# Patient Record
Sex: Male | Born: 1940
Health system: Southern US, Community
[De-identification: ages and names within clinical notes are randomized; demographics above are authoritative.]

## PROBLEM LIST (undated history)

## (undated) DIAGNOSIS — IMO0001 Reserved for inherently not codable concepts without codable children: Secondary | ICD-10-CM

## (undated) DIAGNOSIS — K2100 Gastro-esophageal reflux disease with esophagitis, without bleeding: Secondary | ICD-10-CM

## (undated) DIAGNOSIS — E7800 Pure hypercholesterolemia, unspecified: Secondary | ICD-10-CM

## (undated) DIAGNOSIS — K30 Functional dyspepsia: Secondary | ICD-10-CM

## (undated) DIAGNOSIS — N529 Male erectile dysfunction, unspecified: Secondary | ICD-10-CM

## (undated) DIAGNOSIS — I428 Other cardiomyopathies: Secondary | ICD-10-CM

## (undated) DIAGNOSIS — I447 Left bundle-branch block, unspecified: Principal | ICD-10-CM

## (undated) DIAGNOSIS — M1990 Unspecified osteoarthritis, unspecified site: Secondary | ICD-10-CM

## (undated) DIAGNOSIS — F17200 Nicotine dependence, unspecified, uncomplicated: Secondary | ICD-10-CM

## (undated) DIAGNOSIS — H269 Unspecified cataract: Secondary | ICD-10-CM

## (undated) DIAGNOSIS — N200 Calculus of kidney: Secondary | ICD-10-CM

## (undated) DIAGNOSIS — I5022 Chronic systolic (congestive) heart failure: Secondary | ICD-10-CM

## (undated) DIAGNOSIS — G43909 Migraine, unspecified, not intractable, without status migrainosus: Secondary | ICD-10-CM

## (undated) DIAGNOSIS — C801 Malignant (primary) neoplasm, unspecified: Secondary | ICD-10-CM

## (undated) DIAGNOSIS — T63301A Toxic effect of unspecified spider venom, accidental (unintentional), initial encounter: Secondary | ICD-10-CM

## (undated) DIAGNOSIS — I10 Essential (primary) hypertension: Secondary | ICD-10-CM

## (undated) DIAGNOSIS — C73 Malignant neoplasm of thyroid gland: Secondary | ICD-10-CM

## (undated) DIAGNOSIS — J449 Chronic obstructive pulmonary disease, unspecified: Secondary | ICD-10-CM

## (undated) DIAGNOSIS — E039 Hypothyroidism, unspecified: Secondary | ICD-10-CM

## (undated) DIAGNOSIS — C61 Malignant neoplasm of prostate: Secondary | ICD-10-CM

## (undated) DIAGNOSIS — K219 Gastro-esophageal reflux disease without esophagitis: Secondary | ICD-10-CM

## (undated) DIAGNOSIS — Z5189 Encounter for other specified aftercare: Secondary | ICD-10-CM

## (undated) DIAGNOSIS — I452 Bifascicular block: Secondary | ICD-10-CM

## (undated) HISTORY — DX: Malignant neoplasm of prostate: C61

## (undated) HISTORY — PX: CATARACT EXTRACTION: SUR2

## (undated) HISTORY — DX: Chronic obstructive pulmonary disease, unspecified: J44.9

## (undated) HISTORY — PX: TOTAL KNEE ARTHROPLASTY: SHX125

## (undated) HISTORY — PX: COLONOSCOPY: SHX174

## (undated) HISTORY — DX: Hypothyroidism, unspecified: E03.9

## (undated) HISTORY — PX: ESOPHAGOGASTRODUODENOSCOPY: SHX1529

## (undated) HISTORY — DX: Unspecified osteoarthritis, unspecified site: M19.90

## (undated) HISTORY — PX: THYROIDECTOMY: SHX17

## (undated) HISTORY — PX: NECK SURGERY: SHX720

## (undated) HISTORY — DX: Gastro-esophageal reflux disease with esophagitis: K21.0

## (undated) HISTORY — DX: Male erectile dysfunction, unspecified: N52.9

## (undated) HISTORY — DX: Encounter for other specified aftercare: Z51.89

## (undated) HISTORY — DX: Gastro-esophageal reflux disease without esophagitis: K21.9

## (undated) HISTORY — DX: Unspecified cataract: H26.9

## (undated) HISTORY — PX: NASAL SINUS SURGERY: SHX719

## (undated) HISTORY — DX: Malignant neoplasm of thyroid gland: C73

## (undated) HISTORY — DX: Calculus of kidney: N20.0

## (undated) HISTORY — DX: Left bundle-branch block, unspecified: I44.7

## (undated) HISTORY — DX: Migraine, unspecified, not intractable, without status migrainosus: G43.909

## (undated) HISTORY — DX: Gastro-esophageal reflux disease with esophagitis, without bleeding: K21.00

## (undated) HISTORY — DX: Nicotine dependence, unspecified, uncomplicated: F17.200

## (undated) HISTORY — DX: Bifascicular block: I45.2

## (undated) HISTORY — DX: Essential (primary) hypertension: I10

## (undated) HISTORY — DX: Pure hypercholesterolemia, unspecified: E78.00

## (undated) HISTORY — DX: Chronic systolic (congestive) heart failure: I50.22

## (undated) HISTORY — PX: UPPER GASTROINTESTINAL ENDOSCOPY: SHX188

## (undated) HISTORY — DX: Other cardiomyopathies: I42.8

## (undated) HISTORY — PX: FOOT SURGERY: SHX648

## (undated) HISTORY — PX: PROSTATECTOMY: SHX69

## (undated) HISTORY — PX: CARDIAC CATHETERIZATION: SHX172

## (undated) HISTORY — DX: Malignant (primary) neoplasm, unspecified: C80.1

---

## 1974-10-03 HISTORY — PX: HERNIA REPAIR: SHX51

## 1999-01-13 ENCOUNTER — Other Ambulatory Visit: Admission: RE | Admit: 1999-01-13 | Discharge: 1999-01-13 | Payer: Self-pay | Admitting: *Deleted

## 1999-01-18 ENCOUNTER — Encounter: Admission: RE | Admit: 1999-01-18 | Discharge: 1999-02-22 | Payer: Self-pay | Admitting: Neurosurgery

## 1999-02-17 ENCOUNTER — Other Ambulatory Visit: Admission: RE | Admit: 1999-02-17 | Discharge: 1999-02-17 | Payer: Self-pay | Admitting: *Deleted

## 1999-04-23 ENCOUNTER — Encounter: Payer: Self-pay | Admitting: Surgery

## 1999-04-27 ENCOUNTER — Observation Stay (HOSPITAL_COMMUNITY): Admission: RE | Admit: 1999-04-27 | Discharge: 1999-04-28 | Payer: Self-pay | Admitting: Surgery

## 1999-04-27 ENCOUNTER — Encounter (INDEPENDENT_AMBULATORY_CARE_PROVIDER_SITE_OTHER): Payer: Self-pay

## 2000-02-24 ENCOUNTER — Encounter (INDEPENDENT_AMBULATORY_CARE_PROVIDER_SITE_OTHER): Payer: Self-pay | Admitting: Specialist

## 2000-02-24 ENCOUNTER — Ambulatory Visit (HOSPITAL_COMMUNITY): Admission: RE | Admit: 2000-02-24 | Discharge: 2000-02-24 | Payer: Self-pay | Admitting: *Deleted

## 2000-03-06 ENCOUNTER — Other Ambulatory Visit: Admission: RE | Admit: 2000-03-06 | Discharge: 2000-03-06 | Payer: Self-pay | Admitting: Urology

## 2000-04-17 ENCOUNTER — Encounter: Admission: RE | Admit: 2000-04-17 | Discharge: 2000-07-16 | Payer: Self-pay | Admitting: Radiation Oncology

## 2000-05-08 ENCOUNTER — Encounter: Payer: Self-pay | Admitting: Urology

## 2000-05-10 ENCOUNTER — Encounter (INDEPENDENT_AMBULATORY_CARE_PROVIDER_SITE_OTHER): Payer: Self-pay | Admitting: *Deleted

## 2000-05-10 ENCOUNTER — Inpatient Hospital Stay (HOSPITAL_COMMUNITY): Admission: RE | Admit: 2000-05-10 | Discharge: 2000-05-17 | Payer: Self-pay | Admitting: Urology

## 2000-12-12 ENCOUNTER — Ambulatory Visit (HOSPITAL_COMMUNITY): Admission: RE | Admit: 2000-12-12 | Discharge: 2000-12-12 | Payer: Self-pay | Admitting: *Deleted

## 2000-12-12 ENCOUNTER — Encounter (INDEPENDENT_AMBULATORY_CARE_PROVIDER_SITE_OTHER): Payer: Self-pay

## 2002-09-16 ENCOUNTER — Ambulatory Visit (HOSPITAL_COMMUNITY): Admission: RE | Admit: 2002-09-16 | Discharge: 2002-09-16 | Payer: Self-pay | Admitting: Neurosurgery

## 2003-05-19 ENCOUNTER — Encounter (INDEPENDENT_AMBULATORY_CARE_PROVIDER_SITE_OTHER): Payer: Self-pay | Admitting: Specialist

## 2003-05-19 ENCOUNTER — Ambulatory Visit (HOSPITAL_COMMUNITY): Admission: RE | Admit: 2003-05-19 | Discharge: 2003-05-19 | Payer: Self-pay | Admitting: *Deleted

## 2007-10-04 DIAGNOSIS — I447 Left bundle-branch block, unspecified: Secondary | ICD-10-CM

## 2007-10-04 HISTORY — DX: Left bundle-branch block, unspecified: I44.7

## 2007-11-13 ENCOUNTER — Ambulatory Visit: Payer: Self-pay | Admitting: Cardiovascular Disease

## 2007-11-26 ENCOUNTER — Ambulatory Visit: Payer: Self-pay

## 2007-11-26 ENCOUNTER — Encounter: Payer: Self-pay | Admitting: Cardiovascular Disease

## 2007-12-07 ENCOUNTER — Ambulatory Visit: Payer: Self-pay | Admitting: Cardiovascular Disease

## 2007-12-07 LAB — CONVERTED CEMR LAB
BUN: 15 mg/dL (ref 6–23)
Basophils Absolute: 0 10*3/uL (ref 0.0–0.1)
Basophils Relative: 0.4 % (ref 0.0–1.0)
CO2: 29 meq/L (ref 19–32)
Calcium: 9.5 mg/dL (ref 8.4–10.5)
Chloride: 106 meq/L (ref 96–112)
Creatinine, Ser: 0.8 mg/dL (ref 0.4–1.5)
Eosinophils Absolute: 0.4 10*3/uL (ref 0.0–0.6)
Eosinophils Relative: 4 % (ref 0.0–5.0)
Free T4: 1.4 ng/dL (ref 0.6–1.6)
GFR calc Af Amer: 125 mL/min
GFR calc non Af Amer: 103 mL/min
Glucose, Bld: 92 mg/dL (ref 70–99)
HCT: 43.1 % (ref 39.0–52.0)
Hemoglobin: 14.4 g/dL (ref 13.0–17.0)
INR: 0.9 (ref 0.8–1.0)
Lymphocytes Relative: 30.9 % (ref 12.0–46.0)
MCHC: 33.4 g/dL (ref 30.0–36.0)
MCV: 89.9 fL (ref 78.0–100.0)
Monocytes Absolute: 0.9 10*3/uL — ABNORMAL HIGH (ref 0.2–0.7)
Monocytes Relative: 8.5 % (ref 3.0–11.0)
Neutro Abs: 5.7 10*3/uL (ref 1.4–7.7)
Neutrophils Relative %: 56.2 % (ref 43.0–77.0)
PSA: 0.01 ng/mL — ABNORMAL LOW (ref 0.10–4.00)
Platelets: 236 10*3/uL (ref 150–400)
Potassium: 3.7 meq/L (ref 3.5–5.1)
Prothrombin Time: 11.5 s (ref 10.9–13.3)
RBC: 4.8 M/uL (ref 4.22–5.81)
RDW: 12.1 % (ref 11.5–14.6)
Sodium: 142 meq/L (ref 135–145)
TSH: 0.72 microintl units/mL (ref 0.35–5.50)
WBC: 10.2 10*3/uL (ref 4.5–10.5)
aPTT: 26.8 s (ref 21.7–29.8)

## 2007-12-13 ENCOUNTER — Ambulatory Visit: Payer: Self-pay | Admitting: Cardiovascular Disease

## 2007-12-13 ENCOUNTER — Inpatient Hospital Stay (HOSPITAL_BASED_OUTPATIENT_CLINIC_OR_DEPARTMENT_OTHER): Admission: RE | Admit: 2007-12-13 | Discharge: 2007-12-13 | Payer: Self-pay | Admitting: Cardiovascular Disease

## 2008-01-01 ENCOUNTER — Ambulatory Visit: Payer: Self-pay | Admitting: Cardiovascular Disease

## 2008-07-17 ENCOUNTER — Ambulatory Visit: Payer: Self-pay | Admitting: Cardiovascular Disease

## 2008-12-30 DIAGNOSIS — I10 Essential (primary) hypertension: Secondary | ICD-10-CM | POA: Insufficient documentation

## 2008-12-30 DIAGNOSIS — R0602 Shortness of breath: Secondary | ICD-10-CM

## 2008-12-30 DIAGNOSIS — R918 Other nonspecific abnormal finding of lung field: Secondary | ICD-10-CM

## 2008-12-30 DIAGNOSIS — M129 Arthropathy, unspecified: Secondary | ICD-10-CM | POA: Insufficient documentation

## 2008-12-30 DIAGNOSIS — R0600 Dyspnea, unspecified: Secondary | ICD-10-CM | POA: Insufficient documentation

## 2008-12-30 DIAGNOSIS — E7800 Pure hypercholesterolemia, unspecified: Secondary | ICD-10-CM | POA: Insufficient documentation

## 2008-12-30 DIAGNOSIS — I447 Left bundle-branch block, unspecified: Secondary | ICD-10-CM

## 2008-12-30 DIAGNOSIS — E039 Hypothyroidism, unspecified: Secondary | ICD-10-CM | POA: Insufficient documentation

## 2008-12-30 DIAGNOSIS — I428 Other cardiomyopathies: Secondary | ICD-10-CM

## 2009-01-07 ENCOUNTER — Encounter: Payer: Self-pay | Admitting: Cardiovascular Disease

## 2009-01-07 ENCOUNTER — Ambulatory Visit: Payer: Self-pay | Admitting: Cardiovascular Disease

## 2009-01-20 ENCOUNTER — Ambulatory Visit: Payer: Self-pay

## 2009-01-20 ENCOUNTER — Encounter: Payer: Self-pay | Admitting: Cardiovascular Disease

## 2009-01-22 ENCOUNTER — Ambulatory Visit: Payer: Self-pay | Admitting: Cardiovascular Disease

## 2009-01-22 ENCOUNTER — Observation Stay (HOSPITAL_COMMUNITY): Admission: EM | Admit: 2009-01-22 | Discharge: 2009-01-23 | Payer: Self-pay | Admitting: Emergency Medicine

## 2009-02-04 ENCOUNTER — Encounter (INDEPENDENT_AMBULATORY_CARE_PROVIDER_SITE_OTHER): Payer: Self-pay | Admitting: *Deleted

## 2009-07-08 ENCOUNTER — Ambulatory Visit (HOSPITAL_COMMUNITY): Admission: RE | Admit: 2009-07-08 | Discharge: 2009-07-08 | Payer: Self-pay | Admitting: Orthopedic Surgery

## 2009-11-26 ENCOUNTER — Encounter (INDEPENDENT_AMBULATORY_CARE_PROVIDER_SITE_OTHER): Payer: Self-pay | Admitting: *Deleted

## 2009-12-14 ENCOUNTER — Encounter (INDEPENDENT_AMBULATORY_CARE_PROVIDER_SITE_OTHER): Payer: Self-pay | Admitting: *Deleted

## 2009-12-15 ENCOUNTER — Ambulatory Visit: Payer: Self-pay | Admitting: Internal Medicine

## 2009-12-24 ENCOUNTER — Encounter: Payer: Self-pay | Admitting: Cardiovascular Disease

## 2009-12-29 ENCOUNTER — Encounter: Payer: Self-pay | Admitting: Cardiovascular Disease

## 2010-01-04 ENCOUNTER — Ambulatory Visit: Payer: Self-pay | Admitting: Internal Medicine

## 2010-01-06 ENCOUNTER — Inpatient Hospital Stay (HOSPITAL_COMMUNITY): Admission: RE | Admit: 2010-01-06 | Discharge: 2010-01-08 | Payer: Self-pay | Admitting: Orthopedic Surgery

## 2010-01-08 ENCOUNTER — Encounter: Payer: Self-pay | Admitting: Internal Medicine

## 2010-01-11 ENCOUNTER — Encounter (INDEPENDENT_AMBULATORY_CARE_PROVIDER_SITE_OTHER): Payer: Self-pay | Admitting: Orthopaedic Surgery

## 2010-01-11 ENCOUNTER — Ambulatory Visit: Payer: Self-pay | Admitting: Vascular Surgery

## 2010-01-11 ENCOUNTER — Ambulatory Visit
Admission: RE | Admit: 2010-01-11 | Discharge: 2010-01-11 | Payer: Self-pay | Source: Home / Self Care | Admitting: Orthopaedic Surgery

## 2010-11-04 NOTE — Miscellaneous (Signed)
Summary: [facility] Previsit/prep  Clinical Lists Changes  Medications: Added new medication of MOVIPREP 100 GM  SOLR (PEG-KCL-NACL-NASULF-NA ASC-C) As per prep instructions. - Signed Rx of MOVIPREP 100 GM  SOLR (PEG-KCL-NACL-NASULF-NA ASC-C) As per prep instructions.;  #1 x 0;  Signed;  Entered by: Wyona Almas RN;  Authorized by: Iva Boop MD, [facility];  Method used: Electronically to [facility]. #[id number]*, [address], [place], [place], [place]  [address], Ph: [phone], Fax: [phone] Observations: Added new observation of NKA: T (12/15/2009 8:47)    Prescriptions: MOVIPREP 100 GM  SOLR (PEG-KCL-NACL-NASULF-NA ASC-C) As per prep instructions.  #1 x 0   Entered by:   Wyona Almas RN   Authorized by:   Iva Boop MD, [facility]   Signed by:   Wyona Almas RN on 12/15/2009   Method used:   Electronically to        [facility]. [phone]* (retail)       [address]       [place], [place]  [address]       Ph: [phone]       Fax: [phone]   RxID:   [id number]

## 2010-11-04 NOTE — Letter (Signed)
Summary: [facility] Instructions  [facility]  [address] [place], [place] [address]   Phone: [phone]  Fax: [phone]       Arthur [name]    1941/09/13    MRN: [id number]        Procedure Day Dorna Bloom:  Duanne Limerick  01/04/10     Arrival Time:  7:30AM     Procedure Time:  8:30AM     Location of Procedure:                    Juliann Pares _  [facility] (4th Floor)                        PREPARATION FOR COLONOSCOPY WITH MOVIPREP   Starting 5 days prior to your procedure 12/30/09 do not eat nuts, seeds, popcorn, corn, beans, peas,  salads, or any raw vegetables.  Do not take any fiber supplements (e.g. Metamucil, Citrucel, and Benefiber).  THE DAY BEFORE YOUR PROCEDURE         DATE: 01/03/10  DAY: SUNDAY  1.  Drink clear liquids the entire day-NO SOLID FOOD  2.  Do not drink anything colored red or purple.  Avoid juices with pulp.  No orange juice.  3.  Drink at least 64 oz. (8 glasses) of fluid/clear liquids during the day to prevent dehydration and help the prep work efficiently.  CLEAR LIQUIDS INCLUDE: Water Jello Ice Popsicles Tea (sugar ok, no milk/cream) Powdered fruit flavored drinks Coffee (sugar ok, no milk/cream) Gatorade Juice: apple, white grape, white cranberry  Lemonade Clear bullion, consomm, broth Carbonated beverages (any kind) Strained chicken noodle soup Hard Candy                             4.  In the morning, mix first dose of MoviPrep solution:    Empty 1 Pouch A and 1 Pouch B into the disposable container    Add lukewarm drinking water to the top line of the container. Mix to dissolve    Refrigerate (mixed solution should be used within 24 hrs)  5.  Begin drinking the prep at 5:00 p.m. The MoviPrep container is divided by 4 marks.   Every 15 minutes drink the solution down to the next mark (approximately 8 oz) until the full liter is complete.   6.  Follow completed prep with 16 oz of clear liquid of your choice (Nothing red  or purple).  Continue to drink clear liquids until bedtime.  7.  Before going to bed, mix second dose of MoviPrep solution:    Empty 1 Pouch A and 1 Pouch B into the disposable container    Add lukewarm drinking water to the top line of the container. Mix to dissolve    Refrigerate  THE DAY OF YOUR PROCEDURE      DATE: 01/04/10  DAY: MONDAY  Beginning at 3:30AM (5 hours before procedure):         1. Every 15 minutes, drink the solution down to the next mark (approx 8 oz) until the full liter is complete.  2. Follow completed prep with 16 oz. of clear liquid of your choice.    3. You may drink clear liquids until 6:30AM (2 HOURS BEFORE PROCEDURE).   MEDICATION INSTRUCTIONS  Unless otherwise instructed, you should take regular prescription medications with a small sip of water   as early as possible the morning  of your procedure.         OTHER INSTRUCTIONS  You will need a responsible adult at least 69 years of age to accompany you and drive you home.   This person must remain in the waiting room during your procedure.  Wear loose fitting clothing that is easily removed.  Leave jewelry and other valuables at home.  However, you may wish to bring a book to read or  an iPod/MP3 player to listen to music as you wait for your procedure to start.  Remove all body piercing jewelry and leave at home.  Total time from sign-in until discharge is approximately 2-3 hours.  You should go home directly after your procedure and rest.  You can resume normal activities the  day after your procedure.  The day of your procedure you should not:   Drive   Make legal decisions   Operate machinery   Drink alcohol   Return to work  You will receive specific instructions about eating, activities and medications before you leave.    The above instructions have been reviewed and explained to me by   Wyona Almas RN  December 15, 2009 9:34 AM    I fully understand and can  verbalize these instructions _____________________________ Date _________

## 2010-11-04 NOTE — Letter (Signed)
Summary: Previsit letter  [facility]  [address] [place], [place] [address]   Phone: [phone]  Fax: [phone]       11/26/2009 MRN: [id number]  Arthur [name] [address] [place], [place]  [address]  Dear Arthur [name],  Welcome to the Gastroenterology Division at [facility].    You are scheduled to see a nurse for your pre-procedure visit on 12-15-09 at 9:00a.m. on the 3rd floor at [facility], [address]  We ask that you try to arrive at our office 15 minutes prior to your appointment time to allow for check-in.  Your nurse visit will consist of discussing your medical and surgical history, your immediate family medical history, and your medications.    Please bring a complete list of all your medications or, if you prefer, bring the medication bottles and we will list them.  We will need to be aware of both prescribed and over the counter drugs.  We will need to know exact dosage information as well.  If you are on blood thinners (Coumadin, Plavix, Aggrenox, Ticlid, etc.) please call our office today/prior to your appointment, as we need to consult with your physician about holding your medication.   Please be prepared to read and sign documents such as consent forms, a financial agreement, and acknowledgement forms.  If necessary, and with your consent, a friend or relative is welcome to sit-in on the nurse visit with you.  Please bring your insurance card so that we may make a copy of it.  If your insurance requires a referral to see a specialist, please bring your referral form from your primary care physician.  No co-pay is required for this nurse visit.     If you cannot keep your appointment, please call [phone] to cancel or reschedule prior to your appointment date.  This allows [place] the opportunity to schedule an appointment for another patient in need of care.    Thank you for choosing [facility] for your medical needs.   We appreciate the opportunity to care for you.  Please visit [place] at our website  to learn more about our practice.                     Sincerely.                                                                                                                   The Gastroenterology Division

## 2010-11-04 NOTE — Letter (Signed)
Summary: [facility] Surgical Clearance  [facility] Surgical Clearance   Imported By: Roderic Ovens 03/09/2010 12:19:43  _____________________________________________________________________  External Attachment:    Type:   Image     Comment:   External Document

## 2010-11-04 NOTE — Letter (Signed)
Summary: Patient Notice- Polyp Results  [facility]  [address] [place], [place] [address]   Phone: [phone]  Fax: [phone]        January 08, 2010 MRN: [id number]    THAINE GARRIGA [address] [place], [place]  [address]    Dear Mr. MUNYON,  The polyps removed from your colon were adenomatous. This means that they were pre-cancerous or that  they had the potential to change into cancer over time.  I recommend that you have a repeat colonoscopy in 5 years to determine if you have developed any new polyps over time. If you develop any new rectal bleeding, abdominal pain or significant bowel habit changes, please contact us before then.  In addition to repeating colonoscopy, changing health habits may reduce your risk of having more colon polyps and possibly, colon cancer. You may lower your risk of future polyps and colon cancer by adopting healthy habits such as not smoking or using tobacco (if you do), being physically active, losing weight (if overweight), and eating a diet which includes fruits and vegetables and limits red meat.  Please call us if you are having persistent problems or have questions about your condition that have not been fully answered at this time.  Sincerely,  Iva Boop MD, [facility]  This letter has been electronically signed by your physician.  Appended Document: Patient Notice- Polyp Results letter mailed 4.11.11

## 2010-11-04 NOTE — Letter (Signed)
Summary: [facility] Office Note  [facility]stron[facility] Office Note   Imported By: Roderic Ovens 01/11/2010 13:46:20  _____________________________________________________________________  External Attachment:    Type:   Image     Comment:   External Document

## 2010-11-04 NOTE — Procedures (Signed)
Summary: Colonoscopy  Patient: Arthur [name] Note: All result statuses are Final unless otherwise noted.  Tests: (1) Colonoscopy (COL)   COL Colonoscopy           DONE     [facility]     [address]     [place], [place]  [address]           COLONOSCOPY PROCEDURE REPORT           PATIENT:  [name], [name]  MR#:  [id number]     BIRTHDATE:  [birth year], 68 yrs. old  GENDER:  male     ENDOSCOPIST:  Iva Boop, MD, [facility]     REF. BY:  Robert Bellow, M.D.     PROCEDURE DATE:  01/04/2010     PROCEDURE:  Colonoscopy with biopsy and snare polypectomy     ASA CLASS:  Class II     INDICATIONS:  surveillance and screening, history of pre-cancerous     (adenomatous) colon polyps diminutive adenoma removed 2000     no adenomas 2004     MEDICATIONS:   Fentanyl 75 mcg IV, Versed 7 mg           DESCRIPTION OF PROCEDURE:   After the risks benefits and     alternatives of the procedure were thoroughly explained, informed     consent was obtained.  Digital rectal exam was performed and     revealed that the prostate was surgically absent. and no rectal     masses.   The LB CF-H180AL [id number] endoscope was introduced     through the anus and advanced to the cecum, which was identified     by both the appendix and ileocecal valve, without limitations.     The quality of the prep was excellent, using MoviPrep.  The     instrument was then slowly withdrawn as the colon was fully     examined. Insertion: 2:27 minutes Withdrawal: 15:23 minutes     <<PROCEDUREIMAGES>>           FINDINGS:  Three polyps were found. A 3 mm hepatic flexure polyp,     7-8 mm splenic flexyre polyp and a 3 mm rectal polyp. The 3 mm     polyps were removed using cold biopsy forceps. The 7-8 mm polyp     was snared, then cauterized with monopolar cautery. Retrieval was     successful.   Moderate diverticulosis was found in the sigmoid     colon.  This was otherwise a normal examination of the colon.     Retroflexed  views in the rectum revealed internal hemorrhoids.     The scope was then withdrawn from the patient and the procedure     completed.           COMPLICATIONS:  None     ENDOSCOPIC IMPRESSION:     1) Three polyps removed (minimum 3 mm maximum 7-8 mm)     2) Moderate diverticulosis in the sigmoid colon     3) Internal hemorrhoids     4) Otherwise normal examination, excellent prep           5) Prior adenoma removal 2000, brother had colon cancer in his     48's           RECOMMENDATIONS:     1) No aspirin or NSAID's for 1 week if possible. Coumadin,     Lovenox, heparin ok     2) Take  this report for Dr. Luiz Blare to review as part of your knee     surgery process.           REPEAT EXAM:  In for Colonoscopy, pending biopsy results.           Iva Boop, MD, [facility]           CC:  Robert Bellow, MD     Jodi Geralds, MD     The Patient           n.     Rosalie Doctor:   Iva Boop at 01/04/2010 09:02 AM           Storm Frisk, [id number]  Note: An exclamation mark (!) indicates a result that was not dispersed into the flowsheet. Document Creation Date: 01/04/2010 9:02 AM _______________________________________________________________________  (1) Order result status: Final Collection or observation date-time: 01/04/2010 08:51 Requested date-time:  Receipt date-time:  Reported date-time:  Referring Physician:   Ordering Physician: Stan Head [phone]) Specimen Source:  Source: Launa Grill Order Number: [id number] Lab site:   Appended Document: Colonoscopy     Procedures Next Due Date:    Colonoscopy: 01/2015

## 2010-12-22 LAB — CBC
HCT: 31.5 % — ABNORMAL LOW (ref 39.0–52.0)
MCV: 91.8 fL (ref 78.0–100.0)
Platelets: 221 10*3/uL (ref 150–400)
RBC: 3.09 MIL/uL — ABNORMAL LOW (ref 4.22–5.81)
RBC: 3.45 MIL/uL — ABNORMAL LOW (ref 4.22–5.81)
WBC: 13.6 10*3/uL — ABNORMAL HIGH (ref 4.0–10.5)
WBC: 15.4 10*3/uL — ABNORMAL HIGH (ref 4.0–10.5)

## 2010-12-22 LAB — BASIC METABOLIC PANEL
BUN: 17 mg/dL (ref 6–23)
GFR calc Af Amer: >60 mL/min (ref >60)
GFR calc non Af Amer: >60 mL/min (ref >60)
Potassium: 3.9 mEq/L (ref 3.5–5.1)

## 2010-12-22 LAB — PROTIME-INR
INR: 1.79 — ABNORMAL HIGH (ref 0.00–1.49)
Prothrombin Time: 14.2 seconds (ref 11.6–15.2)
Prothrombin Time: 20.6 seconds — ABNORMAL HIGH (ref 11.6–15.2)

## 2010-12-24 LAB — TYPE AND SCREEN
ABO/RH(D): A NEG
Antibody Screen: NEGATIVE

## 2010-12-24 LAB — COMPREHENSIVE METABOLIC PANEL
ALT: 17 U/L (ref 0–53)
AST: 17 U/L (ref 0–37)
Calcium: 9.7 mg/dL (ref 8.4–10.5)
GFR calc Af Amer: >60 mL/min (ref >60)
Glucose, Bld: 93 mg/dL (ref 70–99)
Sodium: 140 mEq/L (ref 135–145)
Total Protein: 7.1 g/dL (ref 6.0–8.3)

## 2010-12-24 LAB — DIFFERENTIAL
Eosinophils Absolute: 0.3 10*3/uL (ref 0.0–0.7)
Lymphs Abs: 2.9 10*3/uL (ref 0.7–4.0)
Monocytes Relative: 7 % (ref 3–12)
Neutrophils Relative %: 62 % (ref 43–77)

## 2010-12-24 LAB — CBC
MCHC: 33.7 g/dL (ref 30.0–36.0)
RDW: 12.8 % (ref 11.5–15.5)

## 2010-12-24 LAB — URINALYSIS, ROUTINE W REFLEX MICROSCOPIC
Bilirubin Urine: NEGATIVE
Ketones, ur: NEGATIVE mg/dL
Nitrite: NEGATIVE
Protein, ur: NEGATIVE mg/dL
Urobilinogen, UA: 1 mg/dL (ref 0.0–1.0)

## 2010-12-24 LAB — PROTIME-INR: INR: 0.99 (ref 0.00–1.49)

## 2011-01-07 LAB — CBC
Hemoglobin: 13.6 g/dL (ref 13.0–17.0)
Platelets: 238 10*3/uL (ref 150–400)
RDW: 13.1 % (ref 11.5–15.5)

## 2011-01-07 LAB — BASIC METABOLIC PANEL
BUN: 15 mg/dL (ref 6–23)
Calcium: 9.2 mg/dL (ref 8.4–10.5)
Creatinine, Ser: 0.82 mg/dL (ref 0.4–1.5)
GFR calc non Af Amer: >60 mL/min (ref >60)
Glucose, Bld: 101 mg/dL — ABNORMAL HIGH (ref 70–99)
Sodium: 142 mEq/L (ref 135–145)

## 2011-01-12 LAB — URINALYSIS, MICROSCOPIC ONLY
Bilirubin Urine: NEGATIVE
Hgb urine dipstick: NEGATIVE
Ketones, ur: NEGATIVE mg/dL
Nitrite: NEGATIVE
Protein, ur: NEGATIVE mg/dL
Specific Gravity, Urine: 1.008 (ref 1.005–1.030)
Urobilinogen, UA: 1 mg/dL (ref 0.0–1.0)

## 2011-01-12 LAB — COMPREHENSIVE METABOLIC PANEL
ALT: 14 U/L (ref 0–53)
AST: 14 U/L (ref 0–37)
Albumin: 3.3 g/dL — ABNORMAL LOW (ref 3.5–5.2)
Alkaline Phosphatase: 73 U/L (ref 39–117)
BUN: 14 mg/dL (ref 6–23)
Chloride: 106 mEq/L (ref 96–112)
Potassium: 3.8 mEq/L (ref 3.5–5.1)
Sodium: 140 mEq/L (ref 135–145)
Total Bilirubin: 0.7 mg/dL (ref 0.3–1.2)
Total Protein: 5.9 g/dL — ABNORMAL LOW (ref 6.0–8.3)

## 2011-01-12 LAB — CBC
HCT: 37.2 % — ABNORMAL LOW (ref 39.0–52.0)
Hemoglobin: 13.3 g/dL (ref 13.0–17.0)
Platelets: 193 10*3/uL (ref 150–400)
RBC: 4.3 MIL/uL (ref 4.22–5.81)
RDW: 13 % (ref 11.5–15.5)
WBC: 10.7 10*3/uL — ABNORMAL HIGH (ref 4.0–10.5)
WBC: 7.6 10*3/uL (ref 4.0–10.5)

## 2011-01-12 LAB — PROTIME-INR: INR: 1 (ref 0.00–1.49)

## 2011-01-12 LAB — DIFFERENTIAL
Basophils Absolute: 0 10*3/uL (ref 0.0–0.1)
Basophils Relative: 0 % (ref 0–1)
Eosinophils Absolute: 0.3 10*3/uL (ref 0.0–0.7)
Eosinophils Relative: 4 % (ref 0–5)
Lymphocytes Relative: 19 % (ref 12–46)
Monocytes Absolute: 0.5 10*3/uL (ref 0.1–1.0)
Monocytes Absolute: 0.6 10*3/uL (ref 0.1–1.0)
Monocytes Relative: 5 % (ref 3–12)
Monocytes Relative: 7 % (ref 3–12)
Neutro Abs: 4 10*3/uL (ref 1.7–7.7)
Neutro Abs: 7.9 10*3/uL — ABNORMAL HIGH (ref 1.7–7.7)

## 2011-01-12 LAB — POCT CARDIAC MARKERS
CKMB, poc: <1 ng/mL — ABNORMAL LOW (ref 1.0–8.0)
CKMB, poc: <1 ng/mL — ABNORMAL LOW (ref 1.0–8.0)
Myoglobin, poc: 71.2 ng/mL (ref 12–200)
Troponin i, poc: <0.05 ng/mL (ref 0.00–0.09)

## 2011-01-12 LAB — POCT I-STAT, CHEM 8
BUN: 16 mg/dL (ref 6–23)
Creatinine, Ser: 0.9 mg/dL (ref 0.4–1.5)
Hemoglobin: 13.6 g/dL (ref 13.0–17.0)
Potassium: 3.9 mEq/L (ref 3.5–5.1)
Sodium: 139 mEq/L (ref 135–145)

## 2011-01-12 LAB — CULTURE, BLOOD (SINGLE): Culture: NO GROWTH

## 2011-01-12 LAB — CK TOTAL AND CKMB (NOT AT ARMC)
CK, MB: 1.8 ng/mL (ref 0.3–4.0)
Total CK: 113 U/L (ref 7–232)
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
## 2011-02-15 NOTE — Assessment & Plan Note (Signed)
[facility]                            CARDIOLOGY OFFICE NOTE   NAME:[name], Arthur STGERMAIN                       MRN:          [id number]  DATE:01/01/2008                            DOB:          1941-01-29    Arthur [name] returns today for follow-up.  He had an abnormal Myoview with  evidence of left bundle branch block and cardiomyopathy.  His heart  catheterization showed it was nonischemic.  Filling pressures were fine.  Since I last saw him he unfortunately continues to smoke.  We discussed  this.  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  He did not want to try Chantix.   He needs to get some baseline PFT's at Urgent Care with Dr. Perrin Maltese.  From  a cardiac perspective we will maintain him on lisinopril only.  He does  not need a diuretic or beta blocker at this point particularly since he  does not have coronary disease.   REVIEW OF SYSTEMS:  Remarkable for arthritis particularly in his knees  and shoulder.  He was started on tramadol and has had some stomach upset  with it.  I told him to ask Dr. Perrin Maltese about possible Celebrex or Mobic.   REVIEW OF SYSTEMS:  Otherwise, negative.   MEDICATIONS:  1. Advair 250/50.  2. Levoxyl 200 mcg.  3. Simvastatin 40 a day.  4. Lisinopril 20 a day.  5. Tramadol 2-3 a day.   PHYSICAL EXAMINATION:  GENERAL:  Remarkable for elderly white male in no  distress.  VITAL SIGNS:  Weight 215, blood pressure 128/80, pulse 68, afebrile,  respiratory rate 14.  HEENT:  Unremarkable.  NECK:  Carotids normal without bruit, no lymphadenopathy, thyromegaly,  JVP elevation.  LUNGS:  With clear diaphragmatic motion.  No wheezing.  HEART:  S1, S2 with normal heart sounds.  PMI normal.  ABDOMEN:  Benign.  Bowel sounds positive.  No AAA, no tenderness, no  bruit, no hepatosplenomegaly, hepatic jugular reflux.  Distal pulses are  intact with no edema.  NEUROLOGICAL:  Nonfocal.  SKIN:  Warm and dry.  MUSCULOSKELETAL:  No muscular weakness.  Cath site well-healed without  residual bruit or hematoma.   IMPRESSION:  1. Left bundle branch block.  No evidence of high grade heart block,      no syncope.  2. Nonischemic cardiomyopathy.  Continue ACE inhibitor.  He will      follow up left ventricular function in six months to a year.  3. Hypercholesterolemia.  Continue simvastatin 40 a day.  4. Smoking.  Encourage efforts to stop smoking, possible use of      Nicorette gum as opposed to Chantix.  5. Arthritis.  Switch from tramadol to Mobic or Celebrex per Dr.      Perrin Maltese.  6. Chronic obstructive pulmonary disease.  Continue Advair 250/50.  7. Hypothyroidism.  Continue Synthroid 200 mcg, TSH, T4 in six months.     Arthur [name]. [name], MD, [facility]  Electronically Signed    PCN/MedQ  DD: 01/01/2008  DT: 01/01/2008  Job #:  [id number] 
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
## 2011-02-15 NOTE — Cardiovascular Report (Signed)
NAMECANTON, YEARBY                ACCOUNT NO.:  [id number]   MEDICAL RECORD NO.:  [id number]          PATIENT TYPE:  OIB   LOCATION:  1965                         FACILITY:  [facility]   PHYSICIAN:  Peter C. [name], MD, FACCDATE OF BIRTH:  Jul 05, 1941   DATE OF PROCEDURE:  DATE OF DISCHARGE:                            CARDIAC CATHETERIZATION   INDICATION:  Left bundle branch block, abnormal Myoview suggesting  cardiomyopathy, EF 40%.  The patient is having dyspnea.   Cine catheterization done with 5-French catheter from right femoral  artery and a 7-French catheter from right femoral vein.   Right heart pressures were normal.  Mean right atrial pressure was 12.  RV pressure was 35/14, PA pressure was 28/15, mean pulmonary capillary  wedge pressure was 18.   Aortic pressure was 127/70, LV pressure was 128/19.   RAO ventriculography showed mild global hypokinesis, somewhat worse in  the anterior apex.  EF was about 45%.   Coronary arteriography:  The patient had a large left dominant coronary  circulation.   Left main coronary artery was normal.   The left anterior descending artery was normal.   The first diagonal branch was large and normal, second diagonal branch  was somewhat diminutive and normal.   The patient had a large intermediate branch which was normal.  The  circ  was large and left dominant.  There were 2 low-lying obtuse marginal  branches, PDA and PLA, which were normal.   Right coronary artery was small and nondominant.  It was normal.   IMPRESSION:  The patient would appear to have a left bundle branch block  with nonischemic cardiomyopathy.  He will continue his current medical  therapy, including ACE inhibitor.  His filling pressures are fine.  I  will see him back in the office in 3 to 4 weeks to titrate his  medications.      Noralyn Pick. [name], MD, [facility]  Electronically Signed     PCN/MEDQ  D:  12/13/2007  T:  12/14/2007  Job:  [id number]

## 2011-02-15 NOTE — H&P (Signed)
NAMEBERN, FARE NO.:  [id number]   MEDICAL RECORD NO.:  [id number]          PATIENT TYPE:  OBV   LOCATION:  4736                         FACILITY:  [facility]   PHYSICIAN:  Veverly Fells. Excell Seltzer, MD  DATE OF BIRTH:  19-Apr-1941   DATE OF ADMISSION:  01/22/2009  DATE OF DISCHARGE:                              HISTORY & PHYSICAL   REASON FOR ADMISSION:  Chest pain.   HISTORY OF PRESENT ILLNESS:  [name] is a 70 year old gentleman with  a history of left bundle-branch block and nonischemic cardiomyopathy.  He underwent cardiac catheterization in March 2009 demonstrating mild LV  dysfunction with normal coronary arteries.  He presented to the  emergency room today because of chest pain.  He began feeling poorly  yesterday, as he describes dizziness, weakness, shortness of breath, and  chest discomfort.  He woke up this morning and experienced more  significant chest pain and now that he describes as a tightness.  The  pain is nonradiating.  He has not had similar pains in the past.  At  present, his pain is very mild.  He has been started on IV nitroglycerin  in the emergency room, but it has not affected his chest pain.  His  initial EKG shows his chronic left bundle-branch block.  His initial  cardiac enzymes are negative.   He also complains of nausea and vomiting and it has occurred within the  last 2 days.  He had a drenching sweat last night in the middle of the  night.  He denies chills or fevers.  He denies cough, orthopnea, PND, or  edema.  He notes that he had a spider bite on the back of the right leg  late last week and was started on doxycycline 2 days ago.   PAST MEDICAL HISTORY:  1. Nonischemic cardiomyopathy with LVEF 40-45%.  2. [name].  3. Hypothyroidism.  4. Hernia repair.  5. Previous neck surgery.  6. History of thyroid cancer.   SOCIAL HISTORY:  The patient has been previously married and is a  widower of 3 years.  He  has a grown son.  He is a long-time cigarette  smoker.  He does not drink alcohol.  He is to farm and operate heavy  equipment.   FAMILY HISTORY:  Father died at age 7 of a myocardial infarction.  Mother is still alive.   MEDICATIONS:  1. Simvastatin 40 mg at bedtime.  2. Doxycycline 20 mg b.i.d.  3. Tramadol 50 mg 1-2 every 8 hours as needed.  4. Levothyroxine 200 mcg daily.  5. Lisinopril 20 mg daily.  6. Xanax 0.25 mg q.4 h. p.r.n. anxiety.  7. Advair 250/50 mcg b.i.d.   ALLERGIES:  NKDA.   REVIEW OF SYSTEMS:  A complete 12-point review of systems was performed.  Review of systems was negative except as outlined in the HPI.   PHYSICAL EXAMINATION:  GENERAL:  The patient is alert and oriented.  He  is in no acute distress, but he does appear fatigued.  VITAL SIGNS:  Blood pressure is 120/70,  heart rate 60, respiratory rate  16, weight is 201 pounds.  HEENT:  Normal.  NECK:  Normal carotid upstrokes.  No bruits.  JVP normal.  No  thyromegaly or thyroid nodules.  LUNGS:  Scattered rhonchi with chest has some equal expansion.  CARDIOVASCULAR:  The heart is regular rate and rhythm.  Heart sounds are  distant.  There are no murmurs or gallops.  ABDOMEN:  Soft, nontender.  No organomegaly.  BACK:  No CVA tenderness.  EXTREMITIES:  No clubbing, cyanosis, or edema.  Peripheral pulses are 2+  and equal throughout.  SKIN:  Warm and dry without rash.  NEUROLOGIC:  Cranial nerves II through XII are intact.  Strength is  intact and equal bilaterally.   EKG shows normal sinus rhythm with left bundle-branch block.  Heart rate  is 53 beats per minute.   Chest x-ray shows changes consistent with COPD.  There are no acute  infiltrates.  Point-of-care labs show hemoglobin 13.6.  Sodium 139,  potassium 3.9, glucose 106, chloride 104, BUN 16, creatinine 0.9.  Initial cardiac markers are negative.  White blood cell count 10.7,  platelet count 197,000, 73% neutrophils, 19% lymphocytes, 5%  monocytes,  2% eosinophils.   ASSESSMENT:  A 70 year old gentleman with chest pain and multiple  associated symptoms.  His risk for myocardial infarction is very low in  the setting of normal coronary arteries 1 year ago.  We will rule out  for myocardial infarction with serial cardiac biomarkers.  We will  discontinue IV nitroglycerin.  In the setting of shortness of breath, we  will check a D-dimer to screen for pulmonary embolism.  I think the  chance of this is also low.  It is possible that his symptoms are  related to taking doxycycline, as they started the following morning  after he began taking this medication.  We will hold the medicine and  see how he responds clinically.  We will check a urinalysis and repeat a  CBC with differential in the morning to rule out infection.  It is  possible that he has a viral syndrome based on his myriad of symptoms.   Further plans pending results of further lab work and the patient's  clinical course.  If he improves overnight and further evaluation is  unrevealing, consider early discharge tomorrow.      Veverly Fells. Excell Seltzer, MD  Electronically Signed     MDC/MEDQ  D:  01/22/2009  T:  01/23/2009  Job:  [id number]   cc:   Noralyn Pick. [name], MD, [facility]

## 2011-02-15 NOTE — Discharge Summary (Signed)
Arthur [name], Arthur [name] NO.:  [id number]   MEDICAL RECORD NO.:  [id number]          PATIENT TYPE:  OBV   LOCATION:  4736                         FACILITY:  [facility]   PHYSICIAN:  Jonelle Sidle, MD DATE OF BIRTH:  07-20-1941   DATE OF ADMISSION:  01/22/2009  DATE OF DISCHARGE:  01/23/2009                               DISCHARGE SUMMARY   PRIMARY CARDIOLOGIST:  Theron Arista C. [name], MD, [facility]   DISCHARGE DIAGNOSIS:  Chest pain.   SECONDARY DIAGNOSES:  1. Probable viral gastroenteritis.  2. Nonischemic cardiomyopathy.  3. Hyperlipidemia.  4. Hypothyroidism.  5. History of recent spider bite, which was treated with doxycycline.  6. Status post hernia repair.  7. Status post neck surgery.  8. History of thyroid cancer.  9. Tobacco abuse.   ALLERGIES:  No known drug allergies.   PROCEDURES:  None.   HISTORY OF PRESENT ILLNESS:  A 70 year old male with prior history of  nonischemic cardiomyopathy and chronic left bundle-branch block who was  in his usual state of health until the day prior to admission when he  began to experience dizziness, weakness, dyspnea, and mild chest  discomfort.  He also experienced nausea and vomiting.  Approximately 1-  week prior to admission, he noted a spider bite on the back of his right  leg.  He started on doxycycline by his urgent care physician.  Symptoms  started after initiating doxycycline therapy.  On the morning of  admission, the patient experienced chest pain and tightness and  presented to the [facility] ED secondary to his constellation of  symptoms.  In the ED, his ECG showed chronic left-bundle branch block.  No acute changes.  Lab work was unrevealing.  He was admitted for  observation.   HOSPITAL COURSE:  The patient has had no recurrence of chest discomfort.  He has been hydrated overnight and has been feeling better.  His cardiac  markers are negative and he exhibits no leukocytosis.  At this time,  urinalysis  and blood cultures are pending.  The patient wishes to go  home and this appears to be reasonable.  We have discontinued  doxycycline therapy as it may be contributing to his symptoms.  We  recommend that he follow up with his urgent care physician in the next  week.   DISCHARGE LABORATORY DATA:  Hemoglobin 13.0, hematocrit 37.2, WBC is  7.6, and platelets 193.  Sodium 140, potassium 3.8, chloride 106, CO2  27, BUN 14, creatinine 0.78, glucose 86, total bilirubin 0.7, alkaline  phosphatase 73, AST 14, ALT 14, total protein 5.9, albumin 3.3, calcium  9.1, CK 113, MB 120, and troponin I is less than 0.01.   DISPOSITION:  The patient will be discharged home today in good  condition.   FOLLOWUP PLANS AND APPOINTMENTS:  We have asked him to follow up with  his primary care Tienna Bienkowski in the next week.  He will follow up with Dr.  [name] as previously scheduled.   DISCHARGE MEDICATIONS:  1. Synthroid 200 mcg daily.  2. Advair 250/50 one puff b.i.d.  3. Lisinopril 20  mg daily.  4. Tramadol 50 mg 1-2 tablets q.8 h. p.r.n.  5. Simvastatin 40 mg at bedtime.  6. Maxalt 10 mg p.r.n. migraine headaches.   OUTSTANDING LAB STUDIES:  UA and blood cultures are pending.   DURATION OF DISCHARGE/ENCOUNTER:  45 minutes including physician time.      Nicolasa Ducking, ANP      Jonelle Sidle, MD  Electronically Signed    CB/MEDQ  D:  01/23/2009  T:  01/24/2009  Job:  [id number]

## 2011-02-15 NOTE — Assessment & Plan Note (Signed)
[facility]                            CARDIOLOGY OFFICE NOTE   NAME:Arthur [name], Arthur [name]                       MRN:          [id number]  DATE:07/17/2008                            DOB:          [birth year]    [name] returns today for followup.  He was initially referred to me  for a left bundle-branch block and dyspnea.  His echocardiogram showed  an EF of 40%.  He subsequently had a heart catheterization which was  performed on December 13, 2007.  At that time, he had no significant  coronary artery disease.  He had a large left dominant coronary  circulation.  Right heart pressures showed a mean right atrial pressure  of 12, RV pressure 35/14, PA pressure 28/50, mean wedge pressure was 18  which appeared to have a nonischemic cardiomyopathy.  He has been doing  well.  He is functional class I.  His dyspnea is related to long-term  smoking.  I again addressed this with him at length.  He is not using  the Nicorette gum and is not motivated to quit.  I explained to him the  long-term deleterious effects in regards to his already significant  COPD, risk for influenza, pneumonia, and vascular disease.   His review of systems is otherwise negative and particularly he has not  had a cough.  His chest x-ray done last year showed no evidence of  cancer.   He is on Advair 250/50, Levoxyl 300 mcg a day, simvastatin 40 a day,  lisinopril 20 a day, tramadol p.r.n.   PHYSICAL EXAMINATION:  GENERAL:  Remarkable for an elderly white male in  no distress.  He does have a bit of a bronchitic voice.  VITAL SIGNS:  Weight is 201, blood pressure 130/60, pulse 60 and  regular, respiratory rate 14, afebrile.  HEENT:  Unremarkable.  NECK:  Carotids are normal without bruit.  No lymphadenopathy,  thyromegaly, JVP elevation.  LUNGS:  No active wheezing.  Good diaphragmatic motion with decreased  air sounds.  S1 and S2.  Normal heart sounds.  PMI normal.  ABDOMEN:  Benign.   Bowel sounds positive.  No AAA.  No bruit.  No  hepatosplenomegaly or hepatojugular reflux.  No tenderness.  EXTREMITIES:  Distal pulses are intact.  No edema.  NEUROLOGIC:  Nonfocal.  SKIN:  Warm and dry.  MUSCULOSKELETAL:  No muscular weakness.   IMPRESSION:  1. Nonischemic myopathy.  Continue current dose of lisinopril.  Does      not appear to be volume overloaded given chronic obstructive      pulmonary disease and baseline resting heart rate in the 60s.  We      will not add beta-blocker.  2. Chronic obstructive pulmonary disease.  Continue to encourage      smoking cessation.  Continue Advair.  3. Hypothyroidism.  Continue Levoxyl, TSH, and T4 in 6 months.  4. Hypertension.  Currently well controlled.  Continue lisinopril.     Noralyn Pick. [name], MD, [facility]  Electronically Signed    PCN/MedQ  DD: 07/17/2008  DT: 07/18/2008  Job #:  [id number] 

## 2011-02-18 NOTE — Procedures (Signed)
[facility]  Patient:    Arthur [name], Arthur [name]                       MRN: [id number] Proc. Date: 12/12/00 Adm. Date:  [id number] Attending:  Sabino Gasser CC:         Jerl Mina, M.D.   Procedure Report  PROCEDURE PERFORMED:  Upper endoscopy.  ENDOSCOPIST:  Sabino Gasser, M.D.  INDICATIONS:  Question of Barretts esophagus seen previously.  ANESTHESIA:  Demerol 70 mg and Versed 7 mg.  DESCRIPTION OF PROCEDURE:  With the patient mildly sedated in the left lateral decubitus position, the Olympus videoscopic endoscope was inserted and passed under direct vision through the esophagus.  The distal esophagus was approached and appeared to be somewhat inflamed, but no clear evidence of Barretts was seen on this exam, but biopsies were taken, nonetheless of areas that conceiveably could be Barretts.  We then entered into the stomach.  The fundus, body, antrum, duodenal bulb, and second portion of the duodenum all appeared normal.  From this point, the endoscope was slowly withdrawn taking circumferential views of the entire duodenal mucosa to the endoscope and pulled back and the stomach placed on retroflexion views from below.  A hiatal hernia was seen as evidenced by an incomplete wrap of the GE junction around the endoscope.  The endoscope was then straightened and withdrawn, taking circumferential views of the entire gastric and subsequently esophageal mucosa, which otherwise appeared normal.  The patients vital signs and pulse oximeter remained stable.  The patient tolerated the procedure well without apparent complications.  FINDINGS:  Changes of esophagitis to the distal esophagus.  Await biopsy report.  PLAN:  The patient will call me for results and follow up with me as an outpatient. DD:  12/12/00 TD:  12/13/00 Job: [id number]

## 2011-02-18 NOTE — Procedures (Signed)
[facility]  Patient:    Arthur [name], Arthur [name]                  MRN: [id number] Adm. Date:  [id number] Disc. Date: [id number] Attending:  Sabino Gasser CC:         Dr. Robert Bellow, [facility]                           Procedure Report  PROCEDURE:  Upper endoscopy.  INDICATIONS:  Reflux esophagitis, heme-positive stools, rule out Barretts esophagus.  ANESTHESIA:  Demerol 75 mg, Versed 8 mg was given intravenously in divided doses.  DESCRIPTION OF PROCEDURE:  With patient mildly sedated in the left lateral decubitus position, the Olympus videoscopic endoscope was inserted in the mouth, passed under direct vision through the esophagus easily.  The distal esophagus was approached, and there were areas of Barretts esophagus seen endoscopically.  These were photographed and biopsied.  We entered into the stomach.  The fundus, body, and antrum showed mild erythema.  This was photographed, especially in the antrum, and biopsied. We entered into the duodenal bulb, second portion of duodenum, both of which appeared normal and were photographed.  From this point, the endoscope was slowly withdrawn, taking circumferential views of the entire duodenal mucosa.  The endoscope then pulled back into the stomach, placed in retroflexion to view the stomach from below, and a hiatal hernia was seen and photographed.  The endoscope was then straightened and pulled back from distal to proximal stomach, taking circumferential views of the remaining gastric and subsequently esophageal mucosa, which otherwise appeared normal.  The patients vital signs and pulse oximeter remained stable.  The patient tolerated the procedure well without apparent complications.  FINDINGS:  Barretts esophagus above a hiatal hernia, biopsied, and erythema of antrum, biopsied.  Await biopsy report.  The patient will call me for results and follow up with me as an outpatient.  Will start  patient on a proton pump inhibitor at this point. DD:  02/24/00 TD:  02/28/00 Job: [id number]

## 2011-02-18 NOTE — Procedures (Signed)
[facility]  Patient:    Arthur [name], Arthur [name]                  MRN: [id number] Proc. Date: 02/24/00 Adm. Date:  [id number] Disc. Date: [id number] Attending:  Sabino Gasser CC:         Dr. Robert Bellow, [facility]                           Procedure Report  PROCEDURE:  Colonoscopy.  INDICATIONS:  Hemoccult positivity, family history of colon cancer in his brother.  ANESTHESIA:  Demerol 50 mg, Versed 5 mg was given intravenously in divided doses.  DESCRIPTION OF PROCEDURE:  With the patient mildly sedated in the lateral decubitus position, the Olympus videoscopic colonoscope was inserted in the rectum after rectal examination was performed, which was unremarkable, and passed under direct vision to the cecum.  Cecum identified by the ileocecal valve and appendiceal orifice, both of which were photographed.  We then entered into the terminal ileum via the ileocecal valve, and this too appeared normal and was photographed.  From this point, the colonoscope was slowly withdrawn, taking circumferential views of the entire colonic mucosa, stopping first in the proximal transverse colon, where a pedunculated polyp was seen, photographed, and using snare cautery technique setting of 20-20 blended current, it was removed and tissue was obtained.  The polyp was obtained in toto for histology.  The endoscope was then further withdrawn, again stopping only at the sigmoid colon area, at which point a second polyp was seen.  It had a wide base, approximately 5 mm in diameter, and it too was removed using hot biopsy forceps and snare cautery technique combined, both with a setting of 20-20 blended current.  The colonoscope was then withdrawn to the rectum, which appeared normal on direct view and showed hemorrhoids on retroflex view. The endoscope was straightened and withdrawn.  The patients vital signs and pulse oximetry remained stable.  The patient  tolerated the procedure well without apparent complications.  FINDINGS:  Polyps as described above, internal hemorrhoids, otherwise unremarkable colonoscope examination to the terminal ileum.  PLAN:  Will have patient call me for results of biopsy reports and follow up with me as an outpatient. DD:  02/24/00 TD:  02/28/00 Job: [id number]

## 2011-02-18 NOTE — Discharge Summary (Signed)
[place]  Patient:    Arthur [name], Arthur [name]                       MRN: [id number] Adm. Date:  [id number] Disc. Date: [id number] Attending:  Monica Becton                           Discharge Summary  HISTORY OF PRESENT ILLNESS:  This is a 70 year old man diagnosed with a Gleason grade 6 adenocarcinoma.  His PSA was 3.3.  Following consultation with me in the office and a radiology consultation, they decided to go ahead with surgery.  The patient otherwise is in good health.  He is a cigarette smoker. He quit about three months ago.  His preoperative chest x-ray suggested COPD. Otherwise basically in good health  ALLERGIES:  No drug allergies.  HOSPITAL COURSE:  He was given a bowel prep preoperatively and came in on May 10, 2000.  He underwent a radical retropubic prostatectomy and lymph node dissection.  Postoperatively, the patient did well for the first two days and then developed some ileus and mild distention.  Then his hemoglobin gradually drifted down.  It got in the 7 range.  At that point, Verl Dicker, M.D., who was covering for me over the weekend gave him three units of packed red blood cells.  His strength improved, although in the interim, he still had a persistent ileus which gradually resolved with time and Dulcolax suppositories.  His JP drain never did drain much the old course.  I removed it on the fifth postoperative day.  Also, he developed some rather impressive scrotal swelling, which I though was third space loss, which hopefully will resolve spontaneous.  He was not ready for discharge, however, until the seventh postoperative day when he felt he was ready to go.  The catheter was draining well.  The incision was healing nicely.  He was eating satisfactorily.  He still had some swelling, which we will treat with scrotal elevation.  I gave him Lasix 20 mg prior to discharge to see if that would bring about some  reduction in the edema, although I think it will take time.  LABORATORY DATA:  His EKG was unremarkable.  His pathology report showed negative lymph nodes.  He had a Gleason grade 6 carcinoma involving both lobes with a positive margin at the left apex.  Other margins were negative for tumor.  His postoperative hemoglobin stabilized at about 9.2, hematocrit 26.8, and white cell count normal at 8900 one day prior to discharge.  We checked his electrolytes postoperatively, which remained normal as did his BUN and creatinine.  FINAL DIAGNOSIS:  Gleason grade 6 adenocarcinoma of the prostate gland, pathologic stage PT IIB with positive ink margin at the left apex.  OPERATION:  Radical retropubic prostatectomy plus pelvic lymph node dissection.  COMPLICATIONS:  1. Postoperative anemia presumably secondary to a pelvic hematoma.  2. Pelvic hematoma.  3. Prolonged postoperative ileus probably secondary to pelvic hematoma.  CONDITION ON DISCHARGE:  Improving.  DISCHARGE MEDICATIONS: 1. Tylox p.r.n. pain. 2. Iron one t.i.d. 3. Levbid for spasm.  No antibiotics for now.  DIET:  Regular.  Force fluids.  ACTIVITY:  Limited.  Scrotal elevation.  Catheter to leg bag.  FOLLOW-UP:  To see me in the office in six days for follow-up and skin staple removal.  They will call me in the meantime for  any fever or increased scrotal swelling. DD:  05/17/00 TD:  05/17/00 Job: [id number]

## 2011-02-18 NOTE — Op Note (Signed)
   NAME:  Arthur [name], Arthur [name]                          ACCOUNT NO.:  [id number]   MEDICAL RECORD NO.:  [id number]                   PATIENT TYPE:  AMB   LOCATION:  ENDO                                 FACILITY:  [facility]   PHYSICIAN:  Georgiana Spinner, M.D.                 DATE OF BIRTH:  [birth year]   DATE OF PROCEDURE:  05/19/2003  DATE OF DISCHARGE:                                 OPERATIVE REPORT   PROCEDURE:  Colonoscopy with polypectomy and biopsy.   ENDOSCOPIST:  Georgiana Spinner, M.D.   INDICATIONS:  Colon polyps.   ANESTHESIA:  Demerol 70 mg, Versed 8 mg.   DESCRIPTION OF PROCEDURE:  With the patient mildly sedated in the left  lateral decubitus position, the Olympus videoscopic colonoscope was inserted  in the rectum and passed under direct vision to the cecum, identified by  ileocecal valve and appendiceal orifice, both of which were photographed.  From this point, the colonoscope was slowly withdrawn taking circumferential  views of the entire colonic mucosa stopping first in the sigmoid colon where  a polyp approximately 1 cm in size was photographed and removed using snare  cautery technique on a setting of 20/20 blended current.  The tissue was  retrieved through suctioning into the endoscope.  We stopped next at  approximately 20 cm from the anal verge at which point another polyp was  seen.  This one was flat, less than 1 cm in size, and it was removed using  hot biopsy forceps technique, again the same 20/20 blended current.  In the  rectum, the endoscope was placed in the retroflexed view to view the anal  canal from above.  Hemorrhoids were seen and photographed.  The endoscope  was straightened and withdrawn.  The patient's vital signs and pulse  oximeter remained stable.  The patient tolerated the procedure well without  apparent complications.   FINDINGS:  1. Internal hemorrhoids.  2. Polyps as described above.   PLAN:  I will have the patient call me for results of  biopsy and follow up  with me as an outpatient.                                               Georgiana Spinner, M.D.    GMO/MEDQ  D:  05/19/2003  T:  05/19/2003  Job:  [id number]   cc:   Jonita Albee, M.D.  [facility]  [address]  [place]  [place] [address]  Fax: [phone]

## 2011-02-18 NOTE — Op Note (Signed)
[place]. [facility]  Patient:    Arthur [name], Arthur [name]                       MRN: [id number] Proc. Date: 05/10/00 Adm. Date:  [id number] Attending:  Monica Becton                           Operative Report  PREOPERATIVE DIAGNOSIS:  Adenocarcinoma of the prostate gland.  POSTOPERATIVE DIAGNOSIS:  Adenocarcinoma of the prostate gland.  OPERATION:  Radical retropubic prostatectomy plus bilateral lymph node pelvic dissection, lymphadenectomy.  SURGEON:  Claudette Laws, M.D.  DESCRIPTION OF PROCEDURE:  The patient was prepped and drapedin a supine position with the kidney rest slightly elevated and the table flexed.  A #20 French 30 cc Foley was placed.  Lower midline incision was made and carried down through the linea alba.  Retroperitoneal space was entered, developed, both iliac veins were identified.  A Buchwalter retractor was brought into the operative field, and exposure was started onthe patients right side.  The lymph nodes along the iliac vein was takendown proximally, and then circumferentially around the vein down to the obturator nerve which was identified and preserved.  There was not much tissue present, we thought no obvious lymphadenopathy.  All bleeders were hemoclipped.  Our attention was then turned to the opposite side where a similar pelvic lymph node dissection was carried out without incident.  Again, the obturator nerve was identified and preserved.  Dissections weresent for permanent examination.  Our attention was then turned to theendopelvic fascia which was opened bilaterally with the cautery.  There was some trouble with some bleeding initially just lateral to the dorsal vein complex which we eventually controlled with suture ligatures of 2-0 Vicryl.  Puboprostatic ligaments were taken down under direct vision, andthen a plane was developed anterior to the urethra and below the dorsal vein complex.  Hohenfelner clamp was passed.   The dorsal vein complex wassecured with two Vicryl sutures, and then a #1 Vicryl on a CT needle.  He had a very acute angle making separation of the apex of the prostate somewhat difficult.  We cut the dorsal vein complex, the bleeding seemed tobe under control at this point.  We then identified a urethra and cut the anterior portion with the knife and then Metzenbaum scissors.  We brought out the Foley catheter with the right angle clamp.  It was clamped proximally.  The distal end was cut and brought out through the anterior urethra.  Right angle clamp was then passed below the posterior urethra which was then cut under direct vision with Metzenbaum scissors.  It was allowed to retract back up under the pubic bone.  We tried to free up the rectourethralis muscle, and we had a difficult time identifying the proper plane at this point.  So then we turned our attention to the bladder neck area where two Alice clamps were placed, and we slowly developed the plane between the bladder neck and the prostate.  We went down around the bladder neck area and then made an incision in the posterior bladder wall, again separating out the posterior bladder wall from the prostate.  At this point we took down the lateral prostatic pedicles with right angle clamp and0 silk tied sutures. Gradually, we were able to free up the prostate and then went back to the apex where we were able to cut through the  rectourethralis muscle, free up the apex, develop a plane posteriorly.  Then the final attachments, the seminal vesicles were identified anteriorly, cross clamped, cut, and secured with 0 silk ties.  Prostate was removed thenwith the seminal vesicles and the ampulla of the vas.  The wound was irrigated with copious amounts of saline. There were a few bleeders which we electrocoagulated.  We had to reconstruct a rather large bladder neck with interrupted and running 2-0 chromic suture. The ureteral orifices  were identified.  Endocarmine was injected, and we intubated the right ureteral orifice since it was very close to the line of incision.  We closed the bladder neck in a tennis racquet fashion to a point where it would admit the tip of our index finger.  We then passed a Grunwald retractor sound, and under direct vision using 2-0 Vicryl on a double-arm needle, put in five sutures, at approximately the 5 and 7 oclock position, 3 and 9 oclock position, and one at the 12 oclock position.  A #20 French 10 cc Foley catheter was placed, and then we finished the anastomosis by passing the other end of the Vicryl inside out through the bladder neck area.  The bladder was then brought down to the urethral stump and tied underdirect vision.  A #10 [place] JP drain was brought out through the patients right lower abdomen, secured to the skin with a 3-0 nylon. Foley catheter seemed to irrigate well.  There was slight leakage, but distraction would stop the leakage.  The arm of the catheter was secured with the silk suture, so the balloon would not inadvertently deflate.  The wound was irrigated with saline.  There appeared to be no active bleeders.  We closed the rectus fascia with a running #1 PDS suture, skin with staples, dry dressing was applied, and the patient was taken back to the recovery room in satisfactory condition.  He received 30 mg of Toradol about 30 minutes before closure.  Estimated blood loss was 1300 cc, and no blood was given in return. He tolerated the procedure well and was taken back to the recovery room in satisfactory condition. DD:  05/10/00 TD:  05/11/00 Job: [id number]

## 2011-02-18 NOTE — H&P (Signed)
[place]. [facility]  Patient:    Arthur [name], Arthur [name]                       MRN: [id number] Adm. Date:  [id number] Attending:  Monica Becton                         History and Physical  HISTORY OF PRESENT ILLNESS:  This is a 70 year old man who presented to our office several weeks ago with an abnormal feeling prostate gland.  His PSA was 3.3 and he was basically asymptomatic.  When I examined him, I thought he had an asymmetrical firm, fixed right lobe which was suspicious for carcinoma.  At that point I recommended an ultrasound and prostate biopsy.  The biopsy was done in the office here on March 06, 2000, which showed two small focci of adenocarcinoma, Vanessa Ralphs grade 6 and also the same finding on the opposite lobe. So clinically, he was a stage PT2A.  PAST MEDICAL HISTORY:  The patient otherwise is basically in good health.  He has a history of cigarette smoking.  He quit about three months ago. Otherwise denies any serious medical problems, possibly some COPD symptoms. No wheezing and no history of pneumonia.  PAST SURGICAL HISTORY:  He has had a thyroidectomy, sinus surgery, and hernia repair in 1976.  ALLERGIES:  No known drug allergies.  MEDICATIONS:  He takes some inhalers for his COPD.  His urine was clear in our office.  FAMILY HISTORY:  Noncontributory.  SOCIAL HISTORY:  He is divorced.  A truck driver.  PHYSICAL EXAMINATION:  GENERAL:  A healthy man in no acute distress.  VITAL SIGNS:  Blood pressure 129/78, pulse 72, weight 221.  HEENT:  Unremarkable.  CHEST:  Clear to auscultation and percussion.  No rales or rhonchi.  HEART:  Regular sinus rhythm with no murmurs.  ABDOMEN:  Benign, nontender, and no masses, and no hernias.  GENITOURINARY:  Normal uncircumcised male.  Normal testicles bilaterally, nontender.  RECTAL:  +2 prostate, asymmetrical, firm, fixed right lobe.  No other anal or rectal lesions.  IMPRESSION:   Clinical stage T2A, Gleeson grade 6, adenocarcinoma of the prostate gland.  DISCUSSION:  The patient was in our office on two different occasions for consultation regarding treatment options for his prostate cancer.  He did see Wynn Banker, M.D. as well on April 17, 2000, for another opinion and he thought that surgery might be the best option for him longterm.  He was in our office again on May 08, 2000, with his ex-wife discussing options.  I gave him a choice of either radiation therapy or surgery, but I thought longterm he might do better with surgery considering how the prostate feels.  We discussed the nature of the radical prostatectomy, how we do it, and complications such as postoperative incontinence, impotence, postoperative wound infection.  We also discussed the fact that he would have to wear a catheter for possibly three weeks postsurgery.  He understands and agrees to the proposed surgery. He was given a bowel prep the day before consisting of clear liquid diet, 8 ounces of magnesium citrate, along with erythromycin, neomycin base, and flagyl. DD:  05/11/00 TD:  05/11/00 Job: [id number]

## 2011-02-18 NOTE — Procedures (Signed)
[facility]  Patient:    BRIGIDO, MERA                       MRN: [id number] Proc. Date: 12/12/00 Adm. Date:  [id number] Attending:  Sabino Gasser CC:         Jerl Mina, M.D.   Procedure Report  PROCEDURE PERFORMED:  Flexible sigmoidoscopy.  ENDOSCOPIST:  Sabino Gasser, M.D.  ANESTHESIA:  Demerol additional 60 mg and Versed 6.5 mg.  DESCRIPTION OF PROCEDURE:  With the patient mildly sedated in the left lateral decubitus position, the Olympus videoscope pediatric colonoscope was inserted into the rectum and passed under direct vision as far as we could, which was just distal to the ileocecal valve, which was photographed.  From this point, the colonoscope was slowly withdrawn taking circumferential views of the entire colonic mucosa and stopping only in the rectum which appeared normal on direct view and showed large internal hemorrhoids, which presumably is the cause of this patients bright red hematochezia.  The endoscope was then straightened and withdrawn through the anal canal, which other than hemorrhoids appeared normal.  The patients vital signs and pulse oximeter remained stable.  The patient tolerated the procedure well without apparent complications.  FINDINGS:  Enlarged hemorrhoids internally.  PLAN:  Will have the patient follow up with me and discuss advice concerning the surgery. DD:  12/12/00 TD:  12/13/00 Job: [id number]

## 2011-06-27 LAB — POCT I-STAT 3, ART BLOOD GAS (G3+)
O2 Saturation: 97
Operator id: 141321
pCO2 arterial: 40.9
pH, Arterial: 7.44
pO2, Arterial: 90

## 2011-06-27 LAB — POCT I-STAT 3, VENOUS BLOOD GAS (G3P V)
Bicarbonate: 27.5 — ABNORMAL HIGH
Operator id: 141321
pCO2, Ven: 46.1
pH, Ven: 7.383 — ABNORMAL HIGH

## 2011-11-03 DIAGNOSIS — M1710 Unilateral primary osteoarthritis, unspecified knee: Secondary | ICD-10-CM | POA: Diagnosis not present

## 2011-11-10 DIAGNOSIS — M1710 Unilateral primary osteoarthritis, unspecified knee: Secondary | ICD-10-CM | POA: Diagnosis not present

## 2011-11-16 ENCOUNTER — Other Ambulatory Visit: Payer: Self-pay | Admitting: Physician Assistant

## 2011-11-16 ENCOUNTER — Other Ambulatory Visit: Payer: Self-pay | Admitting: Internal Medicine

## 2011-11-17 DIAGNOSIS — M1710 Unilateral primary osteoarthritis, unspecified knee: Secondary | ICD-10-CM | POA: Diagnosis not present

## 2011-11-21 ENCOUNTER — Telehealth: Payer: Self-pay

## 2011-11-21 DIAGNOSIS — N4340 Spermatocele of epididymis, unspecified: Secondary | ICD-10-CM | POA: Diagnosis not present

## 2011-11-21 DIAGNOSIS — N393 Stress incontinence (female) (male): Secondary | ICD-10-CM | POA: Diagnosis not present

## 2011-11-21 DIAGNOSIS — Z8546 Personal history of malignant neoplasm of prostate: Secondary | ICD-10-CM | POA: Diagnosis not present

## 2011-11-21 DIAGNOSIS — N529 Male erectile dysfunction, unspecified: Secondary | ICD-10-CM | POA: Diagnosis not present

## 2011-11-21 NOTE — Telephone Encounter (Signed)
.  [facility]   [facility] REQUESTING LAST PSA BE FAXED TO THEM ASAP(PT IN [facility])    BEST FAX # [phone]

## 2011-11-21 NOTE — Telephone Encounter (Signed)
Last PSA faxed to [facility] with confirmation.

## 2011-11-24 DIAGNOSIS — M1710 Unilateral primary osteoarthritis, unspecified knee: Secondary | ICD-10-CM | POA: Diagnosis not present

## 2011-11-25 ENCOUNTER — Encounter: Payer: Self-pay | Admitting: *Deleted

## 2011-11-25 DIAGNOSIS — C801 Malignant (primary) neoplasm, unspecified: Secondary | ICD-10-CM | POA: Insufficient documentation

## 2011-11-25 DIAGNOSIS — J449 Chronic obstructive pulmonary disease, unspecified: Secondary | ICD-10-CM

## 2011-11-25 DIAGNOSIS — K219 Gastro-esophageal reflux disease without esophagitis: Secondary | ICD-10-CM

## 2011-11-25 DIAGNOSIS — N529 Male erectile dysfunction, unspecified: Secondary | ICD-10-CM | POA: Insufficient documentation

## 2011-11-25 DIAGNOSIS — I447 Left bundle-branch block, unspecified: Secondary | ICD-10-CM

## 2011-11-25 DIAGNOSIS — G43909 Migraine, unspecified, not intractable, without status migrainosus: Secondary | ICD-10-CM

## 2011-11-25 DIAGNOSIS — I10 Essential (primary) hypertension: Secondary | ICD-10-CM

## 2011-11-28 ENCOUNTER — Encounter: Payer: Self-pay | Admitting: Internal Medicine

## 2011-11-28 ENCOUNTER — Other Ambulatory Visit: Payer: Self-pay | Admitting: Internal Medicine

## 2011-11-28 ENCOUNTER — Ambulatory Visit (INDEPENDENT_AMBULATORY_CARE_PROVIDER_SITE_OTHER): Payer: Medicare Other | Admitting: Internal Medicine

## 2011-11-28 DIAGNOSIS — N390 Urinary tract infection, site not specified: Secondary | ICD-10-CM

## 2011-11-28 DIAGNOSIS — R1110 Vomiting, unspecified: Secondary | ICD-10-CM | POA: Diagnosis not present

## 2011-11-28 DIAGNOSIS — C61 Malignant neoplasm of prostate: Secondary | ICD-10-CM | POA: Diagnosis not present

## 2011-11-28 DIAGNOSIS — G43909 Migraine, unspecified, not intractable, without status migrainosus: Secondary | ICD-10-CM | POA: Diagnosis not present

## 2011-11-28 DIAGNOSIS — K219 Gastro-esophageal reflux disease without esophagitis: Secondary | ICD-10-CM

## 2011-11-28 DIAGNOSIS — J449 Chronic obstructive pulmonary disease, unspecified: Secondary | ICD-10-CM

## 2011-11-28 DIAGNOSIS — R51 Headache: Secondary | ICD-10-CM | POA: Diagnosis not present

## 2011-11-28 DIAGNOSIS — Z7189 Other specified counseling: Secondary | ICD-10-CM

## 2011-11-28 DIAGNOSIS — E039 Hypothyroidism, unspecified: Secondary | ICD-10-CM

## 2011-11-28 DIAGNOSIS — Z125 Encounter for screening for malignant neoplasm of prostate: Secondary | ICD-10-CM | POA: Diagnosis not present

## 2011-11-28 LAB — POCT CBC
HCT, POC: 45 % (ref 43.5–53.7)
Hemoglobin: 14.4 g/dL (ref 14.1–18.1)
Lymph, poc: 2.5 (ref 0.6–3.4)
MCHC: 32 g/dL (ref 31.8–35.4)
MCV: 90.2 fL (ref 80–97)
POC Granulocyte: 7.5 — AB (ref 2–6.9)
POC LYMPH PERCENT: 23.3 %L (ref 10–50)
WBC: 10.7 10*3/uL — AB (ref 4.6–10.2)

## 2011-11-28 LAB — POCT URINALYSIS DIPSTICK
Bilirubin, UA: NEGATIVE
Ketones, UA: NEGATIVE
Leukocytes, UA: NEGATIVE
Spec Grav, UA: 1.025
pH, UA: 7

## 2011-11-28 LAB — POCT UA - MICROSCOPIC ONLY
Bacteria, U Microscopic: NEGATIVE
Casts, Ur, LPF, POC: NEGATIVE
Mucus, UA: NEGATIVE
WBC, Ur, HPF, POC: NEGATIVE

## 2011-11-28 MED ORDER — ONDANSETRON 4 MG PO TBDP
8.0000 mg | ORAL_TABLET | Freq: Once | ORAL | Status: AC
  Administered 2011-11-28: 8 mg via ORAL

## 2011-11-28 MED ORDER — RANITIDINE HCL 150 MG PO TABS: 150.0000 mg | ORAL_TABLET | Freq: Two times a day (BID) | ORAL | Status: DC

## 2011-11-28 MED ORDER — TRAMADOL HCL 50 MG PO TABS: 50.0000 mg | ORAL_TABLET | Freq: Four times a day (QID) | ORAL | Status: DC | PRN

## 2011-11-28 NOTE — Progress Notes (Signed)
  Subjective:    Patient ID: Arthur [name], male    DOB: 11/03/1940, 71 y.o.   MRN: [id number]  HPI C/o vomiting, heartburn, chest pain. Also c/o freq, dysuria. Ran out of tramadol and ranitidine needs refills   Review of Systems  Constitutional: Positive for activity change and fatigue.  Respiratory: Positive for shortness of breath.   Cardiovascular: Positive for chest pain.  Gastrointestinal: Positive for vomiting.  Genitourinary: Positive for dysuria, frequency and difficulty urinating.  Neurological: Positive for headaches.       Objective:   Physical Exam  Constitutional: He is oriented to person, place, and time. He appears distressed.  Neck: Neck supple.  Cardiovascular: Normal rate and regular rhythm.   Pulmonary/Chest: Effort normal.  Abdominal: He exhibits no mass. There is tenderness. There is no rebound and no guarding.  Neurological: He is alert and oriented to person, place, and time.  Skin: Skin is warm and dry.          Assessment & Plan:   EKG CBC, CCUA  PSA Dr. Aaron Edelman request  Vomiting, Fatigue, GERD will tx sx and f/up closely  Zofan 8mg  prn Hydrate Omeprazole 40mg  for a few days

## 2011-11-29 ENCOUNTER — Encounter: Payer: Self-pay | Admitting: Family Medicine

## 2011-12-01 DIAGNOSIS — M1710 Unilateral primary osteoarthritis, unspecified knee: Secondary | ICD-10-CM | POA: Diagnosis not present

## 2011-12-06 ENCOUNTER — Encounter: Payer: Self-pay | Admitting: *Deleted

## 2011-12-19 ENCOUNTER — Other Ambulatory Visit: Payer: Self-pay | Admitting: Internal Medicine

## 2011-12-28 ENCOUNTER — Ambulatory Visit (INDEPENDENT_AMBULATORY_CARE_PROVIDER_SITE_OTHER): Payer: Medicare Other | Admitting: Emergency Medicine

## 2011-12-28 DIAGNOSIS — R109 Unspecified abdominal pain: Secondary | ICD-10-CM

## 2011-12-28 LAB — COMPREHENSIVE METABOLIC PANEL
Albumin: 4.5 g/dL (ref 3.5–5.2)
BUN: 16 mg/dL (ref 6–23)
CO2: 27 mEq/L (ref 19–32)
Glucose, Bld: 81 mg/dL (ref 70–99)
Potassium: 4.3 mEq/L (ref 3.5–5.3)
Sodium: 140 mEq/L (ref 135–145)
Total Protein: 7.1 g/dL (ref 6.0–8.3)

## 2011-12-28 LAB — AMYLASE: Amylase: 58 U/L (ref 0–105)

## 2011-12-28 MED ORDER — SUCRALFATE 1 GM/10ML PO SUSP: ORAL | Status: DC

## 2011-12-28 MED ORDER — OMEPRAZOLE 20 MG PO CPDR: 20.0000 mg | DELAYED_RELEASE_CAPSULE | Freq: Every day | ORAL | Status: DC

## 2011-12-28 NOTE — Patient Instructions (Signed)

## 2011-12-28 NOTE — Progress Notes (Signed)
  Subjective:    Patient ID: Arthur [name], male    DOB: 1941-10-03, 71 y.o.   MRN: [id number]  HPI patient in with persistent heartburn sensation. He feels something sticking in his throat. From the note reviewed in 2002 there is an issue about possible Barrett's esophagus. He was started on Zantac but continues to have significant symptoms. Denies any chest pain. In 2009 stress test and cardiac catheterization and by report from him these were within normal limits. He does have a baseline left bundle branch block by EKG.    Review of Systems he denies any recent weight loss. Arthur [name] been normal for him. He denies any substernal chest pain.    Objective:   Physical Exam  Constitutional: He appears well-developed and well-nourished.  HENT:  Right Ear: External ear normal.  Left Ear: External ear normal.  Neck: No JVD present. No tracheal deviation present. No thyromegaly present.  Cardiovascular: Normal rate and regular rhythm.   Pulmonary/Chest: Breath sounds normal. No respiratory distress. He has no wheezes.  Abdominal:       The abdomen is flat. There is tenderness to palpation in the midepigastrium which extends slightly to the left towards the spleen. I do not feel mass on exam.  Lymphadenopathy:    He has no cervical adenopathy.          Assessment & Plan:  Patient has had a severe episode of esophagitis. He has a history of Barrett's esophagus in 2002 with no followup. Patient referred to GI for repeat endoscopy.

## 2011-12-29 ENCOUNTER — Encounter: Payer: Self-pay | Admitting: Internal Medicine

## 2011-12-29 ENCOUNTER — Encounter: Payer: Self-pay | Admitting: Gastroenterology

## 2012-01-06 ENCOUNTER — Ambulatory Visit: Payer: Self-pay | Admitting: Gastroenterology

## 2012-01-16 ENCOUNTER — Telehealth: Payer: Self-pay | Admitting: Physician Assistant

## 2012-01-16 MED ORDER — LEVOTHYROXINE SODIUM 200 MCG PO TABS: 200.0000 ug | ORAL_TABLET | Freq: Every day | ORAL | Status: DC

## 2012-01-16 NOTE — Telephone Encounter (Signed)
Fax from pharmacy that Levoxyl 200 mcg is on voluntary recall by the manufacturer, without release date.  They request a different brand or authorization to dispense the generic.  Authorized.

## 2012-01-25 ENCOUNTER — Ambulatory Visit: Payer: Self-pay | Admitting: Gastroenterology

## 2012-01-26 ENCOUNTER — Ambulatory Visit (INDEPENDENT_AMBULATORY_CARE_PROVIDER_SITE_OTHER): Payer: Medicare Other | Admitting: Internal Medicine

## 2012-01-26 ENCOUNTER — Encounter: Payer: Self-pay | Admitting: Internal Medicine

## 2012-01-26 VITALS — BP 120/74 | HR 74 | Ht 71.5 in | Wt 203.6 lb

## 2012-01-26 DIAGNOSIS — K219 Gastro-esophageal reflux disease without esophagitis: Secondary | ICD-10-CM

## 2012-01-26 DIAGNOSIS — R1314 Dysphagia, pharyngoesophageal phase: Secondary | ICD-10-CM

## 2012-01-26 DIAGNOSIS — Z860100 Personal history of colon polyps, unspecified: Secondary | ICD-10-CM

## 2012-01-26 DIAGNOSIS — R1310 Dysphagia, unspecified: Secondary | ICD-10-CM

## 2012-01-26 NOTE — Patient Instructions (Addendum)
You have been given a separate informational sheet regarding your tobacco use, the importance of quitting and local resources to help you quit.  You have been scheduled for an endoscopy. Please follow written instructions given to you at your visit today.  

## 2012-01-26 NOTE — Progress Notes (Signed)
Subjective:    Patient ID: Arthur [name], male    DOB: [birth year], 71 y.o.   MRN: [id number]  Referring M.D.: Lesle Chris, MD  HPI Patient is referred for evaluation of dysphagia. He reports a several month history of intermittent suprasternal intact dysphagia with a more constant sensation of tightness in that area. He had increasing heartburn and regurgitation symptoms. He was started on omeprazole 20 mg daily in March and has noted improvement in the heartburn but still has intermittent dysphagia. He think is probably gained a little bit of weight, there's been no bleeding. There is some epigastric pain off and on as well. He does not recall having problems like this before though he did have an endoscopy about 12 or 13 years ago by another gastroenterologist. He has an associated sore throat. He has some hoarseness which has been chronic since having a thyroidectomy but thinks that his voice does get a bit more raspy lately. The remainder of his GI review of systems is negative.  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 He had quit smoking but returned to it, he says he is trying to quit. 2 caffeinated beverages daily. He has previously been given GERD diet instructions at his primary care visit. Medications, allergies, past medical history, past surgical history, family history and social history are reviewed and updated in the EMR.  Review of Systems Positive for those things mentioned in the history of present illness. He also has intermittent headaches and arthritis pain.    Objective:   Physical Exam General:  Well-developed, well-nourished and in no acute distress Eyes:  anicteric. ENT:   Mouth and posterior pharynx free of lesions. Upper and lower dentures are present. Neck:   supple w/o thyromegaly or mass. Some patchy erythema in the skin of the neck Lungs: Clear to auscultation bilaterally with somewhat decreased  breath sounds and equal inspiratory and expiratory phase Heart:  S1S2, no rubs, murmurs, gallops. Sounds are very distant Abdomen:  soft, non-tender, no hepatosplenomegaly, hernia, or mass and BS+.  Lymph:  no cervical or supraclavicular adenopathy. Extremities:   no edema Skin   neck erythema, some patchy acne-like pustules in the neck and suprasternal area. Neuro:  A&O x 3.  Psych:  appropriate mood and affect.   Data Reviewed: Lab Results  Component Value Date   WBC 10.7* 11/28/2011   HGB 14.4 11/28/2011   HCT 45.0 11/28/2011   MCV 90.2 11/28/2011   PLT 183 01/08/2010    Previous primary care office notes. They indicate apparent history of Barrett's esophagus in 2002. Presumably that's her endoscopy reports. Pathology report review from 2001 2002 does not show any intestinal metaplasia of the esophagus and he may really not have Barrett's esophagus.      Assessment & Plan:   1. Esophageal dysphagia      2. GERD (gastroesophageal reflux disease)    Constellation of heartburn and symptoms compatible cancer such reflux disease, neck tightness with sore throat and globus sensation as well as solid food dysphagia. Sounds like a peptic stricture in the setting of GERD most likely. Esophageal cancer is possible. He is improving on PPI therapy so we'll continue that. He needs an upper endoscopy with probable esophageal dilation.The risks and benefits as well as alternatives of endoscopic procedure(s) have been discussed and reviewed. All questions answered. The patient agrees to proceed. He is trying to quit smoking.  I appreciate the opportunity  to care for this patient.  CC: Lesle Chris, MD

## 2012-02-02 ENCOUNTER — Ambulatory Visit (AMBULATORY_SURGERY_CENTER): Payer: Medicare Other | Admitting: Internal Medicine

## 2012-02-02 ENCOUNTER — Encounter: Payer: Self-pay | Admitting: Internal Medicine

## 2012-02-02 VITALS — BP 135/81 | HR 57 | Temp 97.8°F | Resp 17 | Ht 72.0 in | Wt 203.0 lb

## 2012-02-02 DIAGNOSIS — R1310 Dysphagia, unspecified: Secondary | ICD-10-CM

## 2012-02-02 DIAGNOSIS — K2100 Gastro-esophageal reflux disease with esophagitis, without bleeding: Secondary | ICD-10-CM

## 2012-02-02 DIAGNOSIS — R1319 Other dysphagia: Secondary | ICD-10-CM

## 2012-02-02 DIAGNOSIS — R1314 Dysphagia, pharyngoesophageal phase: Secondary | ICD-10-CM

## 2012-02-02 MED ORDER — SODIUM CHLORIDE 0.9 % IV SOLN: 500.0000 mL | INTRAVENOUS | Status: DC

## 2012-02-02 MED ORDER — PANTOPRAZOLE SODIUM 40 MG PO TBEC: 40.0000 mg | DELAYED_RELEASE_TABLET | Freq: Every day | ORAL | Status: DC

## 2012-02-02 NOTE — Op Note (Addendum)
[facility] [address] [place], [place]  [address]  ENDOSCOPY PROCEDURE REPORT  PATIENT:  Arthur, [name]  MR#:  [id number] BIRTHDATE:  06/28/1941, 70 yrs. old  GENDER:  male  ENDOSCOPIST:  Iva Boop, MD, [facility]  PROCEDURE DATE:  02/02/2012 PROCEDURE:  EGD, diagnostic, Elease Hashimoto Dilation of the Esophagus ASA CLASS:  Class II INDICATIONS:  1) dysphagia  2) heartburn  MEDICATIONS:   These medications were titrated to patient response per physician's verbal order, Fentanyl 50 mcg, Versed 5 mg IV TOPICAL ANESTHETIC:  Cetacaine Spray  DESCRIPTION OF PROCEDURE:   After the risks benefits and alternatives of the procedure were thoroughly explained, informed consent was obtained.  The [id number]-H180 [id number] endoscope was introduced through the mouth and advanced to the second portion of the duodenum, without limitations.  The instrument was slowly withdrawn as the mucosa was carefully examined. <<PROCEDUREIMAGES>>  Multiple erosions were found in the distal esophagus. 40-42 cm (z-line at 42). Several linear erosions up to 2 cm long. LA Grade C.  The examination was otherwise normal.    Dilation was then performed at the total esophagus  1) Dilator:  Elease Hashimoto  Size(s):  54 french Resistance:  minimal  Heme:  none  COMPLICATIONS:  None  ENDOSCOPIC IMPRESSION: 1) Erosions, multiple in the distal esophagus - consistent with reflux esophagitis 2) Otherwise normal examination. RECOMMENDATIONS: 1) post-dilation diet today 2) needs increase in PPI strength - take two 20 mg omeprazole every AM to make 40 mg total dose and I have also sent new prescription for 40 mg daily ADDENDUM: prescription is for pantoprazole 40 mg 3) Maximize lifestyle changes for GERD - see handout provided today 4) return to Dr. Leone Payor in 2 months (early to mid-July to reassess - will need repeat EGD to ensure healing later in 2013  REPEAT EXAM:  later in 2013 once symptoms controlled on  PPI  Iva Boop, MD, [facility]  CC:  Lesle Chris, MD and The Patient  n. REVISED:  02/02/2012 02:13 PM eSIGNED:   Iva Boop at 02/02/2012 02:13 PM  Storm Frisk, [phone]

## 2012-02-02 NOTE — Patient Instructions (Signed)
YOU HAD AN ENDOSCOPIC PROCEDURE TODAY AT THE [facility]: Refer to the procedure report that was given to you for any specific questions about what was found during the examination.  If the procedure report does not answer your questions, please call your gastroenterologist to clarify.  If you requested that your care partner not be given the details of your procedure findings, then the procedure report has been included in a sealed envelope for you to review at your convenience later.  YOU SHOULD EXPECT: Some feelings of bloating in the abdomen. Passage of more gas than usual.  Walking can help get rid of the air that was put into your GI tract during the procedure and reduce the bloating. If you had a lower endoscopy (such as a colonoscopy or flexible sigmoidoscopy) you may notice spotting of blood in your stool or on the toilet paper. If you underwent a bowel prep for your procedure, then you may not have a normal bowel movement for a few days.  DIET: Your first meal following the procedure should be a light meal and then it is ok to progress to your normal diet.  A half-sandwich or bowl of soup is an example of a good first meal.  Heavy or fried foods are harder to digest and may make you feel nauseous or bloated.  Likewise meals heavy in dairy and vegetables can cause extra gas to form and this can also increase the bloating.  Drink plenty of fluids but you should avoid alcoholic beverages for 24 hours.  ACTIVITY: Your care partner should take you home directly after the procedure.  You should plan to take it easy, moving slowly for the rest of the day.  You can resume normal activity the day after the procedure however you should NOT DRIVE or use heavy machinery for 24 hours (because of the sedation medicines used during the test).    SYMPTOMS TO REPORT IMMEDIATELY: A gastroenterologist can be reached at any hour.  During normal business hours, 8:30 AM to 5:00 PM Monday through Friday,  call [phone].  After hours and on weekends, please call the GI answering service at [phone] who will take a message and have the physician on call contact you.   Following upper endoscopy (EGD)  Vomiting of blood or coffee ground material  New chest pain or pain under the shoulder blades  Painful or persistently difficult swallowing  New shortness of breath  Fever of 100F or higher  Black, tarry-looking stools  FOLLOW UP: If any biopsies were taken you will be contacted by phone or by letter within the next 1-3 weeks.  Call your gastroenterologist if you have not heard about the biopsies in 3 weeks.  Our staff will call the home number listed on your records the next business day following your procedure to check on you and address any questions or concerns that you may have at that time regarding the information given to you following your procedure. This is a courtesy call and so if there is no answer at the home number and we have not heard from you through the emergency physician on call, we will assume that you have returned to your regular daily activities without incident.  SIGNATURES/CONFIDENTIALITY: You and/or your care partner have signed paperwork which will be entered into your electronic medical record.  These signatures attest to the fact that that the information above on your After Visit Summary has been reviewed and is understood.  Full responsibility   of the confidentiality of this discharge information lies with you and/or your care-partner.    DILATION DIET TODAY AS DIRECTED. RESUME YOUR REGULAR DIET TOMORROW PLEASE  DR Leone Payor INCREASED YOUR PPI MEDICINE STRENGTH. TAKE 2 20 MG OMEPRAZOLE EVERY MORNING TO MAKE 40 MG TOTAL DOSE. DR Leone Payor HAS SENT A NEW PRESCRIPTION FOR 40 MG OMEPRAZOLE DAILY. RETURN TO DR Leone Payor IN 2 MONTHS. PLEASE CALL [phone] TO SCHEDULE THIS APPOINTMENT IN THE NEXT 2-3 DAYS. YOU WILL NEED A REPEAT ENDOSCOPY LATER IN 2013 TO ENSURE HEALING  ONCE SYMPTOMS CONTROLLED ON PPI.

## 2012-02-02 NOTE — Progress Notes (Signed)
The pt tolerated the egd with dilatation very well. Maw   

## 2012-02-02 NOTE — Progress Notes (Signed)
Patient did not experience any of the following events: a burn prior to discharge; a fall within the facility; wrong site/side/patient/procedure/implant event; or a hospital transfer or hospital admission upon discharge from the facility. (G8907) Patient did not have preoperative order for IV antibiotic SSI prophylaxis. (G8918)  

## 2012-02-03 ENCOUNTER — Telehealth: Payer: Self-pay

## 2012-02-03 NOTE — Telephone Encounter (Signed)
No answer.  Did not give permission to leave message.

## 2012-02-20 ENCOUNTER — Ambulatory Visit (INDEPENDENT_AMBULATORY_CARE_PROVIDER_SITE_OTHER): Payer: PRIVATE HEALTH INSURANCE | Admitting: Internal Medicine

## 2012-02-20 ENCOUNTER — Encounter: Payer: Self-pay | Admitting: Internal Medicine

## 2012-02-20 VITALS — BP 123/71 | HR 69 | Temp 97.3°F | Resp 16 | Ht 71.0 in | Wt 203.0 lb

## 2012-02-20 DIAGNOSIS — C73 Malignant neoplasm of thyroid gland: Secondary | ICD-10-CM

## 2012-02-20 DIAGNOSIS — J029 Acute pharyngitis, unspecified: Secondary | ICD-10-CM | POA: Diagnosis not present

## 2012-02-20 DIAGNOSIS — Z7189 Other specified counseling: Secondary | ICD-10-CM | POA: Diagnosis not present

## 2012-02-20 DIAGNOSIS — G43709 Chronic migraine without aura, not intractable, without status migrainosus: Secondary | ICD-10-CM

## 2012-02-20 DIAGNOSIS — K219 Gastro-esophageal reflux disease without esophagitis: Secondary | ICD-10-CM

## 2012-02-20 DIAGNOSIS — E039 Hypothyroidism, unspecified: Secondary | ICD-10-CM | POA: Diagnosis not present

## 2012-02-20 DIAGNOSIS — C61 Malignant neoplasm of prostate: Secondary | ICD-10-CM

## 2012-02-20 MED ORDER — AMOXICILLIN 500 MG PO CAPS: 1000.0000 mg | ORAL_CAPSULE | Freq: Two times a day (BID) | ORAL | Status: AC

## 2012-02-20 NOTE — Progress Notes (Signed)
  Subjective:    Patient ID: Arthur [name], male    DOB: Dec 22, 1940, 71 y.o.   MRN: [id number]  HPI Has low thyroid, needs adjustment meds, TSH. Genella Rife is worse see Dr. Leone Payor workup. Chronic pain is better control now. Migrains about 1-2 month, no prevention med Has acute st and head cold, green disch.  Is not smoking!  Review of Systems    complex Objective:   Physical Exam Throat  Red, tender Lungs clear Heart nl Thyroid nl       Assessment & Plan:  Amoxil 1g RTC 6 mos DOT completed

## 2012-02-20 NOTE — Patient Instructions (Signed)

## 2012-03-13 DIAGNOSIS — M1710 Unilateral primary osteoarthritis, unspecified knee: Secondary | ICD-10-CM | POA: Diagnosis not present

## 2012-04-09 ENCOUNTER — Ambulatory Visit (INDEPENDENT_AMBULATORY_CARE_PROVIDER_SITE_OTHER): Payer: Medicare Other | Admitting: Internal Medicine

## 2012-04-09 ENCOUNTER — Encounter: Payer: Self-pay | Admitting: Internal Medicine

## 2012-04-09 VITALS — BP 140/68 | HR 68 | Ht 71.0 in | Wt 201.1 lb

## 2012-04-09 DIAGNOSIS — R1314 Dysphagia, pharyngoesophageal phase: Secondary | ICD-10-CM

## 2012-04-09 DIAGNOSIS — R1310 Dysphagia, unspecified: Secondary | ICD-10-CM

## 2012-04-09 NOTE — Patient Instructions (Addendum)
You have been scheduled for an endoscopy with propofol. Please follow written instructions given to you at your visit today.  

## 2012-04-09 NOTE — Progress Notes (Signed)
Patient ID: Arthur [name], male   DOB: 1941/08/30, 71 y.o.   MRN: [id number]  Chief complaint:  The patient returns for followup after he was seen for dysphagia. He had erosive esophagitis and had dilation of the esophagus performed. Omeprazole 20 mg daily was changed to pantoprazole 40 mg daily. Since that time he has fleeting moments of dysphagia the last for a second or 2 on an intermittent basis but overall does not have any significant impact dysphagia and feels much better. He does not have heartburn or chest pain complaints.  Medications, allergies, past medical history, past surgical history, family history and social history are reviewed and updated in the EMR.   Physical exam:   Vital signs are flowed, general appearance is that of a normal elderly white man in no acute distress.   1. Reflux esophagitis   2. Esophageal dysphagia    He is significantly improved but his dysphagia is not resolved. I think it is prudent to document healing of the esophagitis. This will allow me to inspect for potential precancerous lesion such as Barrett's esophagus, and also knows the current dose of medication is effective enough to promote complete healing. I've explained the rationale, the risks benefits and indications and he understands and agrees to proceed. He could possibly need a repeat dilation, we'll talk to him when he returns and decided that at the time of endoscopy. Note, he could have some residual motility disturbance based upon chronic reflux that would probably not respond to dilation. Overall he feels like he is doing well without significant impact dysphagia as before.  CC: GUEST, Loretha Stapler, MD

## 2012-04-13 ENCOUNTER — Encounter: Payer: Self-pay | Admitting: Internal Medicine

## 2012-04-13 ENCOUNTER — Ambulatory Visit (AMBULATORY_SURGERY_CENTER): Payer: Medicare Other | Admitting: Internal Medicine

## 2012-04-13 VITALS — BP 129/60 | HR 52 | Temp 99.2°F | Resp 16 | Ht 71.0 in | Wt 201.0 lb

## 2012-04-13 DIAGNOSIS — R1310 Dysphagia, unspecified: Secondary | ICD-10-CM | POA: Diagnosis not present

## 2012-04-13 DIAGNOSIS — R1314 Dysphagia, pharyngoesophageal phase: Secondary | ICD-10-CM | POA: Diagnosis not present

## 2012-04-13 DIAGNOSIS — K219 Gastro-esophageal reflux disease without esophagitis: Secondary | ICD-10-CM | POA: Diagnosis not present

## 2012-04-13 MED ORDER — SODIUM CHLORIDE 0.9 % IV SOLN: 500.0000 mL | INTRAVENOUS | Status: DC

## 2012-04-13 NOTE — Patient Instructions (Addendum)
The esophagus is healed. No strictures (narrow areas) seen.  Please stay on the pantoprazole (Protonix) to prevent further damage to the esophagus. Your primary care provider should be able to prescribe this in the future.  Chew food slowly and well, drink liquids after eating.  If you have continued and bothersome swallowing problems coe back to see me.\  Thank you for choosing me and [facility].  Iva Boop, MD, FACG YOU HAD AN ENDOSCOPIC PROCEDURE TODAY AT THE [facility]: Refer to the procedure report that was given to you for any specific questions about what was found during the examination.  If the procedure report does not answer your questions, please call your gastroenterologist to clarify.  If you requested that your care partner not be given the details of your procedure findings, then the procedure report has been included in a sealed envelope for you to review at your convenience later.  YOU SHOULD EXPECT: Some feelings of bloating in the abdomen. Passage of more gas than usual.  Walking can help get rid of the air that was put into your GI tract during the procedure and reduce the bloating. If you had a lower endoscopy (such as a colonoscopy or flexible sigmoidoscopy) you may notice spotting of blood in your stool or on the toilet paper. If you underwent a bowel prep for your procedure, then you may not have a normal bowel movement for a few days.  DIET: Your first meal following the procedure should be a light meal and then it is ok to progress to your normal diet.  A half-sandwich or bowl of soup is an example of a good first meal.  Heavy or fried foods are harder to digest and may make you feel nauseous or bloated.  Likewise meals heavy in dairy and vegetables can cause extra gas to form and this can also increase the bloating.  Drink plenty of fluids but you should avoid alcoholic beverages for 24 hours.  ACTIVITY: Your care partner should take you  home directly after the procedure.  You should plan to take it easy, moving slowly for the rest of the day.  You can resume normal activity the day after the procedure however you should NOT DRIVE or use heavy machinery for 24 hours (because of the sedation medicines used during the test).    SYMPTOMS TO REPORT IMMEDIATELY: A gastroenterologist can be reached at any hour.  During normal business hours, 8:30 AM to 5:00 PM Monday through Friday, call [phone].  After hours and on weekends, please call the GI answering service at [phone] who will take a message and have the physician on call contact you.   Following upper endoscopy (EGD)  Vomiting of blood or coffee ground material  New chest pain or pain under the shoulder blades  Painful or persistently difficult swallowing  New shortness of breath  Fever of 100F or higher  Black, tarry-looking stools  FOLLOW UP: If any biopsies were taken you will be contacted by phone or by letter within the next 1-3 weeks.  Call your gastroenterologist if you have not heard about the biopsies in 3 weeks.  Our staff will call the home number listed on your records the next business day following your procedure to check on you and address any questions or concerns that you may have at that time regarding the information given to you following your procedure. This is a courtesy call and so if there is no answer at  the home number and we have not heard from you through the emergency physician on call, we will assume that you have returned to your regular daily activities without incident.  SIGNATURES/CONFIDENTIALITY: You and/or your care partner have signed paperwork which will be entered into your electronic medical record.  These signatures attest to the fact that that the information above on your After Visit Summary has been reviewed and is understood.  Full responsibility of the confidentiality of this discharge information lies with you and/or  your care-partner.

## 2012-04-13 NOTE — Op Note (Signed)
[facility] [address] [place], [place]  [address]  ENDOSCOPY PROCEDURE REPORT  PATIENT:  Arthur, [name]  MR#:  [id number] BIRTHDATE:  1941-02-26, 70 yrs. old  GENDER:  male  ENDOSCOPIST:  Iva Boop, MD, [place] [facility]  PROCEDURE DATE:  04/13/2012 PROCEDURE:  Esophagoscopy ASA CLASS:  Class III INDICATIONS:  document healing of erosive esophagitis and exclude Barrett's  MEDICATIONS:   These medications were titrated to patient response per physician's verbal order, MAC sedation, administered by CRNA, propofol (Diprivan) 150 mg IV TOPICAL ANESTHETIC:  none  DESCRIPTION OF PROCEDURE:   After the risks benefits and alternatives of the procedure were thoroughly explained, informed consent was obtained.  The LB GIF-H180 [id number] endoscope was introduced through the mouth and advanced to the stomach body, without limitations.  The instrument was slowly withdrawn as the mucosa was fully examined. <<PROCEDUREIMAGES>>  The esophagus ad proximal stomach o body) were normal. Retroflexed views revealed no abnormalities.    The scope was then withdrawn from the patient and the procedure completed.  COMPLICATIONS:  None  ENDOSCOPIC IMPRESSION: 1) Normal exam of esophagus and proximal stomach- esophagitis is healed and no stricture RECOMMENDATIONS: 1) Stay on PPI chronically 2) Chew food well and carefully 3) Follow-up GI if worsening dysphagia  Iva Boop, MD, Clementeen Graham  CC:  The Patient  n. eSIGNED:   Iva Boop at 04/13/2012 03:35 PM  Storm Frisk, [id number]

## 2012-04-13 NOTE — Progress Notes (Signed)
Patient did not have preoperative order for IV antibiotic SSI prophylaxis. (G8918)  Patient did not experience any of the following events: a burn prior to discharge; a fall within the facility; wrong site/side/patient/procedure/implant event; or a hospital transfer or hospital admission upon discharge from the facility. (G8907)  

## 2012-04-16 ENCOUNTER — Telehealth: Payer: Self-pay | Admitting: *Deleted

## 2012-04-16 NOTE — Telephone Encounter (Signed)
  Follow up Call-  Call back number 04/13/2012 02/02/2012  Post procedure Call Back phone  # [phone]  [phone], cell number  Permission to leave phone message Yes No     Patient questions:  Do you have a fever, pain , or abdominal swelling? no Pain Score  0 *  Have you tolerated food without any problems? yes  Have you been able to return to your normal activities? yes  Do you have any questions about your discharge instructions: Diet   no Medications  no Follow up visit  no  Do you have questions or concerns about your Care? no  Actions: * If pain score is 4 or above: No action needed, pain <4.

## 2012-05-02 ENCOUNTER — Other Ambulatory Visit: Payer: Self-pay | Admitting: Internal Medicine

## 2012-05-02 ENCOUNTER — Other Ambulatory Visit: Payer: Self-pay | Admitting: Physician Assistant

## 2012-05-03 ENCOUNTER — Other Ambulatory Visit: Payer: Self-pay | Admitting: Physician Assistant

## 2012-05-03 MED ORDER — RIZATRIPTAN BENZOATE 10 MG PO TABS: 10.0000 mg | ORAL_TABLET | ORAL | Status: DC | PRN

## 2012-05-03 MED ORDER — LEVOTHYROXINE SODIUM 200 MCG PO TABS: 200.0000 ug | ORAL_TABLET | Freq: Every day | ORAL | Status: DC

## 2012-05-10 ENCOUNTER — Other Ambulatory Visit: Payer: Self-pay

## 2012-05-10 ENCOUNTER — Ambulatory Visit (INDEPENDENT_AMBULATORY_CARE_PROVIDER_SITE_OTHER): Payer: Medicare Other | Admitting: Internal Medicine

## 2012-05-10 ENCOUNTER — Ambulatory Visit: Payer: Medicare Other

## 2012-05-10 VITALS — BP 130/80 | HR 64 | Temp 98.4°F | Resp 17 | Ht 71.5 in | Wt 199.0 lb

## 2012-05-10 DIAGNOSIS — R079 Chest pain, unspecified: Secondary | ICD-10-CM

## 2012-05-10 DIAGNOSIS — R509 Fever, unspecified: Secondary | ICD-10-CM | POA: Diagnosis not present

## 2012-05-10 DIAGNOSIS — J029 Acute pharyngitis, unspecified: Secondary | ICD-10-CM | POA: Diagnosis not present

## 2012-05-10 DIAGNOSIS — K219 Gastro-esophageal reflux disease without esophagitis: Secondary | ICD-10-CM

## 2012-05-10 DIAGNOSIS — J40 Bronchitis, not specified as acute or chronic: Secondary | ICD-10-CM

## 2012-05-10 DIAGNOSIS — F17200 Nicotine dependence, unspecified, uncomplicated: Secondary | ICD-10-CM | POA: Insufficient documentation

## 2012-05-10 LAB — POCT CBC
Hemoglobin: 14.1 g/dL (ref 14.1–18.1)
Lymph, poc: 2 (ref 0.6–3.4)
MCH, POC: 28.4 pg (ref 27–31.2)
MCHC: 30.5 g/dL — AB (ref 31.8–35.4)
MID (cbc): 0.6 (ref 0–0.9)
MPV: 9.9 fL (ref 0–99.8)
POC LYMPH PERCENT: 23.1 %L (ref 10–50)
POC MID %: 7 %M (ref 0–12)
RDW, POC: 13.8 %
WBC: 8.5 10*3/uL (ref 4.6–10.2)

## 2012-05-10 MED ORDER — SUCRALFATE 1 G PO TABS: 1.0000 g | ORAL_TABLET | Freq: Four times a day (QID) | ORAL | Status: DC

## 2012-05-10 MED ORDER — AZITHROMYCIN 500 MG PO TABS: 500.0000 mg | ORAL_TABLET | Freq: Every day | ORAL | Status: AC

## 2012-05-10 MED ORDER — HYDROCODONE-ACETAMINOPHEN 7.5-500 MG/15ML PO SOLN: 5.0000 mL | Freq: Four times a day (QID) | ORAL | Status: AC | PRN

## 2012-05-10 MED ORDER — SUCRALFATE 1 GM/10ML PO SUSP: 1.0000 g | Freq: Four times a day (QID) | ORAL | Status: DC

## 2012-05-10 NOTE — Progress Notes (Signed)
  Subjective:    Patient ID: Arthur [name], male    DOB: September 03, 1941, 71 y.o.   MRN: [id number]  HPI Has a few days of fever, st, painful swallowing, and chest pain. No diaphoresis, dizzy, syncope, or angina. Stat EKG as usual reveals LBBB, normal heart rate   Review of Systems same    Objective:   Physical Exam  Constitutional: He is oriented to person, place, and time. He appears well-developed and well-nourished.  HENT:  Right Ear: External ear normal.  Left Ear: External ear normal.  Nose: Nose normal.  Mouth/Throat: Oropharyngeal exudate present.  Eyes: EOM are normal. Pupils are equal, round, and reactive to light.  Neck: Normal range of motion. Neck supple. No thyromegaly present.  Cardiovascular: Normal rate, regular rhythm and normal heart sounds.   Pulmonary/Chest: Effort normal and breath sounds normal. He exhibits tenderness.  Abdominal: Soft. Bowel sounds are normal. He exhibits no mass. There is no tenderness.  Musculoskeletal: Normal range of motion.  Lymphadenopathy:    He has no cervical adenopathy.  Neurological: He is alert and oriented to person, place, and time.  Skin: Skin is warm and dry. No rash noted.  Psychiatric: He has a normal mood and affect. His behavior is normal. Judgment and thought content normal.   [facility] reading (PRIMARY) by  Dr.Maleiyah Releford cxr nad  Results for orders placed in visit on 05/10/12  POCT CBC      Component Value Range   WBC 8.5  4.6 - 10.2 K/uL   Lymph, poc 2.0  0.6 - 3.4   POC LYMPH PERCENT 23.1  10 - 50 %L   MID (cbc) 0.6  0 - 0.9   POC MID % 7.0  0 - 12 %M   POC Granulocyte 5.9  2 - 6.9   Granulocyte percent 69.9  37 - 80 %G   RBC 4.93  4.69 - 6.13 M/uL   Hemoglobin 14.1  14.1 - 18.1 g/dL   HCT, POC 78.2  95.6 - 53.7 %   MCV 93.4  80 - 97 fL   MCH, POC 28.4  27 - 31.2 pg   MCHC 30.5 (*) 31.8 - 35.4 g/dL   RDW, POC 21.3     Platelet Count, POC 269  142 - 424 K/uL   MPV 9.9  0 - 99.8 fL  POCT RAPID STREP A (OFFICE)   Component Value Range   Rapid Strep A Screen Negative  Negative          Assessment & Plan:  Bronchitis/pharyngitis Severe GERD

## 2012-05-10 NOTE — Patient Instructions (Signed)
Laryngitis  At the top of your windpipe is your voice box. It is the source of your voice. Inside your voice box are 2 bands of muscles called vocal cords. When you breathe, your vocal cords are relaxed and open so that air can get into the lungs. When you decide to say something, these cords come together and vibrate. The sound from these vibrations goes into your throat and comes out through your mouth as sound.  Laryngitis is an inflammation of the vocal cords that causes hoarseness, cough, loss of voice, sore throat, and dry throat. Laryngitis can be temporary (acute) or long-term (chronic). Most cases of acute laryngitis improve with time.Chronic laryngitis lasts for more than 3 weeks.  CAUSES  Laryngitis can often be related to excessive smoking, talking, or yelling, as well as inhalation of toxic fumes and allergies. Acute laryngitis is usually caused by a viral infection, vocal strain, measles or mumps, or bacterial infections. Chronic laryngitis is usually caused by vocal cord strain, vocal cord injury, postnasal drip, growths on the vocal cords, or acid reflux.  SYMPTOMS    Cough.   Sore throat.   Dry throat.  RISK FACTORS   Respiratory infections.   Exposure to irritating substances, such as cigarette smoke, excessive amounts of alcohol, stomach acids, and workplace chemicals.   Voice trauma, such as vocal cord injury from shouting or speaking too loud.  DIAGNOSIS   Your cargiver will perform a physical exam. During the physical exam, your caregiver will examine your throat. The most common sign of laryngitis is hoarseness. Laryngoscopy may be necessary to confirm the diagnosis of this condition. This procedure allows your caregiver to look into the larynx.  HOME CARE INSTRUCTIONS   Drink enough fluids to keep your urine clear or pale yellow.   Rest until you no longer have symptoms or as directed by your caregiver.   Breathe in moist air.   Take all medicine as directed by your  caregiver.   Do not smoke.   Talk as little as possible (this includes whispering).   Write on paper instead of talking until your voice is back to normal.   Follow up with your caregiver if your condition has not improved after 10 days.  SEEK MEDICAL CARE IF:    You have trouble breathing.   You cough up blood.   You have persistent fever.   You have increasing pain.   You have difficulty swallowing.  MAKE SURE YOU:   Understand these instructions.   Will watch your condition.   Will get help right away if you are not doing well or get worse.  Document Released: 09/19/2005 Document Revised: 09/08/2011 Document Reviewed: 11/25/2010  ExitCare Patient Information 2012 [employer].

## 2012-05-24 DIAGNOSIS — M1710 Unilateral primary osteoarthritis, unspecified knee: Secondary | ICD-10-CM | POA: Diagnosis not present

## 2012-07-30 DIAGNOSIS — M1710 Unilateral primary osteoarthritis, unspecified knee: Secondary | ICD-10-CM | POA: Diagnosis not present

## 2012-07-31 ENCOUNTER — Other Ambulatory Visit: Payer: Self-pay | Admitting: Orthopedic Surgery

## 2012-08-01 NOTE — Pre-Procedure Instructions (Signed)
20 VOYD GROFT  08/01/2012   Your procedure is scheduled on:  Friday, November 1st  Report to [facility] at 0530 AM.  Call this number if you have problems the morning of surgery: [phone]   Remember:   Do not eat food or drink:After Midnight.   Take these medicines the morning of surgery with A SIP OF WATER: synthroid, percocet if needed, protonix, carafate, ultram if needed   Do not wear jewelry, make-up or nail polish.  Do not wear lotions, powders, or perfumes.   Do not shave 48 hours prior to surgery. Men may shave face and neck.  Do not bring valuables to the hospital.  Contacts, dentures or bridgework may not be worn into surgery.  Leave suitcase in the car. After surgery it may be brought to your room.  For patients admitted to the hospital, checkout time is 11:00 AM the day of discharge.   Patients discharged the day of surgery will not be allowed to drive home.   Special Instructions: Shower using CHG 2 nights before surgery and the night before surgery.  If you shower the day of surgery use CHG.  Use special wash - you have one bottle of CHG for all showers.  You should use approximately 1/3 of the bottle for each shower.   Please read over the following fact sheets that you were given: Pain Booklet, Coughing and Deep Breathing, Blood Transfusion Information, Total Joint Packet, MRSA Information and Surgical Site Infection Prevention

## 2012-08-02 ENCOUNTER — Encounter (HOSPITAL_COMMUNITY)
Admission: RE | Admit: 2012-08-02 | Discharge: 2012-08-02 | Disposition: A | Discharge status: Home / Self Care | Payer: Medicare Other | Source: Ambulatory Visit | Attending: Orthopedic Surgery | Admitting: Orthopedic Surgery

## 2012-08-02 ENCOUNTER — Encounter (HOSPITAL_COMMUNITY): Payer: Self-pay | Admitting: Pharmacy Technician

## 2012-08-02 ENCOUNTER — Encounter (HOSPITAL_COMMUNITY): Payer: Self-pay

## 2012-08-02 LAB — TYPE AND SCREEN: ABO/RH(D): A NEG

## 2012-08-02 LAB — COMPREHENSIVE METABOLIC PANEL
AST: 18 U/L (ref 0–37)
BUN: 20 mg/dL (ref 6–23)
CO2: 32 mEq/L (ref 19–32)
Calcium: 10 mg/dL (ref 8.4–10.5)
Creatinine, Ser: 0.88 mg/dL (ref 0.50–1.35)
GFR calc non Af Amer: 85 mL/min — ABNORMAL LOW (ref >90)

## 2012-08-02 LAB — PROTIME-INR
INR: 1.02 (ref 0.00–1.49)
Prothrombin Time: 13.3 seconds (ref 11.6–15.2)

## 2012-08-02 LAB — CBC WITH DIFFERENTIAL/PLATELET
Basophils Absolute: 0 10*3/uL (ref 0.0–0.1)
Basophils Relative: 0 % (ref 0–1)
Eosinophils Relative: 4 % (ref 0–5)
HCT: 43.2 % (ref 39.0–52.0)
Lymphocytes Relative: 23 % (ref 12–46)
MCHC: 33.3 g/dL (ref 30.0–36.0)
MCV: 90.2 fL (ref 78.0–100.0)
Monocytes Absolute: 0.9 10*3/uL (ref 0.1–1.0)
RDW: 12.7 % (ref 11.5–15.5)

## 2012-08-02 LAB — URINALYSIS, ROUTINE W REFLEX MICROSCOPIC
Bilirubin Urine: NEGATIVE
Glucose, UA: NEGATIVE mg/dL
Hgb urine dipstick: NEGATIVE
Ketones, ur: NEGATIVE mg/dL
Leukocytes, UA: NEGATIVE
Protein, ur: NEGATIVE mg/dL

## 2012-08-02 MED ORDER — DEXTROSE 5 % IV SOLN
3.0000 g | INTRAVENOUS | Status: AC
  Administered 2012-08-03: 3 g via INTRAVENOUS
  Filled 2012-08-02: qty 3000

## 2012-08-02 MED ORDER — POVIDONE-IODINE 7.5 % EX SOLN
Freq: Once | CUTANEOUS | Status: DC
  Filled 2012-08-02: qty 118

## 2012-08-02 NOTE — Consult Note (Signed)
Anesthesia chart review: Patient is a 71 year old male posted for a left total knee arthroplasty on 08/03/2012 by Dr. Luiz Blare. His PAT visit was not until 08/02/12 morning, prior to my arrival to the hospital.  History includes smoking, thyroid cancer status post thyroidectomy, secondary hypothyroidism, prostate cancer status post prostatectomy, hypertension, migraines, ED, hypercholesterolemia, reflux esophagitis, COPD, GERD, nonischemic cardiomyopathy, left bundle branch block, right TKA 01/2010.  PCP is Dr. Robert Bellow.  His Cardiologist is Dr. [name], but he has not been seen since April 2010.  (The last documentation that I see from his is a clearance note for patient's prior TKA in 2011.)  His last CXR and EKG were done in August of 2013 for evaluation for bronchitis/pharyngitis.  EKG on 05/10/12 showed SR, first degree AVB, left BBB. I think his EKG appears stable since at least 07/01/09.  Echo on 01/20/09 showed: 1. Left ventricle: The cavity size was normal. Wall thickness was normal. Systolic function was mildly reduced. The estimated ejection fraction was 45%. Mild diffuse hypokinesis. Doppler parameters are consistent with abnormal left ventricular relaxation (grade 1 diastolic dysfunction). 2. Ventricular septum: Septal motion showed mild dyssynergy. 3. Aortic valve: There is significant atheroma in the visualized descending aorta at the level of the diaphragm. 4. Left atrium: The atrium was mildly dilated. 5. Right atrium: The atrium was mildly dilated. 6. Pulmonary arteries: Estimated PA systolic pressure 29-33 mmHg. 7. Systemic veins: The IVC measures 2.0 cm with normal respirophasic variation. EstimatedRA pressure 6-10 mmHg. Impressions: - The LV is normal in size with mild global hypokinesis, EF 45%. There is mild septal dyssynergy suggestive of interventricular conduction delay. There are no significant valvular abnormalities. There is prominent atheroma in the descending  aorta.  Cardiac cath on 12/13/07 showed normal coronaries with RAO ventriculography showing mild global hypokinesis, somewhat worse in the anterior apex. EF approximately 45%. (Dr. [name])   Medications listed included levothyroxine, pantoprazole, Maxalt PRN, Cialis PRN, tramadol PRN.  CXR from 05/10/12 showed: Prior surgery lower cervical spine and neck. Peribronchial thickening stable suggesting chronic obstructive type  changes. No infiltrate, congestive heart failure or pneumothorax. Heart size within normal limits. Calcified slightly tortuous aorta.  Labs noted.  I reviewed above with Anesthesiologist Dr. Krista Blue.  Clinical correlation on the day of surgery.  If no acute CV/CHF symptoms or worrisome CV exam findings then anticipate he can proceed as planned--otherwise would need further evaluation by cardiology preoperatively.  Currently patient is listed as a first case, and it doesn't appear that Dr. Darene Lamer has a second case.  (I left an update via voicemail for Darl Pikes at Dr. Veda Canning office regarding my conversation with our anesthesiologist.)  Shonna Chock, PA-C

## 2012-08-03 ENCOUNTER — Inpatient Hospital Stay (HOSPITAL_COMMUNITY)
Admission: RE | Admit: 2012-08-03 | Discharge: 2012-08-06 | DRG: 470 | Disposition: A | Discharge status: Home Health | Payer: Medicare Other | Source: Ambulatory Visit | Attending: Orthopedic Surgery | Admitting: Orthopedic Surgery

## 2012-08-03 ENCOUNTER — Encounter (HOSPITAL_COMMUNITY): Payer: Self-pay | Admitting: Vascular Surgery

## 2012-08-03 ENCOUNTER — Inpatient Hospital Stay (HOSPITAL_COMMUNITY): Payer: Medicare Other | Admitting: Vascular Surgery

## 2012-08-03 ENCOUNTER — Encounter (HOSPITAL_COMMUNITY)
Admission: RE | Disposition: A | Discharge status: Home Health | Payer: Self-pay | Source: Ambulatory Visit | Attending: Orthopedic Surgery

## 2012-08-03 ENCOUNTER — Encounter (HOSPITAL_COMMUNITY): Payer: Self-pay | Admitting: *Deleted

## 2012-08-03 DIAGNOSIS — Z791 Long term (current) use of non-steroidal anti-inflammatories (NSAID): Secondary | ICD-10-CM | POA: Diagnosis not present

## 2012-08-03 DIAGNOSIS — G8918 Other acute postprocedural pain: Secondary | ICD-10-CM | POA: Diagnosis not present

## 2012-08-03 DIAGNOSIS — Z8546 Personal history of malignant neoplasm of prostate: Secondary | ICD-10-CM | POA: Diagnosis not present

## 2012-08-03 DIAGNOSIS — Z860100 Personal history of colon polyps, unspecified: Secondary | ICD-10-CM

## 2012-08-03 DIAGNOSIS — Z9079 Acquired absence of other genital organ(s): Secondary | ICD-10-CM

## 2012-08-03 DIAGNOSIS — K219 Gastro-esophageal reflux disease without esophagitis: Secondary | ICD-10-CM | POA: Diagnosis present

## 2012-08-03 DIAGNOSIS — I447 Left bundle-branch block, unspecified: Secondary | ICD-10-CM | POA: Diagnosis present

## 2012-08-03 DIAGNOSIS — Z9089 Acquired absence of other organs: Secondary | ICD-10-CM

## 2012-08-03 DIAGNOSIS — I10 Essential (primary) hypertension: Secondary | ICD-10-CM | POA: Diagnosis present

## 2012-08-03 DIAGNOSIS — Z881 Allergy status to other antibiotic agents status: Secondary | ICD-10-CM | POA: Diagnosis not present

## 2012-08-03 DIAGNOSIS — J449 Chronic obstructive pulmonary disease, unspecified: Secondary | ICD-10-CM | POA: Diagnosis present

## 2012-08-03 DIAGNOSIS — IMO0002 Reserved for concepts with insufficient information to code with codable children: Secondary | ICD-10-CM | POA: Diagnosis not present

## 2012-08-03 DIAGNOSIS — E039 Hypothyroidism, unspecified: Secondary | ICD-10-CM | POA: Diagnosis present

## 2012-08-03 DIAGNOSIS — M1710 Unilateral primary osteoarthritis, unspecified knee: Secondary | ICD-10-CM | POA: Diagnosis not present

## 2012-08-03 DIAGNOSIS — Z85850 Personal history of malignant neoplasm of thyroid: Secondary | ICD-10-CM

## 2012-08-03 DIAGNOSIS — Z79899 Other long term (current) drug therapy: Secondary | ICD-10-CM

## 2012-08-03 DIAGNOSIS — Z96659 Presence of unspecified artificial knee joint: Secondary | ICD-10-CM

## 2012-08-03 DIAGNOSIS — Z01812 Encounter for preprocedural laboratory examination: Secondary | ICD-10-CM

## 2012-08-03 DIAGNOSIS — F17200 Nicotine dependence, unspecified, uncomplicated: Secondary | ICD-10-CM | POA: Diagnosis present

## 2012-08-03 DIAGNOSIS — I428 Other cardiomyopathies: Secondary | ICD-10-CM | POA: Diagnosis present

## 2012-08-03 DIAGNOSIS — E7800 Pure hypercholesterolemia, unspecified: Secondary | ICD-10-CM | POA: Diagnosis present

## 2012-08-03 DIAGNOSIS — Z8249 Family history of ischemic heart disease and other diseases of the circulatory system: Secondary | ICD-10-CM

## 2012-08-03 DIAGNOSIS — M1712 Unilateral primary osteoarthritis, left knee: Secondary | ICD-10-CM | POA: Diagnosis present

## 2012-08-03 DIAGNOSIS — N529 Male erectile dysfunction, unspecified: Secondary | ICD-10-CM | POA: Diagnosis present

## 2012-08-03 DIAGNOSIS — J4489 Other specified chronic obstructive pulmonary disease: Secondary | ICD-10-CM | POA: Diagnosis present

## 2012-08-03 HISTORY — PX: TOTAL KNEE ARTHROPLASTY: SHX125

## 2012-08-03 SURGERY — ARTHROPLASTY, KNEE, TOTAL
Anesthesia: General | Site: Knee | Laterality: Left | Wound class: Clean

## 2012-08-03 MED ORDER — FERROUS SULFATE 325 (65 FE) MG PO TABS
325.0000 mg | ORAL_TABLET | Freq: Two times a day (BID) | ORAL | Status: DC
  Administered 2012-08-03 – 2012-08-06 (×6): 325 mg via ORAL
  Filled 2012-08-03 (×9): qty 1

## 2012-08-03 MED ORDER — ONDANSETRON HCL 4 MG/2ML IJ SOLN
INTRAMUSCULAR | Status: DC | PRN
  Administered 2012-08-03: 4 mg via INTRAVENOUS

## 2012-08-03 MED ORDER — WARFARIN SODIUM 7.5 MG PO TABS
7.5000 mg | ORAL_TABLET | Freq: Once | ORAL | Status: AC
  Administered 2012-08-03: 7.5 mg via ORAL
  Filled 2012-08-03: qty 1

## 2012-08-03 MED ORDER — COUMADIN BOOK
Freq: Once | Status: AC
  Administered 2012-08-03: 17:00:00
  Filled 2012-08-03: qty 1

## 2012-08-03 MED ORDER — BUPIVACAINE HCL (PF) 0.25 % IJ SOLN: INTRAMUSCULAR | Status: DC | PRN

## 2012-08-03 MED ORDER — CEFAZOLIN SODIUM-DEXTROSE 2-3 GM-% IV SOLR
2.0000 g | Freq: Four times a day (QID) | INTRAVENOUS | Status: AC
  Administered 2012-08-03 (×2): 2 g via INTRAVENOUS
  Filled 2012-08-03 (×2): qty 50

## 2012-08-03 MED ORDER — HYDROMORPHONE HCL PF 1 MG/ML IJ SOLN
1.0000 mg | INTRAMUSCULAR | Status: DC | PRN
  Administered 2012-08-03: 1 mg via INTRAVENOUS
  Filled 2012-08-03: qty 1

## 2012-08-03 MED ORDER — MIDAZOLAM HCL 5 MG/5ML IJ SOLN
INTRAMUSCULAR | Status: DC | PRN
  Administered 2012-08-03: 2 mg via INTRAVENOUS

## 2012-08-03 MED ORDER — ZOLPIDEM TARTRATE 5 MG PO TABS: 5.0000 mg | ORAL_TABLET | Freq: Every evening | ORAL | Status: DC | PRN

## 2012-08-03 MED ORDER — PROPOFOL 10 MG/ML IV BOLUS
INTRAVENOUS | Status: DC | PRN
  Administered 2012-08-03: 200 mg via INTRAVENOUS

## 2012-08-03 MED ORDER — HYDROMORPHONE HCL PF 1 MG/ML IJ SOLN
INTRAMUSCULAR | Status: AC
  Filled 2012-08-03: qty 1

## 2012-08-03 MED ORDER — HYDROMORPHONE HCL PF 1 MG/ML IJ SOLN
0.2500 mg | INTRAMUSCULAR | Status: DC | PRN
  Administered 2012-08-03 (×4): 0.5 mg via INTRAVENOUS

## 2012-08-03 MED ORDER — ONDANSETRON HCL 4 MG PO TABS: 4.0000 mg | ORAL_TABLET | Freq: Four times a day (QID) | ORAL | Status: DC | PRN

## 2012-08-03 MED ORDER — PROMETHAZINE HCL 25 MG/ML IJ SOLN: 6.2500 mg | INTRAMUSCULAR | Status: DC | PRN

## 2012-08-03 MED ORDER — OXYCODONE HCL 5 MG PO TABS
5.0000 mg | ORAL_TABLET | ORAL | Status: DC | PRN
  Administered 2012-08-03: 5 mg via ORAL
  Administered 2012-08-03 – 2012-08-06 (×16): 10 mg via ORAL
  Filled 2012-08-03 (×16): qty 2

## 2012-08-03 MED ORDER — OXYCODONE HCL 5 MG/5ML PO SOLN: 5.0000 mg | Freq: Once | ORAL | Status: AC | PRN

## 2012-08-03 MED ORDER — WARFARIN - PHARMACIST DOSING INPATIENT: Freq: Every day | Status: DC

## 2012-08-03 MED ORDER — METOCLOPRAMIDE HCL 10 MG PO TABS: 5.0000 mg | ORAL_TABLET | Freq: Three times a day (TID) | ORAL | Status: DC | PRN

## 2012-08-03 MED ORDER — METOCLOPRAMIDE HCL 5 MG/ML IJ SOLN: 5.0000 mg | Freq: Three times a day (TID) | INTRAMUSCULAR | Status: DC | PRN

## 2012-08-03 MED ORDER — DEXAMETHASONE SODIUM PHOSPHATE 4 MG/ML IJ SOLN
INTRAMUSCULAR | Status: DC | PRN
  Administered 2012-08-03: 10 mg

## 2012-08-03 MED ORDER — METHOCARBAMOL 500 MG PO TABS
500.0000 mg | ORAL_TABLET | Freq: Four times a day (QID) | ORAL | Status: DC | PRN
  Administered 2012-08-04 – 2012-08-06 (×8): 500 mg via ORAL
  Filled 2012-08-03 (×9): qty 1

## 2012-08-03 MED ORDER — BUPIVACAINE-EPINEPHRINE PF 0.5-1:200000 % IJ SOLN
INTRAMUSCULAR | Status: DC | PRN
  Administered 2012-08-03: 150 mg

## 2012-08-03 MED ORDER — FLEET ENEMA 7-19 GM/118ML RE ENEM: 1.0000 | ENEMA | Freq: Once | RECTAL | Status: AC | PRN

## 2012-08-03 MED ORDER — LACTATED RINGERS IV SOLN
INTRAVENOUS | Status: DC | PRN
  Administered 2012-08-03 (×2): via INTRAVENOUS

## 2012-08-03 MED ORDER — FENTANYL CITRATE 0.05 MG/ML IJ SOLN
INTRAMUSCULAR | Status: DC | PRN
  Administered 2012-08-03: 50 ug via INTRAVENOUS
  Administered 2012-08-03: 100 ug via INTRAVENOUS
  Administered 2012-08-03 (×2): 50 ug via INTRAVENOUS

## 2012-08-03 MED ORDER — ACETAMINOPHEN 10 MG/ML IV SOLN
1000.0000 mg | Freq: Four times a day (QID) | INTRAVENOUS | Status: AC
  Administered 2012-08-03 – 2012-08-04 (×3): 1000 mg via INTRAVENOUS
  Filled 2012-08-03 (×4): qty 100

## 2012-08-03 MED ORDER — WARFARIN VIDEO
Freq: Once | Status: AC
  Administered 2012-08-03: 17:00:00

## 2012-08-03 MED ORDER — CEFUROXIME SODIUM 1.5 G IJ SOLR
INTRAMUSCULAR | Status: DC | PRN
  Administered 2012-08-03: 1.5 g

## 2012-08-03 MED ORDER — ACETAMINOPHEN 10 MG/ML IV SOLN
INTRAVENOUS | Status: DC | PRN
  Administered 2012-08-03: 1000 mg via INTRAVENOUS

## 2012-08-03 MED ORDER — KETOROLAC TROMETHAMINE 15 MG/ML IJ SOLN
7.5000 mg | Freq: Four times a day (QID) | INTRAMUSCULAR | Status: AC
  Administered 2012-08-03 – 2012-08-04 (×3): 7.5 mg via INTRAVENOUS
  Filled 2012-08-03 (×4): qty 1

## 2012-08-03 MED ORDER — DEXTROSE-NACL 5-0.45 % IV SOLN
INTRAVENOUS | Status: DC
  Administered 2012-08-03: 10:00:00 via INTRAVENOUS

## 2012-08-03 MED ORDER — OXYCODONE HCL 5 MG PO TABS
ORAL_TABLET | ORAL | Status: AC
  Filled 2012-08-03: qty 1

## 2012-08-03 MED ORDER — GLYCOPYRROLATE 0.2 MG/ML IJ SOLN
INTRAMUSCULAR | Status: DC | PRN
  Administered 2012-08-03: 0.2 mg via INTRAVENOUS

## 2012-08-03 MED ORDER — OXYCODONE HCL 5 MG PO TABS
5.0000 mg | ORAL_TABLET | Freq: Once | ORAL | Status: AC | PRN
  Administered 2012-08-03: 5 mg via ORAL

## 2012-08-03 MED ORDER — DEXTROSE 5 % IV SOLN
500.0000 mg | Freq: Four times a day (QID) | INTRAVENOUS | Status: DC | PRN
  Administered 2012-08-03: 500 mg via INTRAVENOUS
  Filled 2012-08-03: qty 5

## 2012-08-03 MED ORDER — SUMATRIPTAN SUCCINATE 100 MG PO TABS
100.0000 mg | ORAL_TABLET | ORAL | Status: DC | PRN
  Filled 2012-08-03: qty 1

## 2012-08-03 MED ORDER — DOCUSATE SODIUM 100 MG PO CAPS
100.0000 mg | ORAL_CAPSULE | Freq: Two times a day (BID) | ORAL | Status: DC
  Administered 2012-08-03 – 2012-08-06 (×6): 100 mg via ORAL
  Filled 2012-08-03 (×8): qty 1

## 2012-08-03 MED ORDER — POLYETHYLENE GLYCOL 3350 17 G PO PACK: 17.0000 g | PACK | Freq: Every day | ORAL | Status: DC | PRN

## 2012-08-03 MED ORDER — ACETAMINOPHEN 10 MG/ML IV SOLN
INTRAVENOUS | Status: AC
  Filled 2012-08-03: qty 100

## 2012-08-03 MED ORDER — ONDANSETRON HCL 4 MG/2ML IJ SOLN
4.0000 mg | Freq: Four times a day (QID) | INTRAMUSCULAR | Status: DC | PRN
  Administered 2012-08-05: 4 mg via INTRAVENOUS
  Filled 2012-08-03: qty 2

## 2012-08-03 MED ORDER — EPHEDRINE SULFATE 50 MG/ML IJ SOLN
INTRAMUSCULAR | Status: DC | PRN
  Administered 2012-08-03 (×2): 5 mg via INTRAVENOUS
  Administered 2012-08-03: 10 mg via INTRAVENOUS

## 2012-08-03 MED ORDER — SODIUM CHLORIDE 0.9 % IR SOLN
Status: DC | PRN
  Administered 2012-08-03: 1000 mL

## 2012-08-03 MED ORDER — ALUM & MAG HYDROXIDE-SIMETH 200-200-20 MG/5ML PO SUSP
30.0000 mL | ORAL | Status: DC | PRN
  Administered 2012-08-04: 30 mL via ORAL
  Filled 2012-08-03: qty 30

## 2012-08-03 MED ORDER — PANTOPRAZOLE SODIUM 40 MG PO TBEC
40.0000 mg | DELAYED_RELEASE_TABLET | Freq: Every day | ORAL | Status: DC
  Administered 2012-08-03 – 2012-08-06 (×4): 40 mg via ORAL
  Filled 2012-08-03 (×4): qty 1

## 2012-08-03 MED ORDER — LEVOTHYROXINE SODIUM 200 MCG PO TABS
200.0000 ug | ORAL_TABLET | Freq: Every day | ORAL | Status: DC
  Administered 2012-08-04 – 2012-08-06 (×3): 200 ug via ORAL
  Filled 2012-08-03 (×5): qty 1

## 2012-08-03 MED ORDER — BUPIVACAINE HCL (PF) 0.25 % IJ SOLN
INTRAMUSCULAR | Status: AC
  Filled 2012-08-03: qty 30

## 2012-08-03 MED ORDER — CEFUROXIME SODIUM 1.5 G IJ SOLR
INTRAMUSCULAR | Status: AC
  Filled 2012-08-03: qty 1.5

## 2012-08-03 MED ORDER — PHENYLEPHRINE HCL 10 MG/ML IJ SOLN
INTRAMUSCULAR | Status: DC | PRN
  Administered 2012-08-03 (×3): 40 ug via INTRAVENOUS
  Administered 2012-08-03: 80 ug via INTRAVENOUS

## 2012-08-03 SURGICAL SUPPLY — 60 items
BANDAGE ESMARK 6X9 LF (GAUZE/BANDAGES/DRESSINGS) ×1 IMPLANT
BENZOIN TINCTURE PRP APPL 2/3 (GAUZE/BANDAGES/DRESSINGS) ×2 IMPLANT
BLADE SAGITTAL 25.0X1.19X90 (BLADE) ×2 IMPLANT
BLADE SAW SAG 90X13X1.27 (BLADE) ×2 IMPLANT
BNDG ESMARK 6X9 LF (GAUZE/BANDAGES/DRESSINGS) ×2
BOWL SMART MIX CTS (DISPOSABLE) ×2 IMPLANT
CEMENT HV SMART SET (Cement) ×2 IMPLANT
CLOTH BEACON ORANGE TIMEOUT ST (SAFETY) ×2 IMPLANT
CLSR STERI-STRIP ANTIMIC 1/2X4 (GAUZE/BANDAGES/DRESSINGS) ×2 IMPLANT
COVER BACK TABLE 24X17X13 BIG (DRAPES) ×2 IMPLANT
COVER SURGICAL LIGHT HANDLE (MISCELLANEOUS) ×2 IMPLANT
CUFF TOURNIQUET SINGLE 34IN LL (TOURNIQUET CUFF) ×2 IMPLANT
CUFF TOURNIQUET SINGLE 44IN (TOURNIQUET CUFF) IMPLANT
DRAPE EXTREMITY T 121X128X90 (DRAPE) ×2 IMPLANT
DRAPE U-SHAPE 47X51 STRL (DRAPES) ×2 IMPLANT
DRSG PAD ABDOMINAL 8X10 ST (GAUZE/BANDAGES/DRESSINGS) ×2 IMPLANT
DURAPREP 26ML APPLICATOR (WOUND CARE) ×2 IMPLANT
ELECT REM PT RETURN 9FT ADLT (ELECTROSURGICAL) ×2
ELECTRODE REM PT RTRN 9FT ADLT (ELECTROSURGICAL) ×1 IMPLANT
EVACUATOR 1/8 PVC DRAIN (DRAIN) ×2 IMPLANT
FACESHIELD LNG OPTICON STERILE (SAFETY) ×2 IMPLANT
GAUZE XEROFORM 5X9 LF (GAUZE/BANDAGES/DRESSINGS) ×2 IMPLANT
GLOVE BIOGEL PI IND STRL 8 (GLOVE) ×2 IMPLANT
GLOVE BIOGEL PI INDICATOR 8 (GLOVE) ×2
GLOVE ECLIPSE 7.5 STRL STRAW (GLOVE) ×4 IMPLANT
GOWN PREVENTION PLUS LG XLONG (DISPOSABLE) IMPLANT
GOWN STRL NON-REIN LRG LVL3 (GOWN DISPOSABLE) ×2 IMPLANT
GOWN STRL REIN XL XLG (GOWN DISPOSABLE) ×4 IMPLANT
HANDPIECE INTERPULSE COAX TIP (DISPOSABLE) ×1
HOOD PEEL AWAY FACE SHEILD DIS (HOOD) ×6 IMPLANT
IMMOBILIZER KNEE 20 (SOFTGOODS)
IMMOBILIZER KNEE 20 THIGH 36 (SOFTGOODS) IMPLANT
IMMOBILIZER KNEE 22 UNIV (SOFTGOODS) ×2 IMPLANT
IMMOBILIZER KNEE 24 THIGH 36 (MISCELLANEOUS) IMPLANT
IMMOBILIZER KNEE 24 UNIV (MISCELLANEOUS)
KIT BASIN OR (CUSTOM PROCEDURE TRAY) ×2 IMPLANT
KIT ROOM TURNOVER OR (KITS) ×2 IMPLANT
MANIFOLD NEPTUNE II (INSTRUMENTS) ×2 IMPLANT
NEEDLE HYPO 25GX1X1/2 BEV (NEEDLE) ×2 IMPLANT
NS IRRIG 1000ML POUR BTL (IV SOLUTION) ×2 IMPLANT
PACK TOTAL JOINT (CUSTOM PROCEDURE TRAY) ×2 IMPLANT
PAD ARMBOARD 7.5X6 YLW CONV (MISCELLANEOUS) ×4 IMPLANT
PAD CAST 4YDX4 CTTN HI CHSV (CAST SUPPLIES) ×1 IMPLANT
PADDING CAST COTTON 4X4 STRL (CAST SUPPLIES) ×1
SET HNDPC FAN SPRY TIP SCT (DISPOSABLE) ×1 IMPLANT
SPONGE GAUZE 4X4 12PLY (GAUZE/BANDAGES/DRESSINGS) ×2 IMPLANT
STAPLER VISISTAT 35W (STAPLE) ×2 IMPLANT
STRIP CLOSURE SKIN 1/2X4 (GAUZE/BANDAGES/DRESSINGS) ×2 IMPLANT
SUCTION FRAZIER TIP 10 FR DISP (SUCTIONS) ×2 IMPLANT
SUT MON AB 3-0 SH 27 (SUTURE)
SUT MON AB 3-0 SH27 (SUTURE) IMPLANT
SUT VIC AB 0 CTB1 27 (SUTURE) ×4 IMPLANT
SUT VIC AB 1 CT1 27 (SUTURE) ×2
SUT VIC AB 1 CT1 27XBRD ANBCTR (SUTURE) ×2 IMPLANT
SUT VIC AB 2-0 CTB1 (SUTURE) ×4 IMPLANT
SYR CONTROL 10ML LL (SYRINGE) IMPLANT
TOWEL OR 17X24 6PK STRL BLUE (TOWEL DISPOSABLE) ×2 IMPLANT
TOWEL OR 17X26 10 PK STRL BLUE (TOWEL DISPOSABLE) ×2 IMPLANT
TRAY FOLEY CATH 14FR (SET/KITS/TRAYS/PACK) ×2 IMPLANT
WATER STERILE IRR 1000ML POUR (IV SOLUTION) ×6 IMPLANT

## 2012-08-03 NOTE — Preoperative (Signed)
Beta Blockers   Reason not to administer Beta Blockers:Not Applicable 

## 2012-08-03 NOTE — Progress Notes (Signed)
Orthopedic Tech Progress Note Patient Details:  Arthur [name] 12/16/1940 [id number]  Ortho Devices Type of Ortho Device: Knee Immobilizer Ortho Device/Splint Location: left leg Ortho Device/Splint Interventions: Application   [name] 08/03/2012, 5:08 PM

## 2012-08-03 NOTE — Anesthesia Preprocedure Evaluation (Addendum)
Anesthesia Evaluation  Patient identified by MRN, date of birth, ID band Patient awake    Reviewed: Allergy & Precautions, H&P , NPO status , Patient's Chart, lab work & pertinent test results  Airway Mallampati: I TM Distance: >3 FB Neck ROM: full    Dental  (+) Teeth Intact and Dental Advidsory Given   Pulmonary Current Smoker,    Pulmonary exam normal       Cardiovascular hypertension, + dysrhythmias (Left BBB)     Neuro/Psych  Headaches,    GI/Hepatic GERD-  Medicated and Controlled,  Endo/Other  Hypothyroidism   Renal/GU      Musculoskeletal   Abdominal   Peds  Hematology   Anesthesia Other Findings   Reproductive/Obstetrics                       Anesthesia Physical Anesthesia Plan  ASA: III  Anesthesia Plan: General   Post-op Pain Management:    Induction: Intravenous  Airway Management Planned: LMA  Additional Equipment:   Intra-op Plan:   Post-operative Plan: Extubation in OR  Informed Consent: I have reviewed the patients History and Physical, chart, labs and discussed the procedure including the risks, benefits and alternatives for the proposed anesthesia with the patient or authorized representative who has indicated his/her understanding and acceptance.   Dental advisory given  Plan Discussed with: CRNA, Anesthesiologist and Surgeon  Anesthesia Plan Comments:         Anesthesia Quick Evaluation

## 2012-08-03 NOTE — Transfer of Care (Signed)
Immediate Anesthesia Transfer of Care Note  Patient: Arthur [name]  Procedure(s) Performed: Procedure(s) (LRB) with comments: TOTAL KNEE ARTHROPLASTY (Left)  Patient Location: PACU  Anesthesia Type:General  Level of Consciousness: awake, alert  and oriented  Airway & Oxygen Therapy: Patient Spontanous Breathing and Patient connected to nasal cannula oxygen  Post-op Assessment: Report given to PACU RN, Post -op Vital signs reviewed and stable and Patient moving all extremities X 4  Post vital signs: Reviewed and stable  Complications: No apparent anesthesia complications

## 2012-08-03 NOTE — Progress Notes (Addendum)
ANTICOAGULATION CONSULT NOTE - Initial Consult  Pharmacy Consult for Coumadin Indication: VTE prophylaxis  Allergies  Allergen Reactions  . Doxycycline     Esophagitis severe gi bleed    Patient Measurements:    Vital Signs: Temp: 97 F (36.1 C) (11/01 1115) Temp src: Oral (11/01 0601) BP: 135/72 mmHg (11/01 1130) Pulse Rate: 70  (11/01 1130)  Labs:  [facility] 08/02/12 0823  HGB 14.4  HCT 43.2  PLT 245  APTT 30  LABPROT 13.3  INR 1.02  HEPARINUNFRC --  CREATININE 0.88  CKTOTAL --  CKMB --  TROPONINI --    The CrCl is unknown because both a height and weight (above a minimum accepted value) are required for this calculation.   Medical History: Past Medical History  Diagnosis Date  . LBBB (left bundle branch block) 10/2007  . GERD (gastroesophageal reflux disease)   . Migraines   . COPD (chronic obstructive pulmonary disease)   . Thyroid cancer   . Tobacco use disorder   . ED (erectile dysfunction)   . Unspecified hypothyroidism   . Hypercholesterolemia   . Nonischemic cardiomyopathy   . Arthritis   . Prostate cancer   . Reflux esophagitis   . Essential hypertension, benign     chris guess  pcp    Medications:  Prescriptions prior to admission  Medication Sig Dispense Refill  . levothyroxine (SYNTHROID) 200 MCG tablet Take 1 tablet (200 mcg total) by mouth daily.  90 tablet  0  . pantoprazole (PROTONIX) 40 MG tablet Take 1 tablet (40 mg total) by mouth daily. 30 minutes before breakfast  30 tablet  11  . rizatriptan (MAXALT) 10 MG tablet Take 1 tablet (10 mg total) by mouth as needed. May repeat in 2 hours if needed  10 tablet  3  . tadalafil (CIALIS) 5 MG tablet Take 5 mg by mouth daily as needed. For erectile dysfunction      . traMADol (ULTRAM) 50 MG tablet Take 50 mg by mouth 2 (two) times daily.       Scheduled:    . acetaminophen  1,000 mg Intravenous Q6H  .  ceFAZolin (ANCEF) IV  3 g Intravenous 60 min Pre-Op  .  ceFAZolin (ANCEF) IV  2 g  Intravenous Q6H  . docusate sodium  100 mg Oral BID  . ferrous sulfate  325 mg Oral BID WC  . HYDROmorphone      . HYDROmorphone      . ketorolac  7.5 mg Intravenous Q6H  . levothyroxine  200 mcg Oral QAC breakfast  . oxyCODONE      . oxyCODONE      . pantoprazole  40 mg Oral Daily  . DISCONTD: povidone-iodine   Topical Once    Assessment: 71 yo caucasian male s/p left TKA to initiate Coumadin for VTE prophylaxis. Baseline INR 1.02 with H/H 14.4/43.2 and Plt 245 (08/02/12). No overt bleeding noted post-op per nurse.   Goal of Therapy:  INR 2-3 Monitor platelets by anticoagulation protocol: Yes   Plan:  Coumadin 7.5mg  PO X 1 INR, CBC QAM Monitor s/sx of bleeding Coumadin education book and video ordered.  Coumadin education and handout provided and patient verbalized understanding.   Tiney Rouge, PharmD Candidate 08/03/2012,1:46 PM

## 2012-08-03 NOTE — Progress Notes (Signed)
Orthopedic Tech Progress Note Patient Details:  Arthur [name] [birth year] [id number] CPM applied to Left LE with appropriate settings. OHF with trapeze bar applied to bed. CPM Left Knee CPM Left Knee: On Left Knee Flexion (Degrees): 60  Left Knee Extension (Degrees): 0    [name] 08/03/2012, 10:58 AM
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
## 2012-08-03 NOTE — Progress Notes (Signed)
UR COMPLETED  

## 2012-08-03 NOTE — Anesthesia Procedure Notes (Addendum)
Anesthesia Regional Block:  Femoral nerve block  Pre-Anesthetic Checklist: ,, timeout performed, Correct Patient, Correct Site, Correct Laterality, Correct Procedure,, site marked, risks and benefits discussed, Surgical consent,  Pre-op evaluation,  At surgeon's request and post-op pain management  Laterality: Left  Prep: chloraprep       Needles:  Injection technique: Single-shot  Needle Type: Echogenic Stimulator Needle     Needle Length: 5cm 5 cm Needle Gauge: 22 and 22 G    Additional Needles:  Procedures: ultrasound guided (picture in chart) and nerve stimulator Femoral nerve block  Nerve Stimulator or Paresthesia:  Response: quadraceps contraction, 0.45 mA,   Additional Responses:   Narrative:  Start time: 08/03/2012 6:56 AM End time: 08/03/2012 7:06 AM Injection made incrementally with aspirations every 5 mL.  Performed by: Personally  Anesthesiologist: Halford Decamp, MD  Additional Notes: Functioning IV was confirmed and monitors were applied.  A 50mm 22ga Arrow echogenic stimulator needle was used. Sterile prep and drape,hand hygiene and sterile gloves were used. Ultrasound guidance: relevant anatomy identified, needle position confirmed, local anesthetic spread visualized around nerve(s)., vascular puncture avoided.  Image printed for medical record. Negative aspiration and negative test dose prior to incremental administration of local anesthetic. The patient tolerated the procedure well.    Femoral nerve block Procedure Name: LMA Insertion Date/Time: 08/03/2012 7:47 AM Performed by: Rossie Muskrat L Pre-anesthesia Checklist: Patient identified, Timeout performed, Emergency Drugs available, Suction available and Patient being monitored Patient Re-evaluated:Patient Re-evaluated prior to inductionOxygen Delivery Method: Circle system utilized Preoxygenation: Pre-oxygenation with 100% oxygen Intubation Type: IV induction Ventilation: Mask ventilation without  difficulty LMA: LMA inserted LMA Size: 5.0 Number of attempts: 1 Placement Confirmation: breath sounds checked- equal and bilateral and positive ETCO2 Tube secured with: Tape Dental Injury: Teeth and Oropharynx as per pre-operative assessment

## 2012-08-03 NOTE — Progress Notes (Signed)
Agree with plan as written; discussed with Tiney Rouge, PharmD Candidate.   Nicolasa Ducking, PharmD Clinical Pharmacist Pgr [phone] 08/03/2012 2:50 PM

## 2012-08-03 NOTE — Progress Notes (Signed)
Orthopedic Tech Progress Note Patient Details:  Arthur [name] 12-06-1940 [id number]  Patient ID: Arthur [name], male   DOB: Mar 22, 1941, 71 y.o.   MRN: [id number] Viewed order from doctor's order list  Nikki Dom 08/03/2012, 5:09 PM

## 2012-08-03 NOTE — Anesthesia Postprocedure Evaluation (Signed)
Anesthesia Post Note  Patient: Arthur [name]  Procedure(s) Performed: Procedure(s) (LRB): TOTAL KNEE ARTHROPLASTY (Left)  Anesthesia type: general  Patient location: PACU  Post pain: Pain level controlled  Post assessment: Patient's Cardiovascular Status Stable  Last Vitals:  Filed Vitals:   08/03/12 1130  BP: 135/72  Pulse: 70  Temp:   Resp: 10    Post vital signs: Reviewed and stable  Level of consciousness: sedated  Complications: No apparent anesthesia complications

## 2012-08-03 NOTE — Brief Op Note (Signed)
08/03/2012  9:40 AM  PATIENT:  Arthur [name]  71 y.o. male  PRE-OPERATIVE DIAGNOSIS:  djd left knee   POST-OPERATIVE DIAGNOSIS:  djd left knee   PROCEDURE:  Procedure(s) (LRB) with comments: TOTAL KNEE ARTHROPLASTY (Left)  SURGEON:  Surgeon(s) and Role:    * Harvie Junior, MD - Primary  PHYSICIAN ASSISTANT:   ASSISTANTS: bethune   ANESTHESIA:   general  EBL:  Total I/O In: 1000 [I.V.:1000] Out: -   BLOOD ADMINISTERED:none  DRAINS: (1) Hemovact drain(s) in the l knee with  Suction Open   LOCAL MEDICATIONS USED:  NONE  SPECIMEN:  No Specimen  DISPOSITION OF SPECIMEN:  N/A  COUNTS:  YES  TOURNIQUET:   Total Tourniquet Time Documented: Thigh (Left) - 75 minutes  DICTATION: .Other Dictation: Dictation Number [id number]  PLAN OF CARE: Admit to inpatient   PATIENT DISPOSITION:  PACU - hemodynamically stable.   Delay start of Pharmacological VTE agent (>24hrs) due to surgical blood loss or risk of bleeding: no

## 2012-08-03 NOTE — Op Note (Signed)
NAMEAJITH, GABY NO.:  [id number]  MEDICAL RECORD NO.:  [id number]  LOCATION:  [id number]                        FACILITY:  [facility]  PHYSICIAN:  Harvie Junior, M.D.   DATE OF BIRTH:  Oct 24, 1940  DATE OF PROCEDURE:  08/03/2012 DATE OF DISCHARGE:                              OPERATIVE REPORT   PREOPERATIVE DIAGNOSIS:  End-stage degenerative joint disease, left knee.  POSTOPERATIVE DIAGNOSIS:  End-stage degenerative joint disease, left knee.  PRINCIPAL PROCEDURE:  Left total knee replacement with a Sigma system sign 5 femur, size 5 tibia, 12.5-mm bridging bearing, and a 38-mm all polyethylene patella.  SURGEON:  Harvie Junior, M.D.  ASSISTANT:  Marshia Ly, P.A.  ANESTHESIA:  General.  BRIEF HISTORY:  Mr. Looney is a 71 year old male with a long history of having significant bilateral degenerative joint disease of the knees. He has been treated conservatively for prolonged period of time because of failure of all conservative cares.  He has also been taken to the operating room for left total knee replacement.  He previously had a right total knee replacement and done well with that.  PROCEDURE:  The patient was taken to the operating room and after adequate level of anesthesia was obtained with general anesthetic, the patient was placed supine on the operating table.  The left leg was then prepped and draped in usual sterile fashion.  Following this, the leg was exsanguinated and blood pressure tourniquet was inflated to 350 mmHg.  Following this, a midline incision was made in the subcutaneous tissue to dissect down to the extensor mechanism and a medial parapatellar arthrotomy was undertaken.  Once this was done, the medial and lateral meniscus were removed, retropatellar fat pad, synovium in the anterior aspect of the femur, and once that was completed, the cuts were made, the tibia was cut perpendicular to its long axis of femur, cut perpendicular  with a 5-degree valgus cut and the spacer blocks were put in place, 12.5 spacer block gave easy full extension.  At this time, attention was turned to the femur, which was sized to a 5.  Anterior and posterior cuts were made, chamfers and box.  Attention was turned to the tibia where the tibia was sized to a 5 and rotated centrally.  He had a little bit of smaller lateral condyle and medial condyle, so we took care to try to rotate the component as much as we could without lateral or posterolateral overhang.  Once we got this into position, we then painted into place, put the keel into place, drilled it and then keeled it.  Once that was done, attention was turned towards tibial component. Trial femoral component was placed, 12.5 bridging bearing and a 38-mm all polyethylene patella.  The knee was then put through a range of motion and felt to be stable.  There was a little bit of click in the back and we went back and took a look at this.  There was a little bit of tissue around the popliteus, we resected that and clicking went away. At this point, the trial components were removed.  The final components were then cemented into place, size 5 femur,  size 5 tibia, 12.5-mm bridging bearing trial was placed, and 38-mm all polyethylene patella. Once this was completed, the cement was allowed to harden and once the cement was completely hardened, then the trial component was removed. Tourniquet was let down.  All bleeding was controlled with electrocautery.  Sterile compressive dressing was applied and the patient was then taken to recovery room where he was noted to be in satisfactory condition.  Final components were cemented into place.  All excess bone cement was removed.  Tourniquet was let down.  All bleedings were controlled with electrocautery, and then the medial parapatellar arthrotomy was closed with a #1 Vicryl running, skin with 0 and 2-0 Vicryl and then 3-0 Monocryl subcuticular.   Benzoin and Steri-Strips were applied.  Sterile compressive dressing was applied, and the patient was taken to the recovery room, he was noted to be in satisfactory condition.  Immediate Hemovac drain had been placed prior to closure. The patient was then taken to the recovery room, he was noted to be in satisfactory condition.  Estimated blood loss for this procedure was none.     Harvie Junior, M.D.     Ranae Plumber  D:  08/03/2012  T:  08/03/2012  Job:  [id number]

## 2012-08-03 NOTE — Evaluation (Signed)
Physical Therapy Evaluation Patient Details Name: Arthur [name] MRN: [id number] DOB: [birth year] Today's Date: 08/03/2012 Time: 4098-1191 PT Time Calculation (min): 29 min  PT Assessment / Plan / Recommendation Clinical Impression  Pt is a pleasent 71 y.o. male s/p L TKA. Pt tolerated treatment well. Pt has deficits in functional mobility secondary to weakness, decrease ROM, pain, and decreased activity tolerance.  Pt will benefit from continued skilled PT to address deficits in    PT Assessment  Patient needs continued PT services    Follow Up Recommendations  Home health PT          Equipment Recommendations  None recommended by PT    Recommendations for Other Services     Frequency 7X/week    Precautions / Restrictions     Pertinent Vitals/Pain 6/10      Mobility  Bed Mobility Bed Mobility: Rolling Left;Left Sidelying to Sit Rolling Left: 5: Supervision Left Sidelying to Sit: 5: Supervision Details for Bed Mobility Assistance: VC's for hand placement Transfers Transfers: Sit to Stand;Stand to Sit;Stand Pivot Transfers Sit to Stand: 4: Min guard;From bed;With upper extremity assist Stand to Sit: 4: Min guard;To chair/3-in-1 Stand Pivot Transfers: 4: Min guard Details for Transfer Assistance: Guard secondary to nerve block LLE unable to bear weight without buckling Ambulation/Gait Ambulation/Gait Assistance: Not tested (comment)       Exercises Total Joint Exercises Ankle Circles/Pumps: AROM;20 reps   PT Diagnosis: Difficulty walking;Abnormality of gait;Generalized weakness;Acute pain  PT Problem List: Decreased strength;Decreased range of motion;Decreased activity tolerance;Decreased balance;Decreased mobility;Pain PT Treatment Interventions: DME instruction;Gait training;Stair training;Functional mobility training;Therapeutic activities;Therapeutic exercise;Patient/family education   PT Goals Acute Rehab PT Goals PT Goal Formulation: With patient Time For  Goal Achievement: 08/08/12 Potential to Achieve Goals: Good Pt will Stand: with modified independence PT Goal: Stand - Progress: Goal set today Pt will Ambulate: >150 feet;with modified independence;with rolling walker PT Goal: Ambulate - Progress: Goal set today Pt will Go Up / Down Stairs: 3-5 stairs;with modified independence;with least restrictive assistive device PT Goal: Up/Down Stairs - Progress: Goal set today Pt will Perform Home Exercise Program: Independently PT Goal: Perform Home Exercise Program - Progress: Goal set today  Visit Information  Last PT Received On: 08/03/12 Assistance Needed: +1    Subjective Data  Subjective: Pain is not too bad   Prior Functioning  Home Living Lives With: Alone Available Help at Discharge: Family;Available PRN/intermittently Type of Home: House Home Access: Stairs to enter [employer] of Steps: 4 Entrance Stairs-Rails: Can reach both Home Layout: One level Bathroom Shower/Tub: Health visitor: Standard Bathroom Accessibility: No Home Adaptive Equipment: Bedside commode/3-in-1;Crutches;Walker - rolling;Shower chair with back;Straight cane Prior Function Level of Independence: Independent with assistive device(s) (cane) Able to Take Stairs?: Yes Driving: Yes 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 Communication Communication: HOH Dominant Hand: Right    Cognition  Overall Cognitive Status: Appears within functional limits for tasks assessed/performed Arousal/Alertness: Awake/alert Orientation Level: Appears intact for tasks assessed;Oriented X4 / Intact Behavior During Session: [facility] for tasks performed    Extremity/Trunk Assessment Right Upper Extremity Assessment RUE ROM/Strength/Tone: [facility] for tasks assessed Left Upper Extremity Assessment LUE ROM/Strength/Tone: WFL for tasks assessed Right Lower Extremity Assessment RLE ROM/Strength/Tone: [facility] for tasks assessed Left Lower  Extremity Assessment LLE ROM/Strength/Tone: Deficits;Unable to fully assess;Due to pain;Due to precautions   Balance    End of Session PT - End of Session Equipment Utilized During Treatment: Gait belt;Oxygen Activity Tolerance: Patient tolerated treatment well;Other (comment) (limited  due to no immobilizer at time of evaluation) Patient left: in chair;with call bell/phone within reach Nurse Communication: Mobility status  GP     Fabio Asa 08/03/2012, 4:08 PM  Charlotte Crumb, PT DPT  [phone]

## 2012-08-04 LAB — CBC
MCV: 88.6 fL (ref 78.0–100.0)
Platelets: 209 10*3/uL (ref 150–400)
RDW: 12.6 % (ref 11.5–15.5)
WBC: 14.3 10*3/uL — ABNORMAL HIGH (ref 4.0–10.5)

## 2012-08-04 LAB — BASIC METABOLIC PANEL
Calcium: 8.6 mg/dL (ref 8.4–10.5)
Chloride: 98 mEq/L (ref 96–112)
Creatinine, Ser: 0.91 mg/dL (ref 0.50–1.35)
GFR calc Af Amer: >90 mL/min (ref >90)

## 2012-08-04 LAB — PROTIME-INR: Prothrombin Time: 13.9 seconds (ref 11.6–15.2)

## 2012-08-04 MED ORDER — WARFARIN SODIUM 7.5 MG PO TABS
7.5000 mg | ORAL_TABLET | Freq: Once | ORAL | Status: DC
  Filled 2012-08-04: qty 1

## 2012-08-04 MED ORDER — WARFARIN SODIUM 7.5 MG PO TABS
7.5000 mg | ORAL_TABLET | Freq: Once | ORAL | Status: AC
  Administered 2012-08-04: 7.5 mg via ORAL
  Filled 2012-08-04: qty 1

## 2012-08-04 NOTE — Progress Notes (Signed)
[facility]  Patient Status: New  [facility] is providing the following services: RN and PT  Referral from MD office - thank you!  If patient discharges after hours, please call [phone].   Jodene Nam 08/04/2012, 9:16 AM

## 2012-08-04 NOTE — Progress Notes (Signed)
ANTICOAGULATION CONSULT NOTE - Follow Up Consult  Pharmacy Consult for warfarin Indication: VTE prophylaxis  Allergies  Allergen Reactions  . Doxycycline     Esophagitis severe gi bleed    Patient Measurements: Height: 6' (182.9 cm) Weight: 195 lb 5.2 oz (88.6 kg) IBW/kg (Calculated) : 77.6   Vital Signs: Temp: 97.3 F (36.3 C) (11/02 1100) Temp src: Oral (11/02 1100) BP: 113/58 mmHg (11/02 1100) Pulse Rate: 68  (11/02 1100)  Labs:  Basename 08/04/12 0510 08/02/12 0823  HGB 10.3* 14.4  HCT 32.0* 43.2  PLT 209 245  APTT -- 30  LABPROT 13.9 13.3  INR 1.08 1.02  HEPARINUNFRC -- --  CREATININE 0.91 0.88  CKTOTAL -- --  CKMB -- --  TROPONINI -- --    Estimated Creatinine Clearance: 82.9 ml/min (by C-G formula based on Cr of 0.91).   Assessment: 71 YOM s/p left TKA on warfarin for VTE prophylaxis. Baseline INR was 1.02 and received one dose of 7.5mg . Today's INR is 1.08. Hgb and platelets both dropped, but suspect this is mainly due to surgery. No bleeding noted. Planned discharge 11/3.  Goal of Therapy:  INR 2-3 Monitor platelets by anticoagulation protocol: Yes   Plan:  1. Repeat warfarin 7.5mg  x1 tonight 2. Daily CBC/INR 3. Will continue to follow Hgb and platelets to ensure there is no active bleeding  Haiven Nardone D. Keyatta Tolles, PharmD Clinical Pharmacist Pager: [phone] Phone: [phone] 08/04/2012 11:46 AM

## 2012-08-04 NOTE — Progress Notes (Signed)
Physical Therapy Treatment Patient Details Name: Arthur [name] MRN: [id number] DOB: 21-Apr-1941 Today's Date: 08/04/2012 Time: 7829-5621 PT Time Calculation (min): 24 min  PT Assessment / Plan / Recommendation Comments on Treatment Session  Excellent progress noted.  Pt very motivated to participate in PT.    Follow Up Recommendations  Home health PT     Does the patient have the potential to tolerate intense rehabilitation     Barriers to Discharge        Equipment Recommendations  None recommended by PT    Recommendations for Other Services    Frequency 7X/week   Plan Discharge plan remains appropriate;Frequency remains appropriate    Precautions / Restrictions Precautions Precautions: Knee Required Braces or Orthoses: Knee Immobilizer - Left Knee Immobilizer - Left: On when out of bed or walking Restrictions LLE Weight Bearing: Weight bearing as tolerated   Pertinent Vitals/Pain     Mobility  Bed Mobility Bed Mobility: Supine to Sit Supine to Sit: 5: Supervision Transfers Sit to Stand: 4: Min guard;With upper extremity assist Stand to Sit: 5: Supervision;To chair/3-in-1;With armrests Details for Transfer Assistance: verbal cues for hand placement Ambulation/Gait Ambulation/Gait Assistance: 4: Min guard Ambulation Distance (Feet): 120 Feet Assistive device: Rolling walker Ambulation/Gait Assistance Details: verbal cues for RW management Gait Pattern: Step-to pattern;Antalgic    Exercises Total Joint Exercises Ankle Circles/Pumps: AROM;Both;10 reps Quad Sets: AROM;Left;10 reps Heel Slides: AROM;Left;10 reps Hip ABduction/ADduction: AROM;Left;10 reps Goniometric ROM: 0-90 L knee AROM   PT Diagnosis:    PT Problem List:   PT Treatment Interventions:     PT Goals Acute Rehab PT Goals PT Goal: Stand - Progress: Progressing toward goal PT Goal: Ambulate - Progress: Progressing toward goal PT Goal: Up/Down Stairs - Progress: Progressing toward goal PT  Goal: Perform Home Exercise Program - Progress: Progressing toward goal  Visit Information  Last PT Received On: 08/04/12 Assistance Needed: +1    Subjective Data  Subjective: "I am good." Patient Stated Goal: home tomorrow   Cognition  Overall Cognitive Status: Appears within functional limits for tasks assessed/performed Arousal/Alertness: Awake/alert Orientation Level: Oriented X4 / Intact Behavior During Session: [facility] for tasks performed    Balance     End of Session PT - End of Session Equipment Utilized During Treatment: Gait belt;Left knee immobilizer Activity Tolerance: Patient tolerated treatment well Patient left: in chair;with call bell/phone within reach Nurse Communication: Mobility status   GP     Ilda Foil 08/04/2012, 1:06 PM  Aida Raider, PT  Office # [phone] Pager [phone]

## 2012-08-04 NOTE — Progress Notes (Signed)
Physical Therapy Treatment Patient Details Name: Arthur [name] MRN: [id number] DOB: Apr 20, 1941 Today's Date: 08/04/2012 Time: 4098-1191 PT Time Calculation (min): 24 min  PT Assessment / Plan / Recommendation Comments on Treatment Session  Pt tolerated Rx well despited increased pain this PM.  Stair training completed.  Plan is for d/c home tomorrow.    Follow Up Recommendations  Home health PT     Does the patient have the potential to tolerate intense rehabilitation     Barriers to Discharge        Equipment Recommendations  None recommended by PT    Recommendations for Other Services    Frequency 7X/week   Plan Discharge plan remains appropriate;Frequency remains appropriate    Precautions / Restrictions Precautions Precautions: Knee Required Braces or Orthoses: Knee Immobilizer - Left Knee Immobilizer - Left: On when out of bed or walking Restrictions LLE Weight Bearing: Weight bearing as tolerated   Pertinent Vitals/Pain     Mobility  Bed Mobility Bed Mobility: Sit to Supine Supine to Sit: 5: Supervision Sit to Supine: 4: Min assist Details for Bed Mobility Assistance: assist with LLE during sit to supine Transfers Sit to Stand: 5: Supervision;From bed;With upper extremity assist Stand to Sit: 5: Supervision;With upper extremity assist;To bed Details for Transfer Assistance: verbal cues for sequencing Ambulation/Gait Ambulation/Gait Assistance: 4: Min guard Ambulation Distance (Feet): 120 Feet Assistive device: Rolling walker Ambulation/Gait Assistance Details: verbal cues for RW management Gait Pattern: Step-through pattern;Antalgic Stairs: Yes Stairs Assistance: 4: Min guard Stair Management Technique: Two rails;Forwards Number of Stairs: 3     Exercises Total Joint Exercises Ankle Circles/Pumps: AROM;Both;10 reps;Supine Quad Sets: AROM;Left;10 reps;Supine Heel Slides: AROM;Left;10 reps;Supine Hip ABduction/ADduction: AROM;Left;10  reps;Supine Goniometric ROM: 0-90 L knee AROM   PT Diagnosis:    PT Problem List:   PT Treatment Interventions:     PT Goals Acute Rehab PT Goals PT Goal: Stand - Progress: Progressing toward goal PT Goal: Ambulate - Progress: Progressing toward goal PT Goal: Up/Down Stairs - Progress: Progressing toward goal PT Goal: Perform Home Exercise Program - Progress: Progressing toward goal  Visit Information  Last PT Received On: 08/04/12 Assistance Needed: +1    Subjective Data  Subjective: "I am hurting this afternoon.  I think I stayed in the chair too long." Patient Stated Goal: home tomorrow   Cognition  Overall Cognitive Status: Appears within functional limits for tasks assessed/performed Arousal/Alertness: Awake/alert Orientation Level: Appears intact for tasks assessed Behavior During Session: [facility] for tasks performed    Balance     End of Session PT - End of Session Equipment Utilized During Treatment: Gait belt;Left knee immobilizer Activity Tolerance: Patient tolerated treatment well Patient left: in bed;in CPM;with call bell/phone within reach;with family/visitor present Nurse Communication: Mobility status CPM Left Knee CPM Left Knee: On Left Knee Flexion (Degrees): 60  Left Knee Extension (Degrees): 0    GP     Ilda Foil 08/04/2012, 3:42 PM  Aida Raider, PT  Office # [phone] Pager [phone]

## 2012-08-04 NOTE — Progress Notes (Signed)
08/04/12 1200  Clinical Encounter Type  Visited With Patient  Visit Type Initial   I visited with patient for a while after his knee surgery.  Veryl Speak

## 2012-08-04 NOTE — Progress Notes (Signed)
Subjective: 1 Day Post-Op Procedure(s) (LRB): TOTAL KNEE ARTHROPLASTY (Left)  Activity level:  Has been out of bed weightbearing as tolerated right knee Diet tolerance:  Eating Voiding:  Catheter out Patient reports pain as 4 on 0-10 scale.    Objective: Vital signs in last 24 hours: Temp:  [97 F (36.1 C)-98.4 F (36.9 C)] 98.4 F (36.9 C) (11/02 0640) Pulse Rate:  [70-92] 72  (11/02 0640) Resp:  [10-26] 16  (11/02 0640) BP: (104-143)/(52-80) 122/57 mmHg (11/02 0640) SpO2:  [93 %-99 %] 99 % (11/02 0640)  Labs:  Basename 08/04/12 0510 08/02/12 0823  HGB 10.3* 14.4    Basename 08/04/12 0510 08/02/12 0823  WBC 14.3* 9.9  RBC 3.61* 4.79  HCT 32.0* 43.2  PLT 209 245    Basename 08/04/12 0510 08/02/12 0823  NA 134* 140  K 3.9 4.3  CL 98 101  CO2 26 32  BUN 20 20  CREATININE 0.91 0.88  GLUCOSE 140* 99  CALCIUM 8.6 10.0    Basename 08/04/12 0510 08/02/12 0823  LABPT -- --  INR 1.08 1.02    Physical Exam:  Neurologically intact ABD soft Neurovascular intact Sensation intact distally Intact pulses distally Dorsiflexion/Plantar flexion intact Incision: dressing C/D/I No cellulitis present Compartment soft Dressing dry but drain is pulled today.  Assessment/Plan:  1 Day Post-Op Procedure(s) (LRB): TOTAL KNEE ARTHROPLASTY (Left) Advance diet Up with therapy D/C IV fluids Plan for discharge tomorrow Saline lock his IV today    Arthur [name] R 08/04/2012, 8:15 AM

## 2012-08-05 LAB — CBC
HCT: 31.5 % — ABNORMAL LOW (ref 39.0–52.0)
MCV: 90 fL (ref 78.0–100.0)
RBC: 3.5 MIL/uL — ABNORMAL LOW (ref 4.22–5.81)
RDW: 12.7 % (ref 11.5–15.5)

## 2012-08-05 LAB — PROTIME-INR: Prothrombin Time: 25.4 seconds — ABNORMAL HIGH (ref 11.6–15.2)

## 2012-08-05 MED ORDER — INFLUENZA VIRUS VACC SPLIT PF IM SUSP
0.5000 mL | INTRAMUSCULAR | Status: AC
  Administered 2012-08-06: 0.5 mL via INTRAMUSCULAR
  Filled 2012-08-05: qty 0.5

## 2012-08-05 MED ORDER — METHOCARBAMOL 500 MG PO TABS: 500.0000 mg | ORAL_TABLET | Freq: Four times a day (QID) | ORAL | Status: DC | PRN

## 2012-08-05 MED ORDER — OXYCODONE HCL 5 MG PO TABS: 5.0000 mg | ORAL_TABLET | ORAL | Status: DC | PRN

## 2012-08-05 NOTE — Progress Notes (Signed)
Subjective: 2 Days Post-Op Procedure(s) (LRB): TOTAL KNEE ARTHROPLASTY (Left) C/o of increased knee pain now that the block has worn off. Would like to go home Monday  Activity level:  oob with pt Diet tolerance:  ok Voiding:  ok Patient reports pain as 8 on 0-10 scale.    Objective: Vital signs in last 24 hours: Temp:  [98.5 F (36.9 C)-100.7 F (38.2 C)] 99.8 F (37.7 C) (11/03 0655) Pulse Rate:  [66-86] 86  (11/03 0655) Resp:  [18-20] 18  (11/03 0655) BP: (115-157)/(61-67) 119/61 mmHg (11/03 0655) SpO2:  [98 %] 98 % (11/03 0655)  Labs:  Basename 08/05/12 0614 08/04/12 0510  HGB 10.1* 10.3*    Basename 08/05/12 0614 08/04/12 0510  WBC 12.0* 14.3*  RBC 3.50* 3.61*  HCT 31.5* 32.0*  PLT 205 209    Basename 08/04/12 0510  NA 134*  K 3.9  CL 98  CO2 26  BUN 20  CREATININE 0.91  GLUCOSE 140*  CALCIUM 8.6    Basename 08/05/12 0614 08/04/12 0510  LABPT -- --  INR 2.44* 1.08    Physical Exam:  Neurologically intact ABD soft Neurovascular intact Sensation intact distally Intact pulses distally Dorsiflexion/Plantar flexion intact Incision: dressing C/D/I No cellulitis present Compartment soft Dressing changed  Assessment/Plan:  2 Days Post-Op Procedure(s) (LRB): TOTAL KNEE ARTHROPLASTY (Left) Advance diet Up with therapy D/C IV fluids Plan for discharge tomorrow due to increased pain today Will cont with pt  cont coumadin x 4 weeks    Arthur [name] R 08/05/2012, 11:02 AM

## 2012-08-05 NOTE — Progress Notes (Signed)
ANTICOAGULATION CONSULT NOTE - Follow Up Consult  Pharmacy Consult for warfarin Indication: VTE prophylaxis  Allergies  Allergen Reactions  . Doxycycline     Esophagitis severe gi bleed    Patient Measurements: Height: 6' (182.9 cm) Weight: 195 lb 5.2 oz (88.6 kg) IBW/kg (Calculated) : 77.6   Vital Signs: Temp: 99.8 F (37.7 C) (11/03 0655) Temp src: Oral (11/03 0655) BP: 119/61 mmHg (11/03 0655) Pulse Rate: 86  (11/03 0655)  Labs:  Basename 08/05/12 0614 08/04/12 0510  HGB 10.1* 10.3*  HCT 31.5* 32.0*  PLT 205 209  APTT -- --  LABPROT 25.4* 13.9  INR 2.44* 1.08  HEPARINUNFRC -- --  CREATININE -- 0.91  CKTOTAL -- --  CKMB -- --  TROPONINI -- --    Estimated Creatinine Clearance: 82.9 ml/min (by C-G formula based on Cr of 0.91).   Medications:  Scheduled:    . docusate sodium  100 mg Oral BID  . ferrous sulfate  325 mg Oral BID WC  . influenza  inactive virus vaccine  0.5 mL Intramuscular Tomorrow-1000  . levothyroxine  200 mcg Oral QAC breakfast  . pantoprazole  40 mg Oral Daily  . [COMPLETED] warfarin  7.5 mg Oral ONCE-1800  . Warfarin - Pharmacist Dosing Inpatient   Does not apply q1800  . [COMPLETED] warfarin  7.5 mg Oral ONCE-1800    Assessment: 89 YOM admitted 11/1 for a total knee replacement. Warfarin being given for VTE prophylaxis. Baseline INR was 1.02, yesterday it was 1.08. He received 7.5mg  on 11/1 and 11/2. This morning's INR was 2.44, which is therapeutic and likely from first dose of 7.5mg  due to onset of action of ~2 days for warfarin. CBC is starting to increase. No bleeding noted.   Goal of Therapy:  INR 2-3 Monitor platelets by anticoagulation protocol: Yes   Plan:  1. Hold warfarin dose tonight as INR is likely to increase again overnight 2. Daily CBC/INR 3. Follow CBC and platelets for continued increases  Raeden Schippers D. Karthik Whittinghill, PharmD Clinical Pharmacist Pager: [phone] Phone: [phone] 08/05/2012 1:43 PM

## 2012-08-05 NOTE — Progress Notes (Signed)
Physical Therapy Treatment Patient Details Name: HOLLAND MIHALIK MRN: [id number] DOB: [birth year] Today's Date: 08/05/2012 Time: 4098-1191 PT Time Calculation (min): 23 min  PT Assessment / Plan / Recommendation Comments on Treatment Session  Pt not feeling well this PM.  Reports he spiked a high temp last night and started vomitting this PM.    Follow Up Recommendations  Home health PT     Does the patient have the potential to tolerate intense rehabilitation     Barriers to Discharge        Equipment Recommendations  None recommended by PT    Recommendations for Other Services    Frequency 7X/week   Plan Discharge plan remains appropriate;Frequency remains appropriate    Precautions / Restrictions Precautions Precautions: Knee Required Braces or Orthoses: Knee Immobilizer - Left Knee Immobilizer - Left: On when out of bed or walking Restrictions LLE Weight Bearing: Weight bearing as tolerated   Pertinent Vitals/Pain     Mobility  Bed Mobility Supine to Sit: 6: Modified independent (Device/Increase time);HOB flat Sit to Supine: 4: Min guard Transfers Sit to Stand: 5: Supervision Stand to Sit: 5: Supervision Details for Transfer Assistance: verbal cues for sequencing Ambulation/Gait Ambulation/Gait Assistance: 5: Supervision Ambulation Distance (Feet): 250 Feet Assistive device: Rolling walker Ambulation/Gait Assistance Details: ambulated without knee immobilizer.  No buckling noted.  Pt having difficultly with heel strike/toe off on L. Gait Pattern: Step-to pattern;Antalgic Gait velocity: decreased    Exercises Total Joint Exercises Ankle Circles/Pumps: AROM;Both;10 reps;Supine Quad Sets: AROM;Left;10 reps;Supine Heel Slides: AAROM;Left;10 reps;Supine Hip ABduction/ADduction: AROM;Left;10 reps;Supine Goniometric ROM: 0-75 AAROM L knee   PT Diagnosis:    PT Problem List:   PT Treatment Interventions:     PT Goals Acute Rehab PT Goals PT Goal: Stand -  Progress: Progressing toward goal PT Goal: Ambulate - Progress: Progressing toward goal PT Goal: Up/Down Stairs - Progress: Progressing toward goal PT Goal: Perform Home Exercise Program - Progress: Progressing toward goal  Visit Information  Last PT Received On: 08/05/12 Assistance Needed: +1    Subjective Data  Subjective: "I'm not feeling well. I just vomitted." Patient Stated Goal: feel better   Cognition  Overall Cognitive Status: Appears within functional limits for tasks assessed/performed Arousal/Alertness: Awake/alert Orientation Level: Appears intact for tasks assessed Behavior During Session: [facility] for tasks performed    Balance     End of Session PT - End of Session Equipment Utilized During Treatment: Gait belt Activity Tolerance: Treatment limited secondary to medical complications (Comment) (N and V) Patient left: in chair;with call bell/phone within reach Nurse Communication: Mobility status   GP     Ilda Foil 08/05/2012, 1:36 PM  Aida Raider, PT  Office # [phone] Pager [phone]

## 2012-08-05 NOTE — Progress Notes (Signed)
Physical Therapy Treatment Patient Details Name: Arthur [name] MRN: [id number] DOB: 1941/02/21 Today's Date: 08/05/2012 Time: 1324-4010 PT Time Calculation (min): 33 min  PT Assessment / Plan / Recommendation Comments on Treatment Session  Probable d/c home today.  Pt mobility level appropriate for d/c.    Follow Up Recommendations  Home health PT     Does the patient have the potential to tolerate intense rehabilitation     Barriers to Discharge        Equipment Recommendations  None recommended by PT    Recommendations for Other Services    Frequency 7X/week   Plan Discharge plan remains appropriate    Precautions / Restrictions Precautions Precautions: Knee Required Braces or Orthoses: Knee Immobilizer - Left Knee Immobilizer - Left: On when out of bed or walking Restrictions LLE Weight Bearing: Weight bearing as tolerated   Pertinent Vitals/Pain 6/10    Mobility  Bed Mobility Supine to Sit: 6: Modified independent (Device/Increase time);HOB flat Transfers Sit to Stand: 5: Supervision;With upper extremity assist;From bed Stand to Sit: 6: Modified independent (Device/Increase time);With armrests;To chair/3-in-1 Details for Transfer Assistance: verbal cues for sequencing Ambulation/Gait Ambulation/Gait Assistance: 5: Supervision Ambulation Distance (Feet): 350 Feet Assistive device: Rolling walker Ambulation/Gait Assistance Details: verbal cues for RW management Gait Pattern: Step-to pattern;Antalgic Gait velocity: decreased    Exercises Total Joint Exercises Ankle Circles/Pumps: AROM;Both;10 reps;Supine Quad Sets: AROM;Left;10 reps;Supine Heel Slides: AAROM;Left;10 reps;Supine Hip ABduction/ADduction: AROM;Left;10 reps;Supine Goniometric ROM: 0-75 AAROM L knee   PT Diagnosis:    PT Problem List:   PT Treatment Interventions:     PT Goals Acute Rehab PT Goals PT Goal: Stand - Progress: Progressing toward goal PT Goal: Ambulate - Progress: Progressing  toward goal PT Goal: Up/Down Stairs - Progress: Progressing toward goal PT Goal: Perform Home Exercise Program - Progress: Progressing toward goal  Visit Information  Last PT Received On: 08/05/12 Assistance Needed: +1    Subjective Data  Subjective: "No one warned me about the nerve block wearing off." Patient Stated Goal: home   Cognition  Overall Cognitive Status: Appears within functional limits for tasks assessed/performed Arousal/Alertness: Awake/alert Orientation Level: Oriented X4 / Intact Behavior During Session: [facility] for tasks performed    Balance     End of Session PT - End of Session Equipment Utilized During Treatment: Gait belt;Left knee immobilizer Activity Tolerance: Patient tolerated treatment well Patient left: in chair;with call bell/phone within reach Nurse Communication: Mobility status   GP     Ilda Foil 08/05/2012, 10:25 AM  Aida Raider, PT  Office # [phone] Pager [phone]

## 2012-08-06 ENCOUNTER — Encounter (HOSPITAL_COMMUNITY): Payer: Self-pay | Admitting: Orthopedic Surgery

## 2012-08-06 LAB — CBC
Hemoglobin: 10.9 g/dL — ABNORMAL LOW (ref 13.0–17.0)
MCH: 29 pg (ref 26.0–34.0)
Platelets: 244 10*3/uL (ref 150–400)
RBC: 3.76 MIL/uL — ABNORMAL LOW (ref 4.22–5.81)
WBC: 10.2 10*3/uL (ref 4.0–10.5)

## 2012-08-06 LAB — PROTIME-INR: INR: 2.03 — ABNORMAL HIGH (ref 0.00–1.49)

## 2012-08-06 MED ORDER — WARFARIN SODIUM 5 MG PO TABS: 5.0000 mg | ORAL_TABLET | Freq: Every day | ORAL | Status: DC

## 2012-08-06 MED ORDER — OXYCODONE-ACETAMINOPHEN 5-325 MG PO TABS
1.0000 | ORAL_TABLET | ORAL | Status: DC | PRN
Start: 2012-08-06 — End: 2014-09-23

## 2012-08-06 MED ORDER — METHOCARBAMOL 500 MG PO TABS: 500.0000 mg | ORAL_TABLET | Freq: Four times a day (QID) | ORAL | Status: DC

## 2012-08-06 NOTE — Progress Notes (Signed)
Physical Therapy Treatment Patient Details Name: Arthur [name] MRN: [id number] DOB: 06/07/1941 Today's Date: 08/06/2012 Time: 0900-0930 PT Time Calculation (min): 30 min  PT Assessment / Plan / Recommendation Comments on Treatment Session  Pt admitted s/p left TKA and continues to progress with PT. Pt very motivated and able to tolerate increased ambulation distance as well as stair negotiation this am.    Follow Up Recommendations  Home health PT     Does the patient have the potential to tolerate intense rehabilitation     Barriers to Discharge        Equipment Recommendations  None recommended by PT    Recommendations for Other Services    Frequency 7X/week   Plan Discharge plan remains appropriate;Frequency remains appropriate    Precautions / Restrictions Precautions Precautions: Knee Precaution Booklet Issued: No Required Braces or Orthoses: Knee Immobilizer - Left Knee Immobilizer - Left: On when out of bed or walking Restrictions Weight Bearing Restrictions: Yes LLE Weight Bearing: Weight bearing as tolerated   Pertinent Vitals/Pain 5/10 in left knee. Pt repositioned and RN aware.    Mobility  Bed Mobility Bed Mobility: Supine to Sit Supine to Sit: 4: Min assist;HOB flat Details for Bed Mobility Assistance: Assist for left LE initially due to pain. Cues for sequence. Transfers Transfers: Sit to Stand;Stand to Sit Sit to Stand: 5: Supervision;With upper extremity assist;From bed Stand to Sit: 5: Supervision;With upper extremity assist;To chair/3-in-1 Details for Transfer Assistance: Verbal cues for safest hand/left LE placement. Ambulation/Gait Ambulation/Gait Assistance: 5: Supervision Ambulation Distance (Feet): 300 Feet Assistive device: Rolling walker Ambulation/Gait Assistance Details: Verbal cues for tall posture and safety inside RW. Cues for attempted step-through sequence. Gait Pattern: Step-to pattern;Step-through pattern;Antalgic Stairs:  Yes Stairs Assistance: 4: Min assist Stairs Assistance Details (indicate cue type and reason): Up to min assist for backwards with RW in order to steady RW. Pt able to recall "up with good, down with bad." Practiced backward with RW step-to first and then practiced backward with bilateral rails step-to. Stair Management Technique: Two rails;Step to pattern;Backwards;With walker (1. With RW backward. 2. 2 rails backward.) Number of Stairs: 4  (2 steps x 2 trials.) Wheelchair Mobility Wheelchair Mobility: No    Exercises Total Joint Exercises Ankle Circles/Pumps: AROM;Left;10 reps;Supine Quad Sets: AROM;Left;10 reps;Supine Heel Slides: AAROM;Left;10 reps;Supine Hip ABduction/ADduction: AAROM;Left;10 reps;Supine Straight Leg Raises: AAROM;Left;10 reps;Supine Goniometric ROM: AA/ROM left knee 0-80 degrees.   PT Diagnosis:    PT Problem List:   PT Treatment Interventions:     PT Goals Acute Rehab PT Goals PT Goal Formulation: With patient Time For Goal Achievement: 08/08/12 Potential to Achieve Goals: Good PT Goal: Stand - Progress: Progressing toward goal PT Goal: Ambulate - Progress: Progressing toward goal PT Goal: Up/Down Stairs - Progress: Progressing toward goal PT Goal: Perform Home Exercise Program - Progress: Progressing toward goal  Visit Information  Last PT Received On: 08/06/12 Assistance Needed: +1    Subjective Data  Subjective: "I get to go home today I hope." Patient Stated Goal: feel better   Cognition  Overall Cognitive Status: Appears within functional limits for tasks assessed/performed Arousal/Alertness: Awake/alert Orientation Level: Appears intact for tasks assessed Behavior During Session: [facility] for tasks performed    Balance  Balance Balance Assessed: No  End of Session PT - End of Session Equipment Utilized During Treatment: Gait belt;Left knee immobilizer Activity Tolerance: Patient tolerated treatment well Patient left: in chair;with call  bell/phone within reach Nurse Communication: Mobility status   GP  Arthur [name] 08/06/2012, 9:54 AM  08/06/2012 Arthur [name], PT, DPT [phone]

## 2012-08-06 NOTE — Progress Notes (Signed)
Subjective: 3 Days Post-Op Procedure(s) (LRB): TOTAL KNEE ARTHROPLASTY (Left) Patient reports pain as 5 on 0-10 scale.    Objective: Vital signs in last 24 hours: Temp:  [98.3 F (36.8 C)-99.3 F (37.4 C)] 98.3 F (36.8 C) (11/04 0500) Pulse Rate:  [67-86] 67  (11/04 0500) Resp:  [18] 18  (11/04 0500) BP: (126-138)/(56-60) 127/58 mmHg (11/04 0500) SpO2:  [94 %-98 %] 98 % (11/04 0500)  Intake/Output from previous day: 11/03 0701 - 11/04 0700 In: 1120 [P.O.:1120] Out: 1550 [Urine:1550] Intake/Output this shift:     Basename 08/05/12 0614 08/04/12 0510  HGB 10.1* 10.3*    Basename 08/05/12 0614 08/04/12 0510  WBC 12.0* 14.3*  RBC 3.50* 3.61*  HCT 31.5* 32.0*  PLT 205 209    Basename 08/04/12 0510  NA 134*  K 3.9  CL 98  CO2 26  BUN 20  CREATININE 0.91  GLUCOSE 140*  CALCIUM 8.6    Basename 08/05/12 0614 08/04/12 0510  LABPT -- --  INR 2.44* 1.08    Neurologically intact ABD soft Neurovascular intact Sensation intact distally Intact pulses distally No cellulitis present Compartment soft  Assessment/Plan: 3 Days Post-Op Procedure(s) (LRB): TOTAL KNEE ARTHROPLASTY (Left) Advance diet Up with therapy Discharge home with home health  Teresita Fanton L 08/06/2012, 7:53 AM

## 2012-08-06 NOTE — Discharge Summary (Signed)
Patient ID: Arthur [name] MRN: [id number] DOB/AGE: 06/05/1941 71 y.o.  Admit date: 08/03/2012 Discharge date: 08/06/2012  Admission Diagnoses:  Principal Problem:  *Osteoarthritis of left knee   Discharge Diagnoses:  Same  Past Medical History  Diagnosis Date  . LBBB (left bundle branch block) 10/2007  . GERD (gastroesophageal reflux disease)   . Migraines   . COPD (chronic obstructive pulmonary disease)   . Thyroid cancer   . Tobacco use disorder   . ED (erectile dysfunction)   . Unspecified hypothyroidism   . Hypercholesterolemia   . Nonischemic cardiomyopathy   . Arthritis   . Prostate cancer   . Reflux esophagitis   . Essential hypertension, benign     chris guess  pcp    Surgeries: Procedure(s):Left TOTAL KNEE ARTHROPLASTY on 08/03/2012  Discharged Condition: Improved  Hospital Course: DEONTRAY HUNNICUTT is an 71 y.o. male who was admitted 08/03/2012 for operative treatment ofOsteoarthritis of left knee. Patient has severe unremitting pain that affects sleep, daily activities, and work/hobbies. After pre-op clearance the patient was taken to the operating room on 08/03/2012 and underwent  Procedure(s):Left TOTAL KNEE ARTHROPLASTY.    Patient was given perioperative antibiotics: Anti-infectives     Start     Dose/Rate Route Frequency Ordered Stop   08/03/12 1350   ceFAZolin (ANCEF) IVPB 2 g/50 mL premix        2 g 100 mL/hr over 30 Minutes Intravenous Every 6 hours 08/03/12 1247 08/03/12 2029   08/03/12 0739   cefUROXime (ZINACEF) injection  Status:  Discontinued          As needed 08/03/12 0740 08/03/12 1008   08/02/12 1423   ceFAZolin (ANCEF) 3 g in dextrose 5 % 50 mL IVPB        3 g 160 mL/hr over 30 Minutes Intravenous 60 min pre-op 08/02/12 1423 08/03/12 0750           Patient was given sequential compression devices, early ambulation, and chemoprophylaxis to prevent DVT.  Patient benefited maximally from hospital stay and there were no complications.      Recent vital signs: Patient Vitals for the past 24 hrs:  BP Temp Temp src Pulse Resp SpO2  08/06/12 0500 127/58 mmHg 98.3 F (36.8 C) Oral 67  18  98 %  August 12, 2012 2134 138/60 mmHg 99.3 F (37.4 C) Oral 86  18  94 %     Recent laboratory studies:  Basename 08/06/12 0752 2012/08/12 0614 08/04/12 0510  WBC 10.2 12.0* --  HGB 10.9* 10.1* --  HCT 33.7* 31.5* --  PLT 244 205 --  NA -- -- 134*  K -- -- 3.9  CL -- -- 98  CO2 -- -- 26  BUN -- -- 20  CREATININE -- -- 0.91  GLUCOSE -- -- 140*  INR 2.03* 2.44* --  CALCIUM -- -- 8.6     Discharge Medications:     Medication List     As of 08/06/2012  3:37 PM    STOP taking these medications         traMADol 50 MG tablet   Commonly known as: ULTRAM      TAKE these medications         levothyroxine 200 MCG tablet   Commonly known as: SYNTHROID, LEVOTHROID   Take 1 tablet (200 mcg total) by mouth daily.      methocarbamol 500 MG tablet   Commonly known as: ROBAXIN   Take 1 tablet (500 mg total) by mouth  every 6 (six) hours as needed.      methocarbamol 500 MG tablet   Commonly known as: ROBAXIN   Take 1 tablet (500 mg total) by mouth 4 (four) times daily.      oxyCODONE 5 MG immediate release tablet   Commonly known as: Oxy IR/ROXICODONE   Take 1-2 tablets (5-10 mg total) by mouth every 3 (three) hours as needed.      oxyCODONE-acetaminophen 5-325 MG per tablet   Commonly known as: PERCOCET/ROXICET   Take 1-2 tablets by mouth every 4 (four) hours as needed for pain.      pantoprazole 40 MG tablet   Commonly known as: PROTONIX   Take 1 tablet (40 mg total) by mouth daily. 30 minutes before breakfast      rizatriptan 10 MG tablet   Commonly known as: MAXALT   Take 1 tablet (10 mg total) by mouth as needed. May repeat in 2 hours if needed      tadalafil 5 MG tablet   Commonly known as: CIALIS   Take 5 mg by mouth daily as needed. For erectile dysfunction      warfarin 5 MG tablet   Commonly known as: COUMADIN    Take 1 tablet (5 mg total) by mouth daily. Adjust dose per home health pharmacy        Disposition: 01-Home or Self Care      Discharge Orders    Future Appointments: Provider: Department: Dept Phone: Center:   08/20/2012 8:30 AM Jonita Albee, MD [facility] [phone] [facility]     Future Orders Please Complete By Expires   Diet - low sodium heart healthy      Diet general      Call MD / Call 911      Comments:   If you experience chest pain or shortness of breath, CALL 911 and be transported to the hospital emergency room.  If you develope a fever above 101 F, pus (white drainage) or increased drainage or redness at the wound, or calf pain, call your surgeon's office.   Constipation Prevention      Comments:   Drink plenty of fluids.  Prune juice may be helpful.  You may use a stool softener, such as Colace (over the counter) 100 mg twice a day.  Use MiraLax (over the counter) for constipation as needed.   Increase activity slowly as tolerated      Call MD / Call 911      Comments:   If you experience chest pain or shortness of breath, CALL 911 and be transported to the hospital emergency room.  If you develope a fever above 101 F, pus (white drainage) or increased drainage or redness at the wound, or calf pain, call your surgeon's office.   Constipation Prevention      Comments:   Drink plenty of fluids.  Prune juice may be helpful.  You may use a stool softener, such as Colace (over the counter) 100 mg twice a day.  Use MiraLax (over the counter) for constipation as needed.   Increase activity slowly as tolerated      Weight bearing as tolerated      CPM      Comments:   Continuous passive motion machine (CPM):      Use the CPM from 0 degrees to 70 degrees for 8 hours per day.      You may increase by 5 degrees per day.  You may break  it up into 2 or 3 sessions per day.      Use CPM for 1-2 weeks or until you are told to stop.   Do not put a pillow under the  knee. Place it under the heel.         Follow-up Information    Follow up with GRAVES,JOHN L, MD. Schedule an appointment as soon as possible for a visit in 2 weeks.   Contact information:   [address] [place] [address] [phone]           Signed: GRYFFIN, ALTICE 08/06/2012, 3:37 PM

## 2012-08-06 NOTE — Progress Notes (Signed)
CARE MANAGEMENT NOTE 08/06/2012  Patient:  Arthur [name], Arthur [name]   Account Number:  [id number]  Date Initiated:  08/06/2012  Documentation initiated by:  Vance Peper  Subjective/Objective Assessment:   71 yr old male s/p left total knee arthroplasty.     Action/Plan:   CM spoke with patient concerning home health and DME needs.Choice offered. Preoperatively setup with [facility], no changes. Has rolling walker and 3in1, CPM to be delivered.   Anticipated DC Date:  08/06/2012   Anticipated DC Plan:  HOME W HOME HEALTH SERVICES      DC Planning Services  CM consult      [facility] Choice  HOME HEALTH   Choice offered to / List presented to:  C-1 Patient        HH arranged  HH-1 RN  HH-2 PT      [facility] agency  [facility]   Status of service:  Completed, signed off Medicare Important Message given?   (If response is "NO", the following Medicare IM given date fields will be blank) Date Medicare IM given:   Date Additional Medicare IM given:    Discharge Disposition:  HOME W HOME HEALTH SERVICES  Per UR Regulation:    If discussed at Long Length of Stay Meetings, dates discussed:    Comments:

## 2012-08-06 NOTE — Progress Notes (Signed)
PT Treatment Note:   08/06/12 1148  PT Visit Information  Last PT Received On 08/06/12  Assistance Needed +1  PT Time Calculation  PT Start Time 1105  PT Stop Time 1132  PT Time Calculation (min) 27 min  Subjective Data  Subjective "Just waiting on everything to get finished."  Patient Stated Goal feel better  Precautions  Precautions Knee  Precaution Booklet Issued No  Required Braces or Orthoses Knee Immobilizer - Left  Knee Immobilizer - Left On when out of bed or walking  Restrictions  Weight Bearing Restrictions Yes  LLE Weight Bearing WBAT  Cognition  Overall Cognitive Status Appears within functional limits for tasks assessed/performed  Arousal/Alertness Awake/alert  Orientation Level Appears intact for tasks assessed  Behavior During Session [facility] for tasks performed  Bed Mobility  Bed Mobility Not assessed  Transfers  Transfers Sit to Stand;Stand to Sit  Sit to Stand 6: Modified independent (Device/Increase time);With upper extremity assist;From chair/3-in-1  Stand to Sit 6: Modified independent (Device/Increase time);With upper extremity assist;To chair/3-in-1  Details for Transfer Assistance Increased time.  Ambulation/Gait  Ambulation/Gait Assistance 5: Supervision  Ambulation Distance (Feet) 350 Feet  Assistive device Rolling walker  Ambulation/Gait Assistance Details Cues for safe sequence and continued attempts at step-through sequence.  Gait Pattern Step-to pattern;Step-through pattern;Decreased step length - left;Decreased stance time - left  Stairs No  Wheelchair Mobility  Wheelchair Mobility No  Balance  Balance Assessed No  Exercises  Exercises Total Joint  Total Joint Exercises  Ankle Circles/Pumps AROM;Left;10 reps;Supine  Quad Sets AROM;Left;10 reps;Supine  Heel Slides AROM;Left;5 reps;Supine  Hip ABduction/ADduction AROM;Left;10 reps;Supine  Straight Leg Raises AAROM;Left;10 reps;Supine  PT - End of Session  Equipment Utilized During  Treatment Gait belt;Left knee immobilizer  Activity Tolerance Patient tolerated treatment well  Patient left in chair;with call bell/phone within reach  Nurse Communication Mobility status  PT - Assessment/Plan  Comments on Treatment Session Pt admitted s/p left TKA and is very motivated to progress. Able to tolerate BID ambulation with increase in distance during second session. Pt is ready for safe d/c home once medically cleared by MD.  PT Plan Discharge plan remains appropriate;Frequency remains appropriate  PT Frequency 7X/week  Follow Up Recommendations Home health PT  Equipment Recommended None recommended by PT  Acute Rehab PT Goals  PT Goal Formulation With patient  Time For Goal Achievement 08/08/12  Potential to Achieve Goals Good  PT Goal: Stand - Progress Met  PT Goal: Ambulate - Progress Progressing toward goal  PT Goal: Perform Home Exercise Program - Progress Progressing toward goal  PT General Charges  $$ ACUTE PT VISIT 1 Procedure  PT Treatments  $Gait Training 8-22 mins  $Therapeutic Exercise 8-22 mins    08/06/2012 Cephus Shelling, PT, DPT [phone]

## 2012-08-07 DIAGNOSIS — M1710 Unilateral primary osteoarthritis, unspecified knee: Secondary | ICD-10-CM | POA: Diagnosis not present

## 2012-08-07 DIAGNOSIS — G8918 Other acute postprocedural pain: Secondary | ICD-10-CM | POA: Diagnosis not present

## 2012-08-07 DIAGNOSIS — J449 Chronic obstructive pulmonary disease, unspecified: Secondary | ICD-10-CM | POA: Diagnosis not present

## 2012-08-07 DIAGNOSIS — Z96659 Presence of unspecified artificial knee joint: Secondary | ICD-10-CM | POA: Diagnosis not present

## 2012-08-07 DIAGNOSIS — I10 Essential (primary) hypertension: Secondary | ICD-10-CM | POA: Diagnosis not present

## 2012-08-07 DIAGNOSIS — R269 Unspecified abnormalities of gait and mobility: Secondary | ICD-10-CM | POA: Diagnosis not present

## 2012-08-07 DIAGNOSIS — Z471 Aftercare following joint replacement surgery: Secondary | ICD-10-CM | POA: Diagnosis not present

## 2012-08-07 DIAGNOSIS — M6281 Muscle weakness (generalized): Secondary | ICD-10-CM | POA: Diagnosis not present

## 2012-08-08 DIAGNOSIS — Z471 Aftercare following joint replacement surgery: Secondary | ICD-10-CM | POA: Diagnosis not present

## 2012-08-08 DIAGNOSIS — M6281 Muscle weakness (generalized): Secondary | ICD-10-CM | POA: Diagnosis not present

## 2012-08-08 DIAGNOSIS — R269 Unspecified abnormalities of gait and mobility: Secondary | ICD-10-CM | POA: Diagnosis not present

## 2012-08-08 DIAGNOSIS — G8918 Other acute postprocedural pain: Secondary | ICD-10-CM | POA: Diagnosis not present

## 2012-08-08 DIAGNOSIS — J449 Chronic obstructive pulmonary disease, unspecified: Secondary | ICD-10-CM | POA: Diagnosis not present

## 2012-08-08 DIAGNOSIS — I10 Essential (primary) hypertension: Secondary | ICD-10-CM | POA: Diagnosis not present

## 2012-08-09 DIAGNOSIS — M6281 Muscle weakness (generalized): Secondary | ICD-10-CM | POA: Diagnosis not present

## 2012-08-09 DIAGNOSIS — R269 Unspecified abnormalities of gait and mobility: Secondary | ICD-10-CM | POA: Diagnosis not present

## 2012-08-09 DIAGNOSIS — Z471 Aftercare following joint replacement surgery: Secondary | ICD-10-CM | POA: Diagnosis not present

## 2012-08-09 DIAGNOSIS — G8918 Other acute postprocedural pain: Secondary | ICD-10-CM | POA: Diagnosis not present

## 2012-08-09 DIAGNOSIS — J449 Chronic obstructive pulmonary disease, unspecified: Secondary | ICD-10-CM | POA: Diagnosis not present

## 2012-08-09 DIAGNOSIS — I10 Essential (primary) hypertension: Secondary | ICD-10-CM | POA: Diagnosis not present

## 2012-08-10 DIAGNOSIS — I10 Essential (primary) hypertension: Secondary | ICD-10-CM | POA: Diagnosis not present

## 2012-08-10 DIAGNOSIS — M6281 Muscle weakness (generalized): Secondary | ICD-10-CM | POA: Diagnosis not present

## 2012-08-10 DIAGNOSIS — R269 Unspecified abnormalities of gait and mobility: Secondary | ICD-10-CM | POA: Diagnosis not present

## 2012-08-10 DIAGNOSIS — J449 Chronic obstructive pulmonary disease, unspecified: Secondary | ICD-10-CM | POA: Diagnosis not present

## 2012-08-10 DIAGNOSIS — G8918 Other acute postprocedural pain: Secondary | ICD-10-CM | POA: Diagnosis not present

## 2012-08-10 DIAGNOSIS — Z471 Aftercare following joint replacement surgery: Secondary | ICD-10-CM | POA: Diagnosis not present

## 2012-08-13 DIAGNOSIS — I10 Essential (primary) hypertension: Secondary | ICD-10-CM | POA: Diagnosis not present

## 2012-08-13 DIAGNOSIS — Z471 Aftercare following joint replacement surgery: Secondary | ICD-10-CM | POA: Diagnosis not present

## 2012-08-13 DIAGNOSIS — R269 Unspecified abnormalities of gait and mobility: Secondary | ICD-10-CM | POA: Diagnosis not present

## 2012-08-13 DIAGNOSIS — J449 Chronic obstructive pulmonary disease, unspecified: Secondary | ICD-10-CM | POA: Diagnosis not present

## 2012-08-13 DIAGNOSIS — G8918 Other acute postprocedural pain: Secondary | ICD-10-CM | POA: Diagnosis not present

## 2012-08-13 DIAGNOSIS — M6281 Muscle weakness (generalized): Secondary | ICD-10-CM | POA: Diagnosis not present

## 2012-08-14 ENCOUNTER — Telehealth: Payer: Self-pay

## 2012-08-14 DIAGNOSIS — G8918 Other acute postprocedural pain: Secondary | ICD-10-CM | POA: Diagnosis not present

## 2012-08-14 DIAGNOSIS — M6281 Muscle weakness (generalized): Secondary | ICD-10-CM | POA: Diagnosis not present

## 2012-08-14 DIAGNOSIS — Z471 Aftercare following joint replacement surgery: Secondary | ICD-10-CM | POA: Diagnosis not present

## 2012-08-14 DIAGNOSIS — R269 Unspecified abnormalities of gait and mobility: Secondary | ICD-10-CM | POA: Diagnosis not present

## 2012-08-14 DIAGNOSIS — I10 Essential (primary) hypertension: Secondary | ICD-10-CM | POA: Diagnosis not present

## 2012-08-14 DIAGNOSIS — J449 Chronic obstructive pulmonary disease, unspecified: Secondary | ICD-10-CM | POA: Diagnosis not present

## 2012-08-14 MED ORDER — LEVOTHYROXINE SODIUM 200 MCG PO TABS: 200.0000 ug | ORAL_TABLET | Freq: Every day | ORAL | Status: DC

## 2012-08-14 NOTE — Telephone Encounter (Signed)
Advise on Rx renewal for the Robaxin Rx pended.

## 2012-08-14 NOTE — Telephone Encounter (Signed)
Patient advised.

## 2012-08-14 NOTE — Telephone Encounter (Signed)
Rx for Synthroid and Robaxin already done

## 2012-08-14 NOTE — Telephone Encounter (Signed)
Pt called, he had to cancel appt due to having knee surgery recently. Pt will reschedule after first of the year when he is more mobile. But wants to know if thyroid medication and muscle relaxer for throat, can be called in to [facility] [address] at [address] Please call pt at [phone]

## 2012-08-15 ENCOUNTER — Other Ambulatory Visit: Payer: Self-pay | Admitting: Physician Assistant

## 2012-08-15 DIAGNOSIS — Z471 Aftercare following joint replacement surgery: Secondary | ICD-10-CM | POA: Diagnosis not present

## 2012-08-15 DIAGNOSIS — R269 Unspecified abnormalities of gait and mobility: Secondary | ICD-10-CM | POA: Diagnosis not present

## 2012-08-15 DIAGNOSIS — M6281 Muscle weakness (generalized): Secondary | ICD-10-CM | POA: Diagnosis not present

## 2012-08-15 DIAGNOSIS — J449 Chronic obstructive pulmonary disease, unspecified: Secondary | ICD-10-CM | POA: Diagnosis not present

## 2012-08-15 DIAGNOSIS — G8918 Other acute postprocedural pain: Secondary | ICD-10-CM | POA: Diagnosis not present

## 2012-08-15 DIAGNOSIS — I10 Essential (primary) hypertension: Secondary | ICD-10-CM | POA: Diagnosis not present

## 2012-08-16 DIAGNOSIS — J449 Chronic obstructive pulmonary disease, unspecified: Secondary | ICD-10-CM | POA: Diagnosis not present

## 2012-08-16 DIAGNOSIS — Z471 Aftercare following joint replacement surgery: Secondary | ICD-10-CM | POA: Diagnosis not present

## 2012-08-16 DIAGNOSIS — M6281 Muscle weakness (generalized): Secondary | ICD-10-CM | POA: Diagnosis not present

## 2012-08-16 DIAGNOSIS — I10 Essential (primary) hypertension: Secondary | ICD-10-CM | POA: Diagnosis not present

## 2012-08-16 DIAGNOSIS — R269 Unspecified abnormalities of gait and mobility: Secondary | ICD-10-CM | POA: Diagnosis not present

## 2012-08-16 DIAGNOSIS — G8918 Other acute postprocedural pain: Secondary | ICD-10-CM | POA: Diagnosis not present

## 2012-08-17 DIAGNOSIS — R269 Unspecified abnormalities of gait and mobility: Secondary | ICD-10-CM | POA: Diagnosis not present

## 2012-08-17 DIAGNOSIS — G8918 Other acute postprocedural pain: Secondary | ICD-10-CM | POA: Diagnosis not present

## 2012-08-17 DIAGNOSIS — I10 Essential (primary) hypertension: Secondary | ICD-10-CM | POA: Diagnosis not present

## 2012-08-17 DIAGNOSIS — M6281 Muscle weakness (generalized): Secondary | ICD-10-CM | POA: Diagnosis not present

## 2012-08-17 DIAGNOSIS — Z471 Aftercare following joint replacement surgery: Secondary | ICD-10-CM | POA: Diagnosis not present

## 2012-08-17 DIAGNOSIS — J449 Chronic obstructive pulmonary disease, unspecified: Secondary | ICD-10-CM | POA: Diagnosis not present

## 2012-08-20 ENCOUNTER — Ambulatory Visit: Payer: Medicare Other | Admitting: Internal Medicine

## 2012-08-20 DIAGNOSIS — G8918 Other acute postprocedural pain: Secondary | ICD-10-CM | POA: Diagnosis not present

## 2012-08-20 DIAGNOSIS — M6281 Muscle weakness (generalized): Secondary | ICD-10-CM | POA: Diagnosis not present

## 2012-08-20 DIAGNOSIS — J449 Chronic obstructive pulmonary disease, unspecified: Secondary | ICD-10-CM | POA: Diagnosis not present

## 2012-08-20 DIAGNOSIS — R269 Unspecified abnormalities of gait and mobility: Secondary | ICD-10-CM | POA: Diagnosis not present

## 2012-08-20 DIAGNOSIS — I10 Essential (primary) hypertension: Secondary | ICD-10-CM | POA: Diagnosis not present

## 2012-08-20 DIAGNOSIS — Z471 Aftercare following joint replacement surgery: Secondary | ICD-10-CM | POA: Diagnosis not present

## 2012-08-21 DIAGNOSIS — M1710 Unilateral primary osteoarthritis, unspecified knee: Secondary | ICD-10-CM | POA: Diagnosis not present

## 2012-08-23 DIAGNOSIS — G8918 Other acute postprocedural pain: Secondary | ICD-10-CM | POA: Diagnosis not present

## 2012-08-23 DIAGNOSIS — Z471 Aftercare following joint replacement surgery: Secondary | ICD-10-CM | POA: Diagnosis not present

## 2012-08-23 DIAGNOSIS — R269 Unspecified abnormalities of gait and mobility: Secondary | ICD-10-CM | POA: Diagnosis not present

## 2012-08-23 DIAGNOSIS — J449 Chronic obstructive pulmonary disease, unspecified: Secondary | ICD-10-CM | POA: Diagnosis not present

## 2012-08-23 DIAGNOSIS — I10 Essential (primary) hypertension: Secondary | ICD-10-CM | POA: Diagnosis not present

## 2012-08-23 DIAGNOSIS — M6281 Muscle weakness (generalized): Secondary | ICD-10-CM | POA: Diagnosis not present

## 2012-08-27 DIAGNOSIS — Z471 Aftercare following joint replacement surgery: Secondary | ICD-10-CM | POA: Diagnosis not present

## 2012-08-27 DIAGNOSIS — J449 Chronic obstructive pulmonary disease, unspecified: Secondary | ICD-10-CM | POA: Diagnosis not present

## 2012-08-27 DIAGNOSIS — I10 Essential (primary) hypertension: Secondary | ICD-10-CM | POA: Diagnosis not present

## 2012-08-27 DIAGNOSIS — M6281 Muscle weakness (generalized): Secondary | ICD-10-CM | POA: Diagnosis not present

## 2012-08-27 DIAGNOSIS — G8918 Other acute postprocedural pain: Secondary | ICD-10-CM | POA: Diagnosis not present

## 2012-08-27 DIAGNOSIS — R269 Unspecified abnormalities of gait and mobility: Secondary | ICD-10-CM | POA: Diagnosis not present

## 2012-09-04 DIAGNOSIS — M25569 Pain in unspecified knee: Secondary | ICD-10-CM | POA: Diagnosis not present

## 2012-09-04 DIAGNOSIS — Z96659 Presence of unspecified artificial knee joint: Secondary | ICD-10-CM | POA: Diagnosis not present

## 2012-09-06 DIAGNOSIS — Z96659 Presence of unspecified artificial knee joint: Secondary | ICD-10-CM | POA: Diagnosis not present

## 2012-09-06 DIAGNOSIS — M25569 Pain in unspecified knee: Secondary | ICD-10-CM | POA: Diagnosis not present

## 2012-09-10 DIAGNOSIS — M25569 Pain in unspecified knee: Secondary | ICD-10-CM | POA: Diagnosis not present

## 2012-09-10 DIAGNOSIS — Z96659 Presence of unspecified artificial knee joint: Secondary | ICD-10-CM | POA: Diagnosis not present

## 2012-09-11 DIAGNOSIS — M25569 Pain in unspecified knee: Secondary | ICD-10-CM | POA: Diagnosis not present

## 2012-09-12 DIAGNOSIS — M25569 Pain in unspecified knee: Secondary | ICD-10-CM | POA: Diagnosis not present

## 2012-09-12 DIAGNOSIS — Z96659 Presence of unspecified artificial knee joint: Secondary | ICD-10-CM | POA: Diagnosis not present

## 2012-09-18 DIAGNOSIS — M25569 Pain in unspecified knee: Secondary | ICD-10-CM | POA: Diagnosis not present

## 2012-09-18 DIAGNOSIS — Z96659 Presence of unspecified artificial knee joint: Secondary | ICD-10-CM | POA: Diagnosis not present

## 2012-09-20 DIAGNOSIS — Z96659 Presence of unspecified artificial knee joint: Secondary | ICD-10-CM | POA: Diagnosis not present

## 2012-09-20 DIAGNOSIS — M25569 Pain in unspecified knee: Secondary | ICD-10-CM | POA: Diagnosis not present

## 2012-09-25 DIAGNOSIS — Z96659 Presence of unspecified artificial knee joint: Secondary | ICD-10-CM | POA: Diagnosis not present

## 2012-09-25 DIAGNOSIS — M25569 Pain in unspecified knee: Secondary | ICD-10-CM | POA: Diagnosis not present

## 2012-09-27 DIAGNOSIS — M25569 Pain in unspecified knee: Secondary | ICD-10-CM | POA: Diagnosis not present

## 2012-09-27 DIAGNOSIS — Z96659 Presence of unspecified artificial knee joint: Secondary | ICD-10-CM | POA: Diagnosis not present

## 2012-10-05 DIAGNOSIS — Z96659 Presence of unspecified artificial knee joint: Secondary | ICD-10-CM | POA: Diagnosis not present

## 2012-10-05 DIAGNOSIS — M25569 Pain in unspecified knee: Secondary | ICD-10-CM | POA: Diagnosis not present

## 2012-10-09 DIAGNOSIS — Z96659 Presence of unspecified artificial knee joint: Secondary | ICD-10-CM | POA: Diagnosis not present

## 2012-10-09 DIAGNOSIS — M25569 Pain in unspecified knee: Secondary | ICD-10-CM | POA: Diagnosis not present

## 2012-10-11 DIAGNOSIS — M25569 Pain in unspecified knee: Secondary | ICD-10-CM | POA: Diagnosis not present

## 2012-10-11 DIAGNOSIS — Z96659 Presence of unspecified artificial knee joint: Secondary | ICD-10-CM | POA: Diagnosis not present

## 2012-10-16 DIAGNOSIS — M25569 Pain in unspecified knee: Secondary | ICD-10-CM | POA: Diagnosis not present

## 2012-10-16 DIAGNOSIS — Z96659 Presence of unspecified artificial knee joint: Secondary | ICD-10-CM | POA: Diagnosis not present

## 2012-10-23 DIAGNOSIS — M25569 Pain in unspecified knee: Secondary | ICD-10-CM | POA: Diagnosis not present

## 2012-10-23 DIAGNOSIS — Z96659 Presence of unspecified artificial knee joint: Secondary | ICD-10-CM | POA: Diagnosis not present

## 2012-10-25 DIAGNOSIS — Z96659 Presence of unspecified artificial knee joint: Secondary | ICD-10-CM | POA: Diagnosis not present

## 2012-10-25 DIAGNOSIS — M25569 Pain in unspecified knee: Secondary | ICD-10-CM | POA: Diagnosis not present

## 2012-10-30 DIAGNOSIS — M25569 Pain in unspecified knee: Secondary | ICD-10-CM | POA: Diagnosis not present

## 2012-10-30 DIAGNOSIS — Z96659 Presence of unspecified artificial knee joint: Secondary | ICD-10-CM | POA: Diagnosis not present

## 2012-11-05 ENCOUNTER — Encounter: Payer: Self-pay | Admitting: Internal Medicine

## 2012-11-05 ENCOUNTER — Ambulatory Visit (INDEPENDENT_AMBULATORY_CARE_PROVIDER_SITE_OTHER): Payer: Medicare Other | Admitting: Internal Medicine

## 2012-11-05 VITALS — BP 110/78 | HR 96 | Temp 98.5°F | Resp 16 | Ht 71.0 in | Wt 205.4 lb

## 2012-11-05 DIAGNOSIS — C73 Malignant neoplasm of thyroid gland: Secondary | ICD-10-CM

## 2012-11-05 DIAGNOSIS — J449 Chronic obstructive pulmonary disease, unspecified: Secondary | ICD-10-CM

## 2012-11-05 DIAGNOSIS — I10 Essential (primary) hypertension: Secondary | ICD-10-CM | POA: Diagnosis not present

## 2012-11-05 DIAGNOSIS — I447 Left bundle-branch block, unspecified: Secondary | ICD-10-CM | POA: Diagnosis not present

## 2012-11-05 DIAGNOSIS — F17200 Nicotine dependence, unspecified, uncomplicated: Secondary | ICD-10-CM

## 2012-11-05 DIAGNOSIS — Z0000 Encounter for general adult medical examination without abnormal findings: Secondary | ICD-10-CM

## 2012-11-05 DIAGNOSIS — M25569 Pain in unspecified knee: Secondary | ICD-10-CM | POA: Diagnosis not present

## 2012-11-05 DIAGNOSIS — Z13220 Encounter for screening for lipoid disorders: Secondary | ICD-10-CM

## 2012-11-05 DIAGNOSIS — K219 Gastro-esophageal reflux disease without esophagitis: Secondary | ICD-10-CM

## 2012-11-05 DIAGNOSIS — Z125 Encounter for screening for malignant neoplasm of prostate: Secondary | ICD-10-CM

## 2012-11-05 DIAGNOSIS — C61 Malignant neoplasm of prostate: Secondary | ICD-10-CM | POA: Diagnosis not present

## 2012-11-05 DIAGNOSIS — E039 Hypothyroidism, unspecified: Secondary | ICD-10-CM | POA: Diagnosis not present

## 2012-11-05 DIAGNOSIS — K2100 Gastro-esophageal reflux disease with esophagitis, without bleeding: Secondary | ICD-10-CM

## 2012-11-05 DIAGNOSIS — Z139 Encounter for screening, unspecified: Secondary | ICD-10-CM

## 2012-11-05 LAB — CBC WITH DIFFERENTIAL/PLATELET
Basophils Relative: 1 % (ref 0–1)
HCT: 42.2 % (ref 39.0–52.0)
Hemoglobin: 14.9 g/dL (ref 13.0–17.0)
Lymphocytes Relative: 24 % (ref 12–46)
MCHC: 35.3 g/dL (ref 30.0–36.0)
Monocytes Absolute: 1 10*3/uL (ref 0.1–1.0)
Monocytes Relative: 14 % — ABNORMAL HIGH (ref 3–12)
Neutro Abs: 4.2 10*3/uL (ref 1.7–7.7)
Neutrophils Relative %: 57 % (ref 43–77)
RBC: 5.17 MIL/uL (ref 4.22–5.81)
WBC: 7.3 10*3/uL (ref 4.0–10.5)

## 2012-11-05 LAB — COMPREHENSIVE METABOLIC PANEL
AST: 16 U/L (ref 0–37)
Albumin: 4.5 g/dL (ref 3.5–5.2)
Alkaline Phosphatase: 112 U/L (ref 39–117)
Calcium: 9.7 mg/dL (ref 8.4–10.5)
Chloride: 103 mEq/L (ref 96–112)
Glucose, Bld: 98 mg/dL (ref 70–99)
Potassium: 4.4 mEq/L (ref 3.5–5.3)
Sodium: 141 mEq/L (ref 135–145)
Total Protein: 7.1 g/dL (ref 6.0–8.3)

## 2012-11-05 LAB — POCT URINALYSIS DIPSTICK
Glucose, UA: NEGATIVE
Ketones, UA: NEGATIVE
Spec Grav, UA: 1.025

## 2012-11-05 LAB — LIPID PANEL
Cholesterol: 163 mg/dL (ref 0–200)
LDL Cholesterol: 92 mg/dL (ref 0–99)
VLDL: 33 mg/dL (ref 0–40)

## 2012-11-05 LAB — POCT UA - MICROSCOPIC ONLY: Bacteria, U Microscopic: NEGATIVE

## 2012-11-05 LAB — PSA, MEDICARE: PSA: 0.01 ng/mL (ref <=4.00)

## 2012-11-05 MED ORDER — PANTOPRAZOLE SODIUM 40 MG PO TBEC: 40.0000 mg | DELAYED_RELEASE_TABLET | Freq: Every day | ORAL | Status: DC

## 2012-11-05 MED ORDER — LEVOTHYROXINE SODIUM 200 MCG PO TABS: 200.0000 ug | ORAL_TABLET | Freq: Every day | ORAL | Status: DC

## 2012-11-05 NOTE — Patient Instructions (Signed)
Chronic Pain  Chronic pain can be defined as pain that is lasting, off and on, and lasts for 3 to 6 months or longer. Many things cause chronic pain, which can make it difficult to make a discrete diagnosis. There are many treatment options available for chronic pain. However, finding a treatment that works well for you may require trying various approaches until a suitable one is found.  CAUSES   In some types of chronic medical conditions, the pain is caused by a normal pain response within the body. A normal pain response helps the body identify illness or injury and prevent further damage from being done. In these cases, the cause of the pain may be identified and treated, even if it may not be cured completely. Examples of chronic conditions which can cause chronic pain include:   Inflammation of the joints (arthritis).   Back pain or neck pain (including bulging or herniated disks).   Migraine headaches.   Cancer.  In some other types of chronic pain syndromes, the pain is caused by an abnormal pain response within the body. An abnormal pain response is present when there is no ongoing cause (or stimulus) for the pain, or when the cause of the pain is arising from the nerves or nervous system itself. Examples of conditions which can cause chronic pain due to an abnormal pain response include:   Fibromyalgia.   Reflex sympathetic dystrophy (RSD).   Neuropathy (when the nerves themselves are damaged, and may cause pain).  DIAGNOSIS   Your caregiver will help diagnose your condition over time. In many cases, the initial focus will be on excluding conditions that could be causing the pain. Depending on your symptoms, your caregiver may order some tests to diagnose your condition. Some of these tests include:   Blood tests.   Computerized X-ray scans (CT scan).   Computerized magnetic scans (MRI).   X-rays.   Ultrasounds.   Nerve conduction studies.    Consultation with other physicians or specialists.  TREATMENT   There are many treatment options for people suffering from chronic pain. Finding a treatment that works well may take time.    You may be referred to a pain management specialist.   You may be put on medication to help with the pain. Unfortunately, some medications (such as opiate medications) may not be very effective in cases where chronic pain is due to abnormal pain responses. Finding the right medications can take some time.   Adjunctive therapies may be used to provide additional relief and improve a patient's quality of life. These therapies include:   Mindfulness meditation.   Acupuncture.   Biofeedback.   Cognitive-behavioral therapy.   In certain cases, surgical interventions may be attempted.  HOME CARE INSTRUCTIONS    Make sure you understand these instructions prior to discharge.   Ask any questions and share any further concerns you have with your caregiver prior to discharge.   Take all medications as directed by your caregiver.   Keep all follow-up appointments.  SEEK MEDICAL CARE IF:    Your pain gets worse.   You develop a new pain that was not present before.   You cannot tolerate any medications prescribed by your caregiver.   You develop new symptoms since your last visit with your caregiver.  SEEK IMMEDIATE MEDICAL CARE IF:    You develop muscular weakness.   You have decreased sensation or numbness.   You lose control of bowel or bladder function.     Your pain suddenly gets much worse.   You have an oral temperature above 102 F (38.9 C), not controlled by medication.   You develop shaking chills, confusion, chest pain, or shortness of breath.  Document Released: 06/11/2002 Document Revised: 12/12/2011 Document Reviewed: 09/17/2008  Exit[employer] Patient Information 2013 [employer]  Smoking Cessation   Quitting smoking is important to your health and has many advantages. However, it is not always easy to quit since nicotine is a very addictive drug. Often times, people try 3 times or more before being able to quit. This document explains the best ways for you to prepare to quit smoking. Quitting takes hard work and a lot of effort, but you can do it.  ADVANTAGES OF QUITTING SMOKING   You will live longer, feel better, and live better.   Your body will feel the impact of quitting smoking almost immediately.   Within 20 minutes, blood pressure decreases. Your pulse returns to its normal level.   After 8 hours, carbon monoxide levels in the blood return to normal. Your oxygen level increases.   After 24 hours, the chance of having a heart attack starts to decrease. Your breath, hair, and body stop smelling like smoke.   After 48 hours, damaged nerve endings begin to recover. Your sense of taste and smell improve.   After 72 hours, the body is virtually free of nicotine. Your bronchial tubes relax and breathing becomes easier.   After 2 to 12 weeks, lungs can hold more air. Exercise becomes easier and circulation improves.   The risk of having a heart attack, stroke, cancer, or lung disease is greatly reduced.   After 1 year, the risk of coronary heart disease is cut in half.   After 5 years, the risk of stroke falls to the same as a nonsmoker.   After 10 years, the risk of lung cancer is cut in half and the risk of other cancers decreases significantly.   After 15 years, the risk of coronary heart disease drops, usually to the level of a nonsmoker.   If you are pregnant, quitting smoking will improve your chances of having a healthy baby.   The people you live with, especially any children, will be healthier.   You will have extra money to spend on things other than cigarettes.  QUESTIONS TO THINK ABOUT BEFORE ATTEMPTING TO QUIT  You may want to talk about your answers with your caregiver.    Why do you want to quit?   If you tried to quit in the past, what helped and what did not?   What will be the most difficult situations for you after you quit? How will you plan to handle them?   Who can help you through the tough times? Your family? Friends? A caregiver?   What pleasures do you get from smoking? What ways can you still get pleasure if you quit?  Here are some questions to ask your caregiver:   How can you help me to be successful at quitting?   What medicine do you think would be best for me and how should I take it?   What should I do if I need more help?   What is smoking withdrawal like? How can I get information on withdrawal?  GET READY   Set a quit date.   Change your environment by getting rid of all cigarettes, ashtrays, matches, and lighters in your home, car, or work. Do not let people smoke in your   home.   Review your past attempts to quit. Think about what worked and what did not.  GET SUPPORT AND ENCOURAGEMENT  You have a better chance of being successful if you have help. You can get support in many ways.   Tell your family, friends, and co-workers that you are going to quit and need their support. Ask them not to smoke around you.   Get individual, group, or telephone counseling and support. Programs are available at local hospitals and health centers. Call your local health department for information about programs in your area.   Spiritual beliefs and practices may help some smokers quit.   Download a "quit meter" on your computer to keep track of quit statistics, such as how long you have gone without smoking, cigarettes not smoked, and money saved.   Get a self-help book about quitting smoking and staying off of tobacco.  LEARN NEW SKILLS AND BEHAVIORS   Distract yourself from urges to smoke. Talk to someone, go for a walk, or occupy your time with a task.    Change your normal routine. Take a different route to work. Drink tea instead of coffee. Eat breakfast in a different place.   Reduce your stress. Take a hot bath, exercise, or read a book.   Plan something enjoyable to do every day. Reward yourself for not smoking.   Explore interactive web-based programs that specialize in helping you quit.  GET MEDICINE AND USE IT CORRECTLY  Medicines can help you stop smoking and decrease the urge to smoke. Combining medicine with the above behavioral methods and support can greatly increase your chances of successfully quitting smoking.   Nicotine replacement therapy helps deliver nicotine to your body without the negative effects and risks of smoking. Nicotine replacement therapy includes nicotine gum, lozenges, inhalers, nasal sprays, and skin patches. Some may be available over-the-counter and others require a prescription.   Antidepressant medicine helps people abstain from smoking, but how this works is unknown. This medicine is available by prescription.   Nicotinic receptor partial agonist medicine simulates the effect of nicotine in your brain. This medicine is available by prescription.  Ask your caregiver for advice about which medicines to use and how to use them based on your health history. Your caregiver will tell you what side effects to look out for if you choose to be on a medicine or therapy. Carefully read the information on the package. Do not use any other product containing nicotine while using a nicotine replacement product.   RELAPSE OR DIFFICULT SITUATIONS   Most relapses occur within the first 3 months after quitting. Do not be discouraged if you start smoking again. Remember, most people try several times before finally quitting. You may have symptoms of withdrawal because your body is used to nicotine. You may crave cigarettes, be irritable, feel very hungry, cough often, get headaches, or have difficulty concentrating. The withdrawal symptoms are only temporary. They are strongest when you first quit, but they will go away within 10 14 days.  To reduce the chances of relapse, try to:   Avoid drinking alcohol. Drinking lowers your chances of successfully quitting.   Reduce the amount of caffeine you consume. Once you quit smoking, the amount of caffeine in your body increases and can give you symptoms, such as a rapid heartbeat, sweating, and anxiety.   Avoid smokers because they can make you want to smoke.   Do not let weight gain distract you. Many smokers will gain weight

## 2012-11-05 NOTE — Progress Notes (Signed)
Subjective:    Patient ID: Arthur [name], male    DOB: Nov 20, 1940, 72 y.o.   MRN: [id number]  HPI  Here for CPE. See problem list carefully reviewed and med list. Prostate cnacer is stable, has ed and some incontinence. Continues working on a farm. Had Total left knee repacement left few weeks ago and has pain still, Dr. Luiz Blare. Review of Systems  Constitutional: Negative.   HENT: Negative.   Eyes: Negative.   Respiratory: Positive for cough.   Cardiovascular: Negative.   Gastrointestinal: Negative.   Genitourinary: Negative.   Musculoskeletal: Positive for back pain and arthralgias.  Neurological: Negative.   Hematological: Negative.   Psychiatric/Behavioral: Negative.        Objective:   Physical Exam  Vitals reviewed. Constitutional: He is oriented to person, place, and time. He appears well-developed and well-nourished.  HENT:  Right Ear: External ear normal.  Left Ear: External ear normal.  Nose: Nose normal.  Mouth/Throat: Oropharynx is clear and moist.  Eyes: Conjunctivae normal and EOM are normal. Pupils are equal, round, and reactive to light. No scleral icterus.  Neck: Normal range of motion. No thyromegaly present.  Cardiovascular: Normal rate, regular rhythm and normal heart sounds.   Pulmonary/Chest: Breath sounds normal.  Abdominal: Soft. Bowel sounds are normal. There is no tenderness.  Genitourinary: Penis normal.  Musculoskeletal: He exhibits tenderness.  Lymphadenopathy:    He has no cervical adenopathy.  Neurological: He is oriented to person, place, and time. He has normal strength. No cranial nerve deficit. Gait abnormal. Coordination normal.  Skin: Skin is warm and dry.  Psychiatric: He has a normal mood and affect. His behavior is normal. Judgment and thought content normal.   Results for orders placed during the hospital encounter of 08/03/12  PROTIME-INR      Component Value Range   Prothrombin Time 13.9  11.6 - 15.2 seconds   INR 1.08   0.00 - 1.49  CBC      Component Value Range   WBC 14.3 (*) 4.0 - 10.5 K/uL   RBC 3.61 (*) 4.22 - 5.81 MIL/uL   Hemoglobin 10.3 (*) 13.0 - 17.0 g/dL   HCT 98.1 (*) 19.1 - 47.8 %   MCV 88.6  78.0 - 100.0 fL   MCH 28.5  26.0 - 34.0 pg   MCHC 32.2  30.0 - 36.0 g/dL   RDW 29.5  62.1 - 30.8 %   Platelets 209  150 - 400 K/uL  BASIC METABOLIC PANEL      Component Value Range   Sodium 134 (*) 135 - 145 mEq/L   Potassium 3.9  3.5 - 5.1 mEq/L   Chloride 98  96 - 112 mEq/L   CO2 26  19 - 32 mEq/L   Glucose, Bld 140 (*) 70 - 99 mg/dL   BUN 20  6 - 23 mg/dL   Creatinine, Ser 6.57  0.50 - 1.35 mg/dL   Calcium 8.6  8.4 - 84.6 mg/dL   GFR calc non Af Amer 84 (*) >90 mL/min   GFR calc Af Amer >90  >90 mL/min  PROTIME-INR      Component Value Range   Prothrombin Time 25.4 (*) 11.6 - 15.2 seconds   INR 2.44 (*) 0.00 - 1.49  CBC      Component Value Range   WBC 12.0 (*) 4.0 - 10.5 K/uL   RBC 3.50 (*) 4.22 - 5.81 MIL/uL   Hemoglobin 10.1 (*) 13.0 - 17.0 g/dL   HCT 96.2 (*)  39.0 - 52.0 %   MCV 90.0  78.0 - 100.0 fL   MCH 28.9  26.0 - 34.0 pg   MCHC 32.1  30.0 - 36.0 g/dL   RDW 09.8  11.9 - 14.7 %   Platelets 205  150 - 400 K/uL  PROTIME-INR      Component Value Range   Prothrombin Time 22.1 (*) 11.6 - 15.2 seconds   INR 2.03 (*) 0.00 - 1.49  CBC      Component Value Range   WBC 10.2  4.0 - 10.5 K/uL   RBC 3.76 (*) 4.22 - 5.81 MIL/uL   Hemoglobin 10.9 (*) 13.0 - 17.0 g/dL   HCT 82.9 (*) 56.2 - 13.0 %   MCV 89.6  78.0 - 100.0 fL   MCH 29.0  26.0 - 34.0 pg   MCHC 32.3  30.0 - 36.0 g/dL   RDW 86.5  78.4 - 69.6 %   Platelets 244  150 - 400 K/uL   Post surgical labs/Anemia should be resolved        Assessment & Plan:  Agrees to finish quit smoking. RF meds 1 year

## 2012-11-06 ENCOUNTER — Encounter: Payer: Self-pay | Admitting: *Deleted

## 2012-11-06 DIAGNOSIS — Z96659 Presence of unspecified artificial knee joint: Secondary | ICD-10-CM | POA: Diagnosis not present

## 2012-11-06 DIAGNOSIS — M25569 Pain in unspecified knee: Secondary | ICD-10-CM | POA: Diagnosis not present

## 2012-11-10 ENCOUNTER — Ambulatory Visit (INDEPENDENT_AMBULATORY_CARE_PROVIDER_SITE_OTHER): Payer: Medicare Other | Admitting: Family Medicine

## 2012-11-10 VITALS — BP 92/56 | HR 71 | Temp 99.9°F | Resp 20 | Ht 71.0 in | Wt 204.8 lb

## 2012-11-10 DIAGNOSIS — R05 Cough: Secondary | ICD-10-CM

## 2012-11-10 DIAGNOSIS — R059 Cough, unspecified: Secondary | ICD-10-CM

## 2012-11-10 DIAGNOSIS — B9789 Other viral agents as the cause of diseases classified elsewhere: Secondary | ICD-10-CM | POA: Diagnosis not present

## 2012-11-10 DIAGNOSIS — R509 Fever, unspecified: Secondary | ICD-10-CM | POA: Diagnosis not present

## 2012-11-10 DIAGNOSIS — B349 Viral infection, unspecified: Secondary | ICD-10-CM

## 2012-11-10 LAB — POCT INFLUENZA A/B
Influenza A, POC: NEGATIVE
Influenza B, POC: NEGATIVE

## 2012-11-10 MED ORDER — OSELTAMIVIR PHOSPHATE 75 MG PO CAPS: 75.0000 mg | ORAL_CAPSULE | Freq: Two times a day (BID) | ORAL | Status: DC

## 2012-11-10 MED ORDER — HYDROCODONE-HOMATROPINE 5-1.5 MG/5ML PO SYRP: 5.0000 mL | ORAL_SOLUTION | ORAL | Status: DC | PRN

## 2012-11-10 NOTE — Patient Instructions (Addendum)

## 2012-11-10 NOTE — Progress Notes (Signed)
Subjective: Awakened with fever in the night. It was up to over 101 this morning. He chilling badly. Headache, myalgias, runny nose, cough. He did get a flu shot.  Objective: Looks like someone with the flu. TMs normal. Nose congested. Face flushed. Throat clear. Neck supple without nodes. Chest clear. Heart regular without murmurs. Flu test is negative  Assessment: Viral syndrome, probably influenza even though test was negative  Plan: Offer him treatment options.

## 2012-11-21 DIAGNOSIS — N393 Stress incontinence (female) (male): Secondary | ICD-10-CM | POA: Diagnosis not present

## 2012-11-21 DIAGNOSIS — Z8546 Personal history of malignant neoplasm of prostate: Secondary | ICD-10-CM | POA: Diagnosis not present

## 2012-11-21 DIAGNOSIS — N529 Male erectile dysfunction, unspecified: Secondary | ICD-10-CM | POA: Diagnosis not present

## 2013-01-24 DIAGNOSIS — T1500XA Foreign body in cornea, unspecified eye, initial encounter: Secondary | ICD-10-CM | POA: Diagnosis not present

## 2013-01-28 DIAGNOSIS — H2510 Age-related nuclear cataract, unspecified eye: Secondary | ICD-10-CM | POA: Diagnosis not present

## 2013-01-28 DIAGNOSIS — H1045 Other chronic allergic conjunctivitis: Secondary | ICD-10-CM | POA: Diagnosis not present

## 2013-01-28 DIAGNOSIS — H01009 Unspecified blepharitis unspecified eye, unspecified eyelid: Secondary | ICD-10-CM | POA: Diagnosis not present

## 2013-06-27 ENCOUNTER — Ambulatory Visit (INDEPENDENT_AMBULATORY_CARE_PROVIDER_SITE_OTHER): Payer: Medicare Other | Admitting: Family Medicine

## 2013-06-27 ENCOUNTER — Ambulatory Visit: Payer: Medicare Other

## 2013-06-27 VITALS — BP 120/76 | HR 67 | Temp 97.8°F | Resp 18 | Ht 71.0 in | Wt 202.0 lb

## 2013-06-27 DIAGNOSIS — G43909 Migraine, unspecified, not intractable, without status migrainosus: Secondary | ICD-10-CM | POA: Diagnosis not present

## 2013-06-27 DIAGNOSIS — M545 Low back pain: Secondary | ICD-10-CM

## 2013-06-27 DIAGNOSIS — Z23 Encounter for immunization: Secondary | ICD-10-CM | POA: Diagnosis not present

## 2013-06-27 LAB — POCT URINALYSIS DIPSTICK
Bilirubin, UA: NEGATIVE
Glucose, UA: NEGATIVE
Ketones, UA: NEGATIVE
Spec Grav, UA: 1.02
Urobilinogen, UA: 0.2

## 2013-06-27 LAB — POCT UA - MICROSCOPIC ONLY
Casts, Ur, LPF, POC: NEGATIVE
RBC, urine, microscopic: NEGATIVE

## 2013-06-27 MED ORDER — RIZATRIPTAN BENZOATE 10 MG PO TABS: 10.0000 mg | ORAL_TABLET | ORAL | Status: DC | PRN

## 2013-06-27 MED ORDER — METHOCARBAMOL 500 MG PO TABS: 500.0000 mg | ORAL_TABLET | Freq: Four times a day (QID) | ORAL | Status: DC | PRN
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
## 2013-06-27 NOTE — Patient Instructions (Addendum)
Use the robaxin as needed for back pain. Let me know if your back does not feel better in the next few days- Sooner if worse.    flu shot done today

## 2013-11-11 ENCOUNTER — Other Ambulatory Visit: Payer: Self-pay | Admitting: Internal Medicine

## 2013-11-21 ENCOUNTER — Other Ambulatory Visit: Payer: Self-pay | Admitting: Internal Medicine

## 2014-01-21 ENCOUNTER — Ambulatory Visit (INDEPENDENT_AMBULATORY_CARE_PROVIDER_SITE_OTHER): Payer: Medicare Other | Admitting: Internal Medicine

## 2014-01-21 VITALS — BP 130/82 | HR 63 | Temp 97.3°F | Resp 16 | Ht 72.0 in | Wt 200.6 lb

## 2014-01-21 DIAGNOSIS — M545 Low back pain, unspecified: Secondary | ICD-10-CM

## 2014-01-21 DIAGNOSIS — I447 Left bundle-branch block, unspecified: Secondary | ICD-10-CM

## 2014-01-21 DIAGNOSIS — Z85850 Personal history of malignant neoplasm of thyroid: Secondary | ICD-10-CM

## 2014-01-21 DIAGNOSIS — G43909 Migraine, unspecified, not intractable, without status migrainosus: Secondary | ICD-10-CM

## 2014-01-21 DIAGNOSIS — Z139 Encounter for screening, unspecified: Secondary | ICD-10-CM | POA: Diagnosis not present

## 2014-01-21 DIAGNOSIS — E039 Hypothyroidism, unspecified: Secondary | ICD-10-CM

## 2014-01-21 DIAGNOSIS — Z8546 Personal history of malignant neoplasm of prostate: Secondary | ICD-10-CM | POA: Diagnosis not present

## 2014-01-21 DIAGNOSIS — Z0000 Encounter for general adult medical examination without abnormal findings: Secondary | ICD-10-CM | POA: Diagnosis not present

## 2014-01-21 DIAGNOSIS — F17200 Nicotine dependence, unspecified, uncomplicated: Secondary | ICD-10-CM | POA: Diagnosis not present

## 2014-01-21 LAB — POCT URINALYSIS DIPSTICK
Blood, UA: NEGATIVE
GLUCOSE UA: NEGATIVE
Ketones, UA: NEGATIVE
LEUKOCYTES UA: NEGATIVE
Nitrite, UA: NEGATIVE
Protein, UA: NEGATIVE
Spec Grav, UA: 1.02
UROBILINOGEN UA: 0.2
pH, UA: 7

## 2014-01-21 LAB — TSH: TSH: 87.74 u[IU]/mL — ABNORMAL HIGH (ref 0.350–4.500)

## 2014-01-21 LAB — POCT CBC
GRANULOCYTE PERCENT: 64.6 % (ref 37–80)
HEMATOCRIT: 48.6 % (ref 43.5–53.7)
Hemoglobin: 15.6 g/dL (ref 14.1–18.1)
Lymph, poc: 2.4 (ref 0.6–3.4)
MCH, POC: 30.1 pg (ref 27–31.2)
MCHC: 32.1 g/dL (ref 31.8–35.4)
MCV: 93.8 fL (ref 80–97)
MID (CBC): 0.6 (ref 0–0.9)
MPV: 10 fL (ref 0–99.8)
PLATELET COUNT, POC: 258 10*3/uL (ref 142–424)
POC GRANULOCYTE: 5.6 (ref 2–6.9)
POC LYMPH PERCENT: 28.1 %L (ref 10–50)
POC MID %: 7.3 %M (ref 0–12)
RBC: 5.18 M/uL (ref 4.69–6.13)
RDW, POC: 13.1 %
WBC: 8.6 10*3/uL (ref 4.6–10.2)

## 2014-01-21 LAB — COMPREHENSIVE METABOLIC PANEL
ALBUMIN: 4.7 g/dL (ref 3.5–5.2)
ALK PHOS: 89 U/L (ref 39–117)
ALT: 17 U/L (ref 0–53)
AST: 19 U/L (ref 0–37)
BUN: 17 mg/dL (ref 6–23)
CALCIUM: 9.6 mg/dL (ref 8.4–10.5)
CHLORIDE: 100 meq/L (ref 96–112)
CO2: 29 mEq/L (ref 19–32)
Creat: 0.92 mg/dL (ref 0.50–1.35)
Glucose, Bld: 88 mg/dL (ref 70–99)
POTASSIUM: 4.3 meq/L (ref 3.5–5.3)
SODIUM: 140 meq/L (ref 135–145)
TOTAL PROTEIN: 7.3 g/dL (ref 6.0–8.3)
Total Bilirubin: 0.5 mg/dL (ref 0.2–1.2)

## 2014-01-21 LAB — POCT UA - MICROSCOPIC ONLY
Bacteria, U Microscopic: NEGATIVE
CASTS, UR, LPF, POC: NEGATIVE
Crystals, Ur, HPF, POC: NEGATIVE
Epithelial cells, urine per micros: NEGATIVE
Mucus, UA: NEGATIVE
Yeast, UA: NEGATIVE

## 2014-01-21 LAB — LIPID PANEL
Cholesterol: 207 mg/dL — ABNORMAL HIGH (ref 0–200)
HDL: 42 mg/dL (ref >39)
LDL Cholesterol: 124 mg/dL — ABNORMAL HIGH (ref 0–99)
Total CHOL/HDL Ratio: 4.9 Ratio
Triglycerides: 206 mg/dL — ABNORMAL HIGH (ref <150)
VLDL: 41 mg/dL — ABNORMAL HIGH (ref 0–40)

## 2014-01-21 LAB — IFOBT (OCCULT BLOOD): IFOBT: NEGATIVE

## 2014-01-21 MED ORDER — TADALAFIL 5 MG PO TABS: 5.0000 mg | ORAL_TABLET | Freq: Every day | ORAL | Status: DC | PRN

## 2014-01-21 MED ORDER — LEVOTHYROXINE SODIUM 200 MCG PO TABS: 200.0000 ug | ORAL_TABLET | Freq: Every day | ORAL | Status: DC

## 2014-01-21 MED ORDER — METHOCARBAMOL 500 MG PO TABS: 500.0000 mg | ORAL_TABLET | Freq: Four times a day (QID) | ORAL | Status: DC | PRN

## 2014-01-21 MED ORDER — RIZATRIPTAN BENZOATE 10 MG PO TABS: 10.0000 mg | ORAL_TABLET | ORAL | Status: DC | PRN

## 2014-01-21 NOTE — Progress Notes (Signed)
   Subjective:    Patient ID: Arthur [name]., male    DOB: 1940-10-28, 73 y.o.   MRN: [id number]  HPI    Review of Systems     Objective:   Physical Exam        Assessment & Plan:

## 2014-01-21 NOTE — Patient Instructions (Signed)
Arthritis, Nonspecific Arthritis is inflammation of a joint. This usually means pain, redness, warmth or swelling are present. One or more joints may be involved. There are a number of types of arthritis. Your caregiver may not be able to tell what type of arthritis you have right away. CAUSES  The most common cause of arthritis is the wear and tear on the joint (osteoarthritis). This causes damage to the cartilage, which can break down over time. The knees, hips, back and neck are most often affected by this type of arthritis. Other types of arthritis and common causes of joint pain include:  Sprains and other injuries near the joint. Sometimes minor sprains and injuries cause pain and swelling that develop hours later.  Rheumatoid arthritis. This affects hands, feet and knees. It usually affects both sides of your body at the same time. It is often associated with chronic ailments, fever, weight loss and general weakness.  Crystal arthritis. Gout and pseudo gout can cause occasional acute severe pain, redness and swelling in the foot, ankle, or knee.  Infectious arthritis. Bacteria can get into a joint through a break in overlying skin. This can cause infection of the joint. Bacteria and viruses can also spread through the blood and affect your joints.  Drug, infectious and allergy reactions. Sometimes joints can become mildly painful and slightly swollen with these types of illnesses. SYMPTOMS   Pain is the main symptom.  Your joint or joints can also be red, swollen and warm or hot to the touch.  You may have a fever with certain types of arthritis, or even feel overall ill.  The joint with arthritis will hurt with movement. Stiffness is present with some types of arthritis. DIAGNOSIS  Your caregiver will suspect arthritis based on your description of your symptoms and on your exam. Testing may be needed to find the type of arthritis:  Blood and sometimes urine tests.  X-ray tests  and sometimes CT or MRI scans.  Removal of fluid from the joint (arthrocentesis) is done to check for bacteria, crystals or other causes. Your caregiver (or a specialist) will numb the area over the joint with a local anesthetic, and use a needle to remove joint fluid for examination. This procedure is only minimally uncomfortable.  Even with these tests, your caregiver may not be able to tell what kind of arthritis you have. Consultation with a specialist (rheumatologist) may be helpful. TREATMENT  Your caregiver will discuss with you treatment specific to your type of arthritis. If the specific type cannot be determined, then the following general recommendations may apply. Treatment of severe joint pain includes:  Rest.  Elevation.  Anti-inflammatory medication (for example, ibuprofen) may be prescribed. Avoiding activities that cause increased pain.  Only take over-the-counter or prescription medicines for pain and discomfort as recommended by your caregiver.  Cold packs over an inflamed joint may be used for 10 to 15 minutes every hour. Hot packs sometimes feel better, but do not use overnight. Do not use hot packs if you are diabetic without your caregiver's permission.  A cortisone shot into arthritic joints may help reduce pain and swelling.  Any acute arthritis that gets worse over the next 1 to 2 days needs to be looked at to be sure there is no joint infection. Long-term arthritis treatment involves modifying activities and lifestyle to reduce joint stress jarring. This can include weight loss. Also, exercise is needed to nourish the joint cartilage and remove waste. This helps keep the muscles   around the joint strong. HOME CARE INSTRUCTIONS   Do not take aspirin to relieve pain if gout is suspected. This elevates uric acid levels.  Only take over-the-counter or prescription medicines for pain, discomfort or fever as directed by your caregiver.  Rest the joint as much as  possible.  If your joint is swollen, keep it elevated.  Use crutches if the painful joint is in your leg.  Drinking plenty of fluids may help for certain types of arthritis.  Follow your caregiver's dietary instructions.  Try low-impact exercise such as:  Swimming.  Water aerobics.  Biking.  Walking.  Morning stiffness is often relieved by a warm shower.  Put your joints through regular range-of-motion. SEEK MEDICAL CARE IF:   You do not feel better in 24 hours or are getting worse.  You have side effects to medications, or are not getting better with treatment. SEEK IMMEDIATE MEDICAL CARE IF:   You have a fever.  You develop severe joint pain, swelling or redness.  Many joints are involved and become painful and swollen.  There is severe back pain and/or leg weakness.  You have loss of bowel or bladder control. Document Released: 10/27/2004 Document Revised: 12/12/2011 Document Reviewed: 11/12/2008 [facility] Patient Information 2014 [place]. Smoking Cessation Quitting smoking is important to your health and has many advantages. However, it is not always easy to quit since nicotine is a very addictive drug. Often times, people try 3 times or more before being able to quit. This document explains the best ways for you to prepare to quit smoking. Quitting takes hard work and a lot of effort, but you can do it. ADVANTAGES OF QUITTING SMOKING  You will live longer, feel better, and live better.  Your body will feel the impact of quitting smoking almost immediately.  Within 20 minutes, blood pressure decreases. Your pulse returns to its normal level.  After 8 hours, carbon monoxide levels in the blood return to normal. Your oxygen level increases.  After 24 hours, the chance of having a heart attack starts to decrease. Your breath, hair, and body stop smelling like smoke.  After 48 hours, damaged nerve endings begin to recover. Your sense of taste and smell  improve.  After 72 hours, the body is virtually free of nicotine. Your bronchial tubes relax and breathing becomes easier.  After 2 to 12 weeks, lungs can hold more air. Exercise becomes easier and circulation improves.  The risk of having a heart attack, stroke, cancer, or lung disease is greatly reduced.  After 1 year, the risk of coronary heart disease is cut in half.  After 5 years, the risk of stroke falls to the same as a nonsmoker.  After 10 years, the risk of lung cancer is cut in half and the risk of other cancers decreases significantly.  After 15 years, the risk of coronary heart disease drops, usually to the level of a nonsmoker.  If you are pregnant, quitting smoking will improve your chances of having a healthy baby.  The people you live with, especially any children, will be healthier.  You will have extra money to spend on things other than cigarettes. QUESTIONS TO THINK ABOUT BEFORE ATTEMPTING TO QUIT You may want to talk about your answers with your caregiver.  Why do you want to quit?  If you tried to quit in the past, what helped and what did not?  What will be the most difficult situations for you after you quit? How will you  plan to handle them?  Who can help you through the tough times? Your family? Friends? A caregiver?  What pleasures do you get from smoking? What ways can you still get pleasure if you quit? Here are some questions to ask your caregiver:  How can you help me to be successful at quitting?  What medicine do you think would be best for me and how should I take it?  What should I do if I need more help?  What is smoking withdrawal like? How can I get information on withdrawal? GET READY  Set a quit date.  Change your environment by getting rid of all cigarettes, ashtrays, matches, and lighters in your home, car, or work. Do not let people smoke in your home.  Review your past attempts to quit. Think about what worked and what did  not. GET SUPPORT AND ENCOURAGEMENT You have a better chance of being successful if you have help. You can get support in many ways.  Tell your family, friends, and co-workers that you are going to quit and need their support. Ask them not to smoke around you.  Get individual, group, or telephone counseling and support. Programs are available at [employer] and health centers. Call your local health department for information about programs in your area.  Spiritual beliefs and practices may help some smokers quit.  Download a "quit meter" on your computer to keep track of quit statistics, such as how long you have gone without smoking, cigarettes not smoked, and money saved.  Get a self-help book about quitting smoking and staying off of tobacco. [place] yourself from urges to smoke. Talk to someone, go for a walk, or occupy your time with a task.  Change your normal routine. Take a different route to work. Drink tea instead of coffee. Eat breakfast in a different place.  Reduce your stress. Take a hot bath, exercise, or read a book.  Plan something enjoyable to do every day. Reward yourself for not smoking.  Explore interactive web-based programs that specialize in helping you quit. GET MEDICINE AND USE IT CORRECTLY Medicines can help you stop smoking and decrease the urge to smoke. Combining medicine with the above behavioral methods and support can greatly increase your chances of successfully quitting smoking.  Nicotine replacement therapy helps deliver nicotine to your body without the negative effects and risks of smoking. Nicotine replacement therapy includes nicotine gum, lozenges, inhalers, nasal sprays, and skin patches. Some may be available over-the-counter and others require a prescription.  Antidepressant medicine helps people abstain from smoking, but how this works is unknown. This medicine is available by prescription.  Nicotinic  receptor partial agonist medicine simulates the effect of nicotine in your brain. This medicine is available by prescription. Ask your caregiver for advice about which medicines to use and how to use them based on your health history. Your caregiver will tell you what side effects to look out for if you choose to be on a medicine or therapy. Carefully read the information on the package. Do not use any other product containing nicotine while using a nicotine replacement product.  RELAPSE OR DIFFICULT SITUATIONS Most relapses occur within the first 3 months after quitting. Do not be discouraged if you start smoking again. Remember, most people try several times before finally quitting. You may have symptoms of withdrawal because your body is used to nicotine. You may crave cigarettes, be irritable, feel very hungry, cough often, get headaches,  or have difficulty concentrating. The withdrawal symptoms are only temporary. They are strongest when you first quit, but they will go away within 10 14 days. To reduce the chances of relapse, try to:  Avoid drinking alcohol. Drinking lowers your chances of successfully quitting.  Reduce the amount of caffeine you consume. Once you quit smoking, the amount of caffeine in your body increases and can give you symptoms, such as a rapid heartbeat, sweating, and anxiety.  Avoid smokers because they can make you want to smoke.  Do not let weight gain distract you. Many smokers will gain weight when they quit, usually less than 10 pounds. Eat a healthy diet and stay active. You can always lose the weight gained after you quit.  Find ways to improve your mood other than smoking. FOR MORE INFORMATION  www.smokefree.gov  Document Released: 09/13/2001 Document Revised: 03/20/2012 Document Reviewed: 12/29/2011 [facility] Patient Information 2014 [place], [place].

## 2014-01-21 NOTE — Progress Notes (Signed)
Subjective:    Patient ID: Arthur Quince., male    DOB: [birth year], 73 y.o.   MRN: [id number]  HPI 73 yr old male is here today for a annual physical. He states his hernia has gotten worse, pain in both wrist - Left is worse than right and that is all for the day. He states he does need a refill on his Cialis and Levothyroxine . Prostate and thyroid cancers cured with surgery. Hypothyroid , ED, chronic joint pains.  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   HENT: Positive for hearing loss.   Eyes: Negative.   Respiratory: Negative.   Cardiovascular: Negative.   Gastrointestinal: Negative.   Endocrine: Negative.   Genitourinary: Positive for enuresis.  Musculoskeletal: Positive for arthralgias, back pain and myalgias.  Allergic/Immunologic: Negative.   Neurological: Positive for headaches.  Hematological: Negative.   Psychiatric/Behavioral: Negative.        Objective:   Physical Exam  Vitals reviewed. Constitutional: He is oriented to person, place, and time. He appears well-developed and well-nourished. No distress.  HENT:  Head: Normocephalic.  Right Ear: External ear normal.  Left Ear: External ear normal.  Nose: Nose normal.  Mouth/Throat: Oropharynx is clear and moist.  Neck: No tracheal deviation present. No thyromegaly present.  Cardiovascular: Normal rate, normal heart sounds and intact distal pulses.   Pulmonary/Chest: Effort normal and breath sounds normal.  Abdominal: Soft. Bowel sounds are normal. He exhibits no mass. There is no tenderness.  Genitourinary: Rectum normal and prostate normal. Penile tenderness present.  Musculoskeletal: He exhibits tenderness.  Lymphadenopathy:    He has no cervical adenopathy.  Neurological: He is alert and oriented to person, place, and time. No cranial nerve deficit. He exhibits normal muscle tone. Coordination normal.  Psychiatric: He has a normal mood and affect. His  behavior is normal. Judgment and thought content normal.     Results for orders placed in visit on 01/21/14  POCT CBC      Result Value Ref Range   WBC 8.6  4.6 - 10.2 K/uL   Lymph, poc 2.4  0.6 - 3.4   POC LYMPH PERCENT 28.1  10 - 50 %L   MID (cbc) 0.6  0 - 0.9   POC MID % 7.3  0 - 12 %M   POC Granulocyte 5.6  2 - 6.9   Granulocyte percent 64.6  37 - 80 %G   RBC 5.18  4.69 - 6.13 M/uL   Hemoglobin 15.6  14.1 - 18.1 g/dL   HCT, POC 48.6  43.5 - 53.7 %   MCV 93.8  80 - 97 fL   MCH, POC 30.1  27 - 31.2 pg   MCHC 32.1  31.8 - 35.4 g/dL   RDW, POC 13.1     Platelet Count, POC 258  142 - 424 K/uL   MPV 10.0  0 - 99.8 fL  POCT UA - MICROSCOPIC ONLY      Result Value Ref Range   WBC, Ur, HPF, POC yellow     RBC, urine, microscopic clear     Bacteria, U Microscopic neg     Mucus, UA neg     Epithelial cells, urine per micros neg     Crystals, Ur, HPF, POC neg     Casts, Ur, LPF, POC neg     Yeast, UA neg    POCT URINALYSIS DIPSTICK      Result Value Ref Range  Color, UA yellow     Clarity, UA clear     Glucose, UA neg     Bilirubin, UA       Ketones, UA neg     Spec Grav, UA 1.020     Blood, UA neg     pH, UA 7.0     Protein, UA neg     Urobilinogen, UA 0.2     Nitrite, UA neg     Leukocytes, UA Negative     EKG LBBB, 1st degree av block      Assessment & Plan:  RF all meds Schedule new primary 104 6 months See cardiology for review soon.

## 2014-01-22 LAB — PSA: PSA: 0.03 ng/mL (ref <=4.00)

## 2014-02-07 ENCOUNTER — Ambulatory Visit (INDEPENDENT_AMBULATORY_CARE_PROVIDER_SITE_OTHER): Payer: Medicare Other | Admitting: Internal Medicine

## 2014-02-07 VITALS — BP 136/72 | HR 67 | Temp 98.1°F | Resp 18 | Ht 72.0 in | Wt 196.0 lb

## 2014-02-07 DIAGNOSIS — E039 Hypothyroidism, unspecified: Secondary | ICD-10-CM | POA: Diagnosis not present

## 2014-02-07 DIAGNOSIS — Z5181 Encounter for therapeutic drug level monitoring: Secondary | ICD-10-CM | POA: Diagnosis not present

## 2014-02-07 LAB — TSH: TSH: 11.459 u[IU]/mL — AB (ref 0.350–4.500)

## 2014-02-07 NOTE — Patient Instructions (Signed)

## 2014-02-07 NOTE — Progress Notes (Signed)
   Subjective:    Patient ID: Arthur Quince., male    DOB: 02/09/1941, 73 y.o.   MRN: [id number]  HPI  73 y.o. Male presents to clinic today for a follow up concerning his thyroid. Had been out of thyroid med for about 10 days when last seen. TSH was 87.740 when last seen in April. Is taking levothyroxine 269mcg currently. Begging to feel better. Will check TSH for ome improvement. No syncope, chest pain. Agrees to see Dr. Angus Seller cardiology for LBBB.  Review of Systems     Objective:   Physical Exam  Constitutional: He is oriented to person, place, and time. He appears well-developed and well-nourished.  HENT:  Head: Normocephalic.  Eyes: EOM are normal.  Pulmonary/Chest: Effort normal and breath sounds normal.  Musculoskeletal: Normal range of motion. He exhibits tenderness.  Neurological: He is alert and oriented to person, place, and time. He exhibits normal muscle tone. Coordination normal.  Psychiatric: He has a normal mood and affect. His behavior is normal. Judgment and thought content normal.     TSH     Assessment & Plan:  Hypothyroid RTC 3 months repeat TSH

## 2014-02-10 ENCOUNTER — Telehealth: Payer: Self-pay | Admitting: Cardiovascular Disease

## 2014-02-10 ENCOUNTER — Encounter: Payer: Self-pay | Admitting: *Deleted

## 2014-02-10 NOTE — Telephone Encounter (Signed)
New message          Pt PCP is requesting that the pt see dr Johnsie Cancel as soon as possible. Dr Johnsie Cancel next available is 6/4. I did give pt an appt date but can you work him in sooner?

## 2014-02-11 NOTE — Telephone Encounter (Signed)
LMTCB ./CY 

## 2014-02-17 NOTE — Telephone Encounter (Signed)
Pt last seen by Dr Johnsie Cancel 01/07/2009.  The pt is now considered a new patient. The pt saw Dr Elder Cyphers in April and he wanted the pt seen by Cardiology again due to his abnormal EKG. Appointment scheduled on 02/19/14.

## 2014-02-17 NOTE — Telephone Encounter (Signed)
Follow Up:   Pt is requesting a call back from the nurse

## 2014-02-19 ENCOUNTER — Ambulatory Visit (INDEPENDENT_AMBULATORY_CARE_PROVIDER_SITE_OTHER)
Admission: RE | Admit: 2014-02-19 | Discharge: 2014-02-19 | Disposition: A | Discharge status: Home / Self Care | Payer: Medicare Other | Source: Ambulatory Visit | Attending: Cardiovascular Disease | Admitting: Cardiovascular Disease

## 2014-02-19 ENCOUNTER — Ambulatory Visit (INDEPENDENT_AMBULATORY_CARE_PROVIDER_SITE_OTHER): Payer: Medicare Other | Admitting: Cardiovascular Disease

## 2014-02-19 ENCOUNTER — Encounter: Payer: Self-pay | Admitting: Cardiovascular Disease

## 2014-02-19 VITALS — BP 156/80 | HR 63 | Wt 196.8 lb

## 2014-02-19 DIAGNOSIS — I428 Other cardiomyopathies: Secondary | ICD-10-CM

## 2014-02-19 DIAGNOSIS — J438 Other emphysema: Secondary | ICD-10-CM | POA: Diagnosis not present

## 2014-02-19 DIAGNOSIS — J449 Chronic obstructive pulmonary disease, unspecified: Secondary | ICD-10-CM

## 2014-02-19 DIAGNOSIS — I422 Other hypertrophic cardiomyopathy: Secondary | ICD-10-CM

## 2014-02-19 DIAGNOSIS — F17200 Nicotine dependence, unspecified, uncomplicated: Secondary | ICD-10-CM | POA: Diagnosis not present

## 2014-02-19 DIAGNOSIS — I10 Essential (primary) hypertension: Secondary | ICD-10-CM | POA: Diagnosis not present

## 2014-02-19 DIAGNOSIS — I447 Left bundle-branch block, unspecified: Secondary | ICD-10-CM

## 2014-02-19 NOTE — Assessment & Plan Note (Signed)
Will await results of echo but patient no longer compliant with meds " I felt ok so stopped them"  Likely add ACE/ARB back

## 2014-02-19 NOTE — Patient Instructions (Signed)
Your physician recommends that you schedule a follow-up appointment in: [place]  Your physician recommends that you continue on your current medications as directed. Please refer to the Current Medication list given to you today.  A chest x-ray takes a picture of the organs and structures inside the chest, including the heart, lungs, and blood vessels. This test can show several things, including, whether the heart is enlarges; whether fluid is building up in the lungs; and whether pacemaker / defibrillator leads are still in place. Your physician has requested that you have an echocardiogram. Echocardiography is a painless test that uses sound waves to create images of your heart. It provides your doctor with information about the size and shape of your heart and how well your heart's chambers and valves are working. This procedure takes approximately one hour. There are no restrictions for this procedure.

## 2014-02-19 NOTE — Assessment & Plan Note (Signed)
Stable no high grade AV block  F/U yearly echo Likely reflective of DCM

## 2014-02-19 NOTE — Assessment & Plan Note (Signed)
Clinically stable but needs echo to reassess EF  If low will resume beta blocker and ACE  Check BNP and diastolic parameters on echo to see if EDP high or not
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
## 2014-03-06 ENCOUNTER — Other Ambulatory Visit: Payer: Self-pay

## 2014-03-06 ENCOUNTER — Ambulatory Visit (HOSPITAL_COMMUNITY): Payer: Medicare Other | Attending: Cardiovascular Disease | Admitting: Radiology

## 2014-03-06 ENCOUNTER — Encounter: Payer: Medicare Other | Admitting: Cardiovascular Disease

## 2014-03-06 DIAGNOSIS — I422 Other hypertrophic cardiomyopathy: Secondary | ICD-10-CM | POA: Diagnosis not present

## 2014-03-06 DIAGNOSIS — J4489 Other specified chronic obstructive pulmonary disease: Secondary | ICD-10-CM | POA: Insufficient documentation

## 2014-03-06 DIAGNOSIS — F17200 Nicotine dependence, unspecified, uncomplicated: Secondary | ICD-10-CM | POA: Diagnosis not present

## 2014-03-06 DIAGNOSIS — R079 Chest pain, unspecified: Secondary | ICD-10-CM | POA: Insufficient documentation

## 2014-03-06 DIAGNOSIS — J449 Chronic obstructive pulmonary disease, unspecified: Secondary | ICD-10-CM | POA: Insufficient documentation

## 2014-03-06 DIAGNOSIS — I428 Other cardiomyopathies: Secondary | ICD-10-CM | POA: Diagnosis not present

## 2014-03-06 DIAGNOSIS — I10 Essential (primary) hypertension: Secondary | ICD-10-CM | POA: Diagnosis not present

## 2014-03-06 NOTE — Progress Notes (Signed)
Echocardiogram performed.  

## 2014-03-07 ENCOUNTER — Telehealth: Payer: Self-pay | Admitting: *Deleted

## 2014-03-07 MED ORDER — LOSARTAN POTASSIUM 50 MG PO TABS: 50.0000 mg | ORAL_TABLET | Freq: Every day | ORAL | Status: DC

## 2014-03-07 MED ORDER — CARVEDILOL 6.25 MG PO TABS: 6.2500 mg | ORAL_TABLET | Freq: Two times a day (BID) | ORAL | Status: DC

## 2014-03-07 NOTE — Telephone Encounter (Signed)
PT  NOTIFIED ./CY 

## 2014-03-07 NOTE — Telephone Encounter (Signed)
Message copied by Richmond Campbell on Fri Mar 07, 2014  5:08 PM ------      Message from: [name]      Created: Thu Mar 06, 2014 10:32 PM       EF still bad need to restart meds he stopped on his own  Start with cozaar 50 mg and coreg 6.25 bid  F/u with me next available ------

## 2014-04-24 DIAGNOSIS — K2100 Gastro-esophageal reflux disease with esophagitis, without bleeding: Secondary | ICD-10-CM | POA: Insufficient documentation

## 2014-04-25 ENCOUNTER — Ambulatory Visit (INDEPENDENT_AMBULATORY_CARE_PROVIDER_SITE_OTHER): Payer: Medicare Other | Admitting: Cardiovascular Disease

## 2014-04-25 ENCOUNTER — Encounter: Payer: Self-pay | Admitting: Cardiovascular Disease

## 2014-04-25 VITALS — BP 106/64 | HR 54 | Ht 72.0 in | Wt 197.1 lb

## 2014-04-25 DIAGNOSIS — G43909 Migraine, unspecified, not intractable, without status migrainosus: Secondary | ICD-10-CM | POA: Diagnosis not present

## 2014-04-25 DIAGNOSIS — I447 Left bundle-branch block, unspecified: Secondary | ICD-10-CM

## 2014-04-25 DIAGNOSIS — I10 Essential (primary) hypertension: Secondary | ICD-10-CM

## 2014-04-25 DIAGNOSIS — E039 Hypothyroidism, unspecified: Secondary | ICD-10-CM

## 2014-04-25 DIAGNOSIS — I428 Other cardiomyopathies: Secondary | ICD-10-CM

## 2014-04-25 NOTE — Patient Instructions (Signed)
Your physician wants you to follow-up in:  6 MONTHS WITH DR NISHAN  You will receive a reminder letter in the mail two months in advance. If you don't receive a letter, please call our office to schedule the follow-up appointment. Your physician recommends that you continue on your current medications as directed. Please refer to the Current Medication list given to you today. 

## 2014-04-25 NOTE — Assessment & Plan Note (Signed)
Well controlled.  Continue current medications and low sodium Dash type diet.    

## 2014-04-25 NOTE — Assessment & Plan Note (Signed)
Stable no high grade heart block  Yearly ECG
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
## 2014-04-25 NOTE — Assessment & Plan Note (Signed)
TSH improved to 11  On 200ug of synthroid  Consider increasing to 225 in a month

## 2014-04-25 NOTE — Assessment & Plan Note (Signed)
Stable Compliant with meds now No active CHF  Continue current regimen

## 2014-07-09 ENCOUNTER — Encounter: Payer: Self-pay | Admitting: Internal Medicine

## 2014-07-30 ENCOUNTER — Ambulatory Visit (INDEPENDENT_AMBULATORY_CARE_PROVIDER_SITE_OTHER): Payer: Medicare Other | Admitting: Emergency Medicine

## 2014-07-30 ENCOUNTER — Emergency Department (HOSPITAL_COMMUNITY)
Admission: EM | Admit: 2014-07-30 | Discharge: 2014-07-31 | Disposition: A | Discharge status: Home / Self Care | Payer: Medicare Other | Attending: Emergency Medicine | Admitting: Emergency Medicine

## 2014-07-30 ENCOUNTER — Emergency Department (HOSPITAL_COMMUNITY): Payer: Medicare Other

## 2014-07-30 ENCOUNTER — Ambulatory Visit (INDEPENDENT_AMBULATORY_CARE_PROVIDER_SITE_OTHER): Payer: Medicare Other

## 2014-07-30 ENCOUNTER — Encounter (HOSPITAL_COMMUNITY): Payer: Self-pay | Admitting: Emergency Medicine

## 2014-07-30 VITALS — BP 132/80 | HR 60 | Temp 98.4°F | Resp 17 | Ht 72.5 in | Wt 196.0 lb

## 2014-07-30 DIAGNOSIS — R1032 Left lower quadrant pain: Secondary | ICD-10-CM | POA: Insufficient documentation

## 2014-07-30 DIAGNOSIS — Z8719 Personal history of other diseases of the digestive system: Secondary | ICD-10-CM | POA: Diagnosis not present

## 2014-07-30 DIAGNOSIS — K5732 Diverticulitis of large intestine without perforation or abscess without bleeding: Secondary | ICD-10-CM | POA: Diagnosis not present

## 2014-07-30 DIAGNOSIS — Z8546 Personal history of malignant neoplasm of prostate: Secondary | ICD-10-CM | POA: Insufficient documentation

## 2014-07-30 DIAGNOSIS — I10 Essential (primary) hypertension: Secondary | ICD-10-CM | POA: Insufficient documentation

## 2014-07-30 DIAGNOSIS — Z720 Tobacco use: Secondary | ICD-10-CM | POA: Insufficient documentation

## 2014-07-30 DIAGNOSIS — M1990 Unspecified osteoarthritis, unspecified site: Secondary | ICD-10-CM | POA: Diagnosis not present

## 2014-07-30 DIAGNOSIS — N201 Calculus of ureter: Secondary | ICD-10-CM | POA: Diagnosis not present

## 2014-07-30 DIAGNOSIS — Z87448 Personal history of other diseases of urinary system: Secondary | ICD-10-CM | POA: Diagnosis not present

## 2014-07-30 DIAGNOSIS — Z9889 Other specified postprocedural states: Secondary | ICD-10-CM | POA: Insufficient documentation

## 2014-07-30 DIAGNOSIS — E039 Hypothyroidism, unspecified: Secondary | ICD-10-CM | POA: Insufficient documentation

## 2014-07-30 DIAGNOSIS — N200 Calculus of kidney: Secondary | ICD-10-CM | POA: Diagnosis not present

## 2014-07-30 DIAGNOSIS — R109 Unspecified abdominal pain: Secondary | ICD-10-CM

## 2014-07-30 DIAGNOSIS — J449 Chronic obstructive pulmonary disease, unspecified: Secondary | ICD-10-CM | POA: Insufficient documentation

## 2014-07-30 DIAGNOSIS — N132 Hydronephrosis with renal and ureteral calculous obstruction: Secondary | ICD-10-CM | POA: Diagnosis not present

## 2014-07-30 DIAGNOSIS — R1030 Lower abdominal pain, unspecified: Secondary | ICD-10-CM | POA: Diagnosis present

## 2014-07-30 LAB — CBC WITH DIFFERENTIAL/PLATELET
BASOS ABS: 0 10*3/uL (ref 0.0–0.1)
Basophils Relative: 0 % (ref 0–1)
Eosinophils Absolute: 0.1 10*3/uL (ref 0.0–0.7)
Eosinophils Relative: 1 % (ref 0–5)
HEMATOCRIT: 39.6 % (ref 39.0–52.0)
Hemoglobin: 13.4 g/dL (ref 13.0–17.0)
LYMPHS PCT: 15 % (ref 12–46)
Lymphs Abs: 1.9 10*3/uL (ref 0.7–4.0)
MCH: 30 pg (ref 26.0–34.0)
MCHC: 33.8 g/dL (ref 30.0–36.0)
MCV: 88.6 fL (ref 78.0–100.0)
MONO ABS: 1.3 10*3/uL — AB (ref 0.1–1.0)
Monocytes Relative: 10 % (ref 3–12)
NEUTROS ABS: 9.2 10*3/uL — AB (ref 1.7–7.7)
Neutrophils Relative %: 74 % (ref 43–77)
PLATELETS: 213 10*3/uL (ref 150–400)
RBC: 4.47 MIL/uL (ref 4.22–5.81)
RDW: 12.8 % (ref 11.5–15.5)
WBC: 12.5 10*3/uL — AB (ref 4.0–10.5)

## 2014-07-30 LAB — POCT URINALYSIS DIPSTICK
GLUCOSE UA: NEGATIVE
Ketones, UA: NEGATIVE
Leukocytes, UA: NEGATIVE
NITRITE UA: NEGATIVE
Protein, UA: NEGATIVE
Spec Grav, UA: >=1.03
UROBILINOGEN UA: 0.2
pH, UA: 5

## 2014-07-30 LAB — COMPREHENSIVE METABOLIC PANEL
ALT: 18 U/L (ref 0–53)
AST: 17 U/L (ref 0–37)
Albumin: 3.9 g/dL (ref 3.5–5.2)
Alkaline Phosphatase: 104 U/L (ref 39–117)
Anion gap: 11 (ref 5–15)
BUN: 22 mg/dL (ref 6–23)
CALCIUM: 10 mg/dL (ref 8.4–10.5)
CO2: 29 mEq/L (ref 19–32)
Chloride: 102 mEq/L (ref 96–112)
Creatinine, Ser: 1.24 mg/dL (ref 0.50–1.35)
GFR calc Af Amer: 65 mL/min — ABNORMAL LOW (ref >90)
GFR calc non Af Amer: 56 mL/min — ABNORMAL LOW (ref >90)
Glucose, Bld: 104 mg/dL — ABNORMAL HIGH (ref 70–99)
Potassium: 3.9 mEq/L (ref 3.7–5.3)
SODIUM: 142 meq/L (ref 137–147)
TOTAL PROTEIN: 7.4 g/dL (ref 6.0–8.3)
Total Bilirubin: 0.7 mg/dL (ref 0.3–1.2)

## 2014-07-30 LAB — POCT CBC
Granulocyte percent: 77.2 %G (ref 37–80)
HCT, POC: 43.9 % (ref 43.5–53.7)
HEMOGLOBIN: 14.1 g/dL (ref 14.1–18.1)
Lymph, poc: 2.2 (ref 0.6–3.4)
MCH, POC: 29.1 pg (ref 27–31.2)
MCHC: 32.1 g/dL (ref 31.8–35.4)
MCV: 90.5 fL (ref 80–97)
MID (cbc): 0.6 (ref 0–0.9)
MPV: 8.6 fL (ref 0–99.8)
PLATELET COUNT, POC: 216 10*3/uL (ref 142–424)
POC Granulocyte: 9.5 — AB (ref 2–6.9)
POC LYMPH PERCENT: 17.7 %L (ref 10–50)
POC MID %: 5.1 %M (ref 0–12)
RBC: 4.85 M/uL (ref 4.69–6.13)
RDW, POC: 13.3 %
WBC: 12.3 10*3/uL — AB (ref 4.6–10.2)

## 2014-07-30 LAB — LIPASE, BLOOD: Lipase: 29 U/L (ref 11–59)

## 2014-07-30 MED ORDER — ONDANSETRON HCL 4 MG/2ML IJ SOLN
4.0000 mg | Freq: Once | INTRAMUSCULAR | Status: AC
  Administered 2014-07-30: 4 mg via INTRAVENOUS
  Filled 2014-07-30: qty 2

## 2014-07-30 MED ORDER — METRONIDAZOLE 500 MG PO TABS: 500.0000 mg | ORAL_TABLET | Freq: Four times a day (QID) | ORAL | Status: DC

## 2014-07-30 MED ORDER — IOHEXOL 300 MG/ML  SOLN
25.0000 mL | INTRAMUSCULAR | Status: AC
  Administered 2014-07-30: 25 mL via ORAL

## 2014-07-30 MED ORDER — IOHEXOL 300 MG/ML  SOLN
100.0000 mL | Freq: Once | INTRAMUSCULAR | Status: AC | PRN
  Administered 2014-07-30: 100 mL via INTRAVENOUS

## 2014-07-30 MED ORDER — CIPROFLOXACIN HCL 500 MG PO TABS: 500.0000 mg | ORAL_TABLET | Freq: Two times a day (BID) | ORAL | Status: DC

## 2014-07-30 MED ORDER — MORPHINE SULFATE 4 MG/ML IJ SOLN
4.0000 mg | Freq: Once | INTRAMUSCULAR | Status: AC
  Administered 2014-07-30: 4 mg via INTRAVENOUS
  Filled 2014-07-30: qty 1

## 2014-07-30 MED ORDER — HYDROMORPHONE HCL 1 MG/ML IJ SOLN
1.0000 mg | Freq: Once | INTRAMUSCULAR | Status: AC
  Administered 2014-07-30: 1 mg via INTRAVENOUS
  Filled 2014-07-30: qty 1

## 2014-07-30 NOTE — ED Notes (Signed)
Spoke with lab.  Stated they never received lavendar tube.  Tube redrawn and sent down to lab.  Spoke with Gwen to notify her of new blood tube being drawn and sent.
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
## 2014-07-30 NOTE — Patient Instructions (Signed)

## 2014-07-30 NOTE — ED Notes (Signed)
Pt transported to CT ?

## 2014-07-30 NOTE — ED Notes (Signed)
Pt having abdominal pain, chills, nausea. Denies diarrhea. sts he cant void or have a BM. sts sent here by doctor for diverticulitis.
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
## 2014-07-31 DIAGNOSIS — R1032 Left lower quadrant pain: Secondary | ICD-10-CM | POA: Diagnosis not present

## 2014-07-31 LAB — URINALYSIS, ROUTINE W REFLEX MICROSCOPIC
BILIRUBIN URINE: NEGATIVE
Glucose, UA: NEGATIVE mg/dL
KETONES UR: NEGATIVE mg/dL
Leukocytes, UA: NEGATIVE
NITRITE: NEGATIVE
PH: 6 (ref 5.0–8.0)
Protein, ur: NEGATIVE mg/dL
Specific Gravity, Urine: 1.01 (ref 1.005–1.030)
Urobilinogen, UA: 1 mg/dL (ref 0.0–1.0)

## 2014-07-31 LAB — URINE MICROSCOPIC-ADD ON

## 2014-07-31 MED ORDER — ONDANSETRON 4 MG PO TBDP
4.0000 mg | ORAL_TABLET | Freq: Once | ORAL | Status: AC
  Administered 2014-07-31: 4 mg via ORAL
  Filled 2014-07-31: qty 1

## 2014-07-31 MED ORDER — OXYCODONE-ACETAMINOPHEN 5-325 MG PO TABS: 2.0000 | ORAL_TABLET | Freq: Four times a day (QID) | ORAL | Status: DC | PRN

## 2014-07-31 MED ORDER — HYDROMORPHONE HCL 1 MG/ML IJ SOLN
1.0000 mg | Freq: Once | INTRAMUSCULAR | Status: AC
  Administered 2014-07-31: 1 mg via INTRAVENOUS
  Filled 2014-07-31: qty 1

## 2014-07-31 NOTE — ED Provider Notes (Signed)
Medical screening examination/treatment/procedure(s) were conducted as a shared visit with non-physician practitioner(s) and myself.  I personally evaluated the patient during the encounter.   EKG Interpretation None      Pt with lower abd pain and concerns for diverticulitis vs appy.  guarding on exam.  CT showed kidney stone.  UA pending.  Pt non-toxic  Blanchie Dessert, MD 07/31/14 [phone]

## 2014-07-31 NOTE — Discharge Instructions (Signed)
You have been found to have a kidney stone.  Please follow-up with your urologist.  You also have an incidental chest nodule.  This needs to be evaluated with a CT scan of your chest on a non-emergent basis (in the next month).  Please return for worsening symptoms.  Kidney Stones Kidney stones (urolithiasis) are deposits that form inside your kidneys. The intense pain is caused by the stone moving through the urinary tract. When the stone moves, the ureter goes into spasm around the stone. The stone is usually passed in the urine.  CAUSES   A disorder that makes certain neck glands produce too much parathyroid hormone (primary hyperparathyroidism).  A buildup of uric acid crystals, similar to gout in your joints.  Narrowing (stricture) of the ureter.  A kidney obstruction present at birth (congenital obstruction).  Previous surgery on the kidney or ureters.  Numerous kidney infections. SYMPTOMS   Feeling sick to your stomach (nauseous).  Throwing up (vomiting).  Blood in the urine (hematuria).  Pain that usually spreads (radiates) to the groin.  Frequency or urgency of urination. DIAGNOSIS   Taking a history and physical exam.  Blood or urine tests.  CT scan.  Occasionally, an examination of the inside of the urinary bladder (cystoscopy) is performed. TREATMENT   Observation.  Increasing your fluid intake.  Extracorporeal shock wave lithotripsy--This is a noninvasive procedure that uses shock waves to break up kidney stones.  Surgery may be needed if you have severe pain or persistent obstruction. There are various surgical procedures. Most of the procedures are performed with the use of small instruments. Only small incisions are needed to accommodate these instruments, so recovery time is minimized. The size, location, and chemical composition are all important variables that will determine the proper choice of action for you. Talk to your health care provider to  better understand your situation so that you will minimize the risk of injury to yourself and your kidney.  HOME CARE INSTRUCTIONS   Drink enough water and fluids to keep your urine clear or pale yellow. This will help you to pass the stone or stone fragments.  Strain all urine through the provided strainer. Keep all particulate matter and stones for your health care provider to see. The stone causing the pain may be as small as a grain of salt. It is very important to use the strainer each and every time you pass your urine. The collection of your stone will allow your health care provider to analyze it and verify that a stone has actually passed. The stone analysis will often identify what you can do to reduce the incidence of recurrences.  Only take over-the-counter or prescription medicines for pain, discomfort, or fever as directed by your health care provider.  Make a follow-up appointment with your health care provider as directed.  Get follow-up X-rays if required. The absence of pain does not always mean that the stone has passed. It may have only stopped moving. If the urine remains completely obstructed, it can cause loss of kidney function or even complete destruction of the kidney. It is your responsibility to make sure X-rays and follow-ups are completed. Ultrasounds of the kidney can show blockages and the status of the kidney. Ultrasounds are not associated with any radiation and can be performed easily in a matter of minutes. SEEK MEDICAL CARE IF:  You experience pain that is progressive and unresponsive to any pain medicine you have been prescribed. SEEK IMMEDIATE MEDICAL CARE IF:  Pain cannot be controlled with the prescribed medicine.  You have a fever or shaking chills.  The severity or intensity of pain increases over 18 hours and is not relieved by pain medicine.  You develop a new onset of abdominal pain.  You feel faint or pass out.  You are unable to  urinate. MAKE SURE YOU:   Understand these instructions.  Will watch your condition.  Will get help right away if you are not doing well or get worse. Document Released: 09/19/2005 Document Revised: 05/22/2013 Document Reviewed: 02/20/2013 [facility] Patient Information 2015 [place], [place]. This information is not intended to replace advice given to you by your health care provider. Make sure you discuss any questions you have with your health care provider.

## 2014-07-31 NOTE — ED Notes (Signed)
Pt states that he is feeling better and that he is ready to leave. This RN escorted the pt to the front door.   Pt reports coming to the ER with a "brown leather jacket" that also contained his glasses. This RN did not see any jacket with the pt. Pt states that he left the jacket in triage. This RN searched the triage and waiting rooms and did not find a jacket. This RN also spoke with security, the triage team and radiology, and they also have not seen a jacket matching the description that the pt gives.

## 2014-07-31 NOTE — ED Notes (Signed)
On discharge, pt became nauseous when he was about to walk out of door.  Clear  Yellow bile noted to green emesis bag.  Spoke with Cydney Ok,  NP.  4 mg of Zofran ODT verbally ordered.  Will monitor pt to ensure nausea and vomiting has resolved.

## 2014-08-05 ENCOUNTER — Ambulatory Visit (INDEPENDENT_AMBULATORY_CARE_PROVIDER_SITE_OTHER): Payer: Medicare Other | Admitting: Internal Medicine

## 2014-08-05 ENCOUNTER — Encounter: Payer: Self-pay | Admitting: Internal Medicine

## 2014-08-05 VITALS — BP 154/72 | HR 60 | Temp 97.7°F | Resp 16 | Wt 194.8 lb

## 2014-08-05 DIAGNOSIS — E039 Hypothyroidism, unspecified: Secondary | ICD-10-CM

## 2014-08-05 DIAGNOSIS — N200 Calculus of kidney: Secondary | ICD-10-CM

## 2014-08-05 DIAGNOSIS — R911 Solitary pulmonary nodule: Secondary | ICD-10-CM

## 2014-08-05 DIAGNOSIS — R5383 Other fatigue: Secondary | ICD-10-CM | POA: Diagnosis not present

## 2014-08-05 DIAGNOSIS — R0781 Pleurodynia: Secondary | ICD-10-CM | POA: Diagnosis not present

## 2014-08-05 LAB — POCT URINALYSIS DIPSTICK
BILIRUBIN UA: NEGATIVE
Glucose, UA: NEGATIVE
KETONES UA: NEGATIVE
Leukocytes, UA: NEGATIVE
Nitrite, UA: NEGATIVE
Protein, UA: NEGATIVE
Spec Grav, UA: 1.02
UROBILINOGEN UA: 0.2
pH, UA: 6

## 2014-08-05 LAB — POCT UA - MICROSCOPIC ONLY
Bacteria, U Microscopic: NEGATIVE
CASTS, UR, LPF, POC: NEGATIVE
Crystals, Ur, HPF, POC: NEGATIVE
MUCUS UA: NEGATIVE
Yeast, UA: NEGATIVE

## 2014-08-05 LAB — TSH: TSH: 1.282 u[IU]/mL (ref 0.350–4.500)

## 2014-08-05 NOTE — Patient Instructions (Signed)
Hypothyroidism The thyroid is a large gland located in the lower front of your neck. The thyroid gland helps control metabolism. Metabolism is how your body handles food. It controls metabolism with the hormone thyroxine. When this gland is underactive (hypothyroid), it produces too little hormone.  CAUSES These include:   Absence or destruction of thyroid tissue.  Goiter due to iodine deficiency.  Goiter due to medications.  Congenital defects (since birth).  Problems with the pituitary. This causes a lack of TSH (thyroid stimulating hormone). This hormone tells the thyroid to turn out more hormone. SYMPTOMS  Lethargy (feeling as though you have no energy)  Cold intolerance  Weight gain (in spite of normal food intake)  Dry skin  Coarse hair  Menstrual irregularity (if severe, may lead to infertility)  Slowing of thought processes Cardiac problems are also caused by insufficient amounts of thyroid hormone. Hypothyroidism in the newborn is cretinism, and is an extreme form. It is important that this form be treated adequately and immediately or it will lead rapidly to retarded physical and mental development. DIAGNOSIS  To prove hypothyroidism, your caregiver may do blood tests and ultrasound tests. Sometimes the signs are hidden. It may be necessary for your caregiver to watch this illness with blood tests either before or after diagnosis and treatment. TREATMENT  Low levels of thyroid hormone are increased by using synthetic thyroid hormone. This is a safe, effective treatment. It usually takes about four weeks to gain the full effects of the medication. After you have the full effect of the medication, it will generally take another four weeks for problems to leave. Your caregiver may start you on low doses. If you have had heart problems the dose may be gradually increased. It is generally not an emergency to get rapidly to normal. HOME CARE INSTRUCTIONS   Take your  medications as your caregiver suggests. Let your caregiver know of any medications you are taking or start taking. Your caregiver will help you with dosage schedules.  As your condition improves, your dosage needs may increase. It will be necessary to have continuing blood tests as suggested by your caregiver.  Report all suspected medication side effects to your caregiver. SEEK MEDICAL CARE IF: Seek medical care if you develop:  Sweating.  Tremulousness (tremors).  Anxiety.  Rapid weight loss.  Heat intolerance.  Emotional swings.  Diarrhea.  Weakness. SEEK IMMEDIATE MEDICAL CARE IF:  You develop chest pain, an irregular heart beat (palpitations), or a rapid heart beat. MAKE SURE YOU:   Understand these instructions.  Will watch your condition.  Will get help right away if you are not doing well or get worse. Document Released: 09/19/2005 Document Revised: 12/12/2011 Document Reviewed: 05/09/2008 [facility] Patient Information 2015 [place], [place]. This information is not intended to replace advice given to you by your health care provider. Make sure you discuss any questions you have with your health care provider. Pulmonary Nodule A pulmonary nodule is a small, round growth of tissue in the lung. Pulmonary nodules can range in size from less than 1/5 inch (4 mm) to a little bigger than an inch (25 mm). Most pulmonary nodules are detected when imaging tests of the lung are being performed for a different problem. Pulmonary nodules are usually not cancerous (benign). However, some pulmonary nodules are cancerous (malignant). Follow-up treatment or testing is based on the size of the pulmonary nodule and your risk of getting lung cancer.  CAUSES Benign pulmonary nodules can be caused by various things.  Some of the causes include:   Bacterial, fungal, or viral infections. This is usually an old infection that is no longer active, but it can sometimes be a current, active  infection.  A benign mass of tissue.  Inflammation from conditions such as rheumatoid arthritis.   Abnormal blood vessels in the lungs. Malignant pulmonary nodules can result from lung cancer or from cancers that spread to the lung from other places in the body. SIGNS AND SYMPTOMS Pulmonary nodules usually do not cause symptoms. DIAGNOSIS Most often, pulmonary nodules are found incidentally when an X-ray or CT scan is performed to look for some other problem in the lung area. To help determine whether a pulmonary nodule is benign or malignant, your health care provider will take a medical history and order a variety of tests. Tests done may include:   Blood tests.  A skin test called a tuberculin test. This test is used to determine if you have been exposed to the germ that causes tuberculosis.   Chest X-rays. If possible, a new X-ray may be compared with X-rays you have had in the past.   CT scan. This test shows smaller pulmonary nodules more clearly than an X-ray.   Positron emission tomography (PET) scan. In this test, a safe amount of a radioactive substance is injected into the bloodstream. Then, the scan takes a picture of the pulmonary nodule. The radioactive substance is eliminated from your body in your urine.   Biopsy. A tiny piece of the pulmonary nodule is removed so it can be checked under a microscope. TREATMENT  Pulmonary nodules that are benign normally do not require any treatment because they usually do not cause symptoms or breathing problems. Your health care provider may want to monitor the pulmonary nodule through follow-up CT scans. The frequency of these CT scans will vary based on the size of the nodule and the risk factors for lung cancer. For example, CT scans will need to be done more frequently if the pulmonary nodule is larger and if you have a history of smoking and a family history of cancer. Further testing or biopsies may be done if any follow-up CT  scan shows that the size of the pulmonary nodule has increased. HOME CARE INSTRUCTIONS  Only take over-the-counter or prescription medicines as directed by your health care provider.  Keep all follow-up appointments with your health care provider. SEEK MEDICAL CARE IF:  You have trouble breathing when you are active.   You feel sick or unusually tired.   You do not feel like eating.   You lose weight without trying to.   You develop chills or night sweats.  SEEK IMMEDIATE MEDICAL CARE IF:  You cannot catch your breath, or you begin wheezing.   You cannot stop coughing.   You cough up blood.   You become dizzy or feel like you are going to pass out.   You have sudden chest pain.   You have a fever or persistent symptoms for more than 2-3 days.   You have a fever and your symptoms suddenly get worse. MAKE SURE YOU:  Understand these instructions.  Will watch your condition.  Will get help right away if you are not doing well or get worse. Document Released: 07/17/2009 Document Revised: 05/22/2013 Document Reviewed: 03/11/2013 [facility] Patient Information 2015 [place], [place]. This information is not intended to replace advice given to you by your health care provider. Make sure you discuss any questions you have with your health  care provider.  

## 2014-08-05 NOTE — Progress Notes (Deleted)
   Subjective:    Patient ID: Arthur M Biever Jr., male    DOB: 05/26/1941, 73 y.o.   MRN: [id number]  HPI    Review of Systems     Objective:   Physical Exam        Assessment & Plan:   

## 2014-08-05 NOTE — Progress Notes (Signed)
   Subjective:    Patient ID: Arthur Quince., male    DOB: [birth year], 73 y.o.   MRN: [id number]  HPI Patient is here for follow-up regarding hospital visit. He was diagnosed with kidney stones. He does not think he has passed any stones. He states he has had some dysuria, but denies hematuria.   While he was having imaging done they noticed a pulmonary nodule and her was told to follow-up with his PCP.    Patient is also complaining of left sided flank pain. He states last week when he was laying down and turned and felt something "snap". He has had increased pain since then.   He is also complaining of pleuritic chest pain, that started this week. He denies any new SOB, or associated symptoms.  It is painful to twist, move left lateral ribs. He is long time smoker, ct abdomen showed right lower pulmonary nodule. No cough or sob but has fatigue for few months.  His last TSH 11.5, 6 months before that it was 87.5  Review of Systems     Objective:   Physical Exam  Constitutional: He is oriented to person, place, and time. He appears well-developed and well-nourished. No distress.  HENT:  Head: Normocephalic.  Eyes: EOM are normal. Pupils are equal, round, and reactive to light. No scleral icterus.  Neck: Normal range of motion.  Cardiovascular: Normal rate, regular rhythm and normal heart sounds.   Pulmonary/Chest: Effort normal. No tachypnea. He has decreased breath sounds. He has no wheezes. He has no rhonchi. He has no rales.    Abdominal: Soft. Bowel sounds are normal. He exhibits no mass. There is no tenderness. There is no guarding.  Musculoskeletal: He exhibits tenderness.  Lymphadenopathy:    He has no cervical adenopathy.  Neurological: He is alert and oriented to person, place, and time. He exhibits normal muscle tone. Coordination normal.  Skin: No rash noted.  Psychiatric: He has a normal mood and affect. His behavior is normal. Judgment and thought content  normal.   Results for orders placed or performed in visit on 08/05/14  POCT UA - Microscopic Only  Result Value Ref Range   WBC, Ur, HPF, POC 1-3    RBC, urine, microscopic 1-2    Bacteria, U Microscopic neg    Mucus, UA neg    Epithelial cells, urine per micros 0-1    Crystals, Ur, HPF, POC neg    Casts, Ur, LPF, POC neg    Yeast, UA neg   POCT urinalysis dipstick  Result Value Ref Range   Color, UA yellow    Clarity, UA clear    Glucose, UA neg    Bilirubin, UA neg    Ketones, UA neg    Spec Grav, UA 1.020    Blood, UA trace-intact    pH, UA 6.0    Protein, UA neg    Urobilinogen, UA 0.2    Nitrite, UA neg    Leukocytes, UA Negative           Assessment & Plan:  Fatigue New pulmonary nodule and rib pain/Smoker will do CT chest Recent kidney stone appears resolved Hypothyroid/Hx of thyroidectomy and cancer

## 2014-08-06 ENCOUNTER — Encounter: Payer: Self-pay | Admitting: *Deleted

## 2014-08-12 ENCOUNTER — Ambulatory Visit
Admission: RE | Admit: 2014-08-12 | Discharge: 2014-08-12 | Disposition: A | Discharge status: Home / Self Care | Payer: Medicare Other | Source: Ambulatory Visit | Attending: Internal Medicine | Admitting: Internal Medicine

## 2014-08-12 DIAGNOSIS — R0781 Pleurodynia: Secondary | ICD-10-CM

## 2014-08-12 DIAGNOSIS — R918 Other nonspecific abnormal finding of lung field: Secondary | ICD-10-CM | POA: Diagnosis not present

## 2014-08-12 DIAGNOSIS — R911 Solitary pulmonary nodule: Secondary | ICD-10-CM

## 2014-08-12 DIAGNOSIS — J432 Centrilobular emphysema: Secondary | ICD-10-CM | POA: Diagnosis not present

## 2014-08-22 ENCOUNTER — Encounter: Payer: Self-pay | Admitting: Radiology

## 2014-09-18 ENCOUNTER — Ambulatory Visit (INDEPENDENT_AMBULATORY_CARE_PROVIDER_SITE_OTHER): Payer: Medicare Other | Admitting: *Deleted

## 2014-09-18 ENCOUNTER — Ambulatory Visit: Payer: Medicare Other | Admitting: *Deleted

## 2014-09-18 ENCOUNTER — Telehealth: Payer: Self-pay

## 2014-09-18 DIAGNOSIS — Z23 Encounter for immunization: Secondary | ICD-10-CM

## 2014-09-18 NOTE — Telephone Encounter (Signed)
Spoke to patient.  He will come in later today to have his shot.

## 2014-09-23 ENCOUNTER — Emergency Department (HOSPITAL_COMMUNITY)
Admission: RE | Admit: 2014-09-23 | Discharge: 2014-09-23 | Disposition: A | Discharge status: Home / Self Care | Payer: Medicare Other | Source: Ambulatory Visit | Attending: Emergency Medicine | Admitting: Emergency Medicine

## 2014-09-23 ENCOUNTER — Encounter (HOSPITAL_COMMUNITY): Payer: Self-pay | Admitting: Cardiology

## 2014-09-23 DIAGNOSIS — J449 Chronic obstructive pulmonary disease, unspecified: Secondary | ICD-10-CM | POA: Diagnosis not present

## 2014-09-23 DIAGNOSIS — I10 Essential (primary) hypertension: Secondary | ICD-10-CM | POA: Diagnosis not present

## 2014-09-23 DIAGNOSIS — N23 Unspecified renal colic: Secondary | ICD-10-CM | POA: Diagnosis not present

## 2014-09-23 DIAGNOSIS — N529 Male erectile dysfunction, unspecified: Secondary | ICD-10-CM | POA: Diagnosis not present

## 2014-09-23 DIAGNOSIS — Z8546 Personal history of malignant neoplasm of prostate: Secondary | ICD-10-CM | POA: Diagnosis not present

## 2014-09-23 DIAGNOSIS — Z79899 Other long term (current) drug therapy: Secondary | ICD-10-CM | POA: Diagnosis not present

## 2014-09-23 DIAGNOSIS — Z85850 Personal history of malignant neoplasm of thyroid: Secondary | ICD-10-CM | POA: Diagnosis not present

## 2014-09-23 DIAGNOSIS — Z720 Tobacco use: Secondary | ICD-10-CM | POA: Diagnosis not present

## 2014-09-23 DIAGNOSIS — G43909 Migraine, unspecified, not intractable, without status migrainosus: Secondary | ICD-10-CM | POA: Diagnosis not present

## 2014-09-23 DIAGNOSIS — E039 Hypothyroidism, unspecified: Secondary | ICD-10-CM | POA: Diagnosis not present

## 2014-09-23 DIAGNOSIS — R109 Unspecified abdominal pain: Secondary | ICD-10-CM | POA: Diagnosis present

## 2014-09-23 DIAGNOSIS — Z8719 Personal history of other diseases of the digestive system: Secondary | ICD-10-CM | POA: Diagnosis not present

## 2014-09-23 DIAGNOSIS — M1990 Unspecified osteoarthritis, unspecified site: Secondary | ICD-10-CM | POA: Diagnosis not present

## 2014-09-23 DIAGNOSIS — Z9889 Other specified postprocedural states: Secondary | ICD-10-CM | POA: Diagnosis not present

## 2014-09-23 LAB — CBC WITH DIFFERENTIAL/PLATELET
Basophils Absolute: 0 10*3/uL (ref 0.0–0.1)
Basophils Relative: 0 % (ref 0–1)
EOS PCT: 1 % (ref 0–5)
Eosinophils Absolute: 0.2 10*3/uL (ref 0.0–0.7)
HEMATOCRIT: 40.8 % (ref 39.0–52.0)
HEMOGLOBIN: 13.7 g/dL (ref 13.0–17.0)
LYMPHS ABS: 1.7 10*3/uL (ref 0.7–4.0)
LYMPHS PCT: 15 % (ref 12–46)
MCH: 29.6 pg (ref 26.0–34.0)
MCHC: 33.6 g/dL (ref 30.0–36.0)
MCV: 88.1 fL (ref 78.0–100.0)
MONO ABS: 0.7 10*3/uL (ref 0.1–1.0)
Monocytes Relative: 6 % (ref 3–12)
NEUTROS ABS: 8.7 10*3/uL — AB (ref 1.7–7.7)
Neutrophils Relative %: 78 % — ABNORMAL HIGH (ref 43–77)
Platelets: 227 10*3/uL (ref 150–400)
RBC: 4.63 MIL/uL (ref 4.22–5.81)
RDW: 12.8 % (ref 11.5–15.5)
WBC: 11.2 10*3/uL — AB (ref 4.0–10.5)

## 2014-09-23 LAB — URINALYSIS, ROUTINE W REFLEX MICROSCOPIC
Bilirubin Urine: NEGATIVE
Glucose, UA: NEGATIVE mg/dL
KETONES UR: NEGATIVE mg/dL
Leukocytes, UA: NEGATIVE
Nitrite: NEGATIVE
PROTEIN: NEGATIVE mg/dL
Specific Gravity, Urine: 1.017 (ref 1.005–1.030)
UROBILINOGEN UA: 0.2 mg/dL (ref 0.0–1.0)
pH: 7.5 (ref 5.0–8.0)

## 2014-09-23 LAB — COMPREHENSIVE METABOLIC PANEL
ALT: 24 U/L (ref 0–53)
AST: 25 U/L (ref 0–37)
Albumin: 4 g/dL (ref 3.5–5.2)
Alkaline Phosphatase: 89 U/L (ref 39–117)
Anion gap: 9 (ref 5–15)
BILIRUBIN TOTAL: 0.6 mg/dL (ref 0.3–1.2)
BUN: 16 mg/dL (ref 6–23)
CALCIUM: 10 mg/dL (ref 8.4–10.5)
CHLORIDE: 102 meq/L (ref 96–112)
CO2: 28 mmol/L (ref 19–32)
Creatinine, Ser: 0.89 mg/dL (ref 0.50–1.35)
GFR, EST NON AFRICAN AMERICAN: 83 mL/min — AB (ref >90)
GLUCOSE: 114 mg/dL — AB (ref 70–99)
Potassium: 3.7 mmol/L (ref 3.5–5.1)
Sodium: 139 mmol/L (ref 135–145)
Total Protein: 7 g/dL (ref 6.0–8.3)

## 2014-09-23 LAB — URINE MICROSCOPIC-ADD ON

## 2014-09-23 MED ORDER — ONDANSETRON 4 MG PO TBDP
8.0000 mg | ORAL_TABLET | Freq: Once | ORAL | Status: AC
  Administered 2014-09-23: 8 mg via ORAL
  Filled 2014-09-23: qty 2

## 2014-09-23 MED ORDER — OXYCODONE-ACETAMINOPHEN 5-325 MG PO TABS
1.0000 | ORAL_TABLET | Freq: Once | ORAL | Status: AC
Start: 2014-09-23 — End: 2014-09-23
  Administered 2014-09-23: 1 via ORAL
  Filled 2014-09-23: qty 1

## 2014-09-23 MED ORDER — OXYCODONE-ACETAMINOPHEN 5-325 MG PO TABS: 1.0000 | ORAL_TABLET | ORAL | Status: DC | PRN

## 2014-09-23 MED ORDER — SODIUM CHLORIDE 0.9 % IV SOLN
Freq: Once | INTRAVENOUS | Status: AC
  Administered 2014-09-23: 19:00:00 via INTRAVENOUS

## 2014-09-23 MED ORDER — KETOROLAC TROMETHAMINE 15 MG/ML IJ SOLN
15.0000 mg | Freq: Once | INTRAMUSCULAR | Status: AC
Start: 2014-09-23 — End: 2014-09-23
  Administered 2014-09-23: 15 mg via INTRAVENOUS
  Filled 2014-09-23: qty 1

## 2014-09-23 MED ORDER — ONDANSETRON HCL 4 MG/2ML IJ SOLN
4.0000 mg | Freq: Once | INTRAMUSCULAR | Status: AC
  Administered 2014-09-23: 4 mg via INTRAVENOUS
  Filled 2014-09-23: qty 2

## 2014-09-23 MED ORDER — TAMSULOSIN HCL 0.4 MG PO CAPS: 0.4000 mg | ORAL_CAPSULE | Freq: Every day | ORAL | Status: DC

## 2014-09-23 MED ORDER — HYDROMORPHONE HCL 1 MG/ML IJ SOLN
1.0000 mg | Freq: Once | INTRAMUSCULAR | Status: AC
  Administered 2014-09-23: 1 mg via INTRAVENOUS
  Filled 2014-09-23: qty 1

## 2014-09-23 MED ORDER — MORPHINE SULFATE 4 MG/ML IJ SOLN
4.0000 mg | Freq: Once | INTRAMUSCULAR | Status: AC
  Administered 2014-09-23: 4 mg via INTRAVENOUS
  Filled 2014-09-23: qty 1

## 2014-09-23 NOTE — ED Notes (Signed)
Pt reports that he was seen about a month ago for the left flank pain and told that he had a kidney stone. Reports that this afternoon the pain came back and having nausea

## 2014-09-23 NOTE — ED Notes (Signed)
Pt asked how he was getting home prior to medication administration and pt stated he was driving, pt advised that he could not drive after having had morphine and that we could call a cab for him, pt agreed, but stated that last time he was here he left about 2 minutes after getting "a morphine shot" and drove home and was fine. Pt advised that he could not do that and would not be allowed to drive after medication administration as it was a DUI. Pt verbalized he understood.

## 2014-09-23 NOTE — Discharge Instructions (Signed)
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
## 2014-09-23 NOTE — ED Notes (Signed)
Meds given at triage. Unable to chart

## 2014-09-24 ENCOUNTER — Inpatient Hospital Stay (HOSPITAL_COMMUNITY): Payer: Medicare Other | Admitting: Registered Nurse

## 2014-09-24 ENCOUNTER — Other Ambulatory Visit: Payer: Self-pay | Admitting: Urology

## 2014-09-24 ENCOUNTER — Encounter (HOSPITAL_COMMUNITY): Payer: Self-pay | Admitting: *Deleted

## 2014-09-24 ENCOUNTER — Ambulatory Visit (HOSPITAL_COMMUNITY)
Admission: RE | Admit: 2014-09-24 | Discharge: 2014-09-24 | Disposition: A | Discharge status: Home / Self Care | Payer: Medicare Other | Source: Ambulatory Visit | Attending: Urology | Admitting: Urology

## 2014-09-24 ENCOUNTER — Encounter (HOSPITAL_COMMUNITY)
Admission: RE | Disposition: A | Discharge status: Home / Self Care | Payer: Self-pay | Source: Ambulatory Visit | Attending: Urology

## 2014-09-24 DIAGNOSIS — Z79899 Other long term (current) drug therapy: Secondary | ICD-10-CM | POA: Insufficient documentation

## 2014-09-24 DIAGNOSIS — I10 Essential (primary) hypertension: Secondary | ICD-10-CM | POA: Diagnosis not present

## 2014-09-24 DIAGNOSIS — N529 Male erectile dysfunction, unspecified: Secondary | ICD-10-CM | POA: Insufficient documentation

## 2014-09-24 DIAGNOSIS — N135 Crossing vessel and stricture of ureter without hydronephrosis: Secondary | ICD-10-CM

## 2014-09-24 DIAGNOSIS — J449 Chronic obstructive pulmonary disease, unspecified: Secondary | ICD-10-CM | POA: Insufficient documentation

## 2014-09-24 DIAGNOSIS — N201 Calculus of ureter: Secondary | ICD-10-CM | POA: Diagnosis not present

## 2014-09-24 DIAGNOSIS — Z8719 Personal history of other diseases of the digestive system: Secondary | ICD-10-CM | POA: Insufficient documentation

## 2014-09-24 DIAGNOSIS — G43909 Migraine, unspecified, not intractable, without status migrainosus: Secondary | ICD-10-CM | POA: Insufficient documentation

## 2014-09-24 DIAGNOSIS — N200 Calculus of kidney: Secondary | ICD-10-CM

## 2014-09-24 DIAGNOSIS — M1990 Unspecified osteoarthritis, unspecified site: Secondary | ICD-10-CM | POA: Insufficient documentation

## 2014-09-24 DIAGNOSIS — N23 Unspecified renal colic: Secondary | ICD-10-CM | POA: Insufficient documentation

## 2014-09-24 DIAGNOSIS — E039 Hypothyroidism, unspecified: Secondary | ICD-10-CM | POA: Diagnosis not present

## 2014-09-24 DIAGNOSIS — Z8546 Personal history of malignant neoplasm of prostate: Secondary | ICD-10-CM | POA: Insufficient documentation

## 2014-09-24 DIAGNOSIS — Z9889 Other specified postprocedural states: Secondary | ICD-10-CM | POA: Insufficient documentation

## 2014-09-24 DIAGNOSIS — Z720 Tobacco use: Secondary | ICD-10-CM | POA: Insufficient documentation

## 2014-09-24 DIAGNOSIS — K219 Gastro-esophageal reflux disease without esophagitis: Secondary | ICD-10-CM | POA: Diagnosis not present

## 2014-09-24 DIAGNOSIS — Z85850 Personal history of malignant neoplasm of thyroid: Secondary | ICD-10-CM | POA: Insufficient documentation

## 2014-09-24 HISTORY — PX: HOLMIUM LASER APPLICATION: SHX5852

## 2014-09-24 HISTORY — PX: CYSTOSCOPY/RETROGRADE/URETEROSCOPY/STONE EXTRACTION WITH BASKET: SHX5317

## 2014-09-24 SURGERY — CYSTOSCOPY, WITH CALCULUS REMOVAL USING BASKET
Anesthesia: General | Site: Urethra | Laterality: Left

## 2014-09-24 MED ORDER — FENTANYL CITRATE 0.05 MG/ML IJ SOLN: 25.0000 ug | INTRAMUSCULAR | Status: DC | PRN

## 2014-09-24 MED ORDER — METOCLOPRAMIDE HCL 5 MG/ML IJ SOLN
INTRAMUSCULAR | Status: AC
  Filled 2014-09-24: qty 2

## 2014-09-24 MED ORDER — FENTANYL CITRATE 0.05 MG/ML IJ SOLN
INTRAMUSCULAR | Status: AC
  Filled 2014-09-24: qty 2

## 2014-09-24 MED ORDER — SODIUM CHLORIDE 0.9 % IR SOLN
Status: DC | PRN
  Administered 2014-09-24 (×3): 1000 mL
  Administered 2014-09-24: 3000 mL

## 2014-09-24 MED ORDER — ACETAMINOPHEN 650 MG RE SUPP
650.0000 mg | RECTAL | Status: DC | PRN
  Filled 2014-09-24: qty 1

## 2014-09-24 MED ORDER — DEXAMETHASONE SODIUM PHOSPHATE 10 MG/ML IJ SOLN
INTRAMUSCULAR | Status: AC
  Filled 2014-09-24: qty 1

## 2014-09-24 MED ORDER — ACETAMINOPHEN 325 MG PO TABS: 650.0000 mg | ORAL_TABLET | ORAL | Status: DC | PRN

## 2014-09-24 MED ORDER — ONDANSETRON HCL 4 MG/2ML IJ SOLN
INTRAMUSCULAR | Status: DC | PRN
  Administered 2014-09-24: 4 mg via INTRAVENOUS

## 2014-09-24 MED ORDER — METOCLOPRAMIDE HCL 5 MG/ML IJ SOLN
INTRAMUSCULAR | Status: DC | PRN
  Administered 2014-09-24: 10 mg via INTRAVENOUS

## 2014-09-24 MED ORDER — LACTATED RINGERS IV SOLN
INTRAVENOUS | Status: DC
  Administered 2014-09-24: 1000 mL via INTRAVENOUS

## 2014-09-24 MED ORDER — OXYCODONE HCL 5 MG PO TABS: 5.0000 mg | ORAL_TABLET | ORAL | Status: DC | PRN

## 2014-09-24 MED ORDER — LIDOCAINE HCL (CARDIAC) 20 MG/ML IV SOLN
INTRAVENOUS | Status: AC
  Filled 2014-09-24: qty 5

## 2014-09-24 MED ORDER — SODIUM CHLORIDE 0.9 % IV SOLN: 250.0000 mL | INTRAVENOUS | Status: DC | PRN

## 2014-09-24 MED ORDER — SODIUM CHLORIDE 0.9 % IJ SOLN: 3.0000 mL | INTRAMUSCULAR | Status: DC | PRN

## 2014-09-24 MED ORDER — PHENAZOPYRIDINE HCL 200 MG PO TABS: 200.0000 mg | ORAL_TABLET | Freq: Three times a day (TID) | ORAL | Status: DC | PRN

## 2014-09-24 MED ORDER — CIPROFLOXACIN IN D5W 400 MG/200ML IV SOLN
400.0000 mg | INTRAVENOUS | Status: AC
  Administered 2014-09-24: 400 mg via INTRAVENOUS

## 2014-09-24 MED ORDER — PROPOFOL 10 MG/ML IV BOLUS
INTRAVENOUS | Status: DC | PRN
  Administered 2014-09-24: 180 mg via INTRAVENOUS

## 2014-09-24 MED ORDER — CARVEDILOL 6.25 MG PO TABS
6.2500 mg | ORAL_TABLET | ORAL | Status: AC
  Administered 2014-09-24: 6.25 mg via ORAL
  Filled 2014-09-24: qty 1

## 2014-09-24 MED ORDER — ONDANSETRON HCL 4 MG/2ML IJ SOLN
INTRAMUSCULAR | Status: AC
  Filled 2014-09-24: qty 2

## 2014-09-24 MED ORDER — FENTANYL CITRATE 0.05 MG/ML IJ SOLN
25.0000 ug | INTRAMUSCULAR | Status: DC | PRN
Start: 2014-09-24 — End: 2014-09-24

## 2014-09-24 MED ORDER — DEXAMETHASONE SODIUM PHOSPHATE 10 MG/ML IJ SOLN
INTRAMUSCULAR | Status: DC | PRN
  Administered 2014-09-24: 10 mg via INTRAVENOUS

## 2014-09-24 MED ORDER — LACTATED RINGERS IV SOLN: INTRAVENOUS | Status: DC

## 2014-09-24 MED ORDER — FENTANYL CITRATE 0.05 MG/ML IJ SOLN
INTRAMUSCULAR | Status: DC | PRN
  Administered 2014-09-24: 50 ug via INTRAVENOUS
  Administered 2014-09-24: 25 ug via INTRAVENOUS
  Administered 2014-09-24: 50 ug via INTRAVENOUS
  Administered 2014-09-24: 25 ug via INTRAVENOUS
  Administered 2014-09-24: 50 ug via INTRAVENOUS

## 2014-09-24 MED ORDER — CIPROFLOXACIN IN D5W 400 MG/200ML IV SOLN
INTRAVENOUS | Status: AC
  Filled 2014-09-24: qty 200

## 2014-09-24 MED ORDER — SODIUM CHLORIDE 0.9 % IJ SOLN: 3.0000 mL | Freq: Two times a day (BID) | INTRAMUSCULAR | Status: DC

## 2014-09-24 MED ORDER — LIDOCAINE HCL (CARDIAC) 20 MG/ML IV SOLN
INTRAVENOUS | Status: DC | PRN
  Administered 2014-09-24: 100 mg via INTRAVENOUS

## 2014-09-24 MED ORDER — PROPOFOL 10 MG/ML IV BOLUS
INTRAVENOUS | Status: AC
  Filled 2014-09-24: qty 20

## 2014-09-24 MED ORDER — DIATRIZOATE MEGLUMINE 30 % UR SOLN
URETHRAL | Status: DC | PRN
  Administered 2014-09-24: 6 mL via URETHRAL

## 2014-09-24 MED ORDER — EPHEDRINE SULFATE 50 MG/ML IJ SOLN
INTRAMUSCULAR | Status: DC | PRN
  Administered 2014-09-24 (×2): 10 mg via INTRAVENOUS
  Administered 2014-09-24: 5 mg via INTRAVENOUS
  Administered 2014-09-24: 10 mg via INTRAVENOUS

## 2014-09-24 SURGICAL SUPPLY — 22 items
BAG URO CATCHER STRL LF (DRAPE) ×3 IMPLANT
BASKET ZERO TIP NITINOL 2.4FR (BASKET) ×3 IMPLANT
CATH URET 5FR 28IN OPEN ENDED (CATHETERS) ×3 IMPLANT
CLOTH BEACON ORANGE TIMEOUT ST (SAFETY) ×3 IMPLANT
DRAPE CAMERA CLOSED 9X96 (DRAPES) ×3 IMPLANT
EXTRACTOR STONE NITINOL NGAGE (UROLOGICAL SUPPLIES) ×3 IMPLANT
FIBER LASER FLEXIVA 1000 (UROLOGICAL SUPPLIES) IMPLANT
FIBER LASER FLEXIVA 200 (UROLOGICAL SUPPLIES) ×3 IMPLANT
FIBER LASER FLEXIVA 365 (UROLOGICAL SUPPLIES) IMPLANT
FIBER LASER FLEXIVA 550 (UROLOGICAL SUPPLIES) IMPLANT
FIBER LASER TRAC TIP (UROLOGICAL SUPPLIES) ×3 IMPLANT
GLOVE SURG SS PI 8.0 STRL IVOR (GLOVE) ×3 IMPLANT
GOWN STRL REUS W/TWL XL LVL3 (GOWN DISPOSABLE) ×3 IMPLANT
GUIDEWIRE STR DUAL SENSOR (WIRE) ×3 IMPLANT
KIT BALLIN UROMAX 15FX10 (LABEL) ×1 IMPLANT
MANIFOLD NEPTUNE II (INSTRUMENTS) ×3 IMPLANT
PACK CYSTO (CUSTOM PROCEDURE TRAY) ×3 IMPLANT
SET HIGH PRES BAL DIL (LABEL) ×2
SHEATH ACCESS URETERAL 38CM (SHEATH) ×3 IMPLANT
STENT CONTOUR 6FRX26X.038 (STENTS) ×3 IMPLANT
TUBING CONNECTING 10 (TUBING) ×2 IMPLANT
TUBING CONNECTING 10' (TUBING) ×1

## 2014-09-24 NOTE — Anesthesia Preprocedure Evaluation (Addendum)
Anesthesia Evaluation  Patient identified by MRN, date of birth, ID band Patient awake    Reviewed: Allergy & Precautions, H&P , NPO status , Patient's Chart, lab work & pertinent test results, reviewed documented beta blocker date and time   Airway Mallampati: II  TM Distance: >3 FB Neck ROM: full    Dental  (+) Dental Advidsory Given, Edentulous Upper, Edentulous Lower   Pulmonary COPDCurrent Smoker,  breath sounds clear to auscultation  Pulmonary exam normal       Cardiovascular hypertension, Pt. on home beta blockers and Pt. on medications + dysrhythmias (Left BBB) Rhythm:regular Rate:Normal  LBBB. Non-ischemic cardiomyopathy   Neuro/Psych  Headaches, negative neurological ROS  negative psych ROS   GI/Hepatic negative GI ROS, Neg liver ROS, GERD-  Controlled,  Endo/Other  negative endocrine ROSHypothyroidism   Renal/GU negative Renal ROS  negative genitourinary   Musculoskeletal   Abdominal   Peds  Hematology negative hematology ROS (+)   Anesthesia Other Findings   Reproductive/Obstetrics negative OB ROS                            Anesthesia Physical Anesthesia Plan  ASA: III  Anesthesia Plan: General   Post-op Pain Management:    Induction: Intravenous  Airway Management Planned: LMA  Additional Equipment:   Intra-op Plan:   Post-operative Plan:   Informed Consent: I have reviewed the patients History and Physical, chart, labs and discussed the procedure including the risks, benefits and alternatives for the proposed anesthesia with the patient or authorized representative who has indicated his/her understanding and acceptance.   Dental Advisory Given  Plan Discussed with: CRNA and Surgeon  Anesthesia Plan Comments:         Anesthesia Quick Evaluation

## 2014-09-24 NOTE — Discharge Instructions (Addendum)
Ureteral Stent Implantation Ureteral stent implantation is the implantation of a soft plastic tube with multiple holes into the tube that drains urine from your kidney to your bladder (ureter). The stent helps drain your kidney when there is a blockage of the flow of urine in your ureter. The stent has a coil on each end to keep it from falling out. One end stays in the kidney. The other end stays in the bladder. It is most often taken out after any blockage has been removed or your ureter has healed. Short-term stents have a string attached to make removal quite easy. Removal of a short-term stent can be done in your health care provider's office or by you at home. Long-term stents need to be changed every few months. LET [name] CARE PROVIDER KNOW ABOUT:  Any allergies you have.  All medicines you are taking, including vitamins, herbs, eye drops, creams, and over-the-counter medicines.  Previous problems you or members of your family have had with the use of anesthetics.  Any blood disorders you have.  Previous surgeries you have had.  Medical conditions you have. RISKS AND COMPLICATIONS Generally, ureteral stent implantation is a safe procedure. However, as with any procedure, complications can occur. Possible complications include:  Movement of the stent away from where it was originally placed (migration). This may affect the ability of the stent to properly drain your kidney. If migration of the stent occurs, the stent may need to be replaced or repositioned.  Perforation of the ureter.  Infection. BEFORE THE PROCEDURE  You may be asked to wash your genital area with sterile soap the morning of your procedure.  You may be given an oral antibiotic which you should take with a sip of water as prescribed by your health care provider.  You may be asked to not eat or drink for 8 hours before the surgery. PROCEDURE  First you will be given an anesthetic so you do not feel pain  during the procedure.  Your health care provider will insert a special lighted instrument called a cystoscope into your bladder. This allows your health care provider to see the opening to your ureter.  A thin wire is carefully threaded into your bladder and up the ureter. The stent is inserted over the wire and the wire is then removed.  Your bladder will be emptied of urine. AFTER THE PROCEDURE You will be taken to a recovery room until it is okay for you to go home. Document Released: 09/16/2000 Document Revised: 09/24/2013 Document Reviewed: 02/26/2013 [facility] Patient Information 2015 [place], [place]. This information is not intended to replace advice given to you by your health care provider. Make sure you discuss any questions you have with your health care provider.  You had a stricture (scarring with narrowing) below the stone that I had to dilate.  Because of that I felt it was best to treat the kidney stones too because they would never pass.   I have placed a stent as noted above.  That will need to stay in place for about 2 weeks and then we will need to get you to the office to have an x ray and remove the stent.

## 2014-09-24 NOTE — H&P (Signed)
Active Problems Problems  1. Calculus of left ureter (N20.1) 2. Erectile dysfunction due to arterial insufficiency (N52.01) 3. Male stress incontinence (N39.3) 4. Personal history of prostate cancer (Z85.46) 5. Renal calculus, left (N20.0) 6. Spermatocele (N43.40)  History of Present Illness [name] returns today with a new complaint of left flank pain that was severe. The current episode began yesterday and was associated with nausea and vomiting. He was sent from the ER for a possible stone. He had a CT in October for left flank pain and was found to have a 46mm left mid ureteral stone then and a larger left renal stone.  He may have passed a stone remotely.  He has a history of prostate cancer with a prostatectomy in 2001 and undetectible PSA's. He has stable SUI.   Past Medical History Problems  1. History of cardiac disorder (Z86.79) 2. History of hypercholesterolemia (Z86.39) 3. History of hypertension (Z86.79) 4. History of migraine headaches (Z86.69) 5. Personal history of prostate cancer (Z85.46) 6. History of Thyroid Cancer  Surgical History Problems  1. History of Arthroscopy Knee 2. History of Foot Surgery 3. History of Inguinal Hernia Repair 4. History of Knee Replacement 5. History of Knee Surgery 6. History of Prostatect Retropubic Radical W/ Nerve Sparing Laparoscopic 7. History of Sinus Surgery 8. History of Thyroid Surgery Total Thyroidectomy  Current Meds 1. Anaprox TABS;  Therapy: (Recorded:23Dec2015) to Recorded 2. Cialis 5 MG Oral Tablet; take 1 tablet by mouth once daily;  Therapy: [id number] to (Evaluate:16Mar2015)  Requested for: 51VOH6073; Last  Rx:19Feb2014 Ordered 3. Cozaar 50 MG Oral Tablet;  Therapy: (Recorded:23Dec2015) to Recorded 4. Levothyroxine Sodium TABS;  Therapy: (Recorded:19Feb2014) to Recorded 5. Maxalt-MLT 10 MG Oral Tablet Dispersible;  Therapy: (Recorded:23Dec2015) to Recorded 6. Pantoprazole Sodium 40 MG Oral Tablet Delayed  Release;  Therapy: [id number] to Recorded 7. Ranitidine HCl - 150 MG Oral Tablet;  Therapy: (Recorded:18Feb2013) to Recorded 8. Rizatriptan Benzoate 10 MG Oral Tablet;  Therapy: 01Aug2013 to Recorded  Allergies Medication  1. Doxycycline Hyclate CAPS  Family History Problems  1. Family history of Colon Cancer : Brother 2. Family history of Death In The Family Father   age 17 of heart attack 3. Family history of Death In The Family Mother   age 44 4. Family history of Diabetes Mellitus : Brother 5. Family history of Family Health Status Number Of Children   1 son  Social History Problems  1. Alcohol Use   occasional beer 2. Caffeine Use   3 a day 3. Former smoker [phone]) 4. Marital History - Widowed 5. Occupation:   retired Development worker, community 6. Tobacco Use   1/2 -1 ppd x 40 years but he quit in 11/13  Review of Systems Genitourinary, constitutional, skin, eye, otolaryngeal, hematologic/lymphatic, cardiovascular, pulmonary, endocrine, musculoskeletal, gastrointestinal, neurological and psychiatric system(s) were reviewed and pertinent findings if present are noted and are otherwise negative.  Genitourinary: no hematuria.  Gastrointestinal: nausea, vomiting and flank pain.  Constitutional: fever (99.9 yesterday).  Cardiovascular: no chest pain.  Respiratory: shortness of breath.    Vitals Vital Signs [Data Includes: Last 1 Day]  Recorded: 23Dec2015 11:05AM  Height: 6 ft 4 in Weight: 200 lb  BMI Calculated: 24.34 BSA Calculated: 2.22 Blood Pressure: 161 / 83 Temperature: 98 F Heart Rate: 66  Physical Exam Constitutional: Well nourished and well developed . No acute distress.  Pulmonary: No respiratory distress and normal respiratory rhythm and effort.  Cardiovascular: Heart rate and rhythm are normal . No peripheral edema.  Results/Data Urine [Data Includes: Last 1 Day]   [id number]  COLOR YELLOW   APPEARANCE CLEAR   SPECIFIC GRAVITY 1.030    pH 5.5   GLUCOSE NEG mg/dL  BILIRUBIN NEG   KETONE NEG mg/dL  BLOOD LARGE   PROTEIN NEG mg/dL  UROBILINOGEN 0.2 mg/dL  NITRITE NEG   LEUKOCYTE ESTERASE NEG   SQUAMOUS EPITHELIAL/HPF RARE   WBC NONE SEEN WBC/hpf  RBC 11-20 RBC/hpf  BACTERIA RARE   CRYSTALS NONE SEEN   CASTS NONE SEEN    ER notes reviewed.  The following images/tracing/specimen were independently visualized:  KUB today shows 2 8-39m LLP stones and a 3 x 5 mm left distal stone. He has multiple left pelvic phleboliths and clips in the pelvis but no other abnormalities are noted with normal gas and soft tissue shadows. CT films and report reviewed.  The following clinical lab reports were reviewed:  UA reviewed.    Assessment Assessed  1. Calculus of left ureter (N20.1) 2. Renal calculus, left (N20.0)  He has a left distal stone with persistent symptoms.   Plan Calculus of left ureter  1. KUB; Status:Resulted - Requires Verification;   Done:: [id number]:00AM Health Maintenance  2. UA With REFLEX; [Do Not Release]; Status:Resulted - Requires Verification;   Done::  [id number]:53AM  I discussed MET, ureteroscopy and ESWL and we are going to proceed with ureteroscopy today. I reivewed the risks of bleeding, infection, ureteral injury, need for a stent or secondary procedures, thrombotic events or anesthetic complications.   Discussion/Summary CC: Dr. CLou Miner   Signatures

## 2014-09-24 NOTE — Anesthesia Postprocedure Evaluation (Signed)
  Anesthesia Post-op Note  Patient: Arthur [name].  Procedure(s) Performed: Procedure(s) (LRB): CYSTOSCOPY/RETROGRADE/URETEROSCOPY/STONE EXTRACTION WITH BASKET FROM URETER AND KIDNEY/STENT PLACEMENT (Left) HOLMIUM LASER APPLICATION (Left)  Patient Location: PACU  Anesthesia Type: General  Level of Consciousness: awake and alert   Airway and Oxygen Therapy: Patient Spontanous Breathing  Post-op Pain: mild  Post-op Assessment: Post-op Vital signs reviewed, Patient's Cardiovascular Status Stable, Respiratory Function Stable, Patent Airway and No signs of Nausea or vomiting  Last Vitals:  Filed Vitals:   09/24/14 1900  BP: 165/89  Pulse: 65  Temp:   Resp: 18    Post-op Vital Signs: stable   Complications: No apparent anesthesia complications

## 2014-09-24 NOTE — Brief Op Note (Signed)
09/24/2014  6:12 PM  PATIENT:  Arthur [name].  73 y.o. male  PRE-OPERATIVE DIAGNOSIS:  Left distal ureteral stone and Left lower pole renal stones.  POST-OPERATIVE DIAGNOSIS:  Same with left ureteral stricture.  PROCEDURE:  Procedure(s): CYSTOSCOPY/RETROGRADE/URETEROSCOPY/STONE EXTRACTION WITH BASKET FROM URETER AND KIDNEY/STENT PLACEMENT (Left) HOLMIUM LASER APPLICATION (Left)  SURGEON:  Surgeon(s) and Role:    * [name], MD - Primary  PHYSICIAN ASSISTANT:   ASSISTANTS: none   ANESTHESIA:   general  EBL:     BLOOD ADMINISTERED:none  DRAINS: 6 x 26cm left JJ stent   LOCAL MEDICATIONS USED:  NONE  SPECIMEN:  Source of Specimen:  stone fragments  DISPOSITION OF SPECIMEN:  to patient to bring to office  COUNTS:  YES  TOURNIQUET:  * No tourniquets in log *  DICTATION: .Other Dictation: Dictation Number [id number]  PLAN OF CARE: Discharge to home after PACU  PATIENT DISPOSITION:  PACU - hemodynamically stable.   Delay start of Pharmacological VTE agent (>24hrs) due to surgical blood loss or risk of bleeding: not applicable

## 2014-09-24 NOTE — Transfer of Care (Signed)
Immediate Anesthesia Transfer of Care Note  Patient: Brant Peets.  Procedure(s) Performed: Procedure(s) (LRB): CYSTOSCOPY/RETROGRADE/URETEROSCOPY/STONE EXTRACTION WITH BASKET FROM URETER AND KIDNEY/STENT PLACEMENT (Left) HOLMIUM LASER APPLICATION (Left)  Patient Location: PACU  Anesthesia Type: General  Level of Consciousness: sedated, patient cooperative and responds to stimulation  Airway & Oxygen Therapy: Patient Spontanous Breathing and Patient connected to face mask oxgen  Post-op Assessment: Report given to PACU RN and Post -op Vital signs reviewed and stable  Post vital signs: Reviewed and stable  Complications: No apparent anesthesia complications

## 2014-09-25 ENCOUNTER — Encounter (HOSPITAL_COMMUNITY): Payer: Self-pay | Admitting: Urology

## 2014-09-25 NOTE — Op Note (Signed)
NAMEGARIK, Arthur [name]  MEDICAL RECORD NO.:  [id number]  LOCATION:  [facility]                         FACILITY:  [facility]  PHYSICIAN:  Marshall Cork. Jeffie Pollock, M.D.    DATE OF BIRTH:  Arthur [name], Arthur [name]  DATE OF PROCEDURE:  09/24/2014 DATE OF DISCHARGE:  09/24/2014                              OPERATIVE REPORT   PROCEDURE: 1. Cystoscopy with left retrograde pyelogram. 2. Dilation of left ureteral stricture. 3. Left ureteroscopic stone extraction. 4. Left ureterorenoscopy with holmium laser lithotripsy and stone     extraction. 5. Placement of left double-J stent.  PREOPERATIVE DIAGNOSIS:  Symptomatic left distal ureteral stone.  POSTOPERATIVE DIAGNOSIS:  Symptomatic left distal ureteral stone with left distal ureteral stricture and left lower pole renal calculi.  SURGEON:  Marshall Cork. Jeffie Pollock, M.D.  ANESTHESIA:  General.  SPECIMEN:  Stone fragments.  DRAINS:  A 6-French, 26-cm double-J stent.  BLOOD LOSS:  None.  COMPLICATIONS:  None.  INDICATIONS:  Arthur [name] is a 73 year old white male with a history of prostate cancer with a prior prostatectomy in 2001, who presented a couple of months ago to the emergency room with left flank pain and he was found to have a 4 mm left mid ureteral stone.  He also had 8-9 mm left renal stones.  He returned to the office today with recurrent left flank pain and a KUB revealed the ureteral stone was now in the distal ureter.  The renal stones remained in the lower pole.  It was felt that ureteroscopic extraction of the left distal stones were indicated because of the patient's symptoms.  FINDINGS OF PROCEDURE:  He was taken to the operating room, where he was given Cipro.  A general anesthetic was induced.  He was placed in lithotomy position and fitted with PAS hose.  His perineum and genitalia were prepped with Betadine solution.  He was draped in usual sterile fashion.  Cystoscopy was performed using a 22-French scope and  12-degree lens. Examination revealed a normal urethra.  The external sphincter was intact.  The prostate was absent and there was no evidence of bladder neck contraction.  The bladder wall was smooth and pale without tumor, stones, or inflammation.  The ureteral orifices were in the normal anatomic position, but both were quite delicate.  A 5-French open-end catheter was initially attempted to be passed in the left ureteral orifice.  However, it was too tight, so a Sensor wire was advanced through the open-end catheter and the 5-French catheter was then advanced over the wire into the distal ureter.  A left retrograde pyelogram was performed with Omnipaque.  This revealed narrowing of the left distal ureter to the level of the stone approximately 5 cm proximal to the meatus.  There was dilation proximal to that.  At this point, a guidewire was passed by the stone into the kidney and the cystoscope was removed.  An attempt was made to pass the 12-French introducer sheath inner core over the wire to dilate the distal ureter. However, it would not progress to the level of the stone.  At this point, I replaced the cystoscope over the wire and inserted a 10 cm  15-French balloon dilation catheter.  The catheter was then dilated with dilute contrast to 16 atmospheres with no evidence of residual waist.  At this point, an attempt was made to pass the 6.4-French semi-rigid ureteroscope.  However, there was fixation of the distal ureter, likely related to his prior prostatectomy and I could not get it around the bend of the stone.  At this point, the cystoscope was removed and a 38 cm 12 x 14 digital access sheath was reasonable and inserted over the wire to the level of the stone.  The inner core and wire were removed and the digital flexible ureteroscope was then passed through the sheath.  The stone was then visualized in the distal ureter and removed with a Nitinol basket without  difficulty.  At this point, I felt I should take care of the stones in the left lower pole because of the presence of the stricture should he ever have those stones going to the ureter, it would be very problematic getting them out.  So, the wire was replaced along with the access sheath inner core and the access sheath was advanced to the proximal ureter.  The digital flexible scope was then inserted and the 2 stones in the kidney were identified, 1 in the lower pole and 1 slightly more mid lateral.  The initial lower pole stone was engaged with a 200 micron laser fiber.  Attractive fiber was used at 1 watt and 10 hertz.  The stone was then broken into manageable fragments which were then removed using an NGage basket.  I then turned my attention to the stone in the mid portion of the kidney.  This was also broken into manageable fragments and removed with the NGage basket.  A large residual fragment was actually moved into the proximal ureter and fragmented in this area and successfully removed. Also during the procedure, a residual piece of Nitinol was identified at least that is what it appeared to be and this was removed using the basket.  Final inspection revealed no residual stones.  On fluoroscopy, there appeared to be possibly some dust, but on my visual inspection I was not able to identify any significant residual fragments.  At this point, the ureteroscope was removed.  Then, the guidewire was reinserted through the sheath.  The sheath was removed.  The cystoscope was inserted over the wire and a 6-French 26-cm double-J stent was passed to the kidney under fluoroscopic guidance.  The wire was removed, leaving a good coil in the kidney and a good coil in the bladder.  The bladder was drained.  The cystoscope was removed.  The patient was taken down from lithotomy position.  His anesthetic was reversed.  He was moved to the recovery in stable condition.  He will need to  return to our office in 2 weeks for stent removal and a KUB to ensure that no significant residual fragments remained.  The stone fragments were sent with the patient to bring to our office for analysis.     Marshall Cork. Jeffie Pollock, M.D.     JJW/MEDQ  D:  09/24/2014  T:  09/25/2014  Job:  [id number]

## 2014-09-29 ENCOUNTER — Encounter (HOSPITAL_COMMUNITY): Payer: Self-pay | Admitting: Urology

## 2014-10-07 DIAGNOSIS — N201 Calculus of ureter: Secondary | ICD-10-CM | POA: Diagnosis not present

## 2014-10-07 DIAGNOSIS — N200 Calculus of kidney: Secondary | ICD-10-CM | POA: Diagnosis not present
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
## 2014-10-09 ENCOUNTER — Encounter: Payer: Self-pay | Admitting: Cardiovascular Disease

## 2014-10-09 ENCOUNTER — Ambulatory Visit (INDEPENDENT_AMBULATORY_CARE_PROVIDER_SITE_OTHER): Payer: Medicare Other | Admitting: Cardiovascular Disease

## 2014-10-09 VITALS — BP 140/80 | HR 77 | Ht 72.0 in | Wt 202.4 lb

## 2014-10-09 DIAGNOSIS — R918 Other nonspecific abnormal finding of lung field: Secondary | ICD-10-CM | POA: Diagnosis not present

## 2014-10-09 DIAGNOSIS — R911 Solitary pulmonary nodule: Secondary | ICD-10-CM | POA: Diagnosis not present

## 2014-10-09 DIAGNOSIS — I10 Essential (primary) hypertension: Secondary | ICD-10-CM

## 2014-10-09 DIAGNOSIS — I429 Cardiomyopathy, unspecified: Secondary | ICD-10-CM | POA: Diagnosis not present

## 2014-10-09 DIAGNOSIS — I447 Left bundle-branch block, unspecified: Secondary | ICD-10-CM

## 2014-10-09 DIAGNOSIS — I428 Other cardiomyopathies: Secondary | ICD-10-CM

## 2014-10-09 NOTE — Assessment & Plan Note (Signed)
Continue beta blocker and ARB no volume excess  Limited by dyspnea from lung disease more  MRI in 6 months

## 2014-10-09 NOTE — Patient Instructions (Addendum)
Your physician wants you to follow-up in:   6 months with DR Blima Singer will receive a reminder letter in the mail two months in advance. If you don't receive a letter, please call our office to schedule the follow-up appointment. Your physician recommends that you continue on your current medications as directed. Please refer to the Current Medication list given to you today. You have been referred to PULMONARY   LUNG  NODULES

## 2014-10-09 NOTE — Assessment & Plan Note (Signed)
Stable chronic no high grade heart block reflective of DCM

## 2014-10-09 NOTE — Assessment & Plan Note (Signed)
Well controlled.  Continue current medications and low sodium Dash type diet.    

## 2014-10-09 NOTE — Assessment & Plan Note (Signed)
Reviewed CT scan multiple nodules some as large as 5 mm Heavy smoker Needs pulmonary referral and PET scan.  Called office manager and he will see Dr Elsworth Soho Monday.  No unusual cough weight loss hemoptysis  Has stopped smoking finally

## 2014-10-13 ENCOUNTER — Ambulatory Visit (INDEPENDENT_AMBULATORY_CARE_PROVIDER_SITE_OTHER): Payer: Medicare Other | Admitting: Pulmonary Disease

## 2014-10-13 ENCOUNTER — Encounter: Payer: Self-pay | Admitting: Pulmonary Disease

## 2014-10-13 VITALS — BP 158/98 | HR 57 | Temp 98.1°F | Ht 72.0 in | Wt 204.8 lb

## 2014-10-13 DIAGNOSIS — R918 Other nonspecific abnormal finding of lung field: Secondary | ICD-10-CM | POA: Diagnosis not present

## 2014-10-13 DIAGNOSIS — N200 Calculus of kidney: Secondary | ICD-10-CM | POA: Diagnosis not present

## 2014-10-13 DIAGNOSIS — N201 Calculus of ureter: Secondary | ICD-10-CM | POA: Diagnosis not present

## 2014-10-13 DIAGNOSIS — J432 Centrilobular emphysema: Secondary | ICD-10-CM | POA: Insufficient documentation

## 2014-10-13 NOTE — Assessment & Plan Note (Signed)
The largest is in the right lower lobe about 5 mm. But this is below resolution of PET scan. Given history of smoking, we will follow-up with a six-month CT scan for stability. If stable, nodules will be followed for at least a period of 2-5 years.

## 2014-10-13 NOTE — Assessment & Plan Note (Signed)
We discussed the role of medications and COPD I offered him the option of pulmonary rehabilitation-he deferred for this time.

## 2014-10-13 NOTE — Patient Instructions (Addendum)
Breathing test shows lung capacity at 66% - moderate COPD Repeat scan in may to check on stability of nodules
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
## 2014-10-15 ENCOUNTER — Inpatient Hospital Stay: Admission: RE | Admit: 2014-10-15 | Payer: Medicare Other | Source: Ambulatory Visit

## 2014-12-26 ENCOUNTER — Encounter (HOSPITAL_COMMUNITY): Payer: Self-pay

## 2014-12-26 ENCOUNTER — Emergency Department (HOSPITAL_COMMUNITY)
Admission: EM | Admit: 2014-12-26 | Discharge: 2014-12-26 | Disposition: A | Discharge status: Home / Self Care | Payer: Medicare Other | Attending: Emergency Medicine | Admitting: Emergency Medicine

## 2014-12-26 ENCOUNTER — Emergency Department (HOSPITAL_COMMUNITY): Payer: Medicare Other

## 2014-12-26 ENCOUNTER — Ambulatory Visit (INDEPENDENT_AMBULATORY_CARE_PROVIDER_SITE_OTHER): Payer: Medicare Other | Admitting: Family Medicine

## 2014-12-26 VITALS — BP 140/80 | HR 62 | Temp 97.6°F | Resp 20 | Ht 72.5 in | Wt 198.4 lb

## 2014-12-26 DIAGNOSIS — Z8546 Personal history of malignant neoplasm of prostate: Secondary | ICD-10-CM | POA: Insufficient documentation

## 2014-12-26 DIAGNOSIS — G43909 Migraine, unspecified, not intractable, without status migrainosus: Secondary | ICD-10-CM | POA: Diagnosis not present

## 2014-12-26 DIAGNOSIS — N529 Male erectile dysfunction, unspecified: Secondary | ICD-10-CM | POA: Diagnosis not present

## 2014-12-26 DIAGNOSIS — E039 Hypothyroidism, unspecified: Secondary | ICD-10-CM | POA: Diagnosis not present

## 2014-12-26 DIAGNOSIS — Z85850 Personal history of malignant neoplasm of thyroid: Secondary | ICD-10-CM | POA: Diagnosis not present

## 2014-12-26 DIAGNOSIS — R05 Cough: Secondary | ICD-10-CM | POA: Diagnosis not present

## 2014-12-26 DIAGNOSIS — J209 Acute bronchitis, unspecified: Secondary | ICD-10-CM | POA: Diagnosis not present

## 2014-12-26 DIAGNOSIS — Z9889 Other specified postprocedural states: Secondary | ICD-10-CM | POA: Diagnosis not present

## 2014-12-26 DIAGNOSIS — Z8719 Personal history of other diseases of the digestive system: Secondary | ICD-10-CM | POA: Insufficient documentation

## 2014-12-26 DIAGNOSIS — J441 Chronic obstructive pulmonary disease with (acute) exacerbation: Secondary | ICD-10-CM | POA: Insufficient documentation

## 2014-12-26 DIAGNOSIS — N189 Chronic kidney disease, unspecified: Secondary | ICD-10-CM | POA: Insufficient documentation

## 2014-12-26 DIAGNOSIS — R112 Nausea with vomiting, unspecified: Secondary | ICD-10-CM | POA: Diagnosis not present

## 2014-12-26 DIAGNOSIS — I129 Hypertensive chronic kidney disease with stage 1 through stage 4 chronic kidney disease, or unspecified chronic kidney disease: Secondary | ICD-10-CM | POA: Diagnosis not present

## 2014-12-26 DIAGNOSIS — R079 Chest pain, unspecified: Secondary | ICD-10-CM

## 2014-12-26 DIAGNOSIS — R0789 Other chest pain: Secondary | ICD-10-CM

## 2014-12-26 DIAGNOSIS — Z87442 Personal history of urinary calculi: Secondary | ICD-10-CM | POA: Diagnosis not present

## 2014-12-26 DIAGNOSIS — Z79899 Other long term (current) drug therapy: Secondary | ICD-10-CM | POA: Insufficient documentation

## 2014-12-26 DIAGNOSIS — Z720 Tobacco use: Secondary | ICD-10-CM | POA: Diagnosis not present

## 2014-12-26 DIAGNOSIS — M1990 Unspecified osteoarthritis, unspecified site: Secondary | ICD-10-CM | POA: Insufficient documentation

## 2014-12-26 DIAGNOSIS — J40 Bronchitis, not specified as acute or chronic: Secondary | ICD-10-CM

## 2014-12-26 DIAGNOSIS — R1110 Vomiting, unspecified: Secondary | ICD-10-CM | POA: Insufficient documentation

## 2014-12-26 DIAGNOSIS — R300 Dysuria: Secondary | ICD-10-CM | POA: Diagnosis not present

## 2014-12-26 LAB — CBC WITH DIFFERENTIAL/PLATELET
Basophils Absolute: 0 10*3/uL (ref 0.0–0.1)
Basophils Relative: 0 % (ref 0–1)
EOS ABS: 0.4 10*3/uL (ref 0.0–0.7)
EOS PCT: 4 % (ref 0–5)
HEMATOCRIT: 40.5 % (ref 39.0–52.0)
HEMOGLOBIN: 13.1 g/dL (ref 13.0–17.0)
Lymphocytes Relative: 21 % (ref 12–46)
Lymphs Abs: 2.3 10*3/uL (ref 0.7–4.0)
MCH: 29.4 pg (ref 26.0–34.0)
MCHC: 32.3 g/dL (ref 30.0–36.0)
MCV: 90.8 fL (ref 78.0–100.0)
MONO ABS: 1 10*3/uL (ref 0.1–1.0)
MONOS PCT: 10 % (ref 3–12)
Neutro Abs: 7 10*3/uL (ref 1.7–7.7)
Neutrophils Relative %: 65 % (ref 43–77)
Platelets: 233 10*3/uL (ref 150–400)
RBC: 4.46 MIL/uL (ref 4.22–5.81)
RDW: 13.2 % (ref 11.5–15.5)
WBC: 10.8 10*3/uL — AB (ref 4.0–10.5)

## 2014-12-26 LAB — COMPREHENSIVE METABOLIC PANEL
ALT: 15 U/L (ref 0–53)
ANION GAP: 8 (ref 5–15)
AST: 15 U/L (ref 0–37)
Albumin: 3.5 g/dL (ref 3.5–5.2)
Alkaline Phosphatase: 94 U/L (ref 39–117)
BILIRUBIN TOTAL: 0.7 mg/dL (ref 0.3–1.2)
BUN: 19 mg/dL (ref 6–23)
CALCIUM: 9.4 mg/dL (ref 8.4–10.5)
CHLORIDE: 102 mmol/L (ref 96–112)
CO2: 29 mmol/L (ref 19–32)
CREATININE: 0.82 mg/dL (ref 0.50–1.35)
GFR, EST NON AFRICAN AMERICAN: 86 mL/min — AB (ref >90)
GLUCOSE: 104 mg/dL — AB (ref 70–99)
Potassium: 4.1 mmol/L (ref 3.5–5.1)
Sodium: 139 mmol/L (ref 135–145)
Total Protein: 6.8 g/dL (ref 6.0–8.3)

## 2014-12-26 LAB — URINALYSIS, ROUTINE W REFLEX MICROSCOPIC
BILIRUBIN URINE: NEGATIVE
GLUCOSE, UA: NEGATIVE mg/dL
Hgb urine dipstick: NEGATIVE
KETONES UR: NEGATIVE mg/dL
LEUKOCYTES UA: NEGATIVE
Nitrite: NEGATIVE
PH: 7 (ref 5.0–8.0)
Protein, ur: NEGATIVE mg/dL
Specific Gravity, Urine: 1.023 (ref 1.005–1.030)
Urobilinogen, UA: 0.2 mg/dL (ref 0.0–1.0)

## 2014-12-26 LAB — LIPASE, BLOOD: LIPASE: 28 U/L (ref 11–59)

## 2014-12-26 LAB — I-STAT TROPONIN, ED
TROPONIN I, POC: 0 ng/mL (ref 0.00–0.08)
Troponin i, poc: 0 ng/mL (ref 0.00–0.08)

## 2014-12-26 LAB — I-STAT CG4 LACTIC ACID, ED: Lactic Acid, Venous: 0.52 mmol/L (ref 0.5–2.0)

## 2014-12-26 MED ORDER — ACETAMINOPHEN 325 MG PO TABS
650.0000 mg | ORAL_TABLET | Freq: Once | ORAL | Status: AC
  Administered 2014-12-26: 650 mg via ORAL
  Filled 2014-12-26: qty 2

## 2014-12-26 MED ORDER — AZITHROMYCIN 250 MG PO TABS: 250.0000 mg | ORAL_TABLET | Freq: Every day | ORAL | Status: DC

## 2014-12-26 MED ORDER — GUAIFENESIN 100 MG/5ML PO LIQD: 100.0000 mg | ORAL | Status: DC | PRN

## 2014-12-26 MED ORDER — ASPIRIN 81 MG PO CHEW
324.0000 mg | CHEWABLE_TABLET | Freq: Once | ORAL | Status: AC
  Administered 2014-12-26: 324 mg via ORAL

## 2014-12-26 MED ORDER — ONDANSETRON 4 MG PO TBDP
4.0000 mg | ORAL_TABLET | Freq: Once | ORAL | Status: AC
  Administered 2014-12-26: 4 mg via ORAL
  Filled 2014-12-26: qty 1

## 2014-12-26 NOTE — Progress Notes (Signed)
° °  Subjective:    Patient ID: Arthur [name]., male    DOB: 1941-09-29, 74 y.o.   MRN: [id number] This chart was scribed for Arthur Haber, MD by Arthur [name], Medical Scribe. This patient was seen in Room 6 and the patient's care was started a 9:24 AM.  Chief Complaint  Patient presents with   Chest Pain    vomiting since last friday--off and on.  feels like it is hard to swallow    HPI HPI Comments: [name] is a 74 y.o. male with a hx of HTN, non-ischemic cardiomyopathy, LBBB, HLD, cancer and multiple lung nodules on CT who presents to [facility] complaining of continuous chest pain for the last 24 hours. Pt states he has been vomiting for the last two days. Pt states he feels like someone is standing on his chest. Pt endorses associated SOB. Pt denies LOC over the last week.  Pt states that when he vomits the vomit is full of white phlegm.    Review of Systems  Constitutional: Negative for fever and chills.  Respiratory: Positive for shortness of breath.   Cardiovascular: Positive for chest pain.  Gastrointestinal: Positive for vomiting. Negative for abdominal pain.       Objective:   Physical Exam  Constitutional: He is oriented to person, place, and time. He appears well-developed and well-nourished. No distress.  HENT:  Head: Normocephalic and atraumatic.  Eyes: Pupils are equal, round, and reactive to light.  Neck: Neck supple.  No JVD.  Cardiovascular: Normal rate.   Heart is slow at 50 beats per minute. Heart sounds distant. Good peripheral pulses.  Pulmonary/Chest: Effort normal. No respiratory distress.  Musculoskeletal: Normal range of motion.  No edema.  Neurological: He is alert and oriented to person, place, and time. Coordination normal.  Skin: Skin is warm and dry. He is not diaphoretic.  Psychiatric: He has a normal mood and affect. His behavior is normal.  Nursing note and vitals reviewed.  EKG:  LBBB    Assessment & Plan:   Patient has  significant symptoms for heart disease. The fact that he feels like someone is standing on his chest and that he's been having vomiting for week necessitate transfer to the emergency room 2 rule out MI.  Signed, Arthur [name].D.

## 2014-12-26 NOTE — Discharge Instructions (Signed)
Chest Pain (Nonspecific) °It is often hard to give a specific diagnosis for the cause of chest pain. There is always a chance that your pain could be related to something serious, such as a heart attack or a blood clot in the lungs. You need to follow up with your health care provider for further evaluation. °CAUSES  °· Heartburn. °· Pneumonia or bronchitis. °· Anxiety or stress. °· Inflammation around your heart (pericarditis) or lung (pleuritis or pleurisy). °· A blood clot in the lung. °· A collapsed lung (pneumothorax). It can develop suddenly on its own (spontaneous pneumothorax) or from trauma to the chest. °· Shingles infection (herpes zoster virus). °The chest wall is composed of bones, muscles, and cartilage. Any of these can be the source of the pain. °· The bones can be bruised by injury. °· The muscles or cartilage can be strained by coughing or overwork. °· The cartilage can be affected by inflammation and become sore (costochondritis). °DIAGNOSIS  °Lab tests or other studies may be needed to find the cause of your pain. Your health care provider may have you take a test called an ambulatory electrocardiogram (ECG). An ECG records your heartbeat patterns over a 24-hour period. You may also have other tests, such as: °· Transthoracic echocardiogram (TTE). During echocardiography, sound waves are used to evaluate how blood flows through your heart. °· Transesophageal echocardiogram (TEE). °· Cardiac monitoring. This allows your health care provider to monitor your heart rate and rhythm in real time. °· Holter monitor. This is a portable device that records your heartbeat and can help diagnose heart arrhythmias. It allows your health care provider to track your heart activity for several days, if needed. °· Stress tests by exercise or by giving medicine that makes the heart beat faster. °TREATMENT  °· Treatment depends on what may be causing your chest pain. Treatment may include: °· Acid blockers for  heartburn. °· Anti-inflammatory medicine. °· Pain medicine for inflammatory conditions. °· Antibiotics if an infection is present. °· You may be advised to change lifestyle habits. This includes stopping smoking and avoiding alcohol, caffeine, and chocolate. °· You may be advised to keep your head raised (elevated) when sleeping. This reduces the chance of acid going backward from your stomach into your esophagus. °Most of the time, nonspecific chest pain will improve within 2-3 days with rest and mild pain medicine.  °HOME CARE INSTRUCTIONS  °· If antibiotics were prescribed, take them as directed. Finish them even if you start to feel better. °· For the next few days, avoid physical activities that bring on chest pain. Continue physical activities as directed. °· Do not use any tobacco products, including cigarettes, chewing tobacco, or electronic cigarettes. °· Avoid drinking alcohol. °· Only take medicine as directed by your health care provider. °· Follow your health care provider's suggestions for further testing if your chest pain does not go away. °· Keep any follow-up appointments you made. If you do not go to an appointment, you could develop lasting (chronic) problems with pain. If there is any problem keeping an appointment, call to reschedule. °SEEK MEDICAL CARE IF:  °· Your chest pain does not go away, even after treatment. °· You have a rash with blisters on your chest. °· You have a fever. °SEEK IMMEDIATE MEDICAL CARE IF:  °· You have increased chest pain or pain that spreads to your arm, neck, jaw, back, or abdomen. °· You have shortness of breath. °· You have an increasing cough, or you cough   up blood.  You have severe back or abdominal pain.  You feel nauseous or vomit.  You have severe weakness.  You faint.  You have chills. This is an emergency. Do not wait to see if the pain will go away. Get medical help at once. Call your local emergency services (911 in [place] Do not drive  yourself to the hospital. MAKE SURE YOU:   Understand these instructions.  Will watch your condition.  Will get help right away if you are not doing well or get worse. Document Released: 06/29/2005 Document Revised: 09/24/2013 Document Reviewed: 04/24/2008 [facility] Patient Information 2015 [place], [place]. This information is not intended to replace advice given to you by your health care provider. Make sure you discuss any questions you have with your health care provider. Acute Bronchitis Bronchitis is inflammation of the airways that extend from the windpipe into the lungs (bronchi). The inflammation often causes mucus to develop. This leads to a cough, which is the most common symptom of bronchitis.  In acute bronchitis, the condition usually develops suddenly and goes away over time, usually in a couple weeks. Smoking, allergies, and asthma can make bronchitis worse. Repeated episodes of bronchitis may cause further lung problems.  CAUSES Acute bronchitis is most often caused by the same virus that causes a cold. The virus can spread from person to person (contagious) through coughing, sneezing, and touching contaminated objects. SIGNS AND SYMPTOMS   Cough.   Fever.   Coughing up mucus.   Body aches.   Chest congestion.   Chills.   Shortness of breath.   Sore throat.  DIAGNOSIS  Acute bronchitis is usually diagnosed through a physical exam. Your health care provider will also ask you questions about your medical history. Tests, such as chest X-rays, are sometimes done to rule out other conditions.  TREATMENT  Acute bronchitis usually goes away in a couple weeks. Oftentimes, no medical treatment is necessary. Medicines are sometimes given for relief of fever or cough. Antibiotic medicines are usually not needed but may be prescribed in certain situations. In some cases, an inhaler may be recommended to help reduce shortness of breath and control the cough. A cool mist  vaporizer may also be used to help thin bronchial secretions and make it easier to clear the chest.  HOME CARE INSTRUCTIONS  Get plenty of rest.   Drink enough fluids to keep your urine clear or pale yellow (unless you have a medical condition that requires fluid restriction). Increasing fluids may help thin your respiratory secretions (sputum) and reduce chest congestion, and it will prevent dehydration.   Take medicines only as directed by your health care provider.  If you were prescribed an antibiotic medicine, finish it all even if you start to feel better.  Avoid smoking and secondhand smoke. Exposure to cigarette smoke or irritating chemicals will make bronchitis worse. If you are a smoker, consider using nicotine gum or skin patches to help control withdrawal symptoms. Quitting smoking will help your lungs heal faster.   Reduce the chances of another bout of acute bronchitis by washing your hands frequently, avoiding people with cold symptoms, and trying not to touch your hands to your mouth, nose, or eyes.   Keep all follow-up visits as directed by your health care provider.  SEEK MEDICAL CARE IF: Your symptoms do not improve after 1 week of treatment.  SEEK IMMEDIATE MEDICAL CARE IF:  You develop an increased fever or chills.   You have chest pain.  You have severe shortness of breath.  You have bloody sputum.   You develop dehydration.  You faint or repeatedly feel like you are going to pass out.  You develop repeated vomiting.  You develop a severe headache. MAKE SURE YOU:   Understand these instructions.  Will watch your condition.  Will get help right away if you are not doing well or get worse. Document Released: 10/27/2004 Document Revised: 02/03/2014 Document Reviewed: 03/12/2013 [facility] Patient Information 2015 [place], [place]. This information is not intended to replace advice given to you by your health care provider. Make sure you discuss any  questions you have with your health care provider.
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
## 2014-12-26 NOTE — ED Notes (Signed)
Pt brought in EMS from [facility] for chest pain.  Pt reports central upper chest pain that feels like a pressure.  Pt reports he has been vomiting since last Friday and started having chest pressure yesterday.

## 2014-12-29 ENCOUNTER — Ambulatory Visit (INDEPENDENT_AMBULATORY_CARE_PROVIDER_SITE_OTHER): Payer: Medicare Other | Admitting: Internal Medicine

## 2014-12-29 VITALS — BP 130/82 | HR 62 | Temp 97.7°F | Resp 18 | Ht 72.0 in | Wt 199.6 lb

## 2014-12-29 DIAGNOSIS — K209 Esophagitis, unspecified without bleeding: Secondary | ICD-10-CM

## 2014-12-29 DIAGNOSIS — R079 Chest pain, unspecified: Secondary | ICD-10-CM

## 2014-12-29 DIAGNOSIS — K219 Gastro-esophageal reflux disease without esophagitis: Secondary | ICD-10-CM

## 2014-12-29 MED ORDER — OMEPRAZOLE 40 MG PO CPDR: 40.0000 mg | DELAYED_RELEASE_CAPSULE | Freq: Every day | ORAL | Status: DC

## 2014-12-29 MED ORDER — GI COCKTAIL ~~LOC~~
30.0000 mL | Freq: Once | ORAL | Status: AC
  Administered 2014-12-29: 30 mL via ORAL

## 2014-12-29 NOTE — Progress Notes (Signed)
   Subjective:    Patient ID: Arthur Quince., male    DOB: [birth year], 74 y.o.   MRN: [id number]  HPI ER visit for chest pain, not cardiac.Also has had laryngitis is on zithromax. Pain anterior , upper associated with eating. Has GI doc and will f/up. Has hx of GERD. 30cc GI coctail trial.got relief.  Review of Systems     Objective:   Physical Exam  Constitutional: He is oriented to person, place, and time. He appears well-developed and well-nourished. No distress.  HENT:  Head: Normocephalic.  Mouth/Throat: Oropharynx is clear and moist.  Eyes: EOM are normal. Pupils are equal, round, and reactive to light.  Neck: Neck supple. No tracheal deviation present. No thyromegaly present.  Cardiovascular: Normal rate, regular rhythm and normal heart sounds.   Pulmonary/Chest: Effort normal. No accessory muscle usage. No tachypnea. No respiratory distress. He has decreased breath sounds. He has no wheezes. He has no rhonchi. He has no rales. He exhibits tenderness.  Abdominal: Soft. Bowel sounds are normal. He exhibits no distension and no mass. There is no tenderness. There is no rebound and no guarding.  Neurological: He is alert and oriented to person, place, and time. He exhibits normal muscle tone. Coordination normal.  Psychiatric: He has a normal mood and affect. His behavior is normal. Judgment and thought content normal.          Assessment & Plan:  Chest pain likely GERD/Esophagitis Omeprazole 40mg qd 65month only/see Dr. Carlean Purl

## 2014-12-30 ENCOUNTER — Ambulatory Visit (INDEPENDENT_AMBULATORY_CARE_PROVIDER_SITE_OTHER): Payer: Self-pay | Admitting: Internal Medicine

## 2014-12-30 DIAGNOSIS — Z021 Encounter for pre-employment examination: Secondary | ICD-10-CM

## 2014-12-30 NOTE — Progress Notes (Signed)
   Subjective:    Patient ID: Arthur [name]., male    DOB: 1941-03-05, 74 y.o.   MRN: [id number]  HPI Here for DOT. Is stable all areas. BP is controlled. Hypothyroid txed. Cardiomyopathy controlled and followed by cardiology. Prostate cancer cured. Tolerates all meds. Drives race cars for hobby.   Review of Systems  Constitutional: Negative.   HENT: Negative.   Eyes: Negative.   Respiratory: Negative.   Cardiovascular: Negative.   Gastrointestinal: Negative.   Endocrine: Negative.   Genitourinary: Negative.   Musculoskeletal: Negative.   Skin: Negative.   Allergic/Immunologic: Negative.   Neurological: Negative.   Hematological: Negative.   Psychiatric/Behavioral: Negative.        Objective:   Physical Exam  Constitutional: He is oriented to person, place, and time. He appears well-developed and well-nourished. No distress.  HENT:  Head: Normocephalic.  Right Ear: External ear normal.  Left Ear: External ear normal.  Mouth/Throat: Oropharynx is clear and moist.  Eyes: Conjunctivae and EOM are normal. Pupils are equal, round, and reactive to light.  Neck: Normal range of motion. Neck supple. No thyromegaly present.  Cardiovascular: Normal rate, regular rhythm and normal heart sounds.   No murmur heard. Pulmonary/Chest: Effort normal and breath sounds normal.  Abdominal: Soft. Bowel sounds are normal. He exhibits no mass.  Genitourinary: Penis normal.  Musculoskeletal: Normal range of motion. He exhibits tenderness.  Lymphadenopathy:    He has no cervical adenopathy.  Neurological: He is alert and oriented to person, place, and time. He has normal reflexes. No cranial nerve deficit. He exhibits normal muscle tone. Coordination normal.  Psychiatric: He has a normal mood and affect. His behavior is normal. Judgment and thought content normal.          Assessment & Plan:  DOT

## 2014-12-30 NOTE — Patient Instructions (Signed)
Hearing Loss A hearing loss is sometimes called deafness. Hearing loss may be partial or total. CAUSES Hearing loss may be caused by:  Wax in the ear canal.  Infection of the ear canal.  Infection of the middle ear.  Trauma to the ear or surrounding area.  Fluid in the middle ear.  A hole in the eardrum (perforated eardrum).  Exposure to loud sounds or music.  Problems with the hearing nerve.  Certain medications. Hearing loss without wax, infection, or a history of injury may mean that the nerve is involved. Hearing loss with severe dizziness, nausea and vomiting or ringing in the ear may suggest a hearing nerve irritation or problems in the middle or inner ear. If hearing loss is untreated, there is a greater likelihood for residual or permanent hearing loss. DIAGNOSIS A hearing test (audiometry) assesses hearing loss. The audiometry test needs to be performed by a hearing specialist (audiologist). TREATMENT Treatment for recent onset of hearing loss may include:  Ear wax removal.  Medications that kill germs (antibiotics).  Cortisone medications.  Prompt follow up with the appropriate specialist. Return of hearing depends on the cause of your hearing loss, so proper medical follow-up is important. Some hearing loss may not be reversible, and a caregiver should discuss care and treatment options with you. SEEK MEDICAL CARE IF:   You have a severe headache, dizziness, or changes in vision.  You have new or increased weakness.  You develop repeated vomiting or other serious medical problems.  You have a fever. Document Released: 09/19/2005 Document Revised: 12/12/2011 Document Reviewed: 01/14/2010 Exit[employer] Patient Information 2015 [employer]. This information is not intended to replace advice given to you by your health care provider. Make sure you discuss any questions you have with your health care provider.  

## 2015-01-09 DIAGNOSIS — N200 Calculus of kidney: Secondary | ICD-10-CM | POA: Diagnosis not present

## 2015-01-09 DIAGNOSIS — N201 Calculus of ureter: Secondary | ICD-10-CM | POA: Diagnosis not present

## 2015-01-23 ENCOUNTER — Other Ambulatory Visit: Payer: Self-pay | Admitting: Internal Medicine

## 2015-02-10 ENCOUNTER — Ambulatory Visit (INDEPENDENT_AMBULATORY_CARE_PROVIDER_SITE_OTHER)
Admission: RE | Admit: 2015-02-10 | Discharge: 2015-02-10 | Disposition: A | Discharge status: Home / Self Care | Payer: Medicare Other | Source: Ambulatory Visit | Attending: Pulmonary Disease | Admitting: Pulmonary Disease

## 2015-02-10 DIAGNOSIS — I2510 Atherosclerotic heart disease of native coronary artery without angina pectoris: Secondary | ICD-10-CM | POA: Diagnosis not present

## 2015-02-10 DIAGNOSIS — R918 Other nonspecific abnormal finding of lung field: Secondary | ICD-10-CM | POA: Diagnosis not present

## 2015-02-17 ENCOUNTER — Telehealth: Payer: Self-pay | Admitting: Pulmonary Disease

## 2015-02-17 NOTE — Telephone Encounter (Signed)
Notes Recorded by Glean Hess, CMA on 02/16/2015 at 4:19 PM lmtcb. ------  Notes Recorded by Glean Hess, CMA on 02/11/2015 at 9:02 AM Lmtcb. ------  Notes Recorded by [name], MD on 02/11/2015 at 7:55 AM Stable nodules, needs follow-up office visit with me or TP to discuss ------- Pt is aware of results. Has ROV with RA on 04/03/15.

## 2015-02-18 ENCOUNTER — Encounter: Payer: Self-pay | Admitting: *Deleted

## 2015-03-11 DIAGNOSIS — H903 Sensorineural hearing loss, bilateral: Secondary | ICD-10-CM | POA: Diagnosis not present

## 2015-03-12 ENCOUNTER — Other Ambulatory Visit: Payer: Self-pay | Admitting: Otolaryngology

## 2015-03-12 DIAGNOSIS — H903 Sensorineural hearing loss, bilateral: Secondary | ICD-10-CM

## 2015-03-20 ENCOUNTER — Ambulatory Visit
Admission: RE | Admit: 2015-03-20 | Discharge: 2015-03-20 | Disposition: A | Discharge status: Home / Self Care | Payer: Medicare Other | Source: Ambulatory Visit | Attending: Otolaryngology | Admitting: Otolaryngology

## 2015-03-20 ENCOUNTER — Other Ambulatory Visit: Payer: Self-pay | Admitting: Otolaryngology

## 2015-03-20 DIAGNOSIS — Z135 Encounter for screening for eye and ear disorders: Secondary | ICD-10-CM | POA: Diagnosis not present

## 2015-03-20 DIAGNOSIS — H903 Sensorineural hearing loss, bilateral: Secondary | ICD-10-CM

## 2015-03-20 DIAGNOSIS — T1590XA Foreign body on external eye, part unspecified, unspecified eye, initial encounter: Secondary | ICD-10-CM

## 2015-03-20 MED ORDER — GADOBENATE DIMEGLUMINE 529 MG/ML IV SOLN
19.0000 mL | Freq: Once | INTRAVENOUS | Status: AC | PRN
  Administered 2015-03-20: 19 mL via INTRAVENOUS

## 2015-03-26 ENCOUNTER — Ambulatory Visit (INDEPENDENT_AMBULATORY_CARE_PROVIDER_SITE_OTHER): Payer: Medicare Other | Admitting: Family Medicine

## 2015-03-26 VITALS — BP 124/80 | HR 67 | Temp 98.1°F | Resp 18 | Ht 72.0 in | Wt 201.0 lb

## 2015-03-26 DIAGNOSIS — J0100 Acute maxillary sinusitis, unspecified: Secondary | ICD-10-CM

## 2015-03-26 DIAGNOSIS — R05 Cough: Secondary | ICD-10-CM | POA: Diagnosis not present

## 2015-03-26 DIAGNOSIS — J029 Acute pharyngitis, unspecified: Secondary | ICD-10-CM | POA: Diagnosis not present

## 2015-03-26 DIAGNOSIS — R059 Cough, unspecified: Secondary | ICD-10-CM

## 2015-03-26 MED ORDER — BENZONATATE 100 MG PO CAPS: 100.0000 mg | ORAL_CAPSULE | Freq: Three times a day (TID) | ORAL | Status: DC | PRN

## 2015-03-26 MED ORDER — HYDROCODONE-HOMATROPINE 5-1.5 MG/5ML PO SYRP: 5.0000 mL | ORAL_SOLUTION | ORAL | Status: DC | PRN

## 2015-03-26 MED ORDER — AMOXICILLIN 875 MG PO TABS: 875.0000 mg | ORAL_TABLET | Freq: Two times a day (BID) | ORAL | Status: DC

## 2015-03-26 NOTE — Progress Notes (Deleted)
   Subjective:    Patient ID: Arthur Quince., male    DOB: [birth year], 74 y.o.   MRN: [id number]  HPI    Review of Systems     Objective:   Physical Exam        Assessment & Plan:

## 2015-03-26 NOTE — Progress Notes (Signed)
  Subjective:  Patient ID: Arthur Quince., male    DOB: 1940-12-13  Age: 74 y.o. MRN: [id number]    74 year old man who has been having a sore throat for the last several days. It feels like a chainsaw cutting him in there. He has been seeing a Counsellor and just had a MRI scan done regarding his unilateral hearing loss and ear pain. MRI did not reveal a problem with his ears, but did reveal he had a maxillary sinusitis and some pansinusitis. No medication was given him yet for that. He does smoke still, about 2 packs per week. He used to smoke about a pack and half a day. He drinks one beer a week. He is otherwise okay. Objective:   No acute distress. His TMs are normal. Right ear is tender to touch. His nose is clear. Throat has erythematous streaking down both lateral aspects. His chest is clear. Heart regular without murmurs. Neck supple without nodes. Some.  Assessment & Plan:   Assessment: Acute pharyngitis Sinusitis, primarily maxillary Cough  Plan: Patient Instructions  Take amoxicillin 875 mg one twice daily. This should treat the sinuses and throat. However sometimes bad sinusitis takes a longer course of antibodies, up to 3 weeks, and if after the 10 days you feel like you are improved but not well let me know and I can give her one refill on the amoxicillin  Drink plenty of water  Take the benzonatate cough pills one pill 3 times daily as needed for cough  Take the cough syrup at bedtime if needed for worse coughing  Get rid of the cigarettes  Keep your follow-up appointments with the ear nose and throat doctors. Return here as needed.      Darlean Warmoth, MD 03/26/2015

## 2015-03-26 NOTE — Patient Instructions (Addendum)
Take amoxicillin 875 mg one twice daily. This should treat the sinuses and throat. However sometimes bad sinusitis takes a longer course of antibodies, up to 3 weeks, and if after the 10 days you feel like you are improved but not well let me know and I can give her one refill on the amoxicillin  Drink plenty of water  Take the benzonatate cough pills one pill 3 times daily as needed for cough  Take the cough syrup at bedtime if needed for worse coughing  Get rid of the cigarettes  Keep your follow-up appointments with the ear nose and throat doctors. Return here as needed.

## 2015-04-01 DIAGNOSIS — J324 Chronic pansinusitis: Secondary | ICD-10-CM | POA: Diagnosis not present

## 2015-04-01 DIAGNOSIS — R05 Cough: Secondary | ICD-10-CM | POA: Diagnosis not present

## 2015-04-01 DIAGNOSIS — J339 Nasal polyp, unspecified: Secondary | ICD-10-CM | POA: Diagnosis not present

## 2015-04-03 ENCOUNTER — Ambulatory Visit (INDEPENDENT_AMBULATORY_CARE_PROVIDER_SITE_OTHER): Payer: Medicare Other | Admitting: Pulmonary Disease

## 2015-04-03 ENCOUNTER — Encounter: Payer: Self-pay | Admitting: Pulmonary Disease

## 2015-04-03 VITALS — BP 140/80 | HR 65 | Ht 72.0 in | Wt 197.8 lb

## 2015-04-03 DIAGNOSIS — J432 Centrilobular emphysema: Secondary | ICD-10-CM

## 2015-04-03 DIAGNOSIS — R918 Other nonspecific abnormal finding of lung field: Secondary | ICD-10-CM

## 2015-04-03 NOTE — Patient Instructions (Signed)
Trial of albuterol 2 puffs every 6h as needed Call if breathing worse CT scan in 6 months Congratulations on quitting  - Try nicotine patches

## 2015-04-03 NOTE — Assessment & Plan Note (Signed)
No change in nodules or subcarinal LN on 92m FU, but high risk CT scan in 6 months
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
## 2015-04-03 NOTE — Assessment & Plan Note (Signed)
Trial of albuterol 2 puffs every 6h as needed Pulm rehab- he would like to defer Call if breathing worse Congratulations on quitting  - Try nicotine patches

## 2015-04-03 NOTE — Addendum Note (Signed)
Addended by: Kara Mead V on: 04/03/2015 10:50 AM   Modules accepted: Orders

## 2015-04-08 NOTE — Progress Notes (Signed)
Patient ID: Arthur [name]., male   DOB: March 31, 1941, 74 y.o.   MRN: [id number] 73 y.o.  initially seen  2009 for nonischemic cardiomyopathy. He has a chronic left bundle branch block. Has been no lower extremity edema. His catheter ago showed no coronary disease. He is no longer taking his lisinopril. Diet is poor with high salt content He has a chronic left bundle-branch block but no evidence of presyncope syncope or high-grade heart block. LV function. was 40% by echo in February of 2009.   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 Wall thickness was normal. Systolic function was moderately reduced. The estimated ejection fraction was in the range of 35% to 40%. Severe hypokinesis of the mid-apicalanteroseptal myocardium. Doppler parameters are consistent with abnormal left ventricular relaxation (grade 1 diastolic dysfunction). - Aortic valve: Valve area (VTI): 3.6 cm^2. Valve area (Vmax): 3.92 cm^2. - Right atrium: The atrium was mildly dilated.   Rough month with kidney stones Also CT chest shows multiple lung nodules and he may have cancer Stopped smoking after chest CT in October when nodules discovered Seeing [facility].  Last CT 02/2015 reviewed stable nodules f/u planned 09/2015  Lots of sinus infections   ROS: Denies fever, malais, weight loss, blurry vision, decreased visual acuity, cough, sputum, SOB, hemoptysis, pleuritic pain, palpitaitons, heartburn, abdominal pain, melena, lower extremity edema, claudication, or rash.  All other systems reviewed and negative  General: Affect appropriate Healthy:  appears stated age 27: normal Neck supple with no adenopathy JVP normal no bruits no thyromegaly Lungs clear with no wheezing and good diaphragmatic motion Heart:  S1/S2 no murmur, no rub, gallop or click PMI normal Abdomen: benighn, BS positve,  no tenderness, no AAA no bruit.  No HSM or HJR Distal pulses intact with no bruits No edema Neuro non-focal Skin warm and dry No muscular weakness   Current Outpatient Prescriptions  Medication Sig Dispense Refill  . Azelastine-Fluticasone (DYMISTA) 137-50 MCG/ACT SUSP Place 2 puffs into the nose daily.    [name] azithromycin (ZITHROMAX Z-PAK) 250 MG tablet Take 1 tablet (250 mg total) by mouth daily. 500mg  PO day 1, then 250mg  PO days 205 6 tablet 0  . carvedilol (COREG) 6.25 MG tablet Take 1 tablet (6.25 mg total) by mouth 2 (two) times daily. 180 tablet 3  . CIALIS 5 MG tablet TAKE 1 TABLET BY MOUTH DAILY AS NEEDED FOR ERECTILE DYSFUNCTION 20 tablet 3  . clindamycin (CLEOCIN) 300 MG capsule Take 300 mg by mouth 3 (three) times daily.    [name] guaiFENesin (ROBITUSSIN) 100 MG/5ML liquid Take 5-10 mLs (100-200 mg total) by mouth every 4 (four) hours as needed for cough. 60 mL 0  . HYDROcodone-homatropine (HYCODAN) 5-1.5 MG/5ML syrup Take 5 mLs by mouth every 4 (four) hours as needed. 120 mL 0  . levothyroxine (SYNTHROID, LEVOTHROID) 200 MCG tablet Take 200 mcg by mouth daily.    [name] losartan (COZAAR) 50 MG tablet Take 1 tablet (50 mg total) by mouth daily. 90 tablet 3  . methocarbamol (ROBAXIN) 500 MG tablet Take 500 mg by mouth every 6 (six) hours as needed for muscle spasms.    . naproxen sodium (ANAPROX) 220 MG tablet Take 440 mg by mouth 2 (two) times daily as needed (for pain).     [name] omeprazole (PRILOSEC) 40 MG capsule Take 1 capsule (  40 mg total) by mouth daily. 30 capsule 3  . rizatriptan (MAXALT) 10 MG tablet Take 10 mg by mouth as needed for migraine. May repeat in 2 hours if needed    . benzonatate (TESSALON) 100 MG capsule Take 1-2 capsules (100-200 mg total) by mouth 3 (three) times daily as needed. (Patient not taking: Reported on 04/10/2015) 30 capsule 0   No current facility-administered medications for this visit.    Allergies  Doxycycline  Electrocardiogram:  SR LBBB first degree  AV block 6/15   Assessment and Plan LBBB: stable no AV block or syncope DCM: stable compliant with meds f/u echo in a year GERD: discussed a low carb diet and limited ETOH Lung Nodule  Stable by CT f/u [facility] would not appear to be active CA Sinus:  F/u ENT  Been on antibiotics

## 2015-04-10 ENCOUNTER — Ambulatory Visit (INDEPENDENT_AMBULATORY_CARE_PROVIDER_SITE_OTHER): Payer: Medicare Other | Admitting: Cardiovascular Disease

## 2015-04-10 ENCOUNTER — Encounter: Payer: Self-pay | Admitting: Cardiovascular Disease

## 2015-04-10 VITALS — BP 138/80 | HR 60 | Ht 72.0 in | Wt 199.4 lb

## 2015-04-10 DIAGNOSIS — I5022 Chronic systolic (congestive) heart failure: Secondary | ICD-10-CM

## 2015-04-10 NOTE — Patient Instructions (Signed)
Medication Instructions:  NO CHANGES  Labwork: NONE  Testing/Procedures: NONE  Follow-Up: Your physician wants you to follow-up in: YEAR WITH  DR NISHAN You will receive a reminder letter in the mail two months in advance. If you don't receive a letter, please call our office to schedule the follow-up appointment.   Any Other Special Instructions Will Be Listed Below (If Applicable).   

## 2015-04-22 DIAGNOSIS — J324 Chronic pansinusitis: Secondary | ICD-10-CM | POA: Diagnosis not present

## 2015-04-22 DIAGNOSIS — J339 Nasal polyp, unspecified: Secondary | ICD-10-CM | POA: Diagnosis not present

## 2015-04-30 ENCOUNTER — Ambulatory Visit (INDEPENDENT_AMBULATORY_CARE_PROVIDER_SITE_OTHER): Payer: Medicare Other | Admitting: Family Medicine

## 2015-04-30 ENCOUNTER — Ambulatory Visit (INDEPENDENT_AMBULATORY_CARE_PROVIDER_SITE_OTHER): Payer: Self-pay | Admitting: Family Medicine

## 2015-04-30 ENCOUNTER — Encounter: Payer: Self-pay | Admitting: Family Medicine

## 2015-04-30 VITALS — BP 148/80 | HR 58 | Temp 97.6°F | Resp 16 | Ht 72.0 in | Wt 210.8 lb

## 2015-04-30 VITALS — BP 156/84 | HR 58 | Temp 97.6°F | Resp 16 | Ht 72.0 in | Wt 210.8 lb

## 2015-04-30 DIAGNOSIS — C61 Malignant neoplasm of prostate: Secondary | ICD-10-CM | POA: Diagnosis not present

## 2015-04-30 DIAGNOSIS — Z125 Encounter for screening for malignant neoplasm of prostate: Secondary | ICD-10-CM | POA: Diagnosis not present

## 2015-04-30 DIAGNOSIS — Z024 Encounter for examination for driving license: Secondary | ICD-10-CM

## 2015-04-30 DIAGNOSIS — Z021 Encounter for pre-employment examination: Secondary | ICD-10-CM

## 2015-04-30 DIAGNOSIS — E039 Hypothyroidism, unspecified: Secondary | ICD-10-CM | POA: Diagnosis not present

## 2015-04-30 MED ORDER — LEVOTHYROXINE SODIUM 200 MCG PO TABS: 200.0000 ug | ORAL_TABLET | Freq: Every day | ORAL | Status: DC

## 2015-04-30 NOTE — Patient Instructions (Signed)
3 month card for blood pressure, but I would also like a clearance letter to drive from your cardiologist and pulmonologist to drive.

## 2015-04-30 NOTE — Patient Instructions (Signed)
meds refilled for 6 months. You should receive a call or letter about your lab results within the next week to 10 days.  Return for physical  - scheduled with new primary provider in next 6 months.
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
## 2015-05-01 LAB — TSH: TSH: 1.812 u[IU]/mL (ref 0.350–4.500)

## 2015-05-02 LAB — PSA, MEDICARE

## 2015-05-15 ENCOUNTER — Encounter: Payer: Self-pay | Admitting: Family Medicine

## 2015-05-22 ENCOUNTER — Other Ambulatory Visit: Payer: Self-pay

## 2015-05-28 ENCOUNTER — Telehealth: Payer: Self-pay | Admitting: *Deleted

## 2015-05-28 ENCOUNTER — Other Ambulatory Visit: Payer: Self-pay | Admitting: *Deleted

## 2015-05-28 MED ORDER — LOSARTAN POTASSIUM 50 MG PO TABS: 50.0000 mg | ORAL_TABLET | Freq: Every day | ORAL | Status: DC

## 2015-05-28 MED ORDER — CARVEDILOL 6.25 MG PO TABS: 6.2500 mg | ORAL_TABLET | Freq: Two times a day (BID) | ORAL | Status: DC

## 2015-05-28 NOTE — Telephone Encounter (Signed)
OK for DOT from heart perspective

## 2015-05-28 NOTE — Telephone Encounter (Signed)
-----   Message from Philbert Riser sent at 05/28/2015 10:21 AM EDT ----- Regarding: RE: 05/12/15   CHRIS----PLEASE CALL THIS PATIENT TRYIG [place] AN APPOINT. FOR DOT.   Geraldine Contras,   This is the wrong patient,  And  I don't know what to do, help me. ----- Message -----    From: Richmond Campbell, LPN    Sent: 0/05/6760   2:50 PM      To: Philbert Riser Subject: RE: 05/12/15   CHRIS----PLEASE CALL THIS PATIE#  NOT  SURE IF  THIS  IS  RIGHT PT   DR Marcie Mowers  SEEN   IN July  IS  SUPPOSE  TO RETURN   IN  YEAR . CHRISTINE ----- Message -----    From: Philbert Riser    Sent: 05/12/2015   9:28 AM      To: Richmond Campbell, LPN Subject: 06/07/08   CHRIS----PLEASE CALL THIS PATIENT T#

## 2015-05-28 NOTE — Telephone Encounter (Addendum)
PT  NEEDS  CLEARANCE  FROM HEART  STANDPOINT  FOR  DOT  WILL FORWARD TO  DR  Johnsie Cancel  FOR  REVIEW  NOTE  NEEDS  TO BE  SENT  TO  DR Carlota Raspberry  WITH URGENT  MED  IF  PT  CLEARED  .Adonis Housekeeper

## 2015-06-01 ENCOUNTER — Encounter: Payer: Self-pay | Admitting: *Deleted

## 2015-06-01 NOTE — Telephone Encounter (Signed)
LETTER ROUTED TO  DR  Carlota Raspberry  VIA   EPIC  PER PT'S REQUEST .Adonis Housekeeper

## 2015-06-07 ENCOUNTER — Ambulatory Visit (INDEPENDENT_AMBULATORY_CARE_PROVIDER_SITE_OTHER): Payer: Medicare Other

## 2015-06-07 ENCOUNTER — Ambulatory Visit (INDEPENDENT_AMBULATORY_CARE_PROVIDER_SITE_OTHER): Payer: Medicare Other | Admitting: Emergency Medicine

## 2015-06-07 VITALS — BP 140/60 | HR 66 | Temp 97.6°F | Resp 14 | Ht 72.0 in | Wt 214.6 lb

## 2015-06-07 DIAGNOSIS — M25511 Pain in right shoulder: Secondary | ICD-10-CM
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
## 2015-06-07 NOTE — Patient Instructions (Signed)
Impingement Syndrome, Rotator Cuff, Bursitis with Rehab Impingement syndrome is a condition that involves inflammation of the tendons of the rotator cuff and the subacromial bursa, that causes pain in the shoulder. The rotator cuff consists of four tendons and muscles that control much of the shoulder and upper arm function. The subacromial bursa is a fluid filled sac that helps reduce friction between the rotator cuff and one of the bones of the shoulder (acromion). Impingement syndrome is usually an overuse injury that causes swelling of the bursa (bursitis), swelling of the tendon (tendonitis), and/or a tear of the tendon (strain). Strains are classified into three categories. Grade 1 strains cause pain, but the tendon is not lengthened. Grade 2 strains include a lengthened ligament, due to the ligament being stretched or partially ruptured. With grade 2 strains there is still function, although the function may be decreased. Grade 3 strains include a complete tear of the tendon or muscle, and function is usually impaired. SYMPTOMS   Pain around the shoulder, often at the outer portion of the upper arm.  Pain that gets worse with shoulder function, especially when reaching overhead or lifting.  Sometimes, aching when not using the arm.  Pain that wakes you up at night.  Sometimes, tenderness, swelling, warmth, or redness over the affected area.  Loss of strength.  Limited motion of the shoulder, especially reaching behind the back (to the back pocket or to unhook bra) or across your body.  Crackling sound (crepitation) when moving the arm.  Biceps tendon pain and inflammation (in the front of the shoulder). Worse when bending the elbow or lifting. CAUSES  Impingement syndrome is often an overuse injury, in which chronic (repetitive) motions cause the tendons or bursa to become inflamed. A strain occurs when a force is paced on the tendon or muscle that is greater than it can withstand.  Common mechanisms of injury include: Stress from sudden increase in duration, frequency, or intensity of training.  Direct hit (trauma) to the shoulder.  Aging, erosion of the tendon with normal use.  Bony bump on shoulder (acromial spur). RISK INCREASES WITH:  Contact sports (football, wrestling, boxing).  Throwing sports (baseball, tennis, volleyball).  Weightlifting and bodybuilding.  Heavy labor.  Previous injury to the rotator cuff, including impingement.  Poor shoulder strength and flexibility.  Failure to warm up properly before activity.  Inadequate protective equipment.  Old age.  Bony bump on shoulder (acromial spur). PREVENTION   Warm up and stretch properly before activity.  Allow for adequate recovery between workouts.  Maintain physical fitness:  Strength, flexibility, and endurance.  Cardiovascular fitness.  Learn and use proper exercise technique. PROGNOSIS  If treated properly, impingement syndrome usually goes away within 6 weeks. Sometimes surgery is required.  RELATED COMPLICATIONS   Longer healing time if not properly treated, or if not given enough time to heal.  Recurring symptoms, that result in a chronic condition.  Shoulder stiffness, frozen shoulder, or loss of motion.  Rotator cuff tendon tear.  Recurring symptoms, especially if activity is resumed too soon, with overuse, with a direct blow, or when using poor technique. TREATMENT  Treatment first involves the use of ice and medicine, to reduce pain and inflammation. The use of strengthening and stretching exercises may help reduce pain with activity. These exercises may be performed at home or with a therapist. If non-surgical treatment is unsuccessful after more than 6 months, surgery may be advised. After surgery and rehabilitation, activity is usually possible in 3 months.  MEDICATION  If pain medicine is needed, nonsteroidal anti-inflammatory medicines (aspirin and  ibuprofen), or other minor pain relievers (acetaminophen), are often advised.  Do not take pain medicine for 7 days before surgery.  Prescription pain relievers may be given, if your caregiver thinks they are needed. Use only as directed and only as much as you need.  Corticosteroid injections may be given by your caregiver. These injections should be reserved for the most serious cases, because they may only be given a certain number of times. HEAT AND COLD  Cold treatment (icing) should be applied for 10 to 15 minutes every 2 to 3 hours for inflammation and pain, and immediately after activity that aggravates your symptoms. Use ice packs or an ice massage.  Heat treatment may be used before performing stretching and strengthening activities prescribed by your caregiver, physical therapist, or athletic trainer. Use a heat pack or a warm water soak. SEEK MEDICAL CARE IF:   Symptoms get worse or do not improve in 4 to 6 weeks, despite treatment.  New, unexplained symptoms develop. (Drugs used in treatment may produce side effects.) EXERCISES  RANGE OF MOTION (ROM) AND STRETCHING EXERCISES - Impingement Syndrome (Rotator Cuff

## 2015-06-08 ENCOUNTER — Ambulatory Visit (INDEPENDENT_AMBULATORY_CARE_PROVIDER_SITE_OTHER): Payer: Medicare Other | Admitting: Family Medicine

## 2015-06-08 VITALS — BP 140/80 | HR 63 | Temp 97.9°F | Resp 16 | Ht 72.0 in | Wt 214.0 lb

## 2015-06-08 DIAGNOSIS — I10 Essential (primary) hypertension: Secondary | ICD-10-CM

## 2015-06-08 DIAGNOSIS — Z23 Encounter for immunization: Secondary | ICD-10-CM

## 2015-06-08 DIAGNOSIS — Z021 Encounter for pre-employment examination: Secondary | ICD-10-CM

## 2015-06-08 DIAGNOSIS — Z024 Encounter for examination for driving license: Secondary | ICD-10-CM

## 2015-06-08 DIAGNOSIS — J438 Other emphysema: Secondary | ICD-10-CM

## 2015-06-08 NOTE — Patient Instructions (Signed)
Your blood pressure does pass today but still borderline. Keep follow-up with cardiologist, and obtain appointment with new primary care provider in next 3 months to recheck blood pressure and possible adjusting medications.Arthur [name] who is accepting new patients is fine to make an appointment with. Your card will be good for 1 year from the last physical, make sure you bring a clearance letter from both her cardiologist in your lung doctor at that next visit. Return to the clinic or go to the nearest emergency room if any of your symptoms worsen or new symptoms occur.
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
## 2015-07-22 DIAGNOSIS — N200 Calculus of kidney: Secondary | ICD-10-CM | POA: Diagnosis not present

## 2015-07-22 DIAGNOSIS — N5201 Erectile dysfunction due to arterial insufficiency: Secondary | ICD-10-CM | POA: Diagnosis not present

## 2015-07-22 DIAGNOSIS — Z8546 Personal history of malignant neoplasm of prostate: Secondary | ICD-10-CM | POA: Diagnosis not present

## 2015-08-25 ENCOUNTER — Ambulatory Visit (INDEPENDENT_AMBULATORY_CARE_PROVIDER_SITE_OTHER): Payer: Medicare Other | Admitting: Family Medicine

## 2015-08-25 VITALS — BP 120/80 | HR 68 | Temp 98.1°F | Resp 18 | Ht 76.0 in | Wt 208.8 lb

## 2015-08-25 DIAGNOSIS — G43009 Migraine without aura, not intractable, without status migrainosus: Secondary | ICD-10-CM | POA: Diagnosis not present

## 2015-08-25 DIAGNOSIS — M7521 Bicipital tendinitis, right shoulder: Secondary | ICD-10-CM | POA: Diagnosis not present

## 2015-08-25 MED ORDER — PREDNISONE 20 MG PO TABS: ORAL_TABLET | ORAL | Status: DC

## 2015-08-25 MED ORDER — RIZATRIPTAN BENZOATE 10 MG PO TABS: 10.0000 mg | ORAL_TABLET | ORAL | Status: DC | PRN
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
## 2015-09-08 DIAGNOSIS — J324 Chronic pansinusitis: Secondary | ICD-10-CM | POA: Diagnosis not present

## 2015-09-17 ENCOUNTER — Ambulatory Visit (INDEPENDENT_AMBULATORY_CARE_PROVIDER_SITE_OTHER): Payer: Medicare Other | Admitting: Family Medicine

## 2015-09-17 VITALS — BP 136/70 | HR 82 | Temp 97.6°F | Resp 16 | Ht 72.0 in | Wt 205.0 lb

## 2015-09-17 DIAGNOSIS — R05 Cough: Secondary | ICD-10-CM | POA: Diagnosis not present

## 2015-09-17 DIAGNOSIS — R350 Frequency of micturition: Secondary | ICD-10-CM

## 2015-09-17 DIAGNOSIS — N393 Stress incontinence (female) (male): Secondary | ICD-10-CM | POA: Diagnosis not present

## 2015-09-17 DIAGNOSIS — N489 Disorder of penis, unspecified: Secondary | ICD-10-CM

## 2015-09-17 DIAGNOSIS — R21 Rash and other nonspecific skin eruption: Secondary | ICD-10-CM

## 2015-09-17 DIAGNOSIS — R059 Cough, unspecified: Secondary | ICD-10-CM

## 2015-09-17 LAB — GLUCOSE, POCT (MANUAL RESULT ENTRY): POC GLUCOSE: 93 mg/dL (ref 70–99)

## 2015-09-17 LAB — POCT URINALYSIS DIP (MANUAL ENTRY)
BILIRUBIN UA: NEGATIVE
BILIRUBIN UA: NEGATIVE
Glucose, UA: NEGATIVE
Nitrite, UA: NEGATIVE
PH UA: 6
Urobilinogen, UA: 1

## 2015-09-17 LAB — POCT CBC
Granulocyte percent: 73.8 %G (ref 37–80)
HEMATOCRIT: 42.7 % — AB (ref 43.5–53.7)
HEMOGLOBIN: 14.9 g/dL (ref 14.1–18.1)
Lymph, poc: 2.5 (ref 0.6–3.4)
MCH, POC: 30.3 pg (ref 27–31.2)
MCHC: 34.8 g/dL (ref 31.8–35.4)
MCV: 86.9 fL (ref 80–97)
MID (cbc): 0.5 (ref 0–0.9)
MPV: 8.4 fL (ref 0–99.8)
PLATELET COUNT, POC: 253 10*3/uL (ref 142–424)
POC GRANULOCYTE: 8.6 — AB (ref 2–6.9)
POC LYMPH %: 21.8 % (ref 10–50)
POC MID %: 4.4 %M (ref 0–12)
RBC: 4.91 M/uL (ref 4.69–6.13)
RDW, POC: 12.4 %
WBC: 11.6 10*3/uL — AB (ref 4.6–10.2)

## 2015-09-17 LAB — POC MICROSCOPIC URINALYSIS (UMFC): Mucus: ABSENT

## 2015-09-17 LAB — POCT SKIN KOH: Skin KOH, POC: NEGATIVE

## 2015-09-17 MED ORDER — HYDROCODONE-HOMATROPINE 5-1.5 MG/5ML PO SYRP: 5.0000 mL | ORAL_SOLUTION | ORAL | Status: DC | PRN

## 2015-09-17 MED ORDER — AZITHROMYCIN 250 MG PO TABS: ORAL_TABLET | ORAL | Status: DC

## 2015-09-17 MED ORDER — FLUCONAZOLE 100 MG PO TABS: ORAL_TABLET | ORAL | Status: DC

## 2015-09-17 MED ORDER — BENZONATATE 100 MG PO CAPS: 100.0000 mg | ORAL_CAPSULE | Freq: Two times a day (BID) | ORAL | Status: DC | PRN

## 2015-09-17 NOTE — Patient Instructions (Addendum)
Use over-the-counter Lotrisone (clotrimazole) cream twice daily on areas of rash  Drink lots of water  Take fluconazole 100 mg 1 daily for 3 days for probable yeast infection  Take azithromycin 2 initially, then 1 daily for 4 days for infection  Take the Hycodan cough syrup if needed for cough. It will cause drowsiness  Take the benzonatate cough pills one or 2 pills 3 times daily as needed for daytime cough  Return if worse

## 2015-09-17 NOTE — Progress Notes (Signed)
Patient ID: Arthur Quince., male    DOB: [birth year]  Age: 74 y.o. MRN: [id number]  Chief Complaint  Patient presents with  . Rash    GENITAL RASH  SINCE YESTERDAY   . Urinary Incontinence    X 4 DAYS     Subjective:   74 year old man who is here because he has a rash on the tip of his penis is been bothering him for the last couple of days. It is been painful vital the tip of the penis. He has constant urinary dribbling. He is been having a cough for last for 5 days, leaks with every cough. This keeps the the tip wet. He's developed a rash both on his penis and along the shaft and scrotum.  Current allergies, medications, problem list, past/family and social histories reviewed.  Objective:  BP 136/70 mmHg  Pulse 82  Temp(Src) 97.6 F (36.4 C) (Oral)  Resp 16  Ht 6' (1.829 m)  Wt 205 lb (92.987 kg)  BMI 27.80 kg/m2  SpO2 96%  Throat clear. Neck supple without nodes. Chest clear. Heart regular without murmurs. He apparently is getting ready to have some further studies for lung cancer. He has a crusted rash on the tip of his penis with a little erythema there. He has more erythema down underneath the lower aspect of the foreskin and around the lower shaft and anterior portion of the sac.  Smear w/KOH was taken from the tip of the penis and all I see is.  Results for orders placed or performed in visit on 09/17/15  POCT Skin KOH  Result Value Ref Range   Skin KOH, POC Negative   POCT CBC  Result Value Ref Range   WBC 11.6 (A) 4.6 - 10.2 K/uL   Lymph, poc 2.5 0.6 - 3.4   POC LYMPH PERCENT 21.8 10 - 50 %L   MID (cbc) 0.5 0 - 0.9   POC MID % 4.4 0 - 12 %M   POC Granulocyte 8.6 (A) 2 - 6.9   Granulocyte percent 73.8 37 - 80 %G   RBC 4.91 4.69 - 6.13 M/uL   Hemoglobin 14.9 14.1 - 18.1 g/dL   HCT, POC 42.7 (A) 43.5 - 53.7 %   MCV 86.9 80 - 97 fL   MCH, POC 30.3 27 - 31.2 pg   MCHC 34.8 31.8 - 35.4 g/dL   RDW, POC 12.4 %   Platelet Count, POC 253 142 - 424 K/uL   MPV 8.4  0 - 99.8 fL  POCT glucose (manual entry)  Result Value Ref Range   POC Glucose 93 70 - 99 mg/dl  POCT urinalysis dipstick  Result Value Ref Range   Color, UA yellow yellow   Clarity, UA cloudy (A) clear   Glucose, UA negative negative   Bilirubin, UA negative negative   Ketones, POC UA negative negative   Spec Grav, UA >=1.030    Blood, UA large (A) negative   pH, UA 6.0    Protein Ur, POC =100 (A) negative   Urobilinogen, UA 1.0    Nitrite, UA Negative Negative   Leukocytes, UA Trace (A) Negative  POCT Microscopic Urinalysis ([facility])  Result Value Ref Range   WBC,UR,HPF,POC None None WBC/hpf   RBC,UR,HPF,POC Too numerous to count  (A) None RBC/hpf   Bacteria None None, Too numerous to count   Mucus Absent Absent   Epithelial Cells, UR Per Microscopy None None, Too numerous to count cells/hpf     Assessment &  Plan:   Assessment: 1. Cough   2. Stress incontinence   3. Penile rash   4. Urinary frequency       Plan: Suspect he has yeast affecting him  Orders Placed This Encounter  Procedures  . POCT Skin KOH  . POCT CBC  . POCT glucose (manual entry)  . POCT urinalysis dipstick  . POCT Microscopic Urinalysis ([facility])    No orders of the defined types were placed in this encounter.         Patient Instructions  Use over-the-counter Lotrisone (clotrimazole) cream twice daily on areas of rash  Drink lots of water  Take fluconazole 100 mg 1 daily for 3 days for probable yeast infection  Take azithromycin 2 initially, then 1 daily for 4 days for infection  Take the Hycodan cough syrup if needed for cough. It will cause drowsiness  Take the benzonatate cough pills one or 2 pills 3 times daily as needed for daytime cough  Return if worse     Return if symptoms worsen or fail to improve.   HOPPER,DAVID, MD 09/17/2015

## 2015-09-29 ENCOUNTER — Inpatient Hospital Stay: Admission: RE | Admit: 2015-09-29 | Payer: Medicare Other | Source: Ambulatory Visit

## 2015-09-29 ENCOUNTER — Telehealth: Payer: Self-pay | Admitting: Pulmonary Disease

## 2015-09-29 ENCOUNTER — Ambulatory Visit (INDEPENDENT_AMBULATORY_CARE_PROVIDER_SITE_OTHER)
Admission: RE | Admit: 2015-09-29 | Discharge: 2015-09-29 | Disposition: A | Discharge status: Home / Self Care | Payer: Medicare Other | Source: Ambulatory Visit | Attending: Pulmonary Disease | Admitting: Pulmonary Disease

## 2015-09-29 DIAGNOSIS — R918 Other nonspecific abnormal finding of lung field: Secondary | ICD-10-CM

## 2015-09-29 NOTE — Telephone Encounter (Signed)
LMTCB x1 for pt.  

## 2015-09-30 NOTE — Telephone Encounter (Signed)
Spoke with pt- states that he was able to be worked in 09/29/15 afternoon. Aware that I will let TP/RA know this was done and unless we call before, we will follow up with him at his upcoming appt with TP 10/07/15 @ 930 with results. Nothing further needed.

## 2015-10-07 ENCOUNTER — Ambulatory Visit: Payer: Medicare Other | Admitting: Adult Health

## 2015-10-20 ENCOUNTER — Encounter: Payer: Self-pay | Admitting: Adult Health

## 2015-10-20 ENCOUNTER — Ambulatory Visit (INDEPENDENT_AMBULATORY_CARE_PROVIDER_SITE_OTHER): Payer: Medicare Other | Admitting: Adult Health

## 2015-10-20 VITALS — BP 140/78 | HR 75 | Temp 98.2°F | Ht 72.0 in | Wt 216.0 lb

## 2015-10-20 DIAGNOSIS — J432 Centrilobular emphysema: Secondary | ICD-10-CM

## 2015-10-20 DIAGNOSIS — Z23 Encounter for immunization: Secondary | ICD-10-CM | POA: Diagnosis not present

## 2015-10-20 DIAGNOSIS — R918 Other nonspecific abnormal finding of lung field: Secondary | ICD-10-CM | POA: Diagnosis not present

## 2015-10-20 NOTE — Assessment & Plan Note (Signed)
Most recent serial CT chest showed all nodules stable or smaller c/w benign etiology.

## 2015-10-20 NOTE — Patient Instructions (Addendum)
Prevnar Vaccine today .  Continue on current regimen  Follow up Dr. Elsworth Soho  In 6 months and As needed

## 2015-10-20 NOTE — Progress Notes (Signed)
Subjective:    Patient ID: Arthur [name]., male    DOB: May 22, 1941, 75 y.o.   MRN: [id number]  HPI 75 yo male smoker  with ischemic CM followed for multiple lung nodules and COPD   Significant tests/ events  08/2014 CT chest showed centrilobular emphysema and multiple subcentimeter nodules, largest being 5 mm in the right lower lobe.  02/2015 CT chest >> unchanged nodules, 1cm subcarinal LN unchanged 10/2014 Spirometry FEV1 66%, with a ratio 63, and FVC 79%  MRI 03/2015 >> Air-fluid level left maxillary sinus, pansinusitis   10/20/2015 Follow up :  Pt returns for a 6 month follow up .  Pt had follow up CT chest 09/29/15 with all previously noted nodules are stable or smaller c/w benign etiology . Discussed these results.   Flu, PVX are utd. Discussed Prevnar today.    Pt states his breathing is unchanged since last ov. Pt states he has no new complaints at this time.. Does still smoke some, discussed cessation.  No flare of cough or wheezing.  No on controller inhaler.     Review of Systems Constitutional:   No  weight loss, night sweats,  Fevers, chills, fatigue, or  lassitude.  HEENT:   No headaches,  Difficulty swallowing,  Tooth/dental problems, or  Sore throat,                No sneezing, itching, ear ache, nasal congestion, post nasal drip,   CV:  No chest pain,  Orthopnea, PND, swelling in lower extremities, anasarca, dizziness, palpitations, syncope.   GI  No heartburn, indigestion, abdominal pain, nausea, vomiting, diarrhea, change in bowel habits, loss of appetite, bloody stools.   Resp: No shortness of breath with exertion or at rest.  No excess mucus, no productive cough,  No non-productive cough,  No coughing up of blood.  No change in color of mucus.  No wheezing.  No chest wall deformity  Skin: no rash or lesions.  GU: no dysuria, change in color of urine, no urgency or frequency.  No flank pain, no hematuria   MS:  No joint pain or swelling.  No  decreased range of motion.  No back pain.  Psych:  No change in mood or affect. No depression or anxiety.  No memory loss.         Objective:   Physical Exam Filed Vitals:   10/20/15 1601  BP: 140/78  Pulse: 75  Temp: 98.2 F (36.8 C)  TempSrc: Oral  Height: 6' (1.829 m)  Weight: 216 lb (97.977 kg)  SpO2: 96%   Body mass index is 29.29 kg/(m^2). GEN: A/Ox3; pleasant , NAD, elderly   HEENT:  [place]/AT,  EACs-clear, TMs-wnl, NOSE-clear, THROAT-clear, no lesions, no postnasal drip or exudate noted.   NECK:  Supple w/ fair ROM; no JVD; normal carotid impulses w/o bruits; no thyromegaly or nodules palpated; no lymphadenopathy.  RESP  Decreased BS in bases , no wheezing or accessory use.  no dullness to percussion  CARD:  RRR, no m/r/g  , no peripheral edema, pulses intact, no cyanosis or clubbing.  GI:   Soft & nt; nml bowel sounds; no organomegaly or masses detected.  Musco: Warm bil, no deformities or joint swelling noted.   Neuro: alert, no focal deficits noted.    Skin: Warm, no lesions or rashes  09/29/15 CT chest  All previously noted pulmonary nodules are either stable or smaller than the prior examination, and at this time are considered benign  requiring no future imaging followup. 2. There is a cluster of multiple new tiny 1-2 mm nodules in the periphery of the left upper lobe which have a tree-in-bud appearance, most compatible with benign areas of mucoid impaction within terminal bronchioles.         Assessment & Plan:

## 2015-10-20 NOTE — Assessment & Plan Note (Addendum)
Compensated without flare  No controller at this time, cont to monitor  Smoking cessation key.  prevnar vaccine today.

## 2015-10-21 ENCOUNTER — Other Ambulatory Visit (HOSPITAL_COMMUNITY): Payer: Self-pay | Admitting: Otolaryngology

## 2015-10-21 ENCOUNTER — Encounter (HOSPITAL_COMMUNITY)
Admission: RE | Admit: 2015-10-21 | Discharge: 2015-10-21 | Disposition: A | Discharge status: Home / Self Care | Payer: Medicare Other | Source: Ambulatory Visit | Attending: Otolaryngology | Admitting: Otolaryngology

## 2015-10-21 ENCOUNTER — Encounter (HOSPITAL_COMMUNITY): Payer: Self-pay

## 2015-10-21 DIAGNOSIS — J329 Chronic sinusitis, unspecified: Secondary | ICD-10-CM | POA: Diagnosis not present

## 2015-10-21 DIAGNOSIS — Z01812 Encounter for preprocedural laboratory examination: Secondary | ICD-10-CM | POA: Insufficient documentation

## 2015-10-21 HISTORY — DX: Reserved for inherently not codable concepts without codable children: IMO0001

## 2015-10-21 HISTORY — DX: Functional dyspepsia: K30

## 2015-10-21 LAB — CBC
HEMATOCRIT: 42.3 % (ref 39.0–52.0)
HEMOGLOBIN: 13.8 g/dL (ref 13.0–17.0)
MCH: 29.9 pg (ref 26.0–34.0)
MCHC: 32.6 g/dL (ref 30.0–36.0)
MCV: 91.8 fL (ref 78.0–100.0)
Platelets: 185 10*3/uL (ref 150–400)
RBC: 4.61 MIL/uL (ref 4.22–5.81)
RDW: 13.1 % (ref 11.5–15.5)
WBC: 11.2 10*3/uL — AB (ref 4.0–10.5)

## 2015-10-21 LAB — BASIC METABOLIC PANEL
Anion gap: 7 (ref 5–15)
BUN: 15 mg/dL (ref 6–20)
CO2: 30 mmol/L (ref 22–32)
CREATININE: 0.95 mg/dL (ref 0.61–1.24)
Calcium: 10 mg/dL (ref 8.9–10.3)
Chloride: 104 mmol/L (ref 101–111)
GFR calc Af Amer: >60 mL/min (ref >60)
Glucose, Bld: 87 mg/dL (ref 65–99)
Potassium: 3.9 mmol/L (ref 3.5–5.1)
SODIUM: 141 mmol/L (ref 135–145)

## 2015-10-21 NOTE — Pre-Procedure Instructions (Signed)
[name]  10/21/2015      [id number], [place] [address] Phone: [phone] Fax: [phone]    Your procedure is scheduled on   Wednesday  10/28/15  Report to [facility] Admitting at 630 A.M.  Call this number if you have problems the morning of surgery:  [phone]   Remember:  Do not eat food or drink liquids after midnight.  Take these medicines the morning of surgery with A SIP OF WATER   CARVEDILOL (COREG), LEVOTHYROXINE (NO ALEVE, ASPIRIN , IBUPROFEN/ ADVIL/ MOTRIN)   Do not wear jewelry, make-up or nail polish.  Do not wear lotions, powders, or perfumes.  You may wear deodorant.  Do not shave 48 hours prior to surgery.  Men may shave face and neck.  Do not bring valuables to the hospital.  [facility] is not responsible for any belongings or valuables.  Contacts, dentures or bridgework may not be worn into surgery.  Leave your suitcase in the car.  After surgery it may be brought to your room.  For patients admitted to the hospital, discharge time will be determined by your treatment team.  Patients discharged the day of surgery will not be allowed to drive home.   Name and phone number of your driver:   Special instructions:  [facility] - Preparing for Surgery  Before surgery, you can play an important role.  Because skin is not sterile, your skin needs to be as free of germs as possible.  You can reduce the number of germs on you skin by washing with CHG (chlorahexidine gluconate) soap before surgery.  CHG is an antiseptic cleaner which kills germs and bonds with the skin to continue killing germs even after washing.  Please DO NOT use if you have an allergy to CHG or antibacterial soaps.  If your skin becomes reddened/irritated stop using the CHG and inform your nurse when you arrive at Short Stay.  Do not shave (including legs and underarms) for at  least 48 hours prior to the first CHG shower.  You may shave your face.  Please follow these instructions carefully:   1.  Shower with CHG Soap the night before surgery and the                                morning of Surgery.  2.  If you choose to wash your hair, wash your hair first as usual with your       normal shampoo.  3.  After you shampoo, rinse your hair and body thoroughly to remove the                      Shampoo.  4.  Use CHG as you would any other liquid soap.  You can apply chg directly       to the skin and wash gently with scrungie or a clean washcloth.  5.  Apply the CHG Soap to your body ONLY FROM THE NECK DOWN.        Do not use on open wounds or open sores.  Avoid contact with your eyes,       ears, mouth and genitals (private parts).  Wash genitals (private parts)       with your normal soap.  6.  Wash thoroughly, paying special attention to  the area where your surgery        will be performed.  7.  Thoroughly rinse your body with warm water from the neck down.  8.  DO NOT shower/wash with your normal soap after using and rinsing off       the CHG Soap.  9.  Pat yourself dry with a clean towel.            10.  Wear clean pajamas.            11.  Place clean sheets on your bed the night of your first shower and do not        sleep with pets.  Day of Surgery  Do not apply any lotions/deoderants the morning of surgery.  Please wear clean clothes to the hospital/surgery center.    Please read over the following fact sheets that you were given. Pain Booklet, Coughing and Deep Breathing and Surgical Site Infection Prevention

## 2015-10-21 NOTE — Progress Notes (Signed)
Reviewed & agree with plan  

## 2015-10-21 NOTE — Progress Notes (Signed)
REQUESTED STRESS TEST FROM DR. NISHAN'S OFFICE.

## 2015-10-21 NOTE — Progress Notes (Signed)
   10/21/15 1520  OBSTRUCTIVE SLEEP APNEA  Have you ever been diagnosed with sleep apnea through a sleep study? No  Do you snore loudly (loud enough to be heard through closed doors)?  1  Do you often feel tired, fatigued, or sleepy during the daytime (such as falling asleep during driving or talking to someone)? 0  Has anyone observed you stop breathing during your sleep? 1  Do you have, or are you being treated for high blood pressure? 1  BMI more than 35 kg/m2? 0  Age > 50 (1-yes) 1  Neck circumference greater than:Male 16 inches or larger, Male 17inches or larger? 1  Male Gender (Yes=1) 1  Obstructive Sleep Apnea Score 6  Score 5 or greater  Results sent to PCP

## 2015-10-28 ENCOUNTER — Encounter (HOSPITAL_COMMUNITY): Payer: Self-pay | Admitting: *Deleted

## 2015-10-28 ENCOUNTER — Ambulatory Visit (HOSPITAL_COMMUNITY)
Admission: RE | Admit: 2015-10-28 | Discharge: 2015-10-28 | Disposition: A | Discharge status: Home / Self Care | Payer: Medicare Other | Source: Ambulatory Visit | Attending: Otolaryngology | Admitting: Otolaryngology

## 2015-10-28 ENCOUNTER — Encounter (HOSPITAL_COMMUNITY)
Admission: RE | Disposition: A | Discharge status: Home / Self Care | Payer: Self-pay | Source: Ambulatory Visit | Attending: Otolaryngology

## 2015-10-28 ENCOUNTER — Ambulatory Visit (HOSPITAL_COMMUNITY): Payer: Medicare Other | Admitting: Anesthesiology

## 2015-10-28 DIAGNOSIS — J339 Nasal polyp, unspecified: Secondary | ICD-10-CM | POA: Diagnosis not present

## 2015-10-28 DIAGNOSIS — I428 Other cardiomyopathies: Secondary | ICD-10-CM | POA: Diagnosis not present

## 2015-10-28 DIAGNOSIS — I10 Essential (primary) hypertension: Secondary | ICD-10-CM | POA: Insufficient documentation

## 2015-10-28 DIAGNOSIS — J321 Chronic frontal sinusitis: Secondary | ICD-10-CM | POA: Diagnosis not present

## 2015-10-28 DIAGNOSIS — E039 Hypothyroidism, unspecified: Secondary | ICD-10-CM | POA: Insufficient documentation

## 2015-10-28 DIAGNOSIS — M1990 Unspecified osteoarthritis, unspecified site: Secondary | ICD-10-CM | POA: Diagnosis not present

## 2015-10-28 DIAGNOSIS — J338 Other polyp of sinus: Secondary | ICD-10-CM | POA: Insufficient documentation

## 2015-10-28 DIAGNOSIS — E7800 Pure hypercholesterolemia, unspecified: Secondary | ICD-10-CM | POA: Insufficient documentation

## 2015-10-28 DIAGNOSIS — J320 Chronic maxillary sinusitis: Secondary | ICD-10-CM | POA: Diagnosis not present

## 2015-10-28 DIAGNOSIS — Z85850 Personal history of malignant neoplasm of thyroid: Secondary | ICD-10-CM | POA: Insufficient documentation

## 2015-10-28 DIAGNOSIS — Z79899 Other long term (current) drug therapy: Secondary | ICD-10-CM | POA: Diagnosis not present

## 2015-10-28 DIAGNOSIS — J328 Other chronic sinusitis: Secondary | ICD-10-CM | POA: Diagnosis not present

## 2015-10-28 DIAGNOSIS — F17210 Nicotine dependence, cigarettes, uncomplicated: Secondary | ICD-10-CM | POA: Insufficient documentation

## 2015-10-28 DIAGNOSIS — J329 Chronic sinusitis, unspecified: Secondary | ICD-10-CM | POA: Diagnosis not present

## 2015-10-28 DIAGNOSIS — Z8546 Personal history of malignant neoplasm of prostate: Secondary | ICD-10-CM | POA: Diagnosis not present

## 2015-10-28 DIAGNOSIS — K210 Gastro-esophageal reflux disease with esophagitis: Secondary | ICD-10-CM | POA: Diagnosis not present

## 2015-10-28 DIAGNOSIS — J449 Chronic obstructive pulmonary disease, unspecified: Secondary | ICD-10-CM | POA: Insufficient documentation

## 2015-10-28 DIAGNOSIS — J322 Chronic ethmoidal sinusitis: Secondary | ICD-10-CM | POA: Diagnosis not present

## 2015-10-28 DIAGNOSIS — Z96652 Presence of left artificial knee joint: Secondary | ICD-10-CM | POA: Insufficient documentation

## 2015-10-28 HISTORY — PX: SINUS ENDO W/FUSION: SHX777

## 2015-10-28 SURGERY — SINUS SURGERY, ENDOSCOPIC, USING COMPUTER-ASSISTED NAVIGATION
Anesthesia: General | Site: Nose | Laterality: Bilateral

## 2015-10-28 MED ORDER — SODIUM CHLORIDE 0.9 % IR SOLN
Status: DC | PRN
  Administered 2015-10-28: 1000 mL

## 2015-10-28 MED ORDER — LIDOCAINE-EPINEPHRINE 1 %-1:100000 IJ SOLN
INTRAMUSCULAR | Status: DC | PRN
  Administered 2015-10-28: 5 mL

## 2015-10-28 MED ORDER — PHENYLEPHRINE HCL 10 MG/ML IJ SOLN
10.0000 mg | INTRAVENOUS | Status: DC | PRN
  Administered 2015-10-28: 15 ug/min via INTRAVENOUS

## 2015-10-28 MED ORDER — MUPIROCIN CALCIUM 2 % EX CREA
TOPICAL_CREAM | CUTANEOUS | Status: DC | PRN
  Administered 2015-10-28: 1 via TOPICAL

## 2015-10-28 MED ORDER — SUCCINYLCHOLINE CHLORIDE 20 MG/ML IJ SOLN
INTRAMUSCULAR | Status: AC
  Filled 2015-10-28: qty 1

## 2015-10-28 MED ORDER — CARVEDILOL 3.125 MG PO TABS
ORAL_TABLET | ORAL | Status: AC
  Filled 2015-10-28: qty 2

## 2015-10-28 MED ORDER — ARTIFICIAL TEARS OP OINT
TOPICAL_OINTMENT | OPHTHALMIC | Status: AC
  Filled 2015-10-28: qty 3.5

## 2015-10-28 MED ORDER — LIDOCAINE HCL (CARDIAC) 20 MG/ML IV SOLN
INTRAVENOUS | Status: AC
  Filled 2015-10-28: qty 5

## 2015-10-28 MED ORDER — PROPOFOL 10 MG/ML IV BOLUS
INTRAVENOUS | Status: AC
  Filled 2015-10-28: qty 20

## 2015-10-28 MED ORDER — MIDAZOLAM HCL 5 MG/5ML IJ SOLN
INTRAMUSCULAR | Status: DC | PRN
Start: 2015-10-28 — End: 2015-10-28
  Administered 2015-10-28: 2 mg via INTRAVENOUS

## 2015-10-28 MED ORDER — FENTANYL CITRATE (PF) 100 MCG/2ML IJ SOLN
INTRAMUSCULAR | Status: AC
  Administered 2015-10-28: 50 ug via INTRAVENOUS
  Filled 2015-10-28: qty 2

## 2015-10-28 MED ORDER — EPHEDRINE SULFATE 50 MG/ML IJ SOLN
INTRAMUSCULAR | Status: DC | PRN
  Administered 2015-10-28: 10 mg via INTRAVENOUS
  Administered 2015-10-28: 5 mg via INTRAVENOUS

## 2015-10-28 MED ORDER — CARVEDILOL 6.25 MG PO TABS
6.2500 mg | ORAL_TABLET | Freq: Once | ORAL | Status: AC
  Administered 2015-10-28: 6.25 mg via ORAL

## 2015-10-28 MED ORDER — MUPIROCIN 2 % EX OINT
TOPICAL_OINTMENT | CUTANEOUS | Status: AC
  Filled 2015-10-28: qty 22

## 2015-10-28 MED ORDER — FENTANYL CITRATE (PF) 250 MCG/5ML IJ SOLN
INTRAMUSCULAR | Status: AC
Start: 2015-10-28 — End: 2015-10-28
  Filled 2015-10-28: qty 5

## 2015-10-28 MED ORDER — ACETAMINOPHEN 10 MG/ML IV SOLN
INTRAVENOUS | Status: AC
  Filled 2015-10-28: qty 100

## 2015-10-28 MED ORDER — HYDROCODONE-ACETAMINOPHEN 5-325 MG PO TABS: 1.0000 | ORAL_TABLET | Freq: Four times a day (QID) | ORAL | Status: DC | PRN

## 2015-10-28 MED ORDER — PROPOFOL 10 MG/ML IV BOLUS
INTRAVENOUS | Status: DC | PRN
  Administered 2015-10-28: 100 mg via INTRAVENOUS

## 2015-10-28 MED ORDER — OXYMETAZOLINE HCL 0.05 % NA SOLN
NASAL | Status: AC
  Filled 2015-10-28: qty 15

## 2015-10-28 MED ORDER — LACTATED RINGERS IV SOLN
INTRAVENOUS | Status: DC | PRN
  Administered 2015-10-28 (×2): via INTRAVENOUS

## 2015-10-28 MED ORDER — ONDANSETRON HCL 4 MG/2ML IJ SOLN: 4.0000 mg | Freq: Once | INTRAMUSCULAR | Status: DC | PRN

## 2015-10-28 MED ORDER — LIDOCAINE-EPINEPHRINE 1 %-1:100000 IJ SOLN
INTRAMUSCULAR | Status: AC
  Filled 2015-10-28: qty 1

## 2015-10-28 MED ORDER — GLYCOPYRROLATE 0.2 MG/ML IJ SOLN
INTRAMUSCULAR | Status: AC
  Filled 2015-10-28: qty 2

## 2015-10-28 MED ORDER — FENTANYL CITRATE (PF) 100 MCG/2ML IJ SOLN
INTRAMUSCULAR | Status: DC | PRN
  Administered 2015-10-28: 50 ug via INTRAVENOUS
  Administered 2015-10-28: 100 ug via INTRAVENOUS

## 2015-10-28 MED ORDER — ROCURONIUM BROMIDE 100 MG/10ML IV SOLN
INTRAVENOUS | Status: DC | PRN
  Administered 2015-10-28: 40 mg via INTRAVENOUS

## 2015-10-28 MED ORDER — EPHEDRINE SULFATE 50 MG/ML IJ SOLN
INTRAMUSCULAR | Status: AC
  Filled 2015-10-28: qty 1

## 2015-10-28 MED ORDER — FENTANYL CITRATE (PF) 100 MCG/2ML IJ SOLN
INTRAMUSCULAR | Status: AC
  Filled 2015-10-28: qty 2

## 2015-10-28 MED ORDER — ONDANSETRON HCL 4 MG/2ML IJ SOLN
INTRAMUSCULAR | Status: DC | PRN
  Administered 2015-10-28: 4 mg via INTRAVENOUS

## 2015-10-28 MED ORDER — FENTANYL CITRATE (PF) 100 MCG/2ML IJ SOLN
25.0000 ug | INTRAMUSCULAR | Status: DC | PRN
  Administered 2015-10-28 (×3): 50 ug via INTRAVENOUS

## 2015-10-28 MED ORDER — ONDANSETRON HCL 4 MG/2ML IJ SOLN
INTRAMUSCULAR | Status: AC
  Filled 2015-10-28: qty 2

## 2015-10-28 MED ORDER — MIDAZOLAM HCL 2 MG/2ML IJ SOLN
INTRAMUSCULAR | Status: AC
  Filled 2015-10-28: qty 2

## 2015-10-28 MED ORDER — NEOSTIGMINE METHYLSULFATE 10 MG/10ML IV SOLN
INTRAVENOUS | Status: DC | PRN
  Administered 2015-10-28: 3 mg via INTRAVENOUS

## 2015-10-28 MED ORDER — SODIUM CHLORIDE 0.9 % IJ SOLN
INTRAMUSCULAR | Status: AC
  Filled 2015-10-28: qty 10

## 2015-10-28 MED ORDER — ACETAMINOPHEN 10 MG/ML IV SOLN
1000.0000 mg | Freq: Once | INTRAVENOUS | Status: AC
  Administered 2015-10-28: 1000 mg via INTRAVENOUS

## 2015-10-28 MED ORDER — LIDOCAINE HCL (CARDIAC) 20 MG/ML IV SOLN
INTRAVENOUS | Status: DC | PRN
  Administered 2015-10-28: 5 mL via INTRATRACHEAL

## 2015-10-28 MED ORDER — ROCURONIUM BROMIDE 50 MG/5ML IV SOLN
INTRAVENOUS | Status: AC
  Filled 2015-10-28: qty 1

## 2015-10-28 MED ORDER — GLYCOPYRROLATE 0.2 MG/ML IJ SOLN
INTRAMUSCULAR | Status: DC | PRN
  Administered 2015-10-28: 0.4 mg via INTRAVENOUS

## 2015-10-28 MED ORDER — OXYMETAZOLINE HCL 0.05 % NA SOLN
NASAL | Status: DC | PRN
  Administered 2015-10-28: 1 via TOPICAL

## 2015-10-28 MED ORDER — CEPHALEXIN 500 MG PO CAPS: 500.0000 mg | ORAL_CAPSULE | Freq: Three times a day (TID) | ORAL | Status: DC

## 2015-10-28 SURGICAL SUPPLY — 53 items
BLADE 10 SAFETY STRL DISP (BLADE) ×3 IMPLANT
BLADE ROTATE RAD 40 4 M4 (BLADE) IMPLANT
BLADE ROTATE RAD 40 4MM M4 (BLADE)
BLADE ROTATE TRICUT 4MX13CM M4 (BLADE)
BLADE ROTATE TRICUT 4X13 M4 (BLADE) IMPLANT
BLADE SURG 15 STRL LF DISP TIS (BLADE) ×1 IMPLANT
BLADE SURG 15 STRL SS (BLADE) ×2
CANISTER SUCTION 2500CC (MISCELLANEOUS) ×6 IMPLANT
CLOSURE WOUND 1/2 X4 (GAUZE/BANDAGES/DRESSINGS)
COAGULATOR SUCT 6 FR SWTCH (ELECTROSURGICAL) ×1
COAGULATOR SUCT SWTCH 10FR 6 (ELECTROSURGICAL) ×2 IMPLANT
CONT SPEC 4OZ CLIKSEAL STRL BL (MISCELLANEOUS) IMPLANT
CORDS BIPOLAR (ELECTRODE) IMPLANT
CRADLE DONUT ADULT HEAD (MISCELLANEOUS) ×3 IMPLANT
DRAPE PROXIMA HALF (DRAPES) IMPLANT
DRESSING TELFA 8X3 (GAUZE/BANDAGES/DRESSINGS) IMPLANT
DRSG NASOPORE 8CM (GAUZE/BANDAGES/DRESSINGS) ×3 IMPLANT
DURASEAL SPINE SEALANT 3ML (MISCELLANEOUS) IMPLANT
ELECT REM PT RETURN 9FT ADLT (ELECTROSURGICAL) ×3
ELECTRODE REM PT RTRN 9FT ADLT (ELECTROSURGICAL) ×1 IMPLANT
FILTER ARTHROSCOPY CONVERTOR (FILTER) ×6 IMPLANT
FLOSEAL 10ML (HEMOSTASIS) IMPLANT
GLOVE ECLIPSE 7.5 STRL STRAW (GLOVE) ×6 IMPLANT
GOWN BRE IMP SLV SIRUS LXLNG (GOWN DISPOSABLE) ×3 IMPLANT
GOWN STRL REUS W/ TWL LRG LVL3 (GOWN DISPOSABLE) ×1 IMPLANT
GOWN STRL REUS W/TWL LRG LVL3 (GOWN DISPOSABLE) ×2
KIT BASIN OR (CUSTOM PROCEDURE TRAY) ×3 IMPLANT
KIT ROOM TURNOVER OR (KITS) ×3 IMPLANT
NEEDLE HYPO 25GX1X1/2 BEV (NEEDLE) ×3 IMPLANT
NEEDLE SPNL 25GX3.5 QUINCKE BL (NEEDLE) IMPLANT
NS IRRIG 1000ML POUR BTL (IV SOLUTION) ×3 IMPLANT
PAD ARMBOARD 7.5X6 YLW CONV (MISCELLANEOUS) ×6 IMPLANT
PAD ENT ADHESIVE 25PK (MISCELLANEOUS) ×3 IMPLANT
PATTIES SURGICAL .5 X3 (DISPOSABLE) ×3 IMPLANT
PENCIL FOOT CONTROL (ELECTRODE) IMPLANT
SHEATH ENDOSCRUB 0 DEG (SHEATH) ×3 IMPLANT
SHEATH ENDOSCRUB 30 DEG (SHEATH) IMPLANT
SHEATH ENDOSCRUB 45 DEG (SHEATH) ×3 IMPLANT
SOLUTION ANTI FOG 6CC (MISCELLANEOUS) ×3 IMPLANT
SPECIMEN JAR SMALL (MISCELLANEOUS) ×6 IMPLANT
STRIP CLOSURE SKIN 1/2X4 (GAUZE/BANDAGES/DRESSINGS) IMPLANT
SUT ETHILON 3 0 PS 1 (SUTURE) IMPLANT
SWAB COLLECTION DEVICE MRSA (MISCELLANEOUS) IMPLANT
SYR 50ML SLIP (SYRINGE) IMPLANT
SYR CONTROL 10ML LL (SYRINGE) IMPLANT
TRACKER ENT INSTRUMENT (MISCELLANEOUS) ×3 IMPLANT
TRACKER ENT PATIENT (MISCELLANEOUS) ×3 IMPLANT
TRAY ENT MC OR (CUSTOM PROCEDURE TRAY) ×3 IMPLANT
TUBE CONNECTING 20'X1/4 (TUBING) ×1
TUBE CONNECTING 20X1/4 (TUBING) ×2 IMPLANT
TUBING STRAIGHTSHOT EPS 5PK (TUBING) ×3 IMPLANT
WATER STERILE IRR 1000ML POUR (IV SOLUTION) ×3 IMPLANT
WIPE INSTRUMENT VISIWIPE 73X73 (MISCELLANEOUS) ×3 IMPLANT

## 2015-10-28 NOTE — Transfer of Care (Signed)
Immediate Anesthesia Transfer of Care Note  Patient: Arthur [name].  Procedure(s) Performed: Procedure(s): ENDOSCOPIC SINUS SURGERY WITH NAVIGATION (Bilateral)  Patient Location: PACU  Anesthesia Type:General  Level of Consciousness: awake, alert  and oriented  Airway & Oxygen Therapy: Patient Spontanous Breathing and Patient connected to nasal cannula oxygen  Post-op Assessment: Report given to RN and Post -op Vital signs reviewed and stable  Post vital signs: Reviewed and stable  Last Vitals:  Filed Vitals:   10/28/15 0926 10/28/15 0929  BP: 173/94 143/73  Pulse: 71 71  Temp:    Resp: 17 12    Complications: No apparent anesthesia complications

## 2015-10-28 NOTE — Op Note (Signed)
Rehabilitation/postop diagnosis: Chronic sinusitis and nasal polyposis Procedure: Bilateral maxillary antrostomy, bilateral total ethmoidectomy, bilateral frontal sinusotomy, fusion computer guidance Anesthesia: Gen. Estimated blood loss: Approximately 50 mL Indications: 75 year old with a chronic sinusitis issue that has been refractory medical therapy. He has polyps and has previously had sinus surgery. He now has recurrence of the polyps and nasal obstruction and ready to proceed with surgery. He was informed risks and benefits of the procedure and options were discussed all questions were answered and consent was obtained. Operation: Patient was taken to the operating room placed in the supine position after general endotracheal tube anesthesia was placed in the supine position. Prepped and draped in the usual sterile manner. The fusion computer guidance system was positioned calibrated with excellent accuracy. The inferior turbinates, middle turbinates, and polyps were injected with 1% lidocaine with 1 100,000 epinephrine. The left side was begun removing the polyps within the nasal cavity which was a just at the middle turbinate. Middle turbinate was preserved. The ethmoids were opened with the polyps filling the ethmoid cavity removed with the microdebrider and fusion computer guidance. The antrostomy was opened widely. There was some thick mucus within the maxillary sinus. The nasal frontal duct was then opened with a curved guided fusion suction. This was opened up widely with the microdebrider and cannulated well. There was some polyps lateral to the middle turbinate or remove the microdebrider. Pledgets were placed. Right-sided was repeated in the same fashion but polyps are much worse and extensive within the nasal cavity. All looked watery polyp-like tissue and removed with a microdebrider. Guidance was used for the antrostomy total ethmoidectomy and nasofrontal duct opening all had polyp debris in  the areas. Results also polyps lateral to the middle turbinate remove the microdebrider. Suture placed. There was good hemostasis. The nasal pore was placed into both ethmoid cavities soaked in Bactroban. Pledgets were placed into the lateral aspect of the middle turbinate region and left to be removed in the recovery room. Her to be good hemostasis. He was then awakened brought to recovery room in stable condition counts correct

## 2015-10-28 NOTE — Anesthesia Preprocedure Evaluation (Addendum)
Anesthesia Evaluation  Patient identified by MRN, date of birth, ID band Patient awake    Reviewed: Allergy & Precautions, NPO status , Patient's Chart, lab work & pertinent test results, reviewed documented beta blocker date and time   History of Anesthesia Complications Negative for: history of anesthetic complications  Airway Mallampati: II  TM Distance: >3 FB Neck ROM: Full    Dental  (+) Dental Advisory Given, Edentulous Upper, Edentulous Lower, Upper Dentures, Lower Dentures   Pulmonary shortness of breath and with exertion, COPD, Current Smoker,    Pulmonary exam normal breath sounds clear to auscultation       Cardiovascular Exercise Tolerance: Good hypertension, Pt. on medications and Pt. on home beta blockers (-) angina(-) Past MI Normal cardiovascular exam+ dysrhythmias (chronic LBBB)  Rhythm:Regular Rate:Normal  Echo 03/06/14: Study Conclusions  - Left ventricle: The cavity size was normal. Wall thickness wasnormal. Systolic function was moderately reduced. The estimatedejection fraction was in the range of 35% to 40%. Severehypokinesis of the mid-apicalanteroseptal myocardium. Dopplerparameters are consistent with abnormal left ventricularrelaxation (grade 1 diastolic dysfunction). - Aortic valve: Valve area (VTI): 3.6 cm^2. Valve area (Vmax): 3.92cm^2. - Right atrium: The atrium was mildly dilated.  Non-ischemic cardiomyopathy   Neuro/Psych  Headaches, negative psych ROS   GI/Hepatic Neg liver ROS, GERD  ,  Endo/Other  Hypothyroidism   Renal/GU negative Renal ROS     Musculoskeletal  (+) Arthritis , Osteoarthritis,    Abdominal   Peds  Hematology negative hematology ROS (+)   Anesthesia Other Findings Day of surgery medications reviewed with the patient.  Reproductive/Obstetrics                           Anesthesia Physical Anesthesia Plan  ASA: III  Anesthesia Plan:  General   Post-op Pain Management:    Induction: Intravenous  Airway Management Planned: Oral ETT  Additional Equipment:   Intra-op Plan:   Post-operative Plan: Extubation in OR  Informed Consent: I have reviewed the patients History and Physical, chart, labs and discussed the procedure including the risks, benefits and alternatives for the proposed anesthesia with the patient or authorized representative who has indicated his/her understanding and acceptance.   Dental advisory given  Plan Discussed with: CRNA  Anesthesia Plan Comments: (Risks/benefits of general anesthesia discussed with patient including risk of damage to teeth, lips, gum, and tongue, nausea/vomiting, allergic reactions to medications, and the possibility of heart attack, stroke and death.  All patient questions answered.  Patient wishes to proceed.)        Anesthesia Quick Evaluation

## 2015-10-28 NOTE — Anesthesia Procedure Notes (Signed)
Procedure Name: Intubation Date/Time: 10/28/2015 8:30 AM Performed by: Tamala Fothergill S Patient Re-evaluated:Patient Re-evaluated prior to inductionOxygen Delivery Method: Circle system utilized Preoxygenation: Pre-oxygenation with 100% oxygen Intubation Type: IV induction Ventilation: Mask ventilation without difficulty Laryngoscope Size: Mac and 4 Grade View: Grade I Tube type: Oral Tube size: 8.0 mm Number of attempts: 1 Placement Confirmation: ETT inserted through vocal cords under direct vision,  breath sounds checked- equal and bilateral and positive ETCO2 Tube secured with: Tape Dental Injury: Teeth and Oropharynx as per pre-operative assessment
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
## 2015-10-28 NOTE — Anesthesia Postprocedure Evaluation (Signed)
Anesthesia Post Note  Patient: Arthur [name].  Procedure(s) Performed: Procedure(s) (LRB): ENDOSCOPIC SINUS SURGERY WITH NAVIGATION (Bilateral)  Patient location during evaluation: PACU Anesthesia Type: General Level of consciousness: awake and alert Pain management: pain level controlled Vital Signs Assessment: post-procedure vital signs reviewed and stable Respiratory status: spontaneous breathing, nonlabored ventilation and respiratory function stable Cardiovascular status: blood pressure returned to baseline and stable Postop Assessment: no signs of nausea or vomiting Anesthetic complications: no    Last Vitals:  Filed Vitals:   10/28/15 1056 10/28/15 1109  BP: 148/65 166/79  Pulse: 50 56  Temp: 36.4 C   Resp: 19 18    Last Pain:  Filed Vitals:   10/28/15 1139  PainSc: 3                  [name]

## 2015-10-29 ENCOUNTER — Encounter (HOSPITAL_COMMUNITY): Payer: Self-pay | Admitting: Otolaryngology

## 2015-11-06 ENCOUNTER — Other Ambulatory Visit: Payer: Self-pay | Admitting: Family Medicine

## 2015-11-11 DIAGNOSIS — J324 Chronic pansinusitis: Secondary | ICD-10-CM | POA: Diagnosis not present

## 2016-01-27 ENCOUNTER — Ambulatory Visit (INDEPENDENT_AMBULATORY_CARE_PROVIDER_SITE_OTHER): Payer: Medicare Other | Admitting: Emergency Medicine

## 2016-01-27 VITALS — BP 156/80 | HR 63 | Temp 97.9°F | Resp 16 | Ht 72.0 in | Wt 214.0 lb

## 2016-01-27 DIAGNOSIS — F17200 Nicotine dependence, unspecified, uncomplicated: Secondary | ICD-10-CM

## 2016-01-27 DIAGNOSIS — E038 Other specified hypothyroidism: Secondary | ICD-10-CM

## 2016-01-27 DIAGNOSIS — Z87898 Personal history of other specified conditions: Secondary | ICD-10-CM | POA: Diagnosis not present

## 2016-01-27 DIAGNOSIS — R270 Ataxia, unspecified: Secondary | ICD-10-CM | POA: Diagnosis not present

## 2016-01-27 DIAGNOSIS — R61 Generalized hyperhidrosis: Secondary | ICD-10-CM | POA: Diagnosis not present

## 2016-01-27 DIAGNOSIS — Z720 Tobacco use: Secondary | ICD-10-CM

## 2016-01-27 DIAGNOSIS — R2990 Unspecified symptoms and signs involving the nervous system: Secondary | ICD-10-CM | POA: Diagnosis not present

## 2016-01-27 DIAGNOSIS — L819 Disorder of pigmentation, unspecified: Secondary | ICD-10-CM

## 2016-01-27 LAB — COMPLETE METABOLIC PANEL WITH GFR
ALT: 24 U/L (ref 9–46)
AST: 20 U/L (ref 10–35)
Albumin: 3.8 g/dL (ref 3.6–5.1)
Alkaline Phosphatase: 79 U/L (ref 40–115)
BILIRUBIN TOTAL: 0.5 mg/dL (ref 0.2–1.2)
BUN: 15 mg/dL (ref 7–25)
CHLORIDE: 102 mmol/L (ref 98–110)
CO2: 27 mmol/L (ref 20–31)
Calcium: 9.3 mg/dL (ref 8.6–10.3)
Creat: 0.82 mg/dL (ref 0.70–1.18)
GFR, Est African American: >89 mL/min (ref >=60)
GFR, Est Non African American: 88 mL/min (ref >=60)
GLUCOSE: 101 mg/dL — AB (ref 65–99)
Potassium: 4.4 mmol/L (ref 3.5–5.3)
SODIUM: 141 mmol/L (ref 135–146)
TOTAL PROTEIN: 6.7 g/dL (ref 6.1–8.1)

## 2016-01-27 LAB — TSH: TSH: 0.72 mIU/L (ref 0.40–4.50)

## 2016-01-27 LAB — POCT CBC
Granulocyte percent: 57.9 %G (ref 37–80)
HCT, POC: 42 % — AB (ref 43.5–53.7)
HEMOGLOBIN: 14.6 g/dL (ref 14.1–18.1)
LYMPH, POC: 2.3 (ref 0.6–3.4)
MCH: 30.2 pg (ref 27–31.2)
MCHC: 34.8 g/dL (ref 31.8–35.4)
MCV: 86.8 fL (ref 80–97)
MID (cbc): 0.7 (ref 0–0.9)
MPV: 8.8 fL (ref 0–99.8)
POC GRANULOCYTE: 4.1 (ref 2–6.9)
POC LYMPH PERCENT: 32.3 %L (ref 10–50)
POC MID %: 9.8 %M (ref 0–12)
Platelet Count, POC: 186 10*3/uL (ref 142–424)
RBC: 4.84 M/uL (ref 4.69–6.13)
RDW, POC: 13.2 %
WBC: 7.1 10*3/uL (ref 4.6–10.2)
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
## 2016-01-27 NOTE — Patient Instructions (Signed)
     IF you received an x-ray today, you will receive an invoice from [facility]. Please contact [facility] at [phone] with questions or concerns regarding your invoice.   IF you received labwork today, you will receive an invoice from [facility]. Please contact [facility] at [phone] with questions or concerns regarding your invoice.   Our billing staff will not be able to assist you with questions regarding bills from these companies.  You will be contacted with the lab results as soon as they are available. The fastest way to get your results is to activate your My Chart account. Instructions are located on the last page of this paperwork. If you have not heard from us regarding the results in 2 weeks, please contact this office.      

## 2016-02-01 ENCOUNTER — Other Ambulatory Visit: Payer: Self-pay | Admitting: Family Medicine

## 2016-02-04 ENCOUNTER — Other Ambulatory Visit: Payer: Self-pay | Admitting: Emergency Medicine

## 2016-02-04 ENCOUNTER — Ambulatory Visit
Admission: RE | Admit: 2016-02-04 | Discharge: 2016-02-04 | Disposition: A | Discharge status: Home / Self Care | Payer: Medicare Other | Source: Ambulatory Visit | Attending: Emergency Medicine | Admitting: Emergency Medicine

## 2016-02-04 DIAGNOSIS — R2990 Unspecified symptoms and signs involving the nervous system: Secondary | ICD-10-CM

## 2016-02-04 DIAGNOSIS — L219 Seborrheic dermatitis, unspecified: Secondary | ICD-10-CM | POA: Diagnosis not present

## 2016-02-04 DIAGNOSIS — B079 Viral wart, unspecified: Secondary | ICD-10-CM | POA: Diagnosis not present

## 2016-02-04 DIAGNOSIS — B078 Other viral warts: Secondary | ICD-10-CM | POA: Diagnosis not present

## 2016-02-04 DIAGNOSIS — R2689 Other abnormalities of gait and mobility: Secondary | ICD-10-CM | POA: Diagnosis not present

## 2016-02-04 DIAGNOSIS — R270 Ataxia, unspecified: Secondary | ICD-10-CM

## 2016-02-04 DIAGNOSIS — D485 Neoplasm of uncertain behavior of skin: Secondary | ICD-10-CM | POA: Diagnosis not present

## 2016-02-04 DIAGNOSIS — Z87898 Personal history of other specified conditions: Secondary | ICD-10-CM

## 2016-02-19 DIAGNOSIS — R42 Dizziness and giddiness: Secondary | ICD-10-CM | POA: Diagnosis not present

## 2016-02-19 DIAGNOSIS — J309 Allergic rhinitis, unspecified: Secondary | ICD-10-CM | POA: Diagnosis not present

## 2016-02-26 ENCOUNTER — Ambulatory Visit (INDEPENDENT_AMBULATORY_CARE_PROVIDER_SITE_OTHER): Payer: Medicare Other

## 2016-02-26 ENCOUNTER — Ambulatory Visit (INDEPENDENT_AMBULATORY_CARE_PROVIDER_SITE_OTHER): Payer: Medicare Other | Admitting: Family Medicine

## 2016-02-26 VITALS — BP 167/73 | HR 65 | Temp 98.1°F | Resp 20 | Ht 72.0 in | Wt 216.0 lb

## 2016-02-26 DIAGNOSIS — R059 Cough, unspecified: Secondary | ICD-10-CM

## 2016-02-26 DIAGNOSIS — J9801 Acute bronchospasm: Secondary | ICD-10-CM

## 2016-02-26 DIAGNOSIS — R05 Cough: Secondary | ICD-10-CM

## 2016-02-26 DIAGNOSIS — R0600 Dyspnea, unspecified: Secondary | ICD-10-CM

## 2016-02-26 DIAGNOSIS — R0789 Other chest pain: Secondary | ICD-10-CM

## 2016-02-26 DIAGNOSIS — J209 Acute bronchitis, unspecified: Secondary | ICD-10-CM

## 2016-02-26 DIAGNOSIS — J40 Bronchitis, not specified as acute or chronic: Secondary | ICD-10-CM | POA: Diagnosis not present

## 2016-02-26 LAB — POCT CBC
Granulocyte percent: 62.3 %G (ref 37–80)
HCT, POC: 40.2 % — AB (ref 43.5–53.7)
HEMOGLOBIN: 14.1 g/dL (ref 14.1–18.1)
LYMPH, POC: 2.1 (ref 0.6–3.4)
MCH, POC: 30.4 pg (ref 27–31.2)
MCHC: 35.1 g/dL (ref 31.8–35.4)
MCV: 86.8 fL (ref 80–97)
MID (cbc): 0.5 (ref 0–0.9)
MPV: 8.9 fL (ref 0–99.8)
POC Granulocyte: 4.4 (ref 2–6.9)
POC LYMPH PERCENT: 30 %L (ref 10–50)
POC MID %: 7.7 %M (ref 0–12)
Platelet Count, POC: 163 10*3/uL (ref 142–424)
RBC: 4.63 M/uL — AB (ref 4.69–6.13)
RDW, POC: 13.3 %
WBC: 7 10*3/uL (ref 4.6–10.2)

## 2016-02-26 MED ORDER — ALBUTEROL SULFATE HFA 108 (90 BASE) MCG/ACT IN AERS: 1.0000 | INHALATION_SPRAY | RESPIRATORY_TRACT | Status: DC | PRN

## 2016-02-26 MED ORDER — IPRATROPIUM BROMIDE 0.02 % IN SOLN
0.5000 mg | Freq: Once | RESPIRATORY_TRACT | Status: AC
  Administered 2016-02-26: 0.5 mg via RESPIRATORY_TRACT

## 2016-02-26 MED ORDER — AZITHROMYCIN 250 MG PO TABS: ORAL_TABLET | ORAL | Status: DC

## 2016-02-26 MED ORDER — BENZONATATE 100 MG PO CAPS: 100.0000 mg | ORAL_CAPSULE | Freq: Three times a day (TID) | ORAL | Status: DC | PRN

## 2016-02-26 MED ORDER — ALBUTEROL SULFATE (2.5 MG/3ML) 0.083% IN NEBU
2.5000 mg | INHALATION_SOLUTION | Freq: Once | RESPIRATORY_TRACT | Status: AC
  Administered 2016-02-26: 2.5 mg via RESPIRATORY_TRACT

## 2016-02-26 NOTE — Progress Notes (Signed)
Subjective:    Patient ID: Arthur [name]., male    DOB: [birth year], 75 y.o.   MRN: [id number]  HPI Arthur [name]. is a 75 y.o. male Multiple problems including emphysema, nonischemic cardio myopathy, left bundle branch block, hypertension, GERD.  presents with cough, nausea, fatigue and chest pain with coughing.  Yesterday with runny nose, cough, sneezing. persistent cough. No known fever. Cough productive of white sputum. No hemoptysis.  Feels short of breath, but "always short of breath".  Somewhat worse since having cold.  Sore in chest with frequent coughing - feels like somebody standing on chest - sore/ribs sore.  Min nausea - occasionally with chest soreness. Also with night sweats past few days. Smoker - less than 1 pack per week. No radiation of chest pain to arm. Cardiologist : Johnsie Cancel. Chest pain with cough has been there past few days.   No known sick contacts.   Tx: alks seltzer plus. Not on inhalers.    Review of Systems  Constitutional: Positive for diaphoresis and fatigue.  HENT: Positive for congestion, rhinorrhea and sneezing.   Respiratory: Positive for cough, chest tightness and shortness of breath.   Cardiovascular: Positive for chest pain. Negative for palpitations and leg swelling.       Objective:   Physical Exam  Constitutional: He is oriented to person, place, and time. He appears well-developed and well-nourished.  HENT:  Head: Normocephalic and atraumatic.  Right Ear: Tympanic membrane, external ear and ear canal normal.  Left Ear: Tympanic membrane, external ear and ear canal normal.  Nose: No rhinorrhea.  Mouth/Throat: Oropharynx is clear and moist and mucous membranes are normal. No oropharyngeal exudate or posterior oropharyngeal erythema.  Eyes: Conjunctivae and EOM are normal. Pupils are equal, round, and reactive to light.  Neck: Neck supple. No JVD present. Carotid bruit is not present.  Cardiovascular: Normal rate, regular rhythm,  normal heart sounds and intact distal pulses.   No murmur heard. Pulmonary/Chest: Effort normal. No respiratory distress. He has wheezes. He has rhonchi (coarse bs RLL, increased exp phase, end expiratory wheeze. ). He has no rales. He exhibits tenderness and bony tenderness. He exhibits no deformity and no retraction.    Abdominal: Soft. There is no tenderness.  Musculoskeletal: He exhibits no edema.  Lymphadenopathy:    He has no cervical adenopathy.  Neurological: He is alert and oriented to person, place, and time.  Skin: Skin is warm and dry. No rash noted.  Psychiatric: He has a normal mood and affect. His behavior is normal.  Vitals reviewed.  Filed Vitals:   02/26/16 1341  BP: 167/73  Pulse: 65  Temp: 98.1 F (36.7 C)  TempSrc: Oral  Resp: 20  Height: 6' (1.829 m)  Weight: 216 lb (97.977 kg)  SpO2: 94%    EKG: First-degree AV block, left bundle branch block, sinus rhythm with rate of 60. No apparent changes from previous EKG.  Results for orders placed or performed in visit on 02/26/16  POCT CBC  Result Value Ref Range   WBC 7.0 4.6 - 10.2 K/uL   Lymph, poc 2.1 0.6 - 3.4   POC LYMPH PERCENT 30.0 10 - 50 %L   MID (cbc) 0.5 0 - 0.9   POC MID % 7.7 0 - 12 %M   POC Granulocyte 4.4 2 - 6.9   Granulocyte percent 62.3 37 - 80 %G   RBC 4.63 (A) 4.69 - 6.13 M/uL   Hemoglobin 14.1 14.1 - 18.1 g/dL  HCT, POC 40.2 (A) 43.5 - 53.7 %   MCV 86.8 80 - 97 fL   MCH, POC 30.4 27 - 31.2 pg   MCHC 35.1 31.8 - 35.4 g/dL   RDW, POC 13.3 %   Platelet Count, POC 163 142 - 424 K/uL   MPV 8.9 0 - 99.8 fL   Dg Chest 2 View  02/26/2016  CLINICAL DATA:  Cough.  Dyspnea. EXAM: CHEST  2 VIEW COMPARISON:  Previous relevant examinations. FINDINGS: Normal sized heart. Clear lungs. Mild central peribronchial thickening with mild progression since 12/26/2014. Cervical spine fixation hardware. Bilateral thoracic inlet surgical clips. IMPRESSION: Mild to moderate bronchitic changes with mild  progression. Electronically Signed   By: Claudie Revering M.D.   On: 02/26/2016 15:16   Albuterol and Atrovent nebs given. Some improvement in symptoms, less cough, decreased wheeze.    Assessment & Plan:   Arthur [name]. is a 75 y.o. male Cough - Plan: EKG 12-Lead, POCT CBC, DG Chest 2 View, albuterol (PROVENTIL) (2.5 MG/3ML) 0.083% nebulizer solution 2.5 mg, ipratropium (ATROVENT) nebulizer solution 0.5 mg, benzonatate (TESSALON) 100 MG capsule  Dyspnea - Plan: EKG 12-Lead, POCT CBC, DG Chest 2 View  Chest wall pain - Plan: EKG 12-Lead  Bronchospasm - Plan: albuterol (PROVENTIL) (2.5 MG/3ML) 0.083% nebulizer solution 2.5 mg, ipratropium (ATROVENT) nebulizer solution 0.5 mg  Bronchitis with bronchospasm - Plan: albuterol (PROVENTIL HFA;VENTOLIN HFA) 108 (90 Base) MCG/ACT inhaler, azithromycin (ZITHROMAX) 250 MG tablet   Suspected acute on chronic bronchitis with flare. Reassuring CBC. No acute infiltrate seen on x-ray. However with production of sputum without previous productive sputum, will start azithromycin, albuterol up to every 4 hours as needed, recheck in the next 48-72 hours if not improving, ER/RTC precautions discussed.   Based on his description of chest pain appears to be chest wall as well as being reproducible on exam. However with his previous history, advised if any increased symptoms, worsening shortness of breath, or otherwise worsening, proceed to the emergency room for other testing. Understanding expressed.  Tessalon Perles given if needed for cough, Tylenol if needed for chest wall pain.   Meds ordered this encounter  Medications  . carvedilol (COREG) 6.25 MG tablet    Sig: Take 6.25 mg by mouth.  [name] albuterol (PROVENTIL) (2.5 MG/3ML) 0.083% nebulizer solution 2.5 mg    Sig:   . ipratropium (ATROVENT) nebulizer solution 0.5 mg    Sig:   . albuterol (PROVENTIL HFA;VENTOLIN HFA) 108 (90 Base) MCG/ACT inhaler    Sig: Inhale 1-2 puffs into the lungs every 4 (four)  hours as needed for wheezing or shortness of breath.    Dispense:  1 Inhaler    Refill:  0  . azithromycin (ZITHROMAX) 250 MG tablet    Sig: Take 2 pills by mouth on day 1, then 1 pill by mouth per day on days 2 through 5.    Dispense:  6 tablet    Refill:  0  . benzonatate (TESSALON) 100 MG capsule    Sig: Take 1 capsule (100 mg total) by mouth 3 (three) times daily as needed for cough.    Dispense:  20 capsule    Refill:  0   Patient Instructions       IF you received an x-ray today, you will receive an invoice from [facility]. Please contact [facility] at [phone] with questions or concerns regarding your invoice.   IF you received labwork today, you will receive an invoice from [facility]. Please contact [facility] at [phone] with questions or concerns regarding your invoice.   Our billing staff will not be able to assist you with questions regarding bills from these companies.  You will be contacted with the lab results as soon as they are available. The fastest way to get your results is to activate your My Chart account. Instructions are located on the last page of this paperwork. If you have not heard from [place] regarding the results in 2 weeks, please contact this office.    You likely have a flare of bronchitis or emphysema, with some possible secondary infection. Your blood count looks reassuring, and no apparent pneumonia on chest x-ray, but signs of bronchitis that were a little bit worse.   Start antibiotic today, albuterol inhaler up to every 4 hours if needed. If you are requiring the inhaler frequently over the next 2-3 days, or more than 2-3 times per day, return in the next 2-3 days for recheck. If you are more short of breath, or any worsening of symptoms prior to that time, go to the emergency room.  Your chest wall pain appears to not be due to your heart. Can try over-the-counter Tylenol as needed for the  soreness, and I will send in some cough pills he can take up to 3 times per day. However if wheezing or short of breath, see information above regarding use of albuterol. I do not see any changes in your EKG. However if any worsening chest pain, worsening shortness of breath, or other worsening symptoms, recommend you be evaluated through the emergency room.  Return to the clinic or go to the nearest emergency room if any of your symptoms worsen or new symptoms occur.   Acute Bronchitis Bronchitis is inflammation of the airways that extend from the windpipe into the lungs (bronchi). The inflammation often causes mucus to develop. This leads to a cough, which is the most common symptom of bronchitis.  In acute bronchitis, the condition usually develops suddenly and goes away over time, usually in a couple weeks. Smoking, allergies, and asthma can make bronchitis worse. Repeated episodes of bronchitis may cause further lung problems.  CAUSES Acute bronchitis is most often caused by the same virus that causes a cold. The virus can spread from person to person (contagious) through coughing, sneezing, and touching contaminated objects. SIGNS AND SYMPTOMS   Cough.   Fever.   Coughing up mucus.   Body aches.   Chest congestion.   Chills.   Shortness of breath.   Sore throat.  DIAGNOSIS  Acute bronchitis is usually diagnosed through a physical exam. Your health care provider will also ask you questions about your medical history. Tests, such as chest X-rays, are sometimes done to rule out other conditions.  TREATMENT  Acute bronchitis usually goes away in a couple weeks. Oftentimes, no medical treatment is necessary. Medicines are sometimes given for relief of fever or cough. Antibiotic medicines are usually not needed but may be prescribed in certain situations. In some cases, an inhaler may be recommended to help reduce shortness of breath and control the cough. A cool mist vaporizer  may also be used to help thin bronchial secretions and make it easier to clear the chest.  HOME CARE INSTRUCTIONS  Get plenty of rest.   Drink enough fluids to keep your urine clear or pale yellow (unless you have a medical condition that requires fluid restriction). Increasing fluids may help thin your respiratory secretions (sputum) and reduce  chest congestion, and it will prevent dehydration.   Take medicines only as directed by your health care provider.  If you were prescribed an antibiotic medicine, finish it all even if you start to feel better.  Avoid smoking and secondhand smoke. Exposure to cigarette smoke or irritating chemicals will make bronchitis worse. If you are a smoker, consider using nicotine gum or skin patches to help control withdrawal symptoms. Quitting smoking will help your lungs heal faster.   Reduce the chances of another bout of acute bronchitis by washing your hands frequently, avoiding people with cold symptoms, and trying not to touch your hands to your mouth, nose, or eyes.   Keep all follow-up visits as directed by your health care provider.  SEEK MEDICAL CARE IF: Your symptoms do not improve after 1 week of treatment.  SEEK IMMEDIATE MEDICAL CARE IF:  You develop an increased fever or chills.   You have chest pain.   You have severe shortness of breath.  You have bloody sputum.   You develop dehydration.  You faint or repeatedly feel like you are going to pass out.  You develop repeated vomiting.  You develop a severe headache. MAKE SURE YOU:   Understand these instructions.  Will watch your condition.  Will get help right away if you are not doing well or get worse.   This information is not intended to replace advice given to you by your health care provider. Make sure you discuss any questions you have with your health care provider.   Document Released: 10/27/2004 Document Revised: 10/10/2014 Document Reviewed:  03/12/2013 Elsevier Interactive Patient Education 2016 Elsevier Inc[employer]  Bronchospasm, Adult A bronchospasm is when the tubes that carry air in and out of your lungs (airways) spasm or tighten. During a bronchospasm it is hard to breathe. This is because the airways get smaller. A bronchospasm can be triggered by:  Allergies. These may be to animals, pollen, food, or mold.  Infection. This is a common cause of bronchospasm.  Exercise.  Irritants. These include pollution, cigarette smoke, strong odors, aerosol sprays, and paint fumes.  Weather changes.  Stress.  Being emotional. HOME CARE   Always have a plan for getting help. Know when to call your doctor and local emergency services (911 in the [place] Know where you can get emergency care.  Only take medicines as told by your doctor.  If you were prescribed an inhaler or nebulizer machine, ask your doctor how to use it correctly. Always use a spacer with your inhaler if you were given one.  Stay calm during an attack. Try to relax and breathe more slowly.  Control your home environment:  Change your heating and air conditioning filter at least once a month.  Limit your use of fireplaces and wood stoves.  Do not  smoke. Do not  allow smoking in your home.  Avoid perfumes and fragrances.  Get rid of pests (such as roaches and mice) and their droppings.  Throw away plants if you see mold on them.  Keep your house clean and dust free.  Replace carpet with wood, tile, or vinyl flooring. Carpet can trap dander and dust.  Use allergy-proof pillows, mattress covers, and box spring covers.  Wash bed sheets and blankets every week in hot water. Dry them in a dryer.  Use blankets that are made of polyester or cotton.  Wash hands frequently. GET HELP IF:  You have muscle aches.  You have chest pain.  The thick spit  you spit or cough up (sputum) changes from clear or white to yellow, green, gray, or bloody.  The thick  spit you spit or cough up gets thicker.  There are problems that may be related to the medicine you are given such as:  A rash.  Itching.  Swelling.  Trouble breathing. GET HELP RIGHT AWAY IF:  You feel you cannot breathe or catch your breath.  You cannot stop coughing.  Your treatment is not helping you breathe better.  You have very bad chest pain. MAKE SURE YOU:   Understand these instructions.  Will watch your condition.  Will get help right away if you are not doing well or get worse.   This information is not intended to replace advice given to you by your health care provider. Make sure you discuss any questions you have with your health care provider.   Document Released: 07/17/2009 Document Revised: 10/10/2014 Document Reviewed: 03/12/2013 Elsevier Interactive Patient Education 2016 Elsevier Inc.  Chest Wall Pain Chest wall pain is pain in or around the bones and muscles of your chest. Sometimes, an injury causes this pain. Sometimes, the cause may not be known. This pain may take several weeks or longer to get better. HOME CARE INSTRUCTIONS  Pay attention to any changes in your symptoms. Take these actions to help with your pain:   Rest as told by your health care provider.   Avoid activities that cause pain. These include any activities that use your chest muscles or your abdominal and side muscles to lift heavy items.   If directed, apply ice to the painful area:  Put ice in a plastic bag.  Place a towel between your skin and the bag.  Leave the ice on for 20 minutes, 2-3 times per day.  Take over-the-counter and prescription medicines only as told by your health care provider.  Do not use tobacco products, including cigarettes, chewing tobacco, and e-cigarettes. If you need help quitting, ask your health care provider.  Keep all follow-up visits as told by your health care provider. This is important. SEEK MEDICAL CARE IF:  You have a  fever.  Your chest pain becomes worse.  You have new symptoms. SEEK IMMEDIATE MEDICAL CARE IF:  You have nausea or vomiting.  You feel sweaty or light-headed.  You have a cough with phlegm (sputum) or you cough up blood.  You develop shortness of breath.   This information is not intended to replace advice given to you by your health care provider. Make sure you discuss any questions you have with your health care provider.   Document Released: 09/19/2005 Document Revised: 06/10/2015 Document Reviewed: 12/15/2014 Elsevier Interactive Patient Education [employer].     I personally performed the services described in this documentation, which was scribed in my presence. The recorded information has been reviewed and considered, and addended by me as needed.

## 2016-02-26 NOTE — Patient Instructions (Addendum)
IF you received an x-ray today, you will receive an invoice from [facility]. Please contact [facility] at [phone] with questions or concerns regarding your invoice.   IF you received labwork today, you will receive an invoice from [employer]. Please contact [facility] at [phone] with questions or concerns regarding your invoice.   Our billing staff will not be able to assist you with questions regarding bills from these companies.  You will be contacted with the lab results as soon as they are available. The fastest way to get your results is to activate your My Chart account. Instructions are located on the last page of this paperwork. If you have not heard from [place] regarding the results in 2 weeks, please contact this office.    You likely have a flare of bronchitis or emphysema, with some possible secondary infection. Your blood count looks reassuring, and no apparent pneumonia on chest x-ray, but signs of bronchitis that were a little bit worse.   Start antibiotic today, albuterol inhaler up to every 4 hours if needed. If you are requiring the inhaler frequently over the next 2-3 days, or more than 2-3 times per day, return in the next 2-3 days for recheck. If you are more short of breath, or any worsening of symptoms prior to that time, go to the emergency room.  Your chest wall pain appears to not be due to your heart. Can try over-the-counter Tylenol as needed for the soreness, and I will send in some cough pills he can take up to 3 times per day. However if wheezing or short of breath, see information above regarding use of albuterol. I do not see any changes in your EKG. However if any worsening chest pain, worsening shortness of breath, or other worsening symptoms, recommend you be evaluated through the emergency room.  Return to the clinic or go to the nearest emergency room if any of your symptoms worsen or new symptoms  occur.   Acute Bronchitis Bronchitis is inflammation of the airways that extend from the windpipe into the lungs (bronchi). The inflammation often causes mucus to develop. This leads to a cough, which is the most common symptom of bronchitis.  In acute bronchitis, the condition usually develops suddenly and goes away over time, usually in a couple weeks. Smoking, allergies, and asthma can make bronchitis worse. Repeated episodes of bronchitis may cause further lung problems.  CAUSES Acute bronchitis is most often caused by the same virus that causes a cold. The virus can spread from person to person (contagious) through coughing, sneezing, and touching contaminated objects. SIGNS AND SYMPTOMS   Cough.   Fever.   Coughing up mucus.   Body aches.   Chest congestion.   Chills.   Shortness of breath.   Sore throat.  DIAGNOSIS  Acute bronchitis is usually diagnosed through a physical exam. Your health care provider will also ask you questions about your medical history. Tests, such as chest X-rays, are sometimes done to rule out other conditions.  TREATMENT  Acute bronchitis usually goes away in a couple weeks. Oftentimes, no medical treatment is necessary. Medicines are sometimes given for relief of fever or cough. Antibiotic medicines are usually not needed but may be prescribed in certain situations. In some cases, an inhaler may be recommended to help reduce shortness of breath and control the cough. A cool mist vaporizer may also be used to help thin bronchial secretions and make it easier to clear the  chest.  HOME CARE INSTRUCTIONS  Get plenty of rest.   Drink enough fluids to keep your urine clear or pale yellow (unless you have a medical condition that requires fluid restriction). Increasing fluids may help thin your respiratory secretions (sputum) and reduce chest congestion, and it will prevent dehydration.   Take medicines only as directed by your health care  provider.  If you were prescribed an antibiotic medicine, finish it all even if you start to feel better.  Avoid smoking and secondhand smoke. Exposure to cigarette smoke or irritating chemicals will make bronchitis worse. If you are a smoker, consider using nicotine gum or skin patches to help control withdrawal symptoms. Quitting smoking will help your lungs heal faster.   Reduce the chances of another bout of acute bronchitis by washing your hands frequently, avoiding people with cold symptoms, and trying not to touch your hands to your mouth, nose, or eyes.   Keep all follow-up visits as directed by your health care provider.  SEEK MEDICAL CARE IF: Your symptoms do not improve after 1 week of treatment.  SEEK IMMEDIATE MEDICAL CARE IF:  You develop an increased fever or chills.   You have chest pain.   You have severe shortness of breath.  You have bloody sputum.   You develop dehydration.  You faint or repeatedly feel like you are going to pass out.  You develop repeated vomiting.  You develop a severe headache. MAKE SURE YOU:   Understand these instructions.  Will watch your condition.  Will get help right away if you are not doing well or get worse.   This information is not intended to replace advice given to you by your health care provider. Make sure you discuss any questions you have with your health care provider.   Document Released: 10/27/2004 Document Revised: 10/10/2014 Document Reviewed: 03/12/2013 Elsevier Interactive Patient Education 2016 Elsevier Inc.  Bronchospasm, Adult A bronchospasm is when the tubes that carry air in and out of your lungs (airways) spasm or tighten. During a bronchospasm it is hard to breathe. This is because the airways get smaller. A bronchospasm can be triggered by:  Allergies. These may be to animals, pollen, food, or mold.  Infection. This is a common cause of bronchospasm.  Exercise.  Irritants. These include  pollution, cigarette smoke, strong odors, aerosol sprays, and paint fumes.  Weather changes.  Stress.  Being emotional. HOME CARE   Always have a plan for getting help. Know when to call your doctor and local emergency services (911 in the [place] Know where you can get emergency care.  Only take medicines as told by your doctor.  If you were prescribed an inhaler or nebulizer machine, ask your doctor how to use it correctly. Always use a spacer with your inhaler if you were given one.  Stay calm during an attack. Try to relax and breathe more slowly.  Control your home environment:  Change your heating and air conditioning filter at least once a month.  Limit your use of fireplaces and wood stoves.  Do not  smoke. Do not  allow smoking in your home.  Avoid perfumes and fragrances.  Get rid of pests (such as roaches and mice) and their droppings.  Throw away plants if you see mold on them.  Keep your house clean and dust free.  Replace carpet with wood, tile, or vinyl flooring. Carpet can trap dander and dust.  Use allergy-proof pillows, mattress covers, and box spring covers.  Wash  bed sheets and blankets every week in hot water. Dry them in a dryer.  Use blankets that are made of polyester or cotton.  Wash hands frequently. GET HELP IF:  You have muscle aches.  You have chest pain.  The thick spit you spit or cough up (sputum) changes from clear or white to yellow, green, gray, or bloody.  The thick spit you spit or cough up gets thicker.  There are problems that may be related to the medicine you are given such as:  A rash.  Itching.  Swelling.  Trouble breathing. GET HELP RIGHT AWAY IF:  You feel you cannot breathe or catch your breath.  You cannot stop coughing.  Your treatment is not helping you breathe better.  You have very bad chest pain. MAKE SURE YOU:   Understand these instructions.  Will watch your condition.  Will get help right  away if you are not doing well or get worse.   This information is not intended to replace advice given to you by your health care provider. Make sure you discuss any questions you have with your health care provider.   Document Released: 07/17/2009 Document Revised: 10/10/2014 Document Reviewed: 03/12/2013 Elsevier Interactive Patient Education 2016 Elsevier Inc.  Chest Wall Pain Chest wall pain is pain in or around the bones and muscles of your chest. Sometimes, an injury causes this pain. Sometimes, the cause may not be known. This pain may take several weeks or longer to get better. HOME CARE INSTRUCTIONS  Pay attention to any changes in your symptoms. Take these actions to help with your pain:   Rest as told by your health care provider.   Avoid activities that cause pain. These include any activities that use your chest muscles or your abdominal and side muscles to lift heavy items.   If directed, apply ice to the painful area:  Put ice in a plastic bag.  Place a towel between your skin and the bag.  Leave the ice on for 20 minutes, 2-3 times per day.  Take over-the-counter and prescription medicines only as told by your health care provider.  Do not use tobacco products, including cigarettes, chewing tobacco, and e-cigarettes. If you need help quitting, ask your health care provider.  Keep all follow-up visits as told by your health care provider. This is important. SEEK MEDICAL CARE IF:  You have a fever.  Your chest pain becomes worse.  You have new symptoms. SEEK IMMEDIATE MEDICAL CARE IF:  You have nausea or vomiting.  You feel sweaty or light-headed.  You have a cough with phlegm (sputum) or you cough up blood.  You develop shortness of breath.   This information is not intended to replace advice given to you by your health care provider. Make sure you discuss any questions you have with your health care provider.   Document Released: 09/19/2005  Document Revised: 06/10/2015 Document Reviewed: 12/15/2014 Elsevier Interactive Patient Education [employer].

## 2016-03-01 ENCOUNTER — Ambulatory Visit (INDEPENDENT_AMBULATORY_CARE_PROVIDER_SITE_OTHER): Payer: Medicare Other | Admitting: Family Medicine

## 2016-03-01 VITALS — BP 112/74 | HR 66 | Temp 97.6°F | Resp 18 | Ht 72.0 in | Wt 212.6 lb

## 2016-03-01 DIAGNOSIS — J42 Unspecified chronic bronchitis: Secondary | ICD-10-CM | POA: Diagnosis not present

## 2016-03-01 DIAGNOSIS — R05 Cough: Secondary | ICD-10-CM

## 2016-03-01 DIAGNOSIS — Z79899 Other long term (current) drug therapy: Secondary | ICD-10-CM | POA: Diagnosis not present

## 2016-03-01 DIAGNOSIS — J209 Acute bronchitis, unspecified: Secondary | ICD-10-CM

## 2016-03-01 DIAGNOSIS — R61 Generalized hyperhidrosis: Secondary | ICD-10-CM | POA: Diagnosis not present

## 2016-03-01 DIAGNOSIS — R0789 Other chest pain: Secondary | ICD-10-CM | POA: Diagnosis not present

## 2016-03-01 DIAGNOSIS — R059 Cough, unspecified: Secondary | ICD-10-CM

## 2016-03-01 LAB — POCT CBC
Granulocyte percent: 57.6 %G (ref 37–80)
HCT, POC: 40.5 % — AB (ref 43.5–53.7)
HEMOGLOBIN: 14.2 g/dL (ref 14.1–18.1)
Lymph, poc: 3 (ref 0.6–3.4)
MCH, POC: 30.1 pg (ref 27–31.2)
MCHC: 35 g/dL (ref 31.8–35.4)
MCV: 86.1 fL (ref 80–97)
MID (cbc): 0.8 (ref 0–0.9)
MPV: 8.6 fL (ref 0–99.8)
POC Granulocyte: 5.1 (ref 2–6.9)
POC LYMPH PERCENT: 33.6 %L (ref 10–50)
POC MID %: 8.8 %M (ref 0–12)
Platelet Count, POC: 186 10*3/uL (ref 142–424)
RBC: 4.71 M/uL (ref 4.69–6.13)
RDW, POC: 13.2 %
WBC: 8.9 10*3/uL (ref 4.6–10.2)

## 2016-03-01 LAB — GLUCOSE, POCT (MANUAL RESULT ENTRY): POC GLUCOSE: 88 mg/dL (ref 70–99)

## 2016-03-01 MED ORDER — PREDNISONE 20 MG PO TABS: 40.0000 mg | ORAL_TABLET | Freq: Every day | ORAL | Status: DC
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
## 2016-03-01 NOTE — Patient Instructions (Addendum)
     IF you received an x-ray today, you will receive an invoice from [facility]. Please contact [facility] at [phone] with questions or concerns regarding your invoice.   IF you received labwork today, you will receive an invoice from [employer]. Please contact [facility] at [phone] with questions or concerns regarding your invoice.   Our billing staff will not be able to assist you with questions regarding bills from these companies.  You will be contacted with the lab results as soon as they are available. The fastest way to get your results is to activate your My Chart account. Instructions are located on the last page of this paperwork. If you have not heard from [place] regarding the results in 2 weeks, please contact this office.    I suspect your cough is still due to a flare of the bronchitis. The antibiotic should be treating the infection, but start prednisone to help treat inflammation and wheezing. 2 pills per day for the next 5 days. Continue albuterol up to every 4-6 hours as needed for wheezing. If you are more short of breath, fevers, or otherwise worsening, return here or the emergency room.  If you are not improving in the next 3 days, return for recheck.  Your chest wall pain appears to be due to the coughing. If you do have any increased chest pain, pressure, nausea, or shortness of breath with chest pain, recommend immediate evaluation through the emergency room or our office.   Chest Wall Pain Chest wall pain is pain in or around the bones and muscles of your chest. Sometimes, an injury causes this pain. Sometimes, the cause may not be known. This pain may take several weeks or longer to get better. HOME CARE INSTRUCTIONS  Pay attention to any changes in your symptoms. Take these actions to help with your pain:   Rest as told by your health care provider.   Avoid activities that cause pain. These include any activities  that use your chest muscles or your abdominal and side muscles to lift heavy items.   If directed, apply ice to the painful area:  Put ice in a plastic bag.  Place a towel between your skin and the bag.  Leave the ice on for 20 minutes, 2-3 times per day.  Take over-the-counter and prescription medicines only as told by your health care provider.  Do not use tobacco products, including cigarettes, chewing tobacco, and e-cigarettes. If you need help quitting, ask your health care provider.  Keep all follow-up visits as told by your health care provider. This is important. SEEK MEDICAL CARE IF:  You have a fever.  Your chest pain becomes worse.  You have new symptoms. SEEK IMMEDIATE MEDICAL CARE IF:  You have nausea or vomiting.  You feel sweaty or light-headed.  You have a cough with phlegm (sputum) or you cough up blood.  You develop shortness of breath.   This information is not intended to replace advice given to you by your health care provider. Make sure you discuss any questions you have with your health care provider.   Document Released: 09/19/2005 Document Revised: 06/10/2015 Document Reviewed: 12/15/2014 Elsevier Interactive Patient Education [employer].  Return to the clinic or go to the nearest emergency room if any of your symptoms worsen or new symptoms occur.

## 2016-04-06 ENCOUNTER — Encounter: Payer: Self-pay | Admitting: Cardiovascular Disease

## 2016-04-14 ENCOUNTER — Ambulatory Visit (INDEPENDENT_AMBULATORY_CARE_PROVIDER_SITE_OTHER): Payer: Medicare Other | Admitting: Family Medicine

## 2016-04-14 ENCOUNTER — Encounter: Payer: Self-pay | Admitting: Family Medicine

## 2016-04-14 VITALS — BP 136/80 | HR 60 | Temp 98.1°F | Resp 16 | Ht 72.0 in | Wt 209.2 lb

## 2016-04-14 DIAGNOSIS — R197 Diarrhea, unspecified: Secondary | ICD-10-CM | POA: Diagnosis not present

## 2016-04-14 DIAGNOSIS — R112 Nausea with vomiting, unspecified: Secondary | ICD-10-CM | POA: Diagnosis not present

## 2016-04-14 DIAGNOSIS — R1084 Generalized abdominal pain: Secondary | ICD-10-CM

## 2016-04-14 DIAGNOSIS — R0789 Other chest pain: Secondary | ICD-10-CM | POA: Diagnosis not present

## 2016-04-14 DIAGNOSIS — T675XXA Heat exhaustion, unspecified, initial encounter: Secondary | ICD-10-CM | POA: Diagnosis not present

## 2016-04-14 LAB — POCT URINALYSIS DIP (MANUAL ENTRY)
Bilirubin, UA: NEGATIVE
Blood, UA: NEGATIVE
Glucose, UA: NEGATIVE
LEUKOCYTES UA: NEGATIVE
Nitrite, UA: NEGATIVE
PH UA: 5.5
Spec Grav, UA: >=1.03
UROBILINOGEN UA: 0.2

## 2016-04-14 LAB — POCT CBC
GRANULOCYTE PERCENT: 56.8 % (ref 37–80)
HEMATOCRIT: 37.6 % — AB (ref 43.5–53.7)
HEMOGLOBIN: 13.1 g/dL — AB (ref 14.1–18.1)
Lymph, poc: 2.8 (ref 0.6–3.4)
MCH: 30.1 pg (ref 27–31.2)
MCHC: 34.7 g/dL (ref 31.8–35.4)
MCV: 86.5 fL (ref 80–97)
MID (cbc): 0.6 (ref 0–0.9)
MPV: 8.3 fL (ref 0–99.8)
POC GRANULOCYTE: 4.5 (ref 2–6.9)
POC LYMPH PERCENT: 35.8 %L (ref 10–50)
POC MID %: 7.4 % (ref 0–12)
Platelet Count, POC: 170 10*3/uL (ref 142–424)
RBC: 4.35 M/uL — AB (ref 4.69–6.13)
RDW, POC: 13.8 %
WBC: 7.9 10*3/uL (ref 4.6–10.2)

## 2016-04-14 LAB — COMPLETE METABOLIC PANEL WITH GFR
ALT: 12 U/L (ref 9–46)
AST: 12 U/L (ref 10–35)
Albumin: 3.8 g/dL (ref 3.6–5.1)
Alkaline Phosphatase: 84 U/L (ref 40–115)
BUN: 18 mg/dL (ref 7–25)
CHLORIDE: 105 mmol/L (ref 98–110)
CO2: 30 mmol/L (ref 20–31)
Calcium: 9 mg/dL (ref 8.6–10.3)
Creat: 1.01 mg/dL (ref 0.70–1.18)
GFR, EST AFRICAN AMERICAN: 85 mL/min (ref >=60)
GFR, EST NON AFRICAN AMERICAN: 73 mL/min (ref >=60)
Glucose, Bld: 86 mg/dL (ref 65–99)
POTASSIUM: 3.6 mmol/L (ref 3.5–5.3)
Sodium: 144 mmol/L (ref 135–146)
Total Bilirubin: 0.4 mg/dL (ref 0.2–1.2)
Total Protein: 6.1 g/dL (ref 6.1–8.1)

## 2016-04-14 LAB — LIPASE: Lipase: 34 U/L (ref 7–60)

## 2016-04-14 NOTE — Patient Instructions (Addendum)
IF you received an x-ray today, you will receive an invoice from [facility]. Please contact [facility] at [phone] with questions or concerns regarding your invoice.   IF you received labwork today, you will receive an invoice from [employer]. Please contact [facility] at [phone] with questions or concerns regarding your invoice.   Our billing staff will not be able to assist you with questions regarding bills from these companies.  You will be contacted with the lab results as soon as they are available. The fastest way to get your results is to activate your My Chart account. Instructions are located on the last page of this paperwork. If you have not heard from [place] regarding the results in 2 weeks, please contact this office.    You likely had some component of heat exhaustion or heat illness a few days ago versus a viral infection that appears to be improving.  Your urine test shows you still may be slightly dehydrated. I will check some liver tests, pancreas test, and other electrolytes to make sure these are stable or improving. For the next day or 2, make sure you drink plenty of fluids (not caffeinated or energy drinks), stay cool and out of the heat as much as possible. If you have any persistent chest symptoms, or chest pain, sweating, dizziness return, proceed to the emergency room for other testing.  Return to the clinic or go to the nearest emergency room if any of your symptoms worsen or new symptoms occur.  Diarrhea Diarrhea is frequent loose and watery bowel movements. It can cause you to feel weak and dehydrated. Dehydration can cause you to become tired and thirsty, have a dry mouth, and have decreased urination that often is dark yellow. Diarrhea is a sign of another problem, most often an infection that will not last long. In most cases, diarrhea typically lasts 2-3 days. However, it can last longer if it is a sign of  something more serious. It is important to treat your diarrhea as directed by your caregiver to lessen or prevent future episodes of diarrhea. CAUSES  Some common causes include:  Gastrointestinal infections caused by viruses, bacteria, or parasites.  Food poisoning or food allergies.  Certain medicines, such as antibiotics, chemotherapy, and laxatives.  Artificial sweeteners and fructose.  Digestive disorders. HOME CARE INSTRUCTIONS  Ensure adequate fluid intake (hydration): Have 1 cup (8 oz) of fluid for each diarrhea episode. Avoid fluids that contain simple sugars or sports drinks, fruit juices, whole milk products, and sodas. Your urine should be clear or pale yellow if you are drinking enough fluids. Hydrate with an oral rehydration solution that you can purchase at pharmacies, retail stores, and online. You can prepare an oral rehydration solution at home by mixing the following ingredients together:   - tsp table salt.   tsp baking soda.   tsp salt substitute containing potassium chloride.  1  tablespoons sugar.  1 L (34 oz) of water.  Certain foods and beverages may increase the speed at which food moves through the gastrointestinal (GI) tract. These foods and beverages should be avoided and include:  Caffeinated and alcoholic beverages.  High-fiber foods, such as raw fruits and vegetables, nuts, seeds, and whole grain breads and cereals.  Foods and beverages sweetened with sugar alcohols, such as xylitol, sorbitol, and mannitol.  Some foods may be well tolerated and may help thicken stool including:  Starchy foods, such as rice, toast, pasta, low-sugar cereal, oatmeal,  grits, baked potatoes, crackers, and bagels.  Bananas.  Applesauce.  Add probiotic-rich foods to help increase healthy bacteria in the GI tract, such as yogurt and fermented milk products.  Wash your hands well after each diarrhea episode.  Only take over-the-counter or prescription medicines  as directed by your caregiver.  Take a warm bath to relieve any burning or pain from frequent diarrhea episodes. SEEK IMMEDIATE MEDICAL CARE IF:   You are unable to keep fluids down.  You have persistent vomiting.  You have blood in your stool, or your stools are black and tarry.  You do not urinate in 6-8 hours, or there is only a small amount of very dark urine.  You have abdominal pain that increases or localizes.  You have weakness, dizziness, confusion, or light-headedness.  You have a severe headache.  Your diarrhea gets worse or does not get better.  You have a fever or persistent symptoms for more than 2-3 days.  You have a fever and your symptoms suddenly get worse. MAKE SURE YOU:   Understand these instructions.  Will watch your condition.  Will get help right away if you are not doing well or get worse.   This information is not intended to replace advice given to you by your health care provider. Make sure you discuss any questions you have with your health care provider.   Document Released: 09/09/2002 Document Revised: 10/10/2014 Document Reviewed: 05/27/2012 Elsevier Interactive Patient Education 2016 Else[employer] Nausea and Vomiting Nausea is a sick feeling that often comes before throwing up (vomiting). Vomiting is a reflex where stomach contents come out of your mouth. Vomiting can cause severe loss of body fluids (dehydration). Children and elderly adults can become dehydrated quickly, especially if they also have diarrhea. Nausea and vomiting are symptoms of a condition or disease. It is important to find the cause of your symptoms. CAUSES   Direct irritation of the stomach lining. This irritation can result from increased acid production (gastroesophageal reflux disease), infection, food poisoning, taking certain medicines (such as nonsteroidal anti-inflammatory drugs), alcohol use, or tobacco use.  Signals from the brain.These signals could be caused  by a headache, heat exposure, an inner ear disturbance, increased pressure in the brain from injury, infection, a tumor, or a concussion, pain, emotional stimulus, or metabolic problems.  An obstruction in the gastrointestinal tract (bowel obstruction).  Illnesses such as diabetes, hepatitis, gallbladder problems, appendicitis, kidney problems, cancer, sepsis, atypical symptoms of a heart attack, or eating disorders.  Medical treatments such as chemotherapy and radiation.  Receiving medicine that makes you sleep (general anesthetic) during surgery. DIAGNOSIS Your caregiver may ask for tests to be done if the problems do not improve after a few days. Tests may also be done if symptoms are severe or if the reason for the nausea and vomiting is not clear. Tests may include:  Urine tests.  Blood tests.  Stool tests.  Cultures (to look for evidence of infection).  X-rays or other imaging studies. Test results can help your caregiver make decisions about treatment or the need for additional tests. TREATMENT You need to stay well hydrated. Drink frequently but in small amounts.You may wish to drink water, sports drinks, clear broth, or eat frozen ice pops or gelatin dessert to help stay hydrated.When you eat, eating slowly may help prevent nausea.There are also some antinausea medicines that may help prevent nausea. HOME CARE INSTRUCTIONS   Take all medicine as directed by your caregiver.  If you do not  have an appetite, do not force yourself to eat. However, you must continue to drink fluids.  If you have an appetite, eat a normal diet unless your caregiver tells you differently.  Eat a variety of complex carbohydrates (rice, wheat, potatoes, bread), lean meats, yogurt, fruits, and vegetables.  Avoid high-fat foods because they are more difficult to digest.  Drink enough water and fluids to keep your urine clear or pale yellow.  If you are dehydrated, ask your caregiver for  specific rehydration instructions. Signs of dehydration may include:  Severe thirst.  Dry lips and mouth.  Dizziness.  Dark urine.  Decreasing urine frequency and amount.  Confusion.  Rapid breathing or pulse. SEEK IMMEDIATE MEDICAL CARE IF:   You have blood or brown flecks (like coffee grounds) in your vomit.  You have black or bloody stools.  You have a severe headache or stiff neck.  You are confused.  You have severe abdominal pain.  You have chest pain or trouble breathing.  You do not urinate at least once every 8 hours.  You develop cold or clammy skin.  You continue to vomit for longer than 24 to 48 hours.  You have a fever. MAKE SURE YOU:   Understand these instructions.  Will watch your condition.  Will get help right away if you are not doing well or get worse.   This information is not intended to replace advice given to you by your health care provider. Make sure you discuss any questions you have with your health care provider.   Document Released: 09/19/2005 Document Revised: 12/12/2011 Document Reviewed: 02/16/2011 Elsevier Interactive Patient Education 2016 [place] Exhaustion Information WHAT IS HEAT EXHAUSTION? Heat exhaustion happens when your body gets overheated from hot weather or from exercise. Heat exhaustion makes the temperature of your skin and body go up. Your body cools itself by sweating. If you do not drink enough water to replace what you sweat, you lose too much water and salt. This makes it harder for your body to produce more sweat. When you do not sweat enough, your body cannot cool down, and heat exhaustion may result. Heat exhaustion can lead to heatstroke, which is a more serious illness. WHO IS AT RISK FOR HEAT EXHAUSTION? Anyone can get heat exhaustion. However, heat exhaustion is more likely when you are exercising or doing a physical activity. It is also more likely when you are in hot and humid weather or  bright sunshine. Heat exhaustion is also more likely to develop in:  People who are age 65 or older.  Children.  People who have a medical condition such as heart disease, poor circulation, sickle cell disease, or high blood pressure.  People who have a fever.  People who are very overweight (obese).  People who are dehydrated from:  Drinking alcohol or caffeine.  Taking certain medicines, such as diuretics or stimulants. WHAT ARE THE SYMPTOMS OF HEAT EXHAUSTION? Symptoms of heat exhaustion include:  A body temperature of up to 104F (40C).  Moist, cool, and clammy skin.  Dizziness.  Headache.  Nausea.  Fatigue.  Thirst.  Dark-colored urine.  Rapid pulse or heartbeat.  Weakness.  Muscle cramps.  Confusion.  Fainting. WHAT SHOULD I DO IF I THINK I HAVE HEAT EXHAUSTION? If you think that you have heat exhaustion, call your health care provider. Follow his or her instructions. You should also:  Call a friend or a family member and ask someone to stay with you.  Move to  a cooler location, such as:  Into the shade.  In front of a fan.  Someplace that has air conditioning.  Lie down and rest.  Slowly drink nonalcoholic, caffeine-free fluids.  Take off any extra clothing or tight-fitting clothes.  Take a cool bath or shower, if possible. If you do not have access to a bath or shower, dab or mist cool water on your skin. WHY IS IT IMPORTANT TO TREAT HEAT EXHAUSTION? It is extremely important to take care of yourself and treat heat exhaustion as soon as possible. Untreated heat exhaustion can turn into heatstroke. Symptoms of heatstroke include:  A body temperature of 104F (40C) or higher.  Hot, red skin that may be dry or moist.  Severe headache.  Nausea and vomiting.  Muscle weakness and cramping.  Confusion.  Rapid breathing.  Fainting.  Seizure. These symptoms may represent a serious problem that is an emergency. Do not wait to see  if the symptoms will go away. Get medical help right away. Call your local emergency services (911 in the [place] Do not drive yourself to the hospital. Heatstroke is a life-threatening condition that requires urgent medical treatment. Do not treat heatstroke at home. Heatstroke should be treated by a health care professional and may require hospitalization. At the hospital, you may need to receive fluids through an IV tube:  If you cannot drink any fluids.  If you vomit any fluids that you drink.  If your symptoms do not get better after one hour.  If your symptoms get worse after one hour. HOW CAN I PREVENT HEAT EXHAUSTION?  Avoid outdoor activities on very hot or humid days.  Do not exercise or do other physical activity when you are not feeling well.  Drink plenty of nonalcoholic and caffeine-free fluids before and during physical activity.  Take frequent breaks for rest during physical activity.  Wear light-colored, loose-fitting, and lightweight clothing in the heat.  Wear a hat and use sunscreen when exercising outdoors.  Avoid being outside during the hottest times of the day.  Check with your health care provider before you start any new activity, especially if you take medicine or have a medical condition.  Start any new activity slowly and work up to your fitness level. HOW CAN I HELP TO PROTECT ELDERLY RELATIVES AND NEIGHBORS FROM HEAT EXHAUSTION? People who are age 91 or older are at greater risk for heat exhaustion. Their bodies have a harder time adjusting to heat. They are also more likely to have a medical condition or be on medicines that increase their risk for heat exhaustion. They may get heat exhaustion indoors if the heat is high for several days. You can help to protect them during hot weather by:  Checking on them two or more times each day.  Making sure that they are drinking plenty of cool, nonalcoholic, and caffeine-free fluids.  Making sure that they  use their air conditioner.  Taking them to a location where air conditioning is available.  Talking with their health care provider about their medical needs, medicines, and fluid requirements.   This information is not intended to replace advice given to you by your health care provider. Make sure you discuss any questions you have with your health care provider.   Document Released: 06/28/2008 Document Revised: 06/10/2015 Document Reviewed: 08/27/2014 Elsevier Interactive Patient Education [employer].
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
## 2016-04-25 ENCOUNTER — Encounter: Payer: Self-pay | Admitting: Pulmonary Disease

## 2016-04-25 ENCOUNTER — Ambulatory Visit (INDEPENDENT_AMBULATORY_CARE_PROVIDER_SITE_OTHER): Payer: Medicare Other | Admitting: Pulmonary Disease

## 2016-04-25 DIAGNOSIS — R918 Other nonspecific abnormal finding of lung field: Secondary | ICD-10-CM | POA: Diagnosis not present

## 2016-04-25 DIAGNOSIS — F17200 Nicotine dependence, unspecified, uncomplicated: Secondary | ICD-10-CM

## 2016-04-25 MED ORDER — AZITHROMYCIN 250 MG PO TABS: ORAL_TABLET | ORAL | 0 refills | Status: DC

## 2016-04-25 MED ORDER — PREDNISONE 10 MG PO TABS: ORAL_TABLET | ORAL | 0 refills | Status: DC

## 2016-04-25 NOTE — Assessment & Plan Note (Addendum)
You are having a COPD flareup Prednisone 10 mg tabs  Take 2 tabs daily with food x 5ds, then 1 tab daily with food x 5ds then STOP Zpak  Sample of Anoro once daily - call for Rx if this works

## 2016-04-25 NOTE — Assessment & Plan Note (Signed)
Counselled x 5 minutes

## 2016-04-25 NOTE — Progress Notes (Signed)
Patient ID: Arthur Quince., male   DOB: [birth year], 75 y.o.   MRN: [id number] 74 y.o.  initially seen  2009 for nonischemic cardiomyopathy. He has a chronic left bundle branch block. Has been no lower extremity edema.  Cath 2009  showed no coronary disease. He is no longer taking his lisinopril. Diet is poor with high salt content He has a chronic left bundle-branch block but no evidence of presyncope syncope or high-grade heart block. LV function. was 40% by echo in February of 2009.   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 Wall thickness was normal. Systolic function was moderately reduced. The estimated ejection fraction was in the range of 35% to 40%. Severe hypokinesis of the mid-apicalanteroseptal myocardium. Doppler parameters are consistent with abnormal left ventricular relaxation (grade 1 diastolic dysfunction). - Aortic valve: Valve area (VTI): 3.6 cm^2. Valve area (Vmax): 3.92 cm^2. - Right atrium: The atrium was mildly dilated.   04/2015 Kidney stones Also CT chest shows multiple lung nodules and he may have cancer Stopped smoking after chest CT in October when nodules discovered  Seeing [facility].  Last CT 02/2015 reviewed stable nodules f/u planned 09/2015  Lots of sinus infections had polyps removed by [facility]  Having some SSCP not always exertional some dyspnea Got sick in truck last week ? Heat stroke AC not working   ROS: Denies fever, malais, weight loss, blurry vision, decreased visual acuity, cough, sputum, SOB, hemoptysis, pleuritic pain, palpitaitons, heartburn, abdominal pain, melena, lower extremity edema, claudication, or rash.  All other systems reviewed and negative  General: Affect appropriate Chronically ill white male  HEENT: normal Neck supple with no adenopathy JVP normal no bruits no thyromegaly Lungs rhonchi exp   wheezing and good diaphragmatic motion Heart:  S1/S2 no murmur, no rub, gallop or click PMI normal Abdomen: benighn, BS positve, no tenderness, no AAA no bruit.  No HSM or HJR Distal pulses intact with no bruits No edema Neuro non-focal Skin warm and dry No muscular weakness   Current Outpatient Prescriptions  Medication Sig Dispense Refill  . albuterol (PROVENTIL HFA;VENTOLIN HFA) 108 (90 Base) MCG/ACT inhaler Inhale 1-2 puffs into the lungs every 4 (four) hours as needed for wheezing or shortness of breath. 1 Inhaler 0  . azithromycin (ZITHROMAX) 250 MG tablet Use as directed. 6 tablet 0  . carvedilol (COREG) 6.25 MG tablet Take 6.25 mg by mouth 2 (two) times daily with a meal.     . levothyroxine (SYNTHROID, LEVOTHROID) 200 MCG tablet take 1 tablet by mouth once daily 90 tablet 0  . losartan (COZAAR) 50 MG tablet Take 1 tablet (50 mg total) by mouth daily. 90 tablet 3  . predniSONE (DELTASONE) 10 MG tablet Take 2 tabs with food x 5 days, 1 tab daily x 5 days, then STOP 15 tablet 0  . rizatriptan (MAXALT) 10 MG tablet Take 1 tablet (10 mg total) by mouth as needed for migraine. May repeat in 2 hours if needed 10 tablet 11   No current facility-administered medications for this visit.     Allergies  Doxycycline  Electrocardiogram:  SR LBBB first degree AV block 6/15   Assessment and Plan LBBB: stable no AV block or syncope DCM: stable compliant with meds echo for EF  GERD: discussed a low carb diet and limited ETOH Lung Nodule  Stable by CT f/u [facility] would not appear to be active CA Sinus:  F/u ENT  Been on antibiotics  Chest Pain: ECG with LBBB last cath 2009 no CAD f/u lexiscan myovue   F/U with me next available   Jenkins Rouge

## 2016-04-25 NOTE — Assessment & Plan Note (Signed)
Nodules have decreased in size unlikely malignancy likely benign no further follow-up required

## 2016-04-25 NOTE — Progress Notes (Signed)
   Subjective:    Patient ID: Arthur Quince., male    DOB: May 04, 1941, 75 y.o.   MRN: [id number]  HPI  75 yo male smoker  with ischemic CM followed for multiple lung nodules and COPD    04/25/2016  Chief Complaint  Patient presents with  . Follow-up    Wheezing a lot, chest tightness, mid-may was diagnosed with PNA.     Significant tests/ events  08/2014 CT chest showed centrilobular emphysema and multiple subcentimeter nodules, largest being 5 mm in the right lower lobe.  02/2015 CT chest >> unchanged nodules, 1cm subcarinal LN unchanged 10/2014 Spirometry FEV1 66%, with a ratio 63, and FVC 79%  MRI 03/2015 >> Air-fluid level left maxillary sinus, pansinusitis  CT chest 09/29/15- all previously noted nodules are stable or smaller c/w benign etiology   Review of Systems     Objective:   Physical Exam        Assessment & Plan:

## 2016-04-25 NOTE — Patient Instructions (Addendum)
You are having a COPD flareup Prednisone 10 mg tabs  Take 2 tabs daily with food x 5ds, then 1 tab daily with food x 5ds then STOP Zpakdays  Sample of Anoro once daily - call for Rx if this works

## 2016-04-26 ENCOUNTER — Encounter: Payer: Self-pay | Admitting: Emergency Medicine

## 2016-04-26 ENCOUNTER — Encounter: Payer: Self-pay | Admitting: Cardiovascular Disease

## 2016-04-26 ENCOUNTER — Ambulatory Visit (INDEPENDENT_AMBULATORY_CARE_PROVIDER_SITE_OTHER): Payer: Medicare Other | Admitting: Cardiovascular Disease

## 2016-04-26 VITALS — BP 156/80 | HR 65 | Ht 72.0 in | Wt 211.8 lb

## 2016-04-26 DIAGNOSIS — I428 Other cardiomyopathies: Secondary | ICD-10-CM

## 2016-04-26 DIAGNOSIS — I429 Cardiomyopathy, unspecified: Secondary | ICD-10-CM

## 2016-04-26 DIAGNOSIS — R0789 Other chest pain: Secondary | ICD-10-CM | POA: Diagnosis not present

## 2016-04-26 NOTE — Patient Instructions (Addendum)
Medication Instructions:  Your physician recommends that you continue on your current medications as directed. Please refer to the Current Medication list given to you today.  Labwork: NONE  Testing/Procedures: Your physician has requested that you have a lexiscan myoview. For further information please visit [email or web address]. Please follow instruction sheet, as given.  Your physician has requested that you have an echocardiogram. Echocardiography is a painless test that uses sound waves to create images of your heart. It provides your doctor with information about the size and shape of your heart and how well your heart's chambers and valves are working. This procedure takes approximately one hour. There are no restrictions for this procedure.   Follow-Up: Your physician wants you to follow-up next available with [name].    If you need a refill on your cardiac medications before your next appointment, please call your pharmacy.

## 2016-05-02 ENCOUNTER — Ambulatory Visit: Payer: Medicare Other | Admitting: Cardiovascular Disease

## 2016-05-04 ENCOUNTER — Telehealth (HOSPITAL_COMMUNITY): Payer: Self-pay | Admitting: *Deleted

## 2016-05-04 ENCOUNTER — Telehealth: Payer: Self-pay | Admitting: *Deleted

## 2016-05-04 NOTE — Telephone Encounter (Signed)
Follow Up:; ° ° °Returning your call. °

## 2016-05-04 NOTE — Telephone Encounter (Signed)
Left message on voicemail in reference to upcoming appointment scheduled for 05/10/16. Phone number given for a call back so details instructions can be given. Arthur [name] W   

## 2016-05-05 ENCOUNTER — Telehealth (HOSPITAL_COMMUNITY): Payer: Self-pay | Admitting: Radiology

## 2016-05-05 NOTE — Telephone Encounter (Signed)
Patient given detailed instructions per Myocardial Perfusion Study Information Sheet for the test on 05/10/16  at 0715. Patient notified to arrive 15 minutes early and that it is imperative to arrive on time for appointment to keep from having the test rescheduled.  If you need to cancel or reschedule your appointment, please call the office within 24 hours of your appointment. Failure to do so may result in a cancellation of your appointment, and a $50 no show fee. Patient verbalized understanding. [name]

## 2016-05-10 ENCOUNTER — Encounter (INDEPENDENT_AMBULATORY_CARE_PROVIDER_SITE_OTHER): Payer: Self-pay

## 2016-05-10 ENCOUNTER — Ambulatory Visit (HOSPITAL_BASED_OUTPATIENT_CLINIC_OR_DEPARTMENT_OTHER): Payer: Medicare Other

## 2016-05-10 ENCOUNTER — Other Ambulatory Visit: Payer: Self-pay

## 2016-05-10 ENCOUNTER — Ambulatory Visit (HOSPITAL_COMMUNITY): Payer: Medicare Other | Attending: Cardiovascular Disease

## 2016-05-10 DIAGNOSIS — I447 Left bundle-branch block, unspecified: Secondary | ICD-10-CM | POA: Insufficient documentation

## 2016-05-10 DIAGNOSIS — I428 Other cardiomyopathies: Secondary | ICD-10-CM | POA: Insufficient documentation

## 2016-05-10 DIAGNOSIS — I429 Cardiomyopathy, unspecified: Secondary | ICD-10-CM

## 2016-05-10 DIAGNOSIS — R0609 Other forms of dyspnea: Secondary | ICD-10-CM | POA: Diagnosis not present

## 2016-05-10 DIAGNOSIS — R0789 Other chest pain: Secondary | ICD-10-CM

## 2016-05-10 DIAGNOSIS — I119 Hypertensive heart disease without heart failure: Secondary | ICD-10-CM | POA: Insufficient documentation

## 2016-05-10 DIAGNOSIS — I059 Rheumatic mitral valve disease, unspecified: Secondary | ICD-10-CM | POA: Diagnosis not present

## 2016-05-10 DIAGNOSIS — R9439 Abnormal result of other cardiovascular function study: Secondary | ICD-10-CM | POA: Diagnosis not present

## 2016-05-10 DIAGNOSIS — J449 Chronic obstructive pulmonary disease, unspecified: Secondary | ICD-10-CM | POA: Insufficient documentation

## 2016-05-10 DIAGNOSIS — R0602 Shortness of breath: Secondary | ICD-10-CM | POA: Diagnosis not present

## 2016-05-10 DIAGNOSIS — I517 Cardiomegaly: Secondary | ICD-10-CM | POA: Insufficient documentation

## 2016-05-10 DIAGNOSIS — Z720 Tobacco use: Secondary | ICD-10-CM | POA: Insufficient documentation

## 2016-05-10 DIAGNOSIS — I351 Nonrheumatic aortic (valve) insufficiency: Secondary | ICD-10-CM | POA: Insufficient documentation

## 2016-05-10 LAB — MYOCARDIAL PERFUSION IMAGING
CHL CUP NUCLEAR SRS: 13
CHL CUP NUCLEAR SSS: 16
LV sys vol: 103 mL
LVDIAVOL: 189 mL (ref 62–150)
Peak HR: 77 {beats}/min
RATE: 0.25
Rest HR: 56 {beats}/min
SDS: 3
TID: 0.91

## 2016-05-10 MED ORDER — TECHNETIUM TC 99M TETROFOSMIN IV KIT
32.8000 | PACK | Freq: Once | INTRAVENOUS | Status: AC | PRN
  Administered 2016-05-10: 32.8 via INTRAVENOUS
  Filled 2016-05-10: qty 33

## 2016-05-10 MED ORDER — REGADENOSON 0.4 MG/5ML IV SOLN
0.4000 mg | Freq: Once | INTRAVENOUS | Status: AC
  Administered 2016-05-10: 0.4 mg via INTRAVENOUS

## 2016-05-10 MED ORDER — TECHNETIUM TC 99M TETROFOSMIN IV KIT
10.4000 | PACK | Freq: Once | INTRAVENOUS | Status: AC | PRN
  Administered 2016-05-10: 10 via INTRAVENOUS
  Filled 2016-05-10: qty 10

## 2016-05-12 ENCOUNTER — Telehealth: Payer: Self-pay | Admitting: Cardiovascular Disease

## 2016-05-12 NOTE — Telephone Encounter (Signed)
Patient is aware of lab results. Patient requested a reminder letter for his appointment in October. Will send letter.

## 2016-05-12 NOTE — Telephone Encounter (Signed)
Arthur [name] is returning a call about some test results .[name]    Thanks

## 2016-05-13 ENCOUNTER — Other Ambulatory Visit: Payer: Self-pay | Admitting: Emergency Medicine

## 2016-05-13 ENCOUNTER — Other Ambulatory Visit: Payer: Self-pay | Admitting: Family Medicine

## 2016-06-18 DIAGNOSIS — R55 Syncope and collapse: Secondary | ICD-10-CM | POA: Diagnosis not present

## 2016-06-18 DIAGNOSIS — I10 Essential (primary) hypertension: Secondary | ICD-10-CM | POA: Diagnosis not present

## 2016-06-18 DIAGNOSIS — Z8546 Personal history of malignant neoplasm of prostate: Secondary | ICD-10-CM | POA: Diagnosis not present

## 2016-06-18 DIAGNOSIS — E079 Disorder of thyroid, unspecified: Secondary | ICD-10-CM | POA: Diagnosis not present

## 2016-06-18 DIAGNOSIS — Z720 Tobacco use: Secondary | ICD-10-CM | POA: Diagnosis not present

## 2016-06-18 DIAGNOSIS — R110 Nausea: Secondary | ICD-10-CM | POA: Diagnosis not present

## 2016-06-18 DIAGNOSIS — R9431 Abnormal electrocardiogram [ECG] [EKG]: Secondary | ICD-10-CM | POA: Diagnosis not present

## 2016-06-18 DIAGNOSIS — R404 Transient alteration of awareness: Secondary | ICD-10-CM | POA: Diagnosis not present

## 2016-06-18 DIAGNOSIS — R42 Dizziness and giddiness: Secondary | ICD-10-CM | POA: Diagnosis not present

## 2016-06-18 DIAGNOSIS — Z5321 Procedure and treatment not carried out due to patient leaving prior to being seen by health care provider: Secondary | ICD-10-CM | POA: Diagnosis not present

## 2016-06-18 DIAGNOSIS — R079 Chest pain, unspecified: Secondary | ICD-10-CM | POA: Diagnosis not present

## 2016-06-20 ENCOUNTER — Encounter: Payer: Self-pay | Admitting: Physician Assistant
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
## 2016-06-21 ENCOUNTER — Ambulatory Visit (INDEPENDENT_AMBULATORY_CARE_PROVIDER_SITE_OTHER): Payer: Medicare Other | Admitting: Physician Assistant

## 2016-06-21 ENCOUNTER — Encounter: Payer: Self-pay | Admitting: Physician Assistant

## 2016-06-21 VITALS — BP 138/80 | HR 64 | Ht 72.0 in | Wt 216.0 lb

## 2016-06-21 DIAGNOSIS — I5022 Chronic systolic (congestive) heart failure: Secondary | ICD-10-CM | POA: Diagnosis not present

## 2016-06-21 DIAGNOSIS — R55 Syncope and collapse: Secondary | ICD-10-CM | POA: Diagnosis not present

## 2016-06-21 DIAGNOSIS — I10 Essential (primary) hypertension: Secondary | ICD-10-CM

## 2016-06-21 DIAGNOSIS — Z720 Tobacco use: Secondary | ICD-10-CM

## 2016-06-21 DIAGNOSIS — R5383 Other fatigue: Secondary | ICD-10-CM | POA: Diagnosis not present

## 2016-06-21 DIAGNOSIS — I428 Other cardiomyopathies: Secondary | ICD-10-CM

## 2016-06-21 DIAGNOSIS — I429 Cardiomyopathy, unspecified: Secondary | ICD-10-CM | POA: Diagnosis not present

## 2016-06-21 DIAGNOSIS — I447 Left bundle-branch block, unspecified: Secondary | ICD-10-CM | POA: Diagnosis not present

## 2016-06-21 DIAGNOSIS — I452 Bifascicular block: Secondary | ICD-10-CM

## 2016-06-21 LAB — CBC WITH DIFFERENTIAL/PLATELET
BASOS ABS: 0 {cells}/uL (ref 0–200)
BASOS PCT: 0 %
EOS ABS: 332 {cells}/uL (ref 15–500)
EOS PCT: 4 %
HCT: 41.9 % (ref 38.5–50.0)
Hemoglobin: 14.1 g/dL (ref 13.2–17.1)
LYMPHS PCT: 25 %
Lymphs Abs: 2075 cells/uL (ref 850–3900)
MCH: 29.9 pg (ref 27.0–33.0)
MCHC: 33.7 g/dL (ref 32.0–36.0)
MCV: 89 fL (ref 80.0–100.0)
MONOS PCT: 8 %
MPV: 11.4 fL (ref 7.5–12.5)
Monocytes Absolute: 664 cells/uL (ref 200–950)
Neutro Abs: 5229 cells/uL (ref 1500–7800)
Neutrophils Relative %: 63 %
PLATELETS: 257 10*3/uL (ref 140–400)
RBC: 4.71 MIL/uL (ref 4.20–5.80)
RDW: 13 % (ref 11.0–15.0)
WBC: 8.3 10*3/uL (ref 3.8–10.8)

## 2016-06-21 LAB — MAGNESIUM: MAGNESIUM: 1.9 mg/dL (ref 1.5–2.5)

## 2016-06-21 LAB — BASIC METABOLIC PANEL
BUN: 16 mg/dL (ref 7–25)
CALCIUM: 9.8 mg/dL (ref 8.6–10.3)
CO2: 26 mmol/L (ref 20–31)
CREATININE: 0.89 mg/dL (ref 0.70–1.18)
Chloride: 105 mmol/L (ref 98–110)
Glucose, Bld: 94 mg/dL (ref 65–99)
Potassium: 4.3 mmol/L (ref 3.5–5.3)
Sodium: 143 mmol/L (ref 135–146)

## 2016-06-21 LAB — TSH: TSH: 1.2 mIU/L (ref 0.40–4.50)

## 2016-06-21 NOTE — Patient Instructions (Addendum)
Medication Instructions:  Your physician recommends that you continue on your current medications as directed. Please refer to the Current Medication list given to you today.   Labwork: Your physician recommends that you return for lab work in: TODAY (cbc, tsh, mg, bmet)   Testing/Procedures: Your physician has requested that you have an echocardiogram. Echocardiography is a painless test that uses sound waves to create images of your heart. It provides your doctor with information about the size and shape of your heart and how well your heart's chambers and valves are working. This procedure takes approximately one hour. There are no restrictions for this procedure. IN 2 MONTHS  Your physician has recommended that you wear an event monitor. Event monitors are medical devices that record the heart's electrical activity. Doctors most often [place] these monitors to diagnose arrhythmias. Arrhythmias are problems with the speed or rhythm of the heartbeat. The monitor is a small, portable device. You can wear one while you do your normal daily activities. This is usually used to diagnose what is causing palpitations/syncope (passing out).     Follow-Up: Your physician recommends that you schedule a follow-up appointment in: 2-3 MONTHS WITH Dr. Curt Bears.   Any Other Special Instructions Will Be Listed Below (If Applicable).     If you need a refill on your cardiac medications before your next appointment, please call your pharmacy.

## 2016-06-27 ENCOUNTER — Encounter: Payer: Self-pay | Admitting: Pulmonary Disease

## 2016-06-27 ENCOUNTER — Ambulatory Visit (INDEPENDENT_AMBULATORY_CARE_PROVIDER_SITE_OTHER): Payer: Medicare Other | Admitting: Pulmonary Disease

## 2016-06-27 VITALS — BP 110/68 | HR 71 | Ht 72.0 in | Wt 216.0 lb

## 2016-06-27 DIAGNOSIS — Z720 Tobacco use: Secondary | ICD-10-CM

## 2016-06-27 DIAGNOSIS — J441 Chronic obstructive pulmonary disease with (acute) exacerbation: Secondary | ICD-10-CM

## 2016-06-27 DIAGNOSIS — F17210 Nicotine dependence, cigarettes, uncomplicated: Secondary | ICD-10-CM

## 2016-06-27 DIAGNOSIS — Z23 Encounter for immunization: Secondary | ICD-10-CM | POA: Diagnosis not present

## 2016-06-27 MED ORDER — UMECLIDINIUM-VILANTEROL 62.5-25 MCG/INH IN AEPB: 1.0000 | INHALATION_SPRAY | Freq: Every day | RESPIRATORY_TRACT | 0 refills | Status: AC
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
## 2016-06-27 NOTE — Patient Instructions (Addendum)
It is nice to meet you. Continue using your Albuterol inhaler as you have been doing We will write you a prescription for Anoro. If this is not well covered by your insurance ask them which drug in that class is covered.  Call us for a prescription for the drug that is covered. Continue to try and quit smoking. This is the single most powerful action you can take to improve your health. Please get your flu shot today. Follow up with Dr. Elsworth Soho in 6 months or before as needed. Please contact office for sooner follow up if symptoms do not improve or worsen or seek emergency care

## 2016-06-28 ENCOUNTER — Ambulatory Visit (INDEPENDENT_AMBULATORY_CARE_PROVIDER_SITE_OTHER): Payer: Medicare Other

## 2016-06-28 DIAGNOSIS — R55 Syncope and collapse: Secondary | ICD-10-CM

## 2016-06-30 ENCOUNTER — Encounter: Payer: Self-pay | Admitting: Physician Assistant

## 2016-06-30 ENCOUNTER — Other Ambulatory Visit: Payer: Self-pay | Admitting: Cardiovascular Disease

## 2016-07-04 ENCOUNTER — Emergency Department (HOSPITAL_COMMUNITY)
Admission: EM | Admit: 2016-07-04 | Discharge: 2016-07-04 | Disposition: A | Discharge status: Home / Self Care | Payer: Medicare Other | Attending: Cardiovascular Disease | Admitting: Cardiovascular Disease

## 2016-07-04 ENCOUNTER — Ambulatory Visit: Payer: Medicare Other | Admitting: Cardiovascular Disease

## 2016-07-04 ENCOUNTER — Emergency Department (HOSPITAL_COMMUNITY): Payer: Medicare Other

## 2016-07-04 ENCOUNTER — Encounter (HOSPITAL_COMMUNITY): Payer: Self-pay

## 2016-07-04 ENCOUNTER — Telehealth: Payer: Self-pay | Admitting: *Deleted

## 2016-07-04 DIAGNOSIS — R55 Syncope and collapse: Secondary | ICD-10-CM | POA: Diagnosis not present

## 2016-07-04 DIAGNOSIS — I10 Essential (primary) hypertension: Secondary | ICD-10-CM | POA: Diagnosis not present

## 2016-07-04 DIAGNOSIS — I428 Other cardiomyopathies: Secondary | ICD-10-CM | POA: Diagnosis not present

## 2016-07-04 DIAGNOSIS — I447 Left bundle-branch block, unspecified: Secondary | ICD-10-CM | POA: Diagnosis not present

## 2016-07-04 DIAGNOSIS — I5022 Chronic systolic (congestive) heart failure: Secondary | ICD-10-CM | POA: Insufficient documentation

## 2016-07-04 DIAGNOSIS — J449 Chronic obstructive pulmonary disease, unspecified: Secondary | ICD-10-CM | POA: Diagnosis not present

## 2016-07-04 DIAGNOSIS — Z96652 Presence of left artificial knee joint: Secondary | ICD-10-CM | POA: Diagnosis not present

## 2016-07-04 DIAGNOSIS — F17210 Nicotine dependence, cigarettes, uncomplicated: Secondary | ICD-10-CM | POA: Insufficient documentation

## 2016-07-04 DIAGNOSIS — Z8546 Personal history of malignant neoplasm of prostate: Secondary | ICD-10-CM | POA: Insufficient documentation

## 2016-07-04 DIAGNOSIS — E039 Hypothyroidism, unspecified: Secondary | ICD-10-CM | POA: Insufficient documentation

## 2016-07-04 DIAGNOSIS — I110 Hypertensive heart disease with heart failure: Secondary | ICD-10-CM | POA: Insufficient documentation

## 2016-07-04 DIAGNOSIS — Z96651 Presence of right artificial knee joint: Secondary | ICD-10-CM | POA: Diagnosis not present

## 2016-07-04 DIAGNOSIS — R079 Chest pain, unspecified: Secondary | ICD-10-CM | POA: Diagnosis not present

## 2016-07-04 DIAGNOSIS — R0789 Other chest pain: Secondary | ICD-10-CM | POA: Diagnosis not present

## 2016-07-04 LAB — BASIC METABOLIC PANEL
ANION GAP: 6 (ref 5–15)
BUN: 11 mg/dL (ref 6–20)
CALCIUM: 9.6 mg/dL (ref 8.9–10.3)
CO2: 29 mmol/L (ref 22–32)
Chloride: 107 mmol/L (ref 101–111)
Creatinine, Ser: 0.89 mg/dL (ref 0.61–1.24)
GLUCOSE: 102 mg/dL — AB (ref 65–99)
Potassium: 4.5 mmol/L (ref 3.5–5.1)
Sodium: 142 mmol/L (ref 135–145)

## 2016-07-04 LAB — BRAIN NATRIURETIC PEPTIDE: B Natriuretic Peptide: 100.3 pg/mL — ABNORMAL HIGH (ref 0.0–100.0)

## 2016-07-04 LAB — I-STAT TROPONIN, ED: TROPONIN I, POC: 0.01 ng/mL (ref 0.00–0.08)

## 2016-07-04 LAB — CBC
HCT: 42.2 % (ref 39.0–52.0)
HEMOGLOBIN: 13.7 g/dL (ref 13.0–17.0)
MCH: 30 pg (ref 26.0–34.0)
MCHC: 32.5 g/dL (ref 30.0–36.0)
MCV: 92.5 fL (ref 78.0–100.0)
Platelets: 245 10*3/uL (ref 150–400)
RBC: 4.56 MIL/uL (ref 4.22–5.81)
RDW: 12.8 % (ref 11.5–15.5)
WBC: 7.9 10*3/uL (ref 4.0–10.5)

## 2016-07-04 LAB — TROPONIN I: Troponin I: <0.03 ng/mL (ref <0.03)

## 2016-07-04 MED ORDER — ACETAMINOPHEN 325 MG PO TABS
650.0000 mg | ORAL_TABLET | Freq: Four times a day (QID) | ORAL | Status: DC | PRN
  Administered 2016-07-04: 650 mg via ORAL
  Filled 2016-07-04: qty 2

## 2016-07-04 MED ORDER — NITROGLYCERIN 0.4 MG SL SUBL
0.4000 mg | SUBLINGUAL_TABLET | SUBLINGUAL | Status: DC | PRN
  Administered 2016-07-04 (×2): 0.4 mg via SUBLINGUAL
  Filled 2016-07-04: qty 1

## 2016-07-04 MED ORDER — LOSARTAN POTASSIUM 50 MG PO TABS: 75.0000 mg | ORAL_TABLET | Freq: Every day | ORAL | 3 refills | Status: DC
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
## 2016-07-04 NOTE — Telephone Encounter (Signed)
Patient walks in this morning at 7:50am.  States he thought he had an appt this morning.  Reports feeling chest tightness/pressure. States it feels like indigestion, but feels similar to CP he had almost 10 yrs ago.  When discussing current "feeling" he reports feeling better than he did earlier this morning.  Patient had recent Lexi showing no ischemia.  Patient is currently wearing a monitor.  Reviewed with monitor tech who could find no "events" since starting monitor. Patient was recently seen in office, 9/19, for syncopal event which prompted monitor. Patient was advised to go to ED to be evaluated for acute CP.   Will forward to Dr. Kyla Balzarine nurse to follow up with patient this week.

## 2016-07-04 NOTE — ED Notes (Signed)
Patient transported to X-ray 

## 2016-07-04 NOTE — ED Notes (Signed)
Pt returned from XR. Pt on monitors 
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
## 2016-07-04 NOTE — ED Provider Notes (Signed)
Patient seen and evaluated by Dr. Lacinda Axon earlier today. Cardiology consulted due to syncopal event. I have reviewed their note and they advised that if second troponin is normal he can be discharged home. They are titrating patient is uncertain to 75 mg daily and carvedilol at 6.25 mg twice daily. He is to follow-up with their office. They have discussed and instructed the patient and he voices understanding of the plan.   Pattricia Boss, MD 07/04/16 [phone]

## 2016-07-04 NOTE — ED Notes (Signed)
Report given to ruth rn

## 2016-07-04 NOTE — Discharge Instructions (Signed)
Please adjust medications as per Dr. Claiborne Billings. Follow-up with cardiology as instructed.

## 2016-07-04 NOTE — ED Triage Notes (Signed)
Per Pt, Pt is coming from home. Pt reports having a syncopal episode two weeks ago. Pt was placed on a heart monitor. Today, pt reports "indigestion feeling" starting after he woke up this morning. Radiates to the right neck and arm. Reports decreasing since the initial episode.

## 2016-07-25 ENCOUNTER — Other Ambulatory Visit: Payer: Self-pay | Admitting: Cardiovascular Disease

## 2016-07-26 ENCOUNTER — Other Ambulatory Visit: Payer: Self-pay

## 2016-07-26 MED ORDER — LOSARTAN POTASSIUM 50 MG PO TABS: 75.0000 mg | ORAL_TABLET | Freq: Every day | ORAL | 3 refills | Status: DC

## 2016-08-05 DIAGNOSIS — L821 Other seborrheic keratosis: Secondary | ICD-10-CM | POA: Diagnosis not present

## 2016-08-05 DIAGNOSIS — D485 Neoplasm of uncertain behavior of skin: Secondary | ICD-10-CM | POA: Diagnosis not present

## 2016-08-05 DIAGNOSIS — B079 Viral wart, unspecified: Secondary | ICD-10-CM | POA: Diagnosis not present

## 2016-08-05 DIAGNOSIS — D225 Melanocytic nevi of trunk: Secondary | ICD-10-CM | POA: Diagnosis not present

## 2016-08-15 ENCOUNTER — Ambulatory Visit (INDEPENDENT_AMBULATORY_CARE_PROVIDER_SITE_OTHER): Payer: Self-pay | Admitting: Family Medicine

## 2016-08-15 VITALS — BP 128/80 | HR 74 | Temp 98.3°F | Resp 17 | Ht 72.0 in | Wt 225.0 lb

## 2016-08-15 DIAGNOSIS — Z024 Encounter for examination for driving license: Secondary | ICD-10-CM

## 2016-08-15 MED ORDER — LEVOTHYROXINE SODIUM 200 MCG PO TABS: ORAL_TABLET | ORAL | 0 refills | Status: DC

## 2016-08-15 NOTE — Patient Instructions (Addendum)
  Unfortunately I'm not able to complete a DOT card today as there are pending issues from cardiology with your history of heart failure and syncope event few months ago. Based on cardiology notes, they would like to repeat that echocardiogram and then determine if you may need a pacemaker/defibrillator. 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    IF you received an x-ray today, you will receive an invoice from [facility]. Please contact [facility] at [phone] with questions or concerns regarding your invoice.   IF you received labwork today, you will receive an invoice from [employer]. Please contact [facility] at [phone] with questions or concerns regarding your invoice.   Our billing staff will not be able to assist you with questions regarding bills from these companies.  You will be contacted with the lab results as soon as they are available. The fastest way to get your results is to activate your My Chart account. Instructions are located on the last page of this paperwork. If you have not heard from [place] regarding the results in 2 weeks, please contact this office.
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
## 2016-08-22 ENCOUNTER — Ambulatory Visit (HOSPITAL_COMMUNITY): Payer: Medicare Other | Attending: Cardiovascular Disease

## 2016-08-22 ENCOUNTER — Other Ambulatory Visit: Payer: Self-pay

## 2016-08-22 ENCOUNTER — Telehealth: Payer: Self-pay | Admitting: Cardiovascular Disease

## 2016-08-22 DIAGNOSIS — R55 Syncope and collapse: Secondary | ICD-10-CM | POA: Diagnosis not present

## 2016-08-22 DIAGNOSIS — I428 Other cardiomyopathies: Secondary | ICD-10-CM | POA: Insufficient documentation
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
## 2016-08-22 NOTE — Telephone Encounter (Signed)
Arthur [name] is returning your  Call , he said that he will try to answer the phone when he is called back

## 2016-08-22 NOTE — Telephone Encounter (Signed)
Returned pts call but he couldn't hear anything I was saying and he said he would try to call me right back.

## 2016-08-24 ENCOUNTER — Ambulatory Visit (INDEPENDENT_AMBULATORY_CARE_PROVIDER_SITE_OTHER): Payer: Medicare Other | Admitting: Cardiovascular Disease

## 2016-08-24 ENCOUNTER — Encounter: Payer: Self-pay | Admitting: Cardiovascular Disease

## 2016-08-24 VITALS — BP 146/80 | HR 60 | Ht 72.0 in | Wt 224.8 lb

## 2016-08-24 DIAGNOSIS — R55 Syncope and collapse: Secondary | ICD-10-CM

## 2016-08-24 DIAGNOSIS — Z01812 Encounter for preprocedural laboratory examination: Secondary | ICD-10-CM | POA: Diagnosis not present

## 2016-08-24 DIAGNOSIS — I5022 Chronic systolic (congestive) heart failure: Secondary | ICD-10-CM | POA: Diagnosis not present

## 2016-08-24 DIAGNOSIS — I447 Left bundle-branch block, unspecified: Secondary | ICD-10-CM | POA: Diagnosis not present

## 2016-08-24 LAB — CBC WITH DIFFERENTIAL/PLATELET
BASOS PCT: 0 %
Basophils Absolute: 0 cells/uL (ref 0–200)
EOS ABS: 456 {cells}/uL (ref 15–500)
Eosinophils Relative: 6 %
HEMATOCRIT: 40.9 % (ref 38.5–50.0)
HEMOGLOBIN: 13.7 g/dL (ref 13.2–17.1)
LYMPHS ABS: 2280 {cells}/uL (ref 850–3900)
LYMPHS PCT: 30 %
MCH: 30.2 pg (ref 27.0–33.0)
MCHC: 33.5 g/dL (ref 32.0–36.0)
MCV: 90.1 fL (ref 80.0–100.0)
MONO ABS: 684 {cells}/uL (ref 200–950)
MPV: 10.8 fL (ref 7.5–12.5)
Monocytes Relative: 9 %
NEUTROS PCT: 55 %
Neutro Abs: 4180 cells/uL (ref 1500–7800)
Platelets: 238 10*3/uL (ref 140–400)
RBC: 4.54 MIL/uL (ref 4.20–5.80)
RDW: 13.1 % (ref 11.0–15.0)
WBC: 7.6 10*3/uL (ref 3.8–10.8)

## 2016-08-24 LAB — PROTIME-INR
INR: 1
PROTHROMBIN TIME: 10.8 s (ref 9.0–11.5)

## 2016-08-24 LAB — BASIC METABOLIC PANEL
BUN: 14 mg/dL (ref 7–25)
CHLORIDE: 104 mmol/L (ref 98–110)
CO2: 31 mmol/L (ref 20–31)
Calcium: 9.6 mg/dL (ref 8.6–10.3)
Creat: 0.98 mg/dL (ref 0.70–1.18)
GLUCOSE: 99 mg/dL (ref 65–99)
POTASSIUM: 4 mmol/L (ref 3.5–5.3)
SODIUM: 143 mmol/L (ref 135–146)

## 2016-08-24 LAB — APTT: aPTT: 25 s (ref 22–34)

## 2016-08-24 NOTE — Patient Instructions (Signed)
Medication Instructions:  Your physician recommends that you continue on your current medications as directed. Please refer to the Current Medication list given to you today.   Labwork: TODAY: PRE CATH LABS  Testing/Procedures: Your physician has requested that you have a cardiac catheterization. Cardiac catheterization is used to diagnose and/or treat various heart conditions. Doctors may recommend this procedure for a number of different reasons. The most common reason is to evaluate chest pain. Chest pain can be a symptom of coronary artery disease (CAD), and cardiac catheterization can show whether plaque is narrowing or blocking your heart's arteries. This procedure is also used to evaluate the valves, as well as measure the blood flow and oxygen levels in different parts of your heart. For further information please visit [email or web address]. Please follow instruction sheet, as given.  Follow-Up: Your physician recommends that you schedule a follow-up appointment in: 3 MONTHS with [name].  Any Other Special Instructions Will Be Listed Below (If Applicable).     If you need a refill on your cardiac medications before your next appointment, please call your pharmacy.

## 2016-08-29 ENCOUNTER — Telehealth: Payer: Self-pay

## 2016-08-29 NOTE — Telephone Encounter (Signed)
What ARB is covered ??  Micardis, diovan ??

## 2016-08-29 NOTE — Telephone Encounter (Signed)
Called patient's pharmacy. Patient's insurance is covering the Cozaar now, so patient will not need an alterative medication. Insurance had an issue with the change of dosing, but [employer] pharmacist stated that has been resolved.

## 2016-08-29 NOTE — Telephone Encounter (Signed)
Patient is wanting to switch Cozaar to another alternative medication. This is no longer covered by insurance. Patient out of medication. Will forward to [name].

## 2016-09-02 ENCOUNTER — Ambulatory Visit (HOSPITAL_COMMUNITY)
Admission: RE | Admit: 2016-09-02 | Discharge: 2016-09-02 | Disposition: A | Discharge status: Home / Self Care | Payer: Medicare Other | Source: Ambulatory Visit | Attending: Cardiovascular Disease | Admitting: Cardiovascular Disease

## 2016-09-02 ENCOUNTER — Encounter (HOSPITAL_COMMUNITY)
Admission: RE | Disposition: A | Discharge status: Home / Self Care | Payer: Self-pay | Source: Ambulatory Visit | Attending: Cardiovascular Disease

## 2016-09-02 DIAGNOSIS — F17210 Nicotine dependence, cigarettes, uncomplicated: Secondary | ICD-10-CM | POA: Insufficient documentation

## 2016-09-02 DIAGNOSIS — I5022 Chronic systolic (congestive) heart failure: Secondary | ICD-10-CM | POA: Insufficient documentation

## 2016-09-02 DIAGNOSIS — J449 Chronic obstructive pulmonary disease, unspecified: Secondary | ICD-10-CM | POA: Insufficient documentation

## 2016-09-02 DIAGNOSIS — I428 Other cardiomyopathies: Secondary | ICD-10-CM | POA: Diagnosis not present

## 2016-09-02 DIAGNOSIS — I447 Left bundle-branch block, unspecified: Secondary | ICD-10-CM | POA: Diagnosis not present

## 2016-09-02 DIAGNOSIS — E039 Hypothyroidism, unspecified: Secondary | ICD-10-CM | POA: Diagnosis not present

## 2016-09-02 DIAGNOSIS — I110 Hypertensive heart disease with heart failure: Secondary | ICD-10-CM | POA: Insufficient documentation

## 2016-09-02 DIAGNOSIS — Z8546 Personal history of malignant neoplasm of prostate: Secondary | ICD-10-CM | POA: Insufficient documentation

## 2016-09-02 DIAGNOSIS — R55 Syncope and collapse: Secondary | ICD-10-CM | POA: Diagnosis not present

## 2016-09-02 DIAGNOSIS — Z79899 Other long term (current) drug therapy: Secondary | ICD-10-CM | POA: Diagnosis not present

## 2016-09-02 DIAGNOSIS — E785 Hyperlipidemia, unspecified: Secondary | ICD-10-CM | POA: Insufficient documentation

## 2016-09-02 DIAGNOSIS — Z85850 Personal history of malignant neoplasm of thyroid: Secondary | ICD-10-CM | POA: Insufficient documentation

## 2016-09-02 HISTORY — PX: CARDIAC CATHETERIZATION: SHX172

## 2016-09-02 SURGERY — LEFT HEART CATH AND CORONARY ANGIOGRAPHY

## 2016-09-02 MED ORDER — SODIUM CHLORIDE 0.9% FLUSH: 3.0000 mL | Freq: Two times a day (BID) | INTRAVENOUS | Status: DC

## 2016-09-02 MED ORDER — SODIUM CHLORIDE 0.9 % IV SOLN: INTRAVENOUS | Status: AC

## 2016-09-02 MED ORDER — FENTANYL CITRATE (PF) 100 MCG/2ML IJ SOLN
INTRAMUSCULAR | Status: AC
  Filled 2016-09-02: qty 2

## 2016-09-02 MED ORDER — VERAPAMIL HCL 2.5 MG/ML IV SOLN
INTRAVENOUS | Status: AC
  Filled 2016-09-02: qty 2

## 2016-09-02 MED ORDER — SODIUM CHLORIDE 0.9 % IV SOLN: 250.0000 mL | INTRAVENOUS | Status: DC | PRN

## 2016-09-02 MED ORDER — ONDANSETRON HCL 4 MG/2ML IJ SOLN: 4.0000 mg | Freq: Four times a day (QID) | INTRAMUSCULAR | Status: DC | PRN

## 2016-09-02 MED ORDER — HEPARIN SODIUM (PORCINE) 1000 UNIT/ML IJ SOLN
INTRAMUSCULAR | Status: DC | PRN
  Administered 2016-09-02: 5000 [IU] via INTRAVENOUS

## 2016-09-02 MED ORDER — HEPARIN (PORCINE) IN NACL 2-0.9 UNIT/ML-% IJ SOLN
INTRAMUSCULAR | Status: DC | PRN
  Administered 2016-09-02: 1000 mL

## 2016-09-02 MED ORDER — VERAPAMIL HCL 2.5 MG/ML IV SOLN
INTRAVENOUS | Status: DC | PRN
  Administered 2016-09-02: 10 mL via INTRA_ARTERIAL

## 2016-09-02 MED ORDER — HEPARIN SODIUM (PORCINE) 1000 UNIT/ML IJ SOLN
INTRAMUSCULAR | Status: AC
  Filled 2016-09-02: qty 1

## 2016-09-02 MED ORDER — SODIUM CHLORIDE 0.9% FLUSH: 3.0000 mL | INTRAVENOUS | Status: DC | PRN

## 2016-09-02 MED ORDER — LIDOCAINE HCL (PF) 1 % IJ SOLN
INTRAMUSCULAR | Status: DC | PRN
  Administered 2016-09-02: 2 mL via INTRADERMAL

## 2016-09-02 MED ORDER — ACETAMINOPHEN 325 MG PO TABS: 650.0000 mg | ORAL_TABLET | ORAL | Status: DC | PRN

## 2016-09-02 MED ORDER — HEPARIN (PORCINE) IN NACL 2-0.9 UNIT/ML-% IJ SOLN
INTRAMUSCULAR | Status: AC
  Filled 2016-09-02: qty 1000

## 2016-09-02 MED ORDER — DIAZEPAM 5 MG PO TABS: 5.0000 mg | ORAL_TABLET | ORAL | Status: DC | PRN

## 2016-09-02 MED ORDER — ASPIRIN 81 MG PO CHEW
81.0000 mg | CHEWABLE_TABLET | ORAL | Status: AC
  Administered 2016-09-02: 81 mg via ORAL

## 2016-09-02 MED ORDER — SODIUM CHLORIDE 0.9 % WEIGHT BASED INFUSION
3.0000 mL/kg/h | INTRAVENOUS | Status: AC
  Administered 2016-09-02: 3 mL/kg/h via INTRAVENOUS

## 2016-09-02 MED ORDER — IOPAMIDOL (ISOVUE-370) INJECTION 76%
INTRAVENOUS | Status: AC
  Filled 2016-09-02: qty 100

## 2016-09-02 MED ORDER — MIDAZOLAM HCL 2 MG/2ML IJ SOLN
INTRAMUSCULAR | Status: AC
  Filled 2016-09-02: qty 2

## 2016-09-02 MED ORDER — MIDAZOLAM HCL 2 MG/2ML IJ SOLN
INTRAMUSCULAR | Status: DC | PRN
  Administered 2016-09-02 (×2): 1 mg via INTRAVENOUS

## 2016-09-02 MED ORDER — IOPAMIDOL (ISOVUE-370) INJECTION 76%
INTRAVENOUS | Status: DC | PRN
Start: 2016-09-02 — End: 2016-09-02
  Administered 2016-09-02: 50 mL via INTRA_ARTERIAL

## 2016-09-02 MED ORDER — SODIUM CHLORIDE 0.9 % WEIGHT BASED INFUSION: 1.0000 mL/kg/h | INTRAVENOUS | Status: DC

## 2016-09-02 MED ORDER — ASPIRIN 81 MG PO CHEW
CHEWABLE_TABLET | ORAL | Status: AC
  Filled 2016-09-02: qty 1

## 2016-09-02 MED ORDER — LIDOCAINE HCL (PF) 1 % IJ SOLN
INTRAMUSCULAR | Status: AC
  Filled 2016-09-02: qty 30

## 2016-09-02 MED ORDER — FENTANYL CITRATE (PF) 100 MCG/2ML IJ SOLN
INTRAMUSCULAR | Status: DC | PRN
  Administered 2016-09-02 (×2): 25 ug via INTRAVENOUS

## 2016-09-02 SURGICAL SUPPLY — 12 items
CATH INFINITI 5FR ANG PIGTAIL (CATHETERS) ×3 IMPLANT
CATH INFINITI JR4 5F (CATHETERS) ×3 IMPLANT
CATH OPTITORQUE TIG 4.0 5F (CATHETERS) ×3 IMPLANT
DEVICE RAD COMP TR BAND LRG (VASCULAR PRODUCTS) ×3 IMPLANT
GLIDESHEATH SLEND SS 6F .021 (SHEATH) ×3 IMPLANT
GUIDEWIRE INQWIRE 1.5J.035X260 (WIRE) ×1 IMPLANT
INQWIRE 1.5J .035X260CM (WIRE) ×3
KIT HEART LEFT (KITS) ×3 IMPLANT
PACK CARDIAC CATHETERIZATION (CUSTOM PROCEDURE TRAY) ×3 IMPLANT
SYR MEDRAD MARK V 150ML (SYRINGE) ×3 IMPLANT
TRANSDUCER W/STOPCOCK (MISCELLANEOUS) ×3 IMPLANT
TUBING CIL FLEX 10 FLL-RA (TUBING) ×3 IMPLANT

## 2016-09-02 NOTE — Discharge Instructions (Signed)

## 2016-09-05 ENCOUNTER — Encounter (HOSPITAL_COMMUNITY): Payer: Self-pay | Admitting: Cardiovascular Disease

## 2016-09-09 ENCOUNTER — Telehealth: Payer: Self-pay | Admitting: Cardiovascular Disease
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
## 2016-09-09 NOTE — Telephone Encounter (Signed)
New message  Pt is calling for results from cardiac cath done on 12/1  Please call back

## 2016-09-12 NOTE — Telephone Encounter (Signed)
Cath showed no CAD Echo with mildly decreased EF 45-50% had syncope October So still shouldn't drive until March nothing we can do about that it is a state law f/u cardiac MRI In 6 months for EF make sure he is on ACE

## 2016-09-12 NOTE — Telephone Encounter (Signed)
Called patient to inform him of Dr. Kyla Balzarine advisement. Patient verbalized understand and confirmed that he is taking his Losartan.

## 2016-09-21 ENCOUNTER — Ambulatory Visit (INDEPENDENT_AMBULATORY_CARE_PROVIDER_SITE_OTHER): Payer: Medicare Other | Admitting: Cardiology

## 2016-09-21 ENCOUNTER — Encounter: Payer: Self-pay | Admitting: Cardiology

## 2016-09-21 VITALS — BP 168/90 | HR 60 | Ht 72.0 in | Wt 228.2 lb

## 2016-09-21 DIAGNOSIS — R55 Syncope and collapse: Secondary | ICD-10-CM | POA: Diagnosis not present

## 2016-09-21 DIAGNOSIS — I5022 Chronic systolic (congestive) heart failure: Secondary | ICD-10-CM | POA: Diagnosis not present

## 2016-09-21 MED ORDER — CARVEDILOL 12.5 MG PO TABS: 12.5000 mg | ORAL_TABLET | Freq: Two times a day (BID) | ORAL | 3 refills | Status: DC

## 2016-09-21 MED ORDER — LOSARTAN POTASSIUM 100 MG PO TABS: 100.0000 mg | ORAL_TABLET | Freq: Every day | ORAL | 3 refills | Status: DC

## 2016-09-21 NOTE — Patient Instructions (Addendum)
Medication Instructions:    Your physician has recommended you make the following change in your medication:  1) INCREASE Losartan to 100 mg daily 2) INCREASE Carvedilol to 12.5 mg twice a day  --- If you need a refill on your cardiac medications before your next appointment, please call your pharmacy. ---  Labwork:  None ordered  Testing/Procedures:  None ordered  Follow-Up:  Keep your follow up with [name] in February 2018.   No follow up is needed at this time with Dr. Curt Bears.  He will see you on an as needed basis.  Thank you for choosing [facility]!!   Trinidad Curet, RN [phone]
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
## 2016-10-03 HISTORY — PX: CHOLECYSTECTOMY: SHX55

## 2016-11-08 ENCOUNTER — Ambulatory Visit (INDEPENDENT_AMBULATORY_CARE_PROVIDER_SITE_OTHER): Payer: Medicare Other | Admitting: Family Medicine

## 2016-11-08 VITALS — BP 132/86 | HR 65 | Temp 98.5°F | Resp 17 | Ht 72.0 in | Wt 230.0 lb

## 2016-11-08 DIAGNOSIS — Z131 Encounter for screening for diabetes mellitus: Secondary | ICD-10-CM | POA: Diagnosis not present

## 2016-11-08 DIAGNOSIS — R739 Hyperglycemia, unspecified: Secondary | ICD-10-CM

## 2016-11-08 DIAGNOSIS — I10 Essential (primary) hypertension: Secondary | ICD-10-CM | POA: Diagnosis not present

## 2016-11-08 DIAGNOSIS — E039 Hypothyroidism, unspecified: Secondary | ICD-10-CM

## 2016-11-08 DIAGNOSIS — E785 Hyperlipidemia, unspecified: Secondary | ICD-10-CM | POA: Diagnosis not present

## 2016-11-08 MED ORDER — LEVOTHYROXINE SODIUM 200 MCG PO TABS: ORAL_TABLET | ORAL | 1 refills | Status: DC
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
## 2016-11-08 NOTE — Patient Instructions (Addendum)
No change in meds for now. If cholesterol is elevated, may need to restart cholesterol med, but can also be discussed at your upcoming cardiology appointment.  Return to the clinic or go to the nearest emergency room if any of your symptoms worsen or new symptoms occur.   IF you received an x-ray today, you will receive an invoice from [facility]. Please contact [facility] at [phone] with questions or concerns regarding your invoice.   IF you received labwork today, you will receive an invoice from [facility]. Please contact [facility] at [phone] with questions or concerns regarding your invoice.   Our billing staff will not be able to assist you with questions regarding bills from these companies.  You will be contacted with the lab results as soon as they are available. The fastest way to get your results is to activate your My Chart account. Instructions are located on the last page of this paperwork. If you have not heard from [place] regarding the results in 2 weeks, please contact this office.

## 2016-11-09 LAB — COMPREHENSIVE METABOLIC PANEL
A/G RATIO: 1.8 (ref 1.2–2.2)
ALT: 18 IU/L (ref 0–44)
AST: 19 IU/L (ref 0–40)
Albumin: 4.3 g/dL (ref 3.5–4.8)
Alkaline Phosphatase: 85 IU/L (ref 39–117)
BILIRUBIN TOTAL: 0.5 mg/dL (ref 0.0–1.2)
BUN/Creatinine Ratio: 16 (ref 10–24)
BUN: 18 mg/dL (ref 8–27)
CALCIUM: 9.7 mg/dL (ref 8.6–10.2)
CO2: 26 mmol/L (ref 18–29)
Chloride: 103 mmol/L (ref 96–106)
Creatinine, Ser: 1.1 mg/dL (ref 0.76–1.27)
GFR, EST AFRICAN AMERICAN: 76 mL/min/{1.73_m2} (ref >59)
GFR, EST NON AFRICAN AMERICAN: 66 mL/min/{1.73_m2} (ref >59)
GLOBULIN, TOTAL: 2.4 g/dL (ref 1.5–4.5)
Glucose: 98 mg/dL (ref 65–99)
POTASSIUM: 4.5 mmol/L (ref 3.5–5.2)
SODIUM: 142 mmol/L (ref 134–144)
Total Protein: 6.7 g/dL (ref 6.0–8.5)

## 2016-11-09 LAB — LIPID PANEL
CHOL/HDL RATIO: 5.4 ratio — AB (ref 0.0–5.0)
Cholesterol, Total: 178 mg/dL (ref 100–199)
HDL: 33 mg/dL — ABNORMAL LOW (ref >39)
LDL Calculated: 128 mg/dL — ABNORMAL HIGH (ref 0–99)
TRIGLYCERIDES: 87 mg/dL (ref 0–149)
VLDL Cholesterol Cal: 17 mg/dL (ref 5–40)

## 2016-11-09 LAB — HEMOGLOBIN A1C
ESTIMATED AVERAGE GLUCOSE: 117 mg/dL
Hgb A1c MFr Bld: 5.7 % — ABNORMAL HIGH (ref 4.8–5.6)

## 2016-11-09 LAB — TSH: TSH: 1.22 u[IU]/mL (ref 0.450–4.500)
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
## 2016-11-15 ENCOUNTER — Telehealth: Payer: Self-pay

## 2016-11-24 ENCOUNTER — Ambulatory Visit (INDEPENDENT_AMBULATORY_CARE_PROVIDER_SITE_OTHER): Payer: Medicare Other | Admitting: Cardiovascular Disease

## 2016-11-24 ENCOUNTER — Encounter: Payer: Self-pay | Admitting: Cardiovascular Disease

## 2016-11-24 ENCOUNTER — Encounter (INDEPENDENT_AMBULATORY_CARE_PROVIDER_SITE_OTHER): Payer: Self-pay

## 2016-11-24 VITALS — BP 150/80 | HR 65 | Ht 72.0 in | Wt 231.4 lb

## 2016-11-24 DIAGNOSIS — I5022 Chronic systolic (congestive) heart failure: Secondary | ICD-10-CM

## 2016-11-24 NOTE — Patient Instructions (Addendum)
Medication Instructions:  Your physician recommends that you continue on your current medications as directed. Please refer to the Current Medication list given to you today.  Labwork: NONE  Testing/Procedures: Your physician has requested that you have an echocardiogram in June. Echocardiography is a painless test that uses sound waves to create images of your heart. It provides your doctor with information about the size and shape of your heart and how well your heart's chambers and valves are working. This procedure takes approximately one hour. There are no restrictions for this procedure.  Follow-Up: Your physician wants you to follow-up in: 6 months with [name]. You will receive a reminder letter in the mail two months in advance. If you don't receive a letter, please call our office to schedule the follow-up appointment.   If you need a refill on your cardiac medications before your next appointment, please call your pharmacy.

## 2016-12-01 ENCOUNTER — Ambulatory Visit (INDEPENDENT_AMBULATORY_CARE_PROVIDER_SITE_OTHER): Payer: Medicare Other | Admitting: Family Medicine

## 2016-12-01 ENCOUNTER — Encounter: Payer: Self-pay | Admitting: Family Medicine

## 2016-12-01 VITALS — BP 152/90 | HR 62 | Temp 98.1°F | Ht 72.0 in | Wt 232.6 lb

## 2016-12-01 DIAGNOSIS — Z720 Tobacco use: Secondary | ICD-10-CM | POA: Diagnosis not present

## 2016-12-01 DIAGNOSIS — I10 Essential (primary) hypertension: Secondary | ICD-10-CM | POA: Diagnosis not present

## 2016-12-01 DIAGNOSIS — R112 Nausea with vomiting, unspecified: Secondary | ICD-10-CM | POA: Diagnosis not present

## 2016-12-01 DIAGNOSIS — E784 Other hyperlipidemia: Secondary | ICD-10-CM | POA: Diagnosis not present

## 2016-12-01 DIAGNOSIS — E7849 Other hyperlipidemia: Secondary | ICD-10-CM

## 2016-12-01 MED ORDER — ATORVASTATIN CALCIUM 10 MG PO TABS: 10.0000 mg | ORAL_TABLET | Freq: Every day | ORAL | 2 refills | Status: DC
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
## 2016-12-01 NOTE — Patient Instructions (Addendum)
Start Lipitor once per day. If any new side effects on that med, return to discuss other options.  If doing well, return for lab only visit in 6 weeks. Follow up with me in next 3 months.  Continue to work on quitting smoking.  Monitor your blood pressure outside of the office and if remaining over 140/90, return here or cardiology to discuss medication changes.  If any return of nausea vomiting, or abdominal pain, return for recheck.  Return for DOT paperwork if needed as you have been cleared by cardiology by their last note.       IF you received an x-ray today, you will receive an invoice from [facility]. Please contact [facility] at [phone] with questions or concerns regarding your invoice.   IF you received labwork today, you will receive an invoice from [facility]. Please contact [facility] at [phone] with questions or concerns regarding your invoice.   Our billing staff will not be able to assist you with questions regarding bills from these companies.  You will be contacted with the lab results as soon as they are available. The fastest way to get your results is to activate your My Chart account. Instructions are located on the last page of this paperwork. If you have not heard from [place] regarding the results in 2 weeks, please contact this office.

## 2016-12-06 ENCOUNTER — Ambulatory Visit (INDEPENDENT_AMBULATORY_CARE_PROVIDER_SITE_OTHER): Payer: Medicare Other | Admitting: Physician Assistant

## 2016-12-06 VITALS — BP 140/90 | HR 76 | Temp 98.5°F | Resp 18 | Ht 72.0 in | Wt 234.0 lb

## 2016-12-06 DIAGNOSIS — L723 Sebaceous cyst: Secondary | ICD-10-CM

## 2016-12-06 DIAGNOSIS — L089 Local infection of the skin and subcutaneous tissue, unspecified: Secondary | ICD-10-CM

## 2016-12-06 MED ORDER — SULFAMETHOXAZOLE-TRIMETHOPRIM 800-160 MG PO TABS: 1.0000 | ORAL_TABLET | Freq: Two times a day (BID) | ORAL | 0 refills | Status: DC

## 2016-12-06 NOTE — Patient Instructions (Addendum)
  I would like you to return tomorrow for your packing to be changed. Take the antibiotic as prescribed.   Incision and Drainage, Care After Refer to this sheet in the next few weeks. These instructions provide you with information about caring for yourself after your procedure. Your health care provider may also give you more specific instructions. Your treatment has been planned according to current medical practices, but problems sometimes occur. Call your health care provider if you have any problems or questions after your procedure. What can I expect after the procedure? After the procedure, it is common to have:  Pain or discomfort around your incision site.  Drainage from your incision. Follow these instructions at home:  Take over-the-counter and prescription medicines only as told by your health care provider.  If you were prescribed an antibiotic medicine, take it as told by your health care provider.Do not stop taking the antibiotic even if you start to feel better.  Followinstructions from your health care provider about:  How to take care of your incision.  When and how you should change your packing and bandage (dressing). Wash your hands with soap and water before you change your dressing. If soap and water are not available, use hand sanitizer.  When you should remove your dressing.  Do not take baths, swim, or use a hot tub until your health care provider approves.  Keep all follow-up visits as told by your health care provider. This is important.  Check your incision area every day for signs of infection. Check for:  More redness, swelling, or pain.  More fluid or blood.  Warmth.  Pus or a bad smell. Contact a health care provider if:  Your cyst or abscess returns.  You have a fever.  You have more redness, swelling, or pain around your incision.  You have more fluid or blood coming from your incision.  Your incision feels warm to the touch.  You  have pus or a bad smell coming from your incision. Get help right away if:  You have severe pain or bleeding.  You cannot eat or drink without vomiting.  You have decreased urine output.  You become short of breath.  You have chest pain.  You cough up blood.  The area where the incision and drainage occurred becomes numb or it tingles. This information is not intended to replace advice given to you by your health care provider. Make sure you discuss any questions you have with your health care provider. Document Released: 12/12/2011 Document Revised: 02/19/2016 Document Reviewed: 07/10/2015 Elsevier Interactive Patient Education  2017 [employer].    IF you received an x-ray today, you will receive an invoice from [facility]. Please contact [facility] at [phone] with questions or concerns regarding your invoice.   IF you received labwork today, you will receive an invoice from [place]. Please contact [facility] at [phone] with questions or concerns regarding your invoice.   Our billing staff will not be able to assist you with questions regarding bills from these companies.  You will be contacted with the lab results as soon as they are available. The fastest way to get your results is to activate your My Chart account. Instructions are located on the last page of this paperwork. If you have not heard from [place] regarding the results in 2 weeks, please contact this office.

## 2016-12-07 ENCOUNTER — Ambulatory Visit (INDEPENDENT_AMBULATORY_CARE_PROVIDER_SITE_OTHER): Payer: Medicare Other | Admitting: Physician Assistant

## 2016-12-07 VITALS — BP 149/80 | HR 64 | Temp 97.8°F | Resp 18 | Ht 72.0 in | Wt 233.0 lb

## 2016-12-07 DIAGNOSIS — Z5189 Encounter for other specified aftercare: Secondary | ICD-10-CM

## 2016-12-07 DIAGNOSIS — L723 Sebaceous cyst: Secondary | ICD-10-CM

## 2016-12-07 DIAGNOSIS — L089 Local infection of the skin and subcutaneous tissue, unspecified: Secondary | ICD-10-CM

## 2016-12-07 DIAGNOSIS — Z9889 Other specified postprocedural states: Secondary | ICD-10-CM

## 2016-12-07 NOTE — Patient Instructions (Addendum)
Please return in 2 days for follow up wound care.  Continue antibiotic.  Take tylenol for pain.
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
## 2016-12-08 ENCOUNTER — Encounter: Payer: Self-pay | Admitting: Physician Assistant

## 2016-12-09 ENCOUNTER — Ambulatory Visit (INDEPENDENT_AMBULATORY_CARE_PROVIDER_SITE_OTHER): Payer: Medicare Other | Admitting: Physician Assistant

## 2016-12-09 VITALS — BP 122/72 | HR 66 | Temp 98.2°F | Resp 17 | Ht 73.0 in | Wt 234.0 lb

## 2016-12-09 DIAGNOSIS — Z9889 Other specified postprocedural states: Secondary | ICD-10-CM

## 2016-12-09 DIAGNOSIS — Z5189 Encounter for other specified aftercare: Secondary | ICD-10-CM

## 2016-12-09 LAB — WOUND CULTURE

## 2016-12-09 NOTE — Progress Notes (Signed)
Chief Complaint  Patient presents with  . Follow-up    wound care    76 year old patient is here today for wound care of infected sebaceous cyst status post incision and drainage.  This is healing well.  He has had this changed once.  He has noticed no drainage, fever, or pain.  It does itch.    Physical Exam  Constitutional: He is oriented to person, place, and time. He appears well-developed and well-nourished. No distress.  HENT:  Head: Normocephalic and atraumatic.  Eyes: Conjunctivae and EOM are normal. Pupils are equal, round, and reactive to light.  Cardiovascular: Normal rate.   Pulmonary/Chest: Effort normal. No respiratory distress.  Musculoskeletal:  No necrosis or drainage.  Wound irrigated with normal saline.    Neurological: He is alert and oriented to person, place, and time.  Skin: Skin is warm and dry. He is not diaphoretic.  Psychiatric: He has a normal mood and affect. His behavior is normal.   Assessment   76 year old patient here for wound care.  This is healing well.  He can wash the wound at home.  Advised precautions to warrant return, and home care.  rtc as needed Status post incision and drainage  Encounter for wound care  Ivar Drape, PA-C [facility] 3/17/20189:03 AM

## 2016-12-09 NOTE — Patient Instructions (Addendum)
Have someone take the packing out on Sunday, and place the adhesive dressing then the tegaderm on top.  If it gets crumbled, you can use gauze and medical tape and then come back on Tuesday for recheck. You can stop the antibiotic at this time.     IF you received an x-ray today, you will receive an invoice from [facility]. Please contact [facility] at [phone] with questions or concerns regarding your invoice.   IF you received labwork today, you will receive an invoice from [place]. Please contact [facility] at [phone] with questions or concerns regarding your invoice.   Our billing staff will not be able to assist you with questions regarding bills from these companies.  You will be contacted with the lab results as soon as they are available. The fastest way to get your results is to activate your My Chart account. Instructions are located on the last page of this paperwork. If you have not heard from [place] regarding the results in 2 weeks, please contact this office.

## 2016-12-13 ENCOUNTER — Ambulatory Visit (INDEPENDENT_AMBULATORY_CARE_PROVIDER_SITE_OTHER): Payer: Medicare Other | Admitting: Physician Assistant

## 2016-12-13 ENCOUNTER — Other Ambulatory Visit (HOSPITAL_COMMUNITY): Payer: Medicare Other

## 2016-12-13 ENCOUNTER — Ambulatory Visit: Payer: Medicare Other

## 2016-12-13 VITALS — BP 146/88 | HR 71 | Temp 98.6°F | Resp 16 | Ht 73.0 in | Wt 228.0 lb

## 2016-12-13 DIAGNOSIS — Z9889 Other specified postprocedural states: Secondary | ICD-10-CM

## 2016-12-13 DIAGNOSIS — Z5189 Encounter for other specified aftercare: Secondary | ICD-10-CM

## 2016-12-13 NOTE — Patient Instructions (Signed)
     IF you received an x-ray today, you will receive an invoice from [facility]. Please contact [facility] at [phone] with questions or concerns regarding your invoice.   IF you received labwork today, you will receive an invoice from [facility]. Please contact [facility] at [phone] with questions or concerns regarding your invoice.   Our billing staff will not be able to assist you with questions regarding bills from these companies.  You will be contacted with the lab results as soon as they are available. The fastest way to get your results is to activate your My Chart account. Instructions are located on the last page of this paperwork. If you have not heard from us regarding the results in 2 weeks, please contact this office.     

## 2016-12-14 NOTE — Progress Notes (Signed)
Chief Complaint  Patient presents with  . Follow-up    wound check    Patient is here today for wound care status post incision and drainage of back infected sebaceous cyst.  This is day 7.  Patient has noticed itching at the site.  He had his grandson change the dressing and pull out the packing 2 days ago.  He notices no excessive drainage, redness, or swelling.  He has discontinued the abx.  No fever.  BP (!) 146/88   Pulse 71   Temp 98.6 F (37 C) (Oral)   Resp 16   Ht 6\' 1"  (1.854 m)   Wt 228 lb (103.4 kg)   SpO2 95%   BMI 30.08 kg/m   Physical Exam  Constitutional: He appears well-developed and well-nourished.  Skin:  Minimal purulent fluid expressed, then sanguinous fluid.  No necrotic tissue.  Wound irrigated with normal saline.  Dressing applied.   ASSESSMENT AND PLAN This is healing well.  Advised to wash the sound daily and place dressing until scabbed over.  Alarming sxs to warrant an immediate return were discussed. rtc as needed. Status post incision and drainage  Encounter for wound care  Ivar Drape, PA-C [facility] 3/14/201810:05 AM
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
## 2017-01-03 ENCOUNTER — Emergency Department (HOSPITAL_COMMUNITY): Payer: Medicare Other

## 2017-01-03 ENCOUNTER — Ambulatory Visit (INDEPENDENT_AMBULATORY_CARE_PROVIDER_SITE_OTHER): Payer: Medicare Other | Admitting: Family Medicine

## 2017-01-03 ENCOUNTER — Encounter (HOSPITAL_COMMUNITY): Payer: Self-pay | Admitting: Emergency Medicine

## 2017-01-03 ENCOUNTER — Ambulatory Visit (INDEPENDENT_AMBULATORY_CARE_PROVIDER_SITE_OTHER): Payer: Medicare Other

## 2017-01-03 ENCOUNTER — Emergency Department (HOSPITAL_COMMUNITY)
Admission: EM | Admit: 2017-01-03 | Discharge: 2017-01-03 | Disposition: A | Discharge status: Home / Self Care | Payer: Medicare Other | Attending: Emergency Medicine | Admitting: Emergency Medicine

## 2017-01-03 VITALS — BP 98/65 | HR 75 | Temp 98.0°F | Resp 20 | Ht 72.0 in | Wt 217.6 lb

## 2017-01-03 DIAGNOSIS — R5081 Fever presenting with conditions classified elsewhere: Secondary | ICD-10-CM | POA: Diagnosis not present

## 2017-01-03 DIAGNOSIS — D72829 Elevated white blood cell count, unspecified: Secondary | ICD-10-CM | POA: Diagnosis not present

## 2017-01-03 DIAGNOSIS — R05 Cough: Secondary | ICD-10-CM | POA: Diagnosis not present

## 2017-01-03 DIAGNOSIS — I110 Hypertensive heart disease with heart failure: Secondary | ICD-10-CM | POA: Insufficient documentation

## 2017-01-03 DIAGNOSIS — Z96653 Presence of artificial knee joint, bilateral: Secondary | ICD-10-CM | POA: Insufficient documentation

## 2017-01-03 DIAGNOSIS — J441 Chronic obstructive pulmonary disease with (acute) exacerbation: Secondary | ICD-10-CM

## 2017-01-03 DIAGNOSIS — R34 Anuria and oliguria: Secondary | ICD-10-CM

## 2017-01-03 DIAGNOSIS — F17210 Nicotine dependence, cigarettes, uncomplicated: Secondary | ICD-10-CM | POA: Insufficient documentation

## 2017-01-03 DIAGNOSIS — E869 Volume depletion, unspecified: Secondary | ICD-10-CM | POA: Diagnosis not present

## 2017-01-03 DIAGNOSIS — I959 Hypotension, unspecified: Secondary | ICD-10-CM | POA: Diagnosis not present

## 2017-01-03 DIAGNOSIS — I5022 Chronic systolic (congestive) heart failure: Secondary | ICD-10-CM | POA: Insufficient documentation

## 2017-01-03 DIAGNOSIS — Z8546 Personal history of malignant neoplasm of prostate: Secondary | ICD-10-CM | POA: Insufficient documentation

## 2017-01-03 DIAGNOSIS — R062 Wheezing: Secondary | ICD-10-CM | POA: Insufficient documentation

## 2017-01-03 DIAGNOSIS — R0602 Shortness of breath: Secondary | ICD-10-CM | POA: Diagnosis not present

## 2017-01-03 DIAGNOSIS — Z79899 Other long term (current) drug therapy: Secondary | ICD-10-CM | POA: Diagnosis not present

## 2017-01-03 DIAGNOSIS — J449 Chronic obstructive pulmonary disease, unspecified: Secondary | ICD-10-CM | POA: Insufficient documentation

## 2017-01-03 DIAGNOSIS — J988 Other specified respiratory disorders: Secondary | ICD-10-CM

## 2017-01-03 DIAGNOSIS — R42 Dizziness and giddiness: Secondary | ICD-10-CM | POA: Diagnosis not present

## 2017-01-03 DIAGNOSIS — R059 Cough, unspecified: Secondary | ICD-10-CM

## 2017-01-03 LAB — POCT CBC
Granulocyte percent: 75 %G (ref 37–80)
HCT, POC: 43.1 % — AB (ref 43.5–53.7)
HEMOGLOBIN: 14.8 g/dL (ref 14.1–18.1)
LYMPH, POC: 2 (ref 0.6–3.4)
MCH, POC: 30.4 pg (ref 27–31.2)
MCHC: 34.3 g/dL (ref 31.8–35.4)
MCV: 88.5 fL (ref 80–97)
MID (cbc): 0.7 (ref 0–0.9)
MPV: 8.2 fL (ref 0–99.8)
POC Granulocyte: 8 — AB (ref 2–6.9)
POC LYMPH PERCENT: 18.7 %L (ref 10–50)
POC MID %: 6.3 % (ref 0–12)
Platelet Count, POC: 283 10*3/uL (ref 142–424)
RBC: 4.87 M/uL (ref 4.69–6.13)
RDW, POC: 12.6 %
WBC: 10.6 10*3/uL — AB (ref 4.6–10.2)

## 2017-01-03 LAB — I-STAT CHEM 8, ED
BUN: 20 mg/dL (ref 6–20)
Calcium, Ion: 1.13 mmol/L — ABNORMAL LOW (ref 1.15–1.40)
Chloride: 105 mmol/L (ref 101–111)
Creatinine, Ser: 1.1 mg/dL (ref 0.61–1.24)
GLUCOSE: 101 mg/dL — AB (ref 65–99)
HCT: 41 % (ref 39.0–52.0)
HEMOGLOBIN: 13.9 g/dL (ref 13.0–17.0)
POTASSIUM: 3.9 mmol/L (ref 3.5–5.1)
SODIUM: 143 mmol/L (ref 135–145)
TCO2: 28 mmol/L (ref 0–100)

## 2017-01-03 LAB — COMPREHENSIVE METABOLIC PANEL
ALK PHOS: 93 U/L (ref 38–126)
ALT: 19 U/L (ref 17–63)
AST: 21 U/L (ref 15–41)
Albumin: 3.5 g/dL (ref 3.5–5.0)
Anion gap: 10 (ref 5–15)
BILIRUBIN TOTAL: 1 mg/dL (ref 0.3–1.2)
BUN: 18 mg/dL (ref 6–20)
CALCIUM: 9.2 mg/dL (ref 8.9–10.3)
CO2: 29 mmol/L (ref 22–32)
CREATININE: 1.07 mg/dL (ref 0.61–1.24)
Chloride: 103 mmol/L (ref 101–111)
GFR calc Af Amer: >60 mL/min (ref >60)
Glucose, Bld: 100 mg/dL — ABNORMAL HIGH (ref 65–99)
Potassium: 4.2 mmol/L (ref 3.5–5.1)
Sodium: 142 mmol/L (ref 135–145)
TOTAL PROTEIN: 6.9 g/dL (ref 6.5–8.1)

## 2017-01-03 LAB — I-STAT CG4 LACTIC ACID, ED: Lactic Acid, Venous: 1.19 mmol/L (ref 0.5–1.9)

## 2017-01-03 LAB — CBC WITH DIFFERENTIAL/PLATELET
BASOS ABS: 0 10*3/uL (ref 0.0–0.1)
Basophils Relative: 0 %
Eosinophils Absolute: 0.2 10*3/uL (ref 0.0–0.7)
Eosinophils Relative: 2 %
HEMATOCRIT: 42.4 % (ref 39.0–52.0)
Hemoglobin: 13.8 g/dL (ref 13.0–17.0)
LYMPHS ABS: 2.1 10*3/uL (ref 0.7–4.0)
LYMPHS PCT: 19 %
MCH: 29.1 pg (ref 26.0–34.0)
MCHC: 32.5 g/dL (ref 30.0–36.0)
MCV: 89.3 fL (ref 78.0–100.0)
MONO ABS: 0.6 10*3/uL (ref 0.1–1.0)
Monocytes Relative: 6 %
Neutro Abs: 8.4 10*3/uL — ABNORMAL HIGH (ref 1.7–7.7)
Neutrophils Relative %: 73 %
Platelets: 243 10*3/uL (ref 150–400)
RBC: 4.75 MIL/uL (ref 4.22–5.81)
RDW: 12.8 % (ref 11.5–15.5)
WBC: 11.3 10*3/uL — AB (ref 4.0–10.5)

## 2017-01-03 LAB — INFLUENZA PANEL BY PCR (TYPE A & B)
INFLAPCR: NEGATIVE
INFLBPCR: NEGATIVE

## 2017-01-03 LAB — I-STAT TROPONIN, ED: TROPONIN I, POC: 0 ng/mL (ref 0.00–0.08)

## 2017-01-03 MED ORDER — ALBUTEROL SULFATE HFA 108 (90 BASE) MCG/ACT IN AERS
2.0000 | INHALATION_SPRAY | Freq: Once | RESPIRATORY_TRACT | Status: AC
  Administered 2017-01-03: 2 via RESPIRATORY_TRACT
  Filled 2017-01-03: qty 6.7

## 2017-01-03 MED ORDER — IPRATROPIUM BROMIDE 0.02 % IN SOLN
0.5000 mg | Freq: Once | RESPIRATORY_TRACT | Status: AC
  Administered 2017-01-03: 0.5 mg via RESPIRATORY_TRACT

## 2017-01-03 MED ORDER — ALBUTEROL (5 MG/ML) CONTINUOUS INHALATION SOLN
15.0000 mg/h | INHALATION_SOLUTION | Freq: Once | RESPIRATORY_TRACT | Status: AC
  Administered 2017-01-03: 15 mg/h via RESPIRATORY_TRACT
  Filled 2017-01-03: qty 20

## 2017-01-03 MED ORDER — METHYLPREDNISOLONE SODIUM SUCC 125 MG IJ SOLR
125.0000 mg | Freq: Once | INTRAMUSCULAR | Status: AC
  Administered 2017-01-03: 125 mg via INTRAVENOUS
  Filled 2017-01-03: qty 2

## 2017-01-03 MED ORDER — PREDNISONE 20 MG PO TABS: ORAL_TABLET | ORAL | 0 refills | Status: DC

## 2017-01-03 MED ORDER — BENZONATATE 100 MG PO CAPS: 100.0000 mg | ORAL_CAPSULE | Freq: Three times a day (TID) | ORAL | 0 refills | Status: DC

## 2017-01-03 MED ORDER — ALBUTEROL SULFATE (2.5 MG/3ML) 0.083% IN NEBU
2.5000 mg | INHALATION_SOLUTION | Freq: Once | RESPIRATORY_TRACT | Status: AC
  Administered 2017-01-03: 2.5 mg via RESPIRATORY_TRACT

## 2017-01-03 MED ORDER — IPRATROPIUM BROMIDE 0.02 % IN SOLN
0.5000 mg | Freq: Once | RESPIRATORY_TRACT | Status: AC
  Administered 2017-01-03: 0.5 mg via RESPIRATORY_TRACT
  Filled 2017-01-03: qty 2.5

## 2017-01-03 MED ORDER — SODIUM CHLORIDE 0.9 % IV BOLUS (SEPSIS)
1000.0000 mL | Freq: Once | INTRAVENOUS | Status: AC
  Administered 2017-01-03: 1000 mL via INTRAVENOUS
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
## 2017-01-03 NOTE — ED Provider Notes (Signed)
I received the patient in turnover from Dr. Darl Householder, briefly patient having a URI like illness for the past few days. Went to see his primary care physician, who is concerned that he was hypoxic on exertion in the office and sent to the ED. Was given DuoNeb's and steroids with significant improvement. Ambulated and O2 sat stayed above 90. Patient states he feels much better and would like to try outpatient therapy. Chest x-ray without pneumonia. Burst of steroids albuterol inhaler PCP follow-up.   Deno Etienne, DO 01/03/17 1720

## 2017-01-03 NOTE — ED Notes (Signed)
Walked pt around Pod D and pt's O2 dropped to 90%.

## 2017-01-03 NOTE — ED Notes (Signed)
Pt stable, understands discharge instructions, and reasons for return.   

## 2017-01-03 NOTE — ED Triage Notes (Signed)
Pt arrives via [facility] from [place] for productive cough since Saturday, hx of copd, ems reported they were called out bc pts O2 sat dropped to 80% on room air with ambulation. Pt also presented with orthostatic changes with ems.  VSS pta. Pt received a total of 10 mg of albuterol and 400cc NS pta. Pt a/ox4, nad.

## 2017-01-03 NOTE — ED Notes (Signed)
Signature pad not working. Pt understands d/c instructions.
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
## 2017-01-03 NOTE — Patient Instructions (Addendum)
Further evaluation and likely IV fluid through the ER today. If they do discharge you, you can follow-up with [place] in the next 24-48 hours if needed. Keep follow-up with your pulmonologist as you likely will need to be on a daily inhaler for emphysema.   Chronic Obstructive Pulmonary Disease Exacerbation Chronic obstructive pulmonary disease (COPD) is a common lung condition in which airflow from the lungs is limited. COPD is a general term that can be used to describe many different lung problems that limit airflow, including chronic bronchitis and emphysema. COPD exacerbations are episodes when breathing symptoms become much worse and require extra treatment. Without treatment, COPD exacerbations can be life threatening, and frequent COPD exacerbations can cause further damage to your lungs. What are the causes?  Respiratory infections.  Exposure to smoke.  Exposure to air pollution, chemical fumes, or dust. Sometimes there is no apparent cause or trigger. What increases the risk?  Smoking cigarettes.  Older age.  Frequent prior COPD exacerbations. What are the signs or symptoms?  Increased coughing.  Increased thick spit (sputum) production.  Increased wheezing.  Increased shortness of breath.  Rapid breathing.  Chest tightness. How is this diagnosed? Your medical history, a physical exam, and tests will help your health care provider make a diagnosis. Tests may include:  A chest X-ray.  Basic lab tests.  Sputum testing.  An arterial blood gas test. How is this treated? Depending on the severity of your COPD exacerbation, you may need to be admitted to a hospital for treatment. Some of the treatments commonly used to treat COPD exacerbations are:  Antibiotic medicines.  Bronchodilators. These are drugs that expand the air passages. They may be given with an inhaler or nebulizer. Spacer devices may be needed to help improve drug delivery.  Corticosteroid  medicines.  Supplemental oxygen therapy.  Airway clearing techniques, such as noninvasive ventilation (NIV) and positive expiratory pressure (PEP). These provide respiratory support through a mask or other noninvasive device. Follow these instructions at home:  Do not smoke. Quitting smoking is very important to prevent COPD from getting worse and exacerbations from happening as often.  Avoid exposure to all substances that irritate the airway, especially to tobacco smoke.  If you were prescribed an antibiotic medicine, finish it all even if you start to feel better.  Take all medicines as directed by your health care provider.It is important to use correct technique with inhaled medicines.  Drink enough fluids to keep your urine clear or pale yellow (unless you have a medical condition that requires fluid restriction).  Use a cool mist vaporizer. This makes it easier to clear your chest when you cough.  If you have a home nebulizer and oxygen, continue to use them as directed.  Maintain all necessary vaccinations to prevent infections.  Exercise regularly.  Eat a healthy diet.  Keep all follow-up appointments as directed by your health care provider. Get help right away if:  You have worsening shortness of breath.  You have trouble talking.  You have severe chest pain.  You have blood in your sputum.  You have a fever.  You have weakness, vomit repeatedly, or faint.  You feel confused.  You continue to get worse. This information is not intended to replace advice given to you by your health care provider. Make sure you discuss any questions you have with your health care provider. Document Released: 07/17/2007 Document Revised: 02/25/2016 Document Reviewed: 05/24/2013 Elsevier Interactive Patient Education  2017 [employer]  Hypotension  Hypotension, commonly called low blood pressure, is when the force of blood pumping through your arteries is too weak. Arteries  are blood vessels that carry blood from the heart throughout the body. When blood pressure is too low, you may not get enough blood to your brain or to the rest of your organs. This can cause weakness, light-headedness, rapid heartbeat, and fainting. Depending on the cause and severity, hypotension may be harmless (benign) or cause serious problems (critical). What are the causes? Possible causes of hypotension include:  Blood loss.  Loss of body fluids (dehydration).  Heart problems.  Hormone (endocrine) problems.  Pregnancy.  Severe infection.  Lack of certain nutrients.  Severe allergic reactions (anaphylaxis).  Certain medicines, such as blood pressure medicine or medicines that make the body lose excess fluids (diuretics). Sometimes, hypotension can be caused by not taking medicine as directed, such as taking too much of a certain medicine. What increases the risk? Certain factors can make you more likely to develop hypotension, including:  Age. Risk increases as you get older.  Conditions that affect the heart or the central nervous system.  Taking certain medicines, such as blood pressure medicine or diuretics.  Being pregnant. What are the signs or symptoms? Symptoms of this condition may include:  Weakness.  Light-headedness.  Dizziness.  Blurred vision.  Fatigue.  Rapid heartbeat.  Fainting, in severe cases. How is this diagnosed? This condition is diagnosed based on:  Your medical history.  Your symptoms.  Your blood pressure measurement. Your health care provider will check your blood pressure when you are:  Lying down.  Sitting.  Standing. A blood pressure reading is recorded as two numbers, such as "120 over 80" (or 120/80). The first ("top") number is called the systolic pressure. It is a measure of the pressure in your arteries as your heart beats. The second ("bottom") number is called the diastolic pressure. It is a measure of the  pressure in your arteries when your heart relaxes between beats. Blood pressure is measured in a unit called mm Hg. Healthy blood pressure for adults is 120/80. If your blood pressure is below 90/60, you may be diagnosed with hypotension. Other information or tests that may be used to diagnose hypotension include:  Your other vital signs, such as your heart rate and temperature.  Blood tests.  Tilt table test. For this test, you will be safely secured to a table that moves you from a lying position to an upright position. Your heart rhythm and blood pressure will be monitored during the test. How is this treated? Treatment for this condition may include:  Changing your diet. This may involve eating more salt (sodium) or drinking more water.  Taking medicines to raise your blood pressure.  Changing the dosage of certain medicines you are taking that might be lowering your blood pressure.  Wearing compression stockings. These stockings help to prevent blood clots and reduce swelling in your legs. In some cases, you may need to go to the hospital for:  Fluid replacement. This means you will receive fluids through an IV tube.  Blood replacement. This means you will receive donated blood through an IV tube (transfusion).  Treating an infection or heart problems, if this applies.  Monitoring. You may need to be monitored while medicines that you are taking wear off. Follow these instructions at home: Eating and drinking    Drink enough fluid to keep your urine clear or pale yellow.  Eat a healthy diet and follow  instructions from your health care provider about eating or drinking restrictions. A healthy diet includes:  Fresh fruits and vegetables.  Whole grains.  Lean meats.  Low-fat dairy products.  Eat extra salt only as directed. Do not add extra salt to your diet unless your health care provider told you to do that.  Eat frequent, small meals.  Avoid standing up suddenly  after eating. Medicines   Take over-the-counter and prescription medicines only as told by your health care provider.  Follow instructions from your health care provider about changing the dosage of your current medicines, if this applies.  Do not stop or adjust any of your medicines on your own. General instructions   Wear compression stockings as told by your health care provider.  Get up slowly from lying down or sitting positions. This gives your blood pressure a chance to adjust.  Avoid hot showers and excessive heat as directed by your health care provider.  Return to your normal activities as told by your health care provider. Ask your health care provider what activities are safe for you.  Do not use any products that contain nicotine or tobacco, such as cigarettes and e-cigarettes. If you need help quitting, ask your health care provider.  Keep all follow-up visits as told by your health care provider. This is important. Contact a health care provider if:  You vomit.  You have diarrhea.  You have a fever for more than 2-3 days.  You feel more thirsty than usual.  You feel weak and tired. Get help right away if:  You have chest pain.  You have a fast or irregular heartbeat.  You develop numbness in any part of your body.  You cannot move your arms or your legs.  You have trouble speaking.  You become sweaty or feel light-headed.  You faint.  You feel short of breath.  You have trouble staying awake.  You feel confused. This information is not intended to replace advice given to you by your health care provider. Make sure you discuss any questions you have with your health care provider. Document Released: 09/19/2005 Document Revised: 04/08/2016 Document Reviewed: 03/10/2016 Elsevier Interactive Patient Education  2017 [employer].   IF you received an x-ray today, you will receive an invoice from [facility]. Please contact [facility]  [facility] at [phone] with questions or concerns regarding your invoice.   IF you received labwork today, you will receive an invoice from [place]. Please contact [facility] at [phone] with questions or concerns regarding your invoice.   Our billing staff will not be able to assist you with questions regarding bills from these companies.  You will be contacted with the lab results as soon as they are available. The fastest way to get your results is to activate your My Chart account. Instructions are located on the last page of this paperwork. If you have not heard from [place] regarding the results in 2 weeks, please contact this office.

## 2017-01-03 NOTE — Discharge Instructions (Signed)
Using inhaler 4 puffs every 4 hours while awake. Return for sudden worsening shortness of breath or need to use the inhaler more often.

## 2017-01-05 ENCOUNTER — Ambulatory Visit (INDEPENDENT_AMBULATORY_CARE_PROVIDER_SITE_OTHER): Payer: Medicare Other | Admitting: Family Medicine

## 2017-01-05 ENCOUNTER — Encounter: Payer: Self-pay | Admitting: Family Medicine

## 2017-01-05 VITALS — BP 145/75 | HR 77 | Temp 98.0°F | Resp 18 | Ht 72.0 in | Wt 222.0 lb

## 2017-01-05 DIAGNOSIS — J441 Chronic obstructive pulmonary disease with (acute) exacerbation: Secondary | ICD-10-CM | POA: Diagnosis not present

## 2017-01-05 DIAGNOSIS — E869 Volume depletion, unspecified: Secondary | ICD-10-CM

## 2017-01-05 MED ORDER — AZITHROMYCIN 250 MG PO TABS: ORAL_TABLET | ORAL | 0 refills | Status: DC
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
## 2017-01-05 NOTE — Patient Instructions (Addendum)
Lungs sound better today. Blood pressure is also more toward your typical range. Continue prednisone as prescribed from the emergency room, you can space out albuterol to every 6-8 hours as needed at this point as your lungs sound clear. If discolored mucus is not improving within the next 1-2 days, start azithromycin as prescribed.  Return to the clinic or go to the nearest emergency room if any of your symptoms worsen or new symptoms occur.   IF you received an x-ray today, you will receive an invoice from [facility]. Please contact [facility] at [phone] with questions or concerns regarding your invoice.   IF you received labwork today, you will receive an invoice from [place]. Please contact [facility] at [phone] with questions or concerns regarding your invoice.   Our billing staff will not be able to assist you with questions regarding bills from these companies.  You will be contacted with the lab results as soon as they are available. The fastest way to get your results is to activate your My Chart account. Instructions are located on the last page of this paperwork. If you have not heard from [place] regarding the results in 2 weeks, please contact this office.

## 2017-01-08 LAB — CULTURE, BLOOD (ROUTINE X 2)
CULTURE: NO GROWTH
Culture: NO GROWTH
Special Requests: ADEQUATE
Special Requests: ADEQUATE

## 2017-01-11 ENCOUNTER — Ambulatory Visit: Payer: Medicare Other | Admitting: Pulmonary Disease

## 2017-01-12 ENCOUNTER — Ambulatory Visit (INDEPENDENT_AMBULATORY_CARE_PROVIDER_SITE_OTHER): Payer: Medicare Other | Admitting: Adult Health

## 2017-01-12 ENCOUNTER — Ambulatory Visit: Payer: Medicare Other | Admitting: Pulmonary Disease

## 2017-01-12 ENCOUNTER — Encounter: Payer: Self-pay | Admitting: Adult Health

## 2017-01-12 DIAGNOSIS — J441 Chronic obstructive pulmonary disease with (acute) exacerbation: Secondary | ICD-10-CM | POA: Diagnosis not present

## 2017-01-12 DIAGNOSIS — J449 Chronic obstructive pulmonary disease, unspecified: Secondary | ICD-10-CM | POA: Insufficient documentation

## 2017-01-12 NOTE — Progress Notes (Signed)
@Patient  ID: Arthur Quince., male    DOB: July 30, 1941, 76 y.o.   MRN: [id number]  Chief Complaint  Patient presents with  . Follow-up    COPD     Referring provider: Wendie Agreste, MD  HPI: 76 year old male, former smoker-2017 , followed for COPD and multiple pulmonary nodules Has CHF (EF 35 % )   TEST  2D Echo 05/10/2016 Moderate to severe global reduction in LV function; mild LVH; grade 1 diastolic dysfunction with elevated LV filling pressure; mild biatrial enlargement; trace AI, MR and TR. EF 30-35% Spirometry 2016 >FEV1 66% , ratio 63.   01/12/2017 Follow up : COPD /ER follow up  Pt presents for an ER follow up . Seen in ER last week for COPD flare , CXR w/ no COPD changes. Tx w/ abx and steroids . He is feeling better , but still coughing .  Coughs keeps him up at night .  Was given ANORO last year but insurance did not cover this .   Walk test in office did not show any desats.      Allergies  Allergen Reactions  . Doxycycline Other (See Comments)    Esophagitis, severe gi bleed    Immunization History  Administered Date(s) Administered  . Influenza Split 08/01/2011, 08/06/2012  . Influenza, High Dose Seasonal PF 06/27/2016  . Influenza,inj,Quad PF,36+ Mos 06/27/2013, 09/18/2014, 06/08/2015  . Pneumococcal Conjugate-13 10/20/2015  . Pneumococcal Polysaccharide-23 01/10/2000, 10/26/2007  . Tdap 04/26/2010    Past Medical History:  Diagnosis Date  . Arthritis   . Bifascicular block    a. 1st degree AVB/LBBB.  [name] Chronic systolic CHF (congestive heart failure) ([place])   . COPD (chronic obstructive pulmonary disease) ([place])   . ED (erectile dysfunction)   . Essential hypertension, benign    chris guess  pcp  . GERD (gastroesophageal reflux disease)   . Hypercholesterolemia   . Kidney stone    renal stone  . LBBB (left bundle branch block) 10/2007  . Migraines   . Mild dietary indigestion   . NICM (nonischemic cardiomyopathy) ([place])   .  Nonischemic cardiomyopathy ([place])   . Prostate cancer ([place])   . Reflux esophagitis   . Shortness of breath dyspnea    W/ EXERTION   . Thyroid cancer ([place])   . Tobacco use disorder   . Unspecified hypothyroidism     Tobacco History: History  Smoking Status  . Former Smoker  . Packs/day: 0.50  . Years: 40.00  . Types: Cigarettes  . Quit date: 04/04/2016  Smokeless Tobacco  . Never Used    Comment: 4-5 cigs per week 06/27/16   Counseling given: Yes   Outpatient Encounter Prescriptions as of 01/12/2017  Medication Sig  . albuterol (PROVENTIL HFA;VENTOLIN HFA) 108 (90 Base) MCG/ACT inhaler Inhale 1-2 puffs into the lungs every 4 (four) hours as needed for wheezing or shortness of breath.  [name] atorvastatin (LIPITOR) 10 MG tablet Take 1 tablet (10 mg total) by mouth daily at 6 PM.  . benzonatate (TESSALON) 100 MG capsule Take 1 capsule (100 mg total) by mouth every 8 (eight) hours.  . carvedilol (COREG) 12.5 MG tablet Take 1 tablet (12.5 mg total) by mouth 2 (two) times daily.  [name] levothyroxine (SYNTHROID, LEVOTHROID) 200 MCG tablet take 1 tablet by mouth once daily . (Patient taking differently: Take 200 mcg by mouth daily before breakfast. )  . rizatriptan (MAXALT) 10 MG tablet Take 1 tablet (10 mg total) by mouth as needed  for migraine. May repeat in 2 hours if needed  . losartan (COZAAR) 100 MG tablet Take 1 tablet (100 mg total) by mouth daily.  . naproxen sodium (ANAPROX) 220 MG tablet Take 440 mg by mouth 2 (two) times daily as needed (for pain or headache).  . [DISCONTINUED] azithromycin (ZITHROMAX) 250 MG tablet Take 2 pills by mouth on day 1, then 1 pill by mouth per day on days 2 through 5. (Patient not taking: Reported on 01/12/2017)  . [DISCONTINUED] predniSONE (DELTASONE) 20 MG tablet 2 tabs po daily x 4 days (Patient not taking: Reported on 01/12/2017)   No facility-administered encounter medications on file as of 01/12/2017.      Review of Systems  Constitutional:   No   weight loss, night sweats,  Fevers, chills, + fatigue, or  lassitude.  HEENT:   No headaches,  Difficulty swallowing,  Tooth/dental problems, or  Sore throat,                No sneezing, itching, ear ache,  +nasal congestion, post nasal drip,   CV:  No chest pain,  Orthopnea, PND, swelling in lower extremities, anasarca, dizziness, palpitations, syncope.   GI  No heartburn, indigestion, abdominal pain, nausea, vomiting, diarrhea, change in bowel habits, loss of appetite, bloody stools.   Resp:    .  No chest wall deformity  Skin: no rash or lesions.  GU: no dysuria, change in color of urine, no urgency or frequency.  No flank pain, no hematuria   MS:  No joint pain or swelling.  No decreased range of motion.  No back pain.    Physical Exam  BP 136/76 (BP Location: Right Arm, Patient Position: Sitting, Cuff Size: Normal)   Pulse 72   Ht 6' (1.829 m)   Wt 222 lb 9.6 oz (101 kg)   SpO2 94%   BMI 30.19 kg/m   GEN: A/Ox3; pleasant , NAD, elderly    HEENT:  [place]/AT,  EACs-clear, TMs-wnl, NOSE-clear, THROAT-clear, no lesions, no postnasal drip or exudate noted.   NECK:  Supple w/ fair ROM; no JVD; normal carotid impulses w/o bruits; no thyromegaly or nodules palpated; no lymphadenopathy.    RESP  Decreased BS in bases ,  no accessory muscle use, no dullness to percussion  CARD:  RRR, no m/r/g, no peripheral edema, pulses intact, no cyanosis or clubbing.  GI:   Soft & nt; nml bowel sounds; no organomegaly or masses detected.   Musco: Warm bil, no deformities or joint swelling noted.   Neuro: alert, no focal deficits noted.    Skin: Warm, no lesions or rashes    Lab Results:   ProBNP No results found for: PROBNP  Imaging: Dg Chest 2 View  Result Date: 01/03/2017 CLINICAL DATA:  Cough and fever. History of COPD, thyroid malignancy, cardiomyopathy, current smoker. EXAM: CHEST  2 VIEW COMPARISON:  PA and lateral chest x-ray of July 04, 2016 FINDINGS: The lungs are  mildly hyperinflated. There is no focal infiltrate. There is no pleural effusion. The heart and pulmonary vascularity are normal. There is calcification in the wall of the aortic arch. The mediastinum is normal in width. There are postsurgical changes in the upper paratracheal region. The bony thorax exhibits no acute abnormality. IMPRESSION: COPD. No pneumonia, CHF, nor other acute cardiopulmonary abnormality. Thoracic aortic atherosclerosis. Electronically Signed   By: David  Martinique M.D.   On: 01/03/2017 10:55   Dg Chest Portable 1 View  Result Date: 01/03/2017 CLINICAL DATA:  Productive cough, shortness of breath, and low oxygen saturation for the past 4 days. History of COPD. EXAM: PORTABLE CHEST 1 VIEW COMPARISON:  Chest x-ray of January 03, 2017 FINDINGS: The lungs remain hyperinflated. There is no focal infiltrate. There is no pleural effusion, pneumothorax, or pneumomediastinum. The heart and pulmonary vascularity are normal. There is calcification in the wall of the aortic arch. The mediastinum is normal in width. The bony thorax exhibits no acute abnormality. IMPRESSION: COPD. No pneumonia, CHF, nor other acute cardiopulmonary abnormality. Thoracic aortic atherosclerosis. Electronically Signed   By: David  Martinique M.D.   On: 01/03/2017 13:08     Assessment & Plan:   COPD exacerbation ([place]) Recent flare now resolving   Plan  Patient Instructions  Mucinex DM Twice daily  As needed  Cough/congestion.  Tessalon Three times a day  As needed  Cough.  Begin [name] 2 puff daily . Rinse after use.  Follow up Dr. Elsworth Soho  In 6 -8 weeks and As needed   Please contact office for sooner follow up if symptoms do not improve or worsen or seek emergency care          Rexene Edison, NP 01/12/2017

## 2017-01-12 NOTE — Assessment & Plan Note (Signed)
Recent flare now resolving   Plan  Patient Instructions  Mucinex DM Twice daily  As needed  Cough/congestion.  Tessalon Three times a day  As needed  Cough.  Begin [name] 2 puff daily . Rinse after use.  Follow up Dr. Elsworth Soho  In 6 -8 weeks and As needed   Please contact office for sooner follow up if symptoms do not improve or worsen or seek emergency care

## 2017-01-12 NOTE — Patient Instructions (Addendum)
Mucinex DM Twice daily  As needed  Cough/congestion.  Tessalon Three times a day  As needed  Cough.  Begin [name] 2 puff daily . Rinse after use.  Follow up Dr. Elsworth Soho  In 6 -8 weeks and As needed   Please contact office for sooner follow up if symptoms do not improve or worsen or seek emergency care

## 2017-01-13 NOTE — Progress Notes (Signed)
Reviewed & agree with plan  

## 2017-03-09 ENCOUNTER — Ambulatory Visit (INDEPENDENT_AMBULATORY_CARE_PROVIDER_SITE_OTHER): Payer: Medicare Other | Admitting: Family Medicine

## 2017-03-09 ENCOUNTER — Encounter: Payer: Self-pay | Admitting: Family Medicine

## 2017-03-09 VITALS — BP 142/70 | HR 60 | Temp 97.0°F | Resp 16 | Ht 72.84 in | Wt 233.0 lb

## 2017-03-09 DIAGNOSIS — R0609 Other forms of dyspnea: Secondary | ICD-10-CM

## 2017-03-09 DIAGNOSIS — J449 Chronic obstructive pulmonary disease, unspecified: Secondary | ICD-10-CM | POA: Diagnosis not present

## 2017-03-09 DIAGNOSIS — E784 Other hyperlipidemia: Secondary | ICD-10-CM | POA: Diagnosis not present

## 2017-03-09 DIAGNOSIS — E785 Hyperlipidemia, unspecified: Secondary | ICD-10-CM

## 2017-03-09 DIAGNOSIS — I5022 Chronic systolic (congestive) heart failure: Secondary | ICD-10-CM

## 2017-03-09 DIAGNOSIS — E7849 Other hyperlipidemia: Secondary | ICD-10-CM

## 2017-03-09 DIAGNOSIS — R609 Edema, unspecified: Secondary | ICD-10-CM | POA: Diagnosis not present

## 2017-03-09 DIAGNOSIS — E039 Hypothyroidism, unspecified: Secondary | ICD-10-CM

## 2017-03-09 DIAGNOSIS — R600 Localized edema: Secondary | ICD-10-CM

## 2017-03-09 MED ORDER — LEVOTHYROXINE SODIUM 200 MCG PO TABS: ORAL_TABLET | ORAL | 1 refills | Status: DC

## 2017-03-09 MED ORDER — ATORVASTATIN CALCIUM 10 MG PO TABS: 10.0000 mg | ORAL_TABLET | Freq: Every day | ORAL | 1 refills | Status: DC
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
## 2017-03-09 NOTE — Patient Instructions (Addendum)
It appears that pulmonary wants you to still be on [name] - please call their office to have that prescribed and verify that they still want you to be on that medicine. They may have samples or coupons, or other options if that is too costly. Keep appointment in few weeks.   Continue Lipitor once per day. I will check your labs today.  For leg swelling, unlikely that is due to Lipitor. I would recommend following up with cardiology to discuss your swelling. The echo on Monday should also provide some information about your heart failure. I will check a heart failure test for now. Cutting back on sodium should also help.    Peripheral Edema Peripheral edema is swelling that is caused by a buildup of fluid. Peripheral edema most often affects the lower legs, ankles, and feet. It can also develop in the arms, hands, and face. The area of the body that has peripheral edema will look swollen. It may also feel heavy or warm. Your clothes may start to feel tight. Pressing on the area may make a temporary dent in your skin. You may not be able to move your arm or leg as much as usual. There are many causes of peripheral edema. It can be a complication of other diseases, such as congestive heart failure, kidney disease, or a problem with your blood circulation. It also can be a side effect of certain medicines. It often happens to women during pregnancy. Sometimes, the cause is not known. Treating the underlying condition is often the only treatment for peripheral edema. Follow these instructions at home: Pay attention to any changes in your symptoms. Take these actions to help with your discomfort:  Raise (elevate) your legs while you are sitting or lying down.  Move around often to prevent stiffness and to lessen swelling. Do not sit or stand for long periods of time.  Wear support stockings as told by your health care provider.  Follow instructions from your health care provider about limiting salt  (sodium) in your diet. Sometimes eating less salt can reduce swelling.  Take over-the-counter and prescription medicines only as told by your health care provider. Your health care provider may prescribe medicine to help your body get rid of excess water (diuretic).  Keep all follow-up visits as told by your health care provider. This is important.  Contact a health care provider if:  You have a fever.  Your edema starts suddenly or is getting worse, especially if you are pregnant or have a medical condition.  You have swelling in only one leg.  You have increased swelling and pain in your legs. Get help right away if:  You develop shortness of breath, especially when you are lying down.  You have pain in your chest or abdomen.  You feel weak.  You faint. This information is not intended to replace advice given to you by your health care provider. Make sure you discuss any questions you have with your health care provider. Document Released: 10/27/2004 Document Revised: 02/22/2016 Document Reviewed: 04/01/2015 Elsevier Interactive Patient Education  2018 [employer].        IF you received an x-ray today, you will receive an invoice from [facility]. Please contact [facility] at [phone] with questions or concerns regarding your invoice.   IF you received labwork today, you will receive an invoice from [place]. Please contact [facility] at [phone] with questions or concerns regarding your invoice.   Our billing staff will not be able  to assist you with questions regarding bills from these companies.  You will be contacted with the lab results as soon as they are available. The fastest way to get your results is to activate your My Chart account. Instructions are located on the last page of this paperwork. If you have not heard from [place] regarding the results in 2 weeks, please contact this office.

## 2017-03-10 LAB — BASIC METABOLIC PANEL
BUN / CREAT RATIO: 13 (ref 10–24)
BUN: 13 mg/dL (ref 8–27)
CHLORIDE: 104 mmol/L (ref 96–106)
CO2: 24 mmol/L (ref 18–29)
Calcium: 9.1 mg/dL (ref 8.6–10.2)
Creatinine, Ser: 1.03 mg/dL (ref 0.76–1.27)
GFR calc Af Amer: 82 mL/min/{1.73_m2} (ref >59)
GFR calc non Af Amer: 71 mL/min/{1.73_m2} (ref >59)
GLUCOSE: 80 mg/dL (ref 65–99)
POTASSIUM: 3.8 mmol/L (ref 3.5–5.2)
SODIUM: 145 mmol/L — AB (ref 134–144)

## 2017-03-10 LAB — PRO B NATRIURETIC PEPTIDE: NT-PRO BNP: 275 pg/mL (ref 0–376)

## 2017-03-10 LAB — LIPID PANEL
CHOLESTEROL TOTAL: 133 mg/dL (ref 100–199)
Chol/HDL Ratio: 3.6 ratio (ref 0.0–5.0)
HDL: 37 mg/dL — ABNORMAL LOW (ref >39)
LDL Calculated: 71 mg/dL (ref 0–99)
Triglycerides: 127 mg/dL (ref 0–149)
VLDL Cholesterol Cal: 25 mg/dL (ref 5–40)

## 2017-03-10 LAB — TSH: TSH: 2.17 u[IU]/mL (ref 0.450–4.500)

## 2017-03-11 NOTE — Progress Notes (Signed)
Results given.

## 2017-03-13 ENCOUNTER — Ambulatory Visit (HOSPITAL_COMMUNITY): Payer: Medicare Other | Attending: Cardiology

## 2017-03-13 ENCOUNTER — Other Ambulatory Visit: Payer: Self-pay

## 2017-03-13 DIAGNOSIS — I110 Hypertensive heart disease with heart failure: Secondary | ICD-10-CM | POA: Insufficient documentation

## 2017-03-13 DIAGNOSIS — Z720 Tobacco use: Secondary | ICD-10-CM | POA: Insufficient documentation

## 2017-03-13 DIAGNOSIS — J449 Chronic obstructive pulmonary disease, unspecified: Secondary | ICD-10-CM | POA: Insufficient documentation

## 2017-03-13 DIAGNOSIS — I5022 Chronic systolic (congestive) heart failure: Secondary | ICD-10-CM

## 2017-03-13 DIAGNOSIS — I5021 Acute systolic (congestive) heart failure: Secondary | ICD-10-CM | POA: Diagnosis present

## 2017-03-13 DIAGNOSIS — I080 Rheumatic disorders of both mitral and aortic valves: Secondary | ICD-10-CM | POA: Insufficient documentation

## 2017-03-14 ENCOUNTER — Telehealth: Payer: Self-pay

## 2017-03-14 NOTE — Telephone Encounter (Signed)
Spoke with patient about echo results. Patient verbalized understanding. Patient agreed to continue on current medications. Scheduled patients recall appointment for august 22nd @ 8am.

## 2017-03-14 NOTE — Telephone Encounter (Signed)
-----   Message from [name], MD sent at 03/14/2017  1:10 PM EDT ----- No change in echo EF 45% continue medical Rx

## 2017-03-20 ENCOUNTER — Encounter: Payer: Self-pay | Admitting: Pulmonary Disease

## 2017-03-20 ENCOUNTER — Ambulatory Visit (INDEPENDENT_AMBULATORY_CARE_PROVIDER_SITE_OTHER): Payer: Medicare Other | Admitting: Pulmonary Disease

## 2017-03-20 DIAGNOSIS — R918 Other nonspecific abnormal finding of lung field: Secondary | ICD-10-CM | POA: Diagnosis not present

## 2017-03-20 DIAGNOSIS — J432 Centrilobular emphysema: Secondary | ICD-10-CM

## 2017-03-20 MED ORDER — TIOTROPIUM BROMIDE-OLODATEROL 2.5-2.5 MCG/ACT IN AERS: 2.0000 | INHALATION_SPRAY | Freq: Every day | RESPIRATORY_TRACT | 0 refills | Status: DC

## 2017-03-20 MED ORDER — TIOTROPIUM BROMIDE-OLODATEROL 2.5-2.5 MCG/ACT IN AERS: 2.0000 | INHALATION_SPRAY | Freq: Every day | RESPIRATORY_TRACT | 4 refills | Status: DC

## 2017-03-20 NOTE — Progress Notes (Signed)
   Subjective:    Patient ID: [name], male    DOB: 1941-05-23, 76 y.o.   MRN: [id number]  HPI   76 year old male, former smoker-2017 , followed for COPD and multiple pulmonary nodules Has CHF (EF 35 % )   Was given ANORO 2017 but insurance did not cover this . He was given a sample of stiolto last visit but says that prescription was never called in His dyspnea continues to be baseline. He had an episode of exacerbation 12/2016 and was in bed for a few days.   I reviewed prior CT scans and chest x-ray from 01/2017  Significant tests/ events reviewed  2D Echo 05/2016 Moderate to severe global reduction in LV function; mild LVH; grade 1 diastolic dysfunction with elevated LV filling pressure; mild biatrial enlargement; trace AI, MR and TR. EF 30-35% Spirometry 2016 >FEV1 66% , ratio 63.   CT chest 09/2015-nodule stable or decreased compared to 02/2015    Review of Systems neg for any significant sore throat, dysphagia, itching, sneezing, nasal congestion or excess/ purulent secretions, fever, chills, sweats, unintended wt loss, pleuritic or exertional cp, hempoptysis, orthopnea pnd or change in chronic leg swelling. Also denies presyncope, palpitations, heartburn, abdominal pain, nausea, vomiting, diarrhea or change in bowel or urinary habits, dysuria,hematuria, rash, arthralgias, visual complaints, headache, numbness weakness or ataxia.     Objective:   Physical Exam   Gen. Pleasant, well-nourished, in no distress ENT - no thrush, no post nasal drip Neck: No JVD, no thyromegaly, no carotid bruits Lungs: no use of accessory muscles, no dullness to percussion, clear without rales or rhonchi  Cardiovascular: Rhythm regular, heart sounds  normal, no murmurs or gallops, no peripheral edema Musculoskeletal: No deformities, no cyanosis or clubbing         Assessment & Plan:

## 2017-03-20 NOTE — Addendum Note (Signed)
Addended by: Valerie Salts on: 03/20/2017 03:00 PM   Modules accepted: Orders

## 2017-03-20 NOTE — Assessment & Plan Note (Signed)
Stable and decreased on prior CT  -can follow-up on chest x-ray

## 2017-03-20 NOTE — Patient Instructions (Signed)
Sample of [place] - prescription will be sent to pharmacy

## 2017-03-20 NOTE — Assessment & Plan Note (Signed)
Sample of [place] - prescription will be sent to pharmacy Albuterol prn only

## 2017-03-21 ENCOUNTER — Telehealth: Payer: Self-pay

## 2017-03-21 NOTE — Telephone Encounter (Signed)
PA started for [name] through covermymeds. PA sent to plan and will await response.  Route to [place], Dr. Bari Mantis nurse

## 2017-03-21 NOTE — Telephone Encounter (Signed)
Key for PA is #[id number]. Per CMM, PA was approved.   [facility] on [address] is aware of approval.

## 2017-03-29 ENCOUNTER — Telehealth: Payer: Self-pay | Admitting: Pulmonary Disease

## 2017-03-29 DIAGNOSIS — R0789 Other chest pain: Secondary | ICD-10-CM | POA: Diagnosis not present

## 2017-03-29 MED ORDER — TIOTROPIUM BROMIDE-OLODATEROL 2.5-2.5 MCG/ACT IN AERS: 2.0000 | INHALATION_SPRAY | Freq: Every day | RESPIRATORY_TRACT | 0 refills | Status: DC

## 2017-03-29 NOTE — Telephone Encounter (Signed)
Spoke with pt and he stated he got the approval letter for [employer] but he found out it would cost him $200 a month and he can not afford this. Samples were given to pt, pt will call his insurance to find out covered alternatives.

## 2017-03-30 ENCOUNTER — Encounter (HOSPITAL_COMMUNITY)
Admission: EM | Disposition: A | Discharge status: Home / Self Care | Payer: Self-pay | Source: Home / Self Care | Attending: Family Medicine

## 2017-03-30 ENCOUNTER — Inpatient Hospital Stay (HOSPITAL_COMMUNITY): Payer: Medicare Other | Admitting: Anesthesiology

## 2017-03-30 ENCOUNTER — Encounter (HOSPITAL_COMMUNITY): Payer: Self-pay | Admitting: *Deleted

## 2017-03-30 ENCOUNTER — Inpatient Hospital Stay (HOSPITAL_COMMUNITY)
Admission: EM | Admit: 2017-03-30 | Discharge: 2017-04-03 | DRG: 418 | Disposition: A | Discharge status: Home / Self Care | Payer: Medicare Other | Attending: Family Medicine | Admitting: Family Medicine

## 2017-03-30 ENCOUNTER — Emergency Department (HOSPITAL_COMMUNITY): Payer: Medicare Other

## 2017-03-30 DIAGNOSIS — K8020 Calculus of gallbladder without cholecystitis without obstruction: Secondary | ICD-10-CM

## 2017-03-30 DIAGNOSIS — K8001 Calculus of gallbladder with acute cholecystitis with obstruction: Secondary | ICD-10-CM | POA: Diagnosis not present

## 2017-03-30 DIAGNOSIS — Z8546 Personal history of malignant neoplasm of prostate: Secondary | ICD-10-CM

## 2017-03-30 DIAGNOSIS — K8010 Calculus of gallbladder with chronic cholecystitis without obstruction: Secondary | ICD-10-CM | POA: Diagnosis not present

## 2017-03-30 DIAGNOSIS — K824 Cholesterolosis of gallbladder: Secondary | ICD-10-CM | POA: Diagnosis not present

## 2017-03-30 DIAGNOSIS — R1084 Generalized abdominal pain: Secondary | ICD-10-CM | POA: Diagnosis not present

## 2017-03-30 DIAGNOSIS — Z9079 Acquired absence of other genital organ(s): Secondary | ICD-10-CM

## 2017-03-30 DIAGNOSIS — K567 Ileus, unspecified: Secondary | ICD-10-CM | POA: Diagnosis not present

## 2017-03-30 DIAGNOSIS — I5022 Chronic systolic (congestive) heart failure: Secondary | ICD-10-CM | POA: Diagnosis present

## 2017-03-30 DIAGNOSIS — J441 Chronic obstructive pulmonary disease with (acute) exacerbation: Secondary | ICD-10-CM

## 2017-03-30 DIAGNOSIS — R001 Bradycardia, unspecified: Secondary | ICD-10-CM | POA: Diagnosis present

## 2017-03-30 DIAGNOSIS — N200 Calculus of kidney: Secondary | ICD-10-CM | POA: Diagnosis present

## 2017-03-30 DIAGNOSIS — R0789 Other chest pain: Secondary | ICD-10-CM | POA: Diagnosis not present

## 2017-03-30 DIAGNOSIS — I110 Hypertensive heart disease with heart failure: Secondary | ICD-10-CM | POA: Diagnosis present

## 2017-03-30 DIAGNOSIS — Z85850 Personal history of malignant neoplasm of thyroid: Secondary | ICD-10-CM | POA: Diagnosis not present

## 2017-03-30 DIAGNOSIS — Z79899 Other long term (current) drug therapy: Secondary | ICD-10-CM | POA: Diagnosis not present

## 2017-03-30 DIAGNOSIS — K810 Acute cholecystitis: Secondary | ICD-10-CM | POA: Diagnosis not present

## 2017-03-30 DIAGNOSIS — R1013 Epigastric pain: Secondary | ICD-10-CM | POA: Diagnosis present

## 2017-03-30 DIAGNOSIS — N179 Acute kidney failure, unspecified: Secondary | ICD-10-CM | POA: Diagnosis not present

## 2017-03-30 DIAGNOSIS — R1011 Right upper quadrant pain: Secondary | ICD-10-CM

## 2017-03-30 DIAGNOSIS — J449 Chronic obstructive pulmonary disease, unspecified: Secondary | ICD-10-CM | POA: Diagnosis present

## 2017-03-30 DIAGNOSIS — K219 Gastro-esophageal reflux disease without esophagitis: Secondary | ICD-10-CM | POA: Diagnosis present

## 2017-03-30 DIAGNOSIS — I509 Heart failure, unspecified: Secondary | ICD-10-CM | POA: Diagnosis not present

## 2017-03-30 DIAGNOSIS — Z87891 Personal history of nicotine dependence: Secondary | ICD-10-CM

## 2017-03-30 DIAGNOSIS — I429 Cardiomyopathy, unspecified: Secondary | ICD-10-CM | POA: Diagnosis present

## 2017-03-30 DIAGNOSIS — I447 Left bundle-branch block, unspecified: Secondary | ICD-10-CM | POA: Diagnosis present

## 2017-03-30 DIAGNOSIS — Z833 Family history of diabetes mellitus: Secondary | ICD-10-CM | POA: Diagnosis not present

## 2017-03-30 DIAGNOSIS — Z96653 Presence of artificial knee joint, bilateral: Secondary | ICD-10-CM | POA: Diagnosis present

## 2017-03-30 DIAGNOSIS — I10 Essential (primary) hypertension: Secondary | ICD-10-CM

## 2017-03-30 DIAGNOSIS — R112 Nausea with vomiting, unspecified: Secondary | ICD-10-CM | POA: Diagnosis not present

## 2017-03-30 DIAGNOSIS — E890 Postprocedural hypothyroidism: Secondary | ICD-10-CM | POA: Diagnosis present

## 2017-03-30 DIAGNOSIS — E785 Hyperlipidemia, unspecified: Secondary | ICD-10-CM | POA: Diagnosis present

## 2017-03-30 DIAGNOSIS — Z8249 Family history of ischemic heart disease and other diseases of the circulatory system: Secondary | ICD-10-CM

## 2017-03-30 DIAGNOSIS — R079 Chest pain, unspecified: Secondary | ICD-10-CM | POA: Diagnosis not present

## 2017-03-30 DIAGNOSIS — K9189 Other postprocedural complications and disorders of digestive system: Secondary | ICD-10-CM

## 2017-03-30 DIAGNOSIS — K8011 Calculus of gallbladder with chronic cholecystitis with obstruction: Secondary | ICD-10-CM | POA: Diagnosis not present

## 2017-03-30 DIAGNOSIS — N39498 Other specified urinary incontinence: Secondary | ICD-10-CM | POA: Diagnosis present

## 2017-03-30 HISTORY — PX: CHOLECYSTECTOMY: SHX55

## 2017-03-30 HISTORY — DX: Toxic effect of unspecified spider venom, accidental (unintentional), initial encounter: T63.301A

## 2017-03-30 LAB — CBC
HEMATOCRIT: 44.9 % (ref 39.0–52.0)
Hemoglobin: 14.4 g/dL (ref 13.0–17.0)
MCH: 29.2 pg (ref 26.0–34.0)
MCHC: 32.1 g/dL (ref 30.0–36.0)
MCV: 91.1 fL (ref 78.0–100.0)
Platelets: 213 10*3/uL (ref 150–400)
RBC: 4.93 MIL/uL (ref 4.22–5.81)
RDW: 13.2 % (ref 11.5–15.5)
WBC: 11.3 10*3/uL — ABNORMAL HIGH (ref 4.0–10.5)

## 2017-03-30 LAB — CBC WITH DIFFERENTIAL/PLATELET
Basophils Absolute: 0 10*3/uL (ref 0.0–0.1)
Basophils Relative: 0 %
Eosinophils Absolute: 0.3 10*3/uL (ref 0.0–0.7)
Eosinophils Relative: 2 %
HEMATOCRIT: 40.8 % (ref 39.0–52.0)
HEMOGLOBIN: 13 g/dL (ref 13.0–17.0)
LYMPHS ABS: 1.9 10*3/uL (ref 0.7–4.0)
LYMPHS PCT: 15 %
MCH: 28.9 pg (ref 26.0–34.0)
MCHC: 31.9 g/dL (ref 30.0–36.0)
MCV: 90.7 fL (ref 78.0–100.0)
Monocytes Absolute: 0.7 10*3/uL (ref 0.1–1.0)
Monocytes Relative: 5 %
NEUTROS ABS: 9.9 10*3/uL — AB (ref 1.7–7.7)
NEUTROS PCT: 78 %
Platelets: 200 10*3/uL (ref 150–400)
RBC: 4.5 MIL/uL (ref 4.22–5.81)
RDW: 13.1 % (ref 11.5–15.5)
WBC: 12.8 10*3/uL — AB (ref 4.0–10.5)

## 2017-03-30 LAB — TROPONIN I: Troponin I: <0.03 ng/mL (ref <0.03)

## 2017-03-30 LAB — CREATININE, SERUM
CREATININE: 1.06 mg/dL (ref 0.61–1.24)
GFR calc Af Amer: >60 mL/min (ref >60)

## 2017-03-30 LAB — COMPREHENSIVE METABOLIC PANEL
ALT: 20 U/L (ref 17–63)
AST: 19 U/L (ref 15–41)
Albumin: 3.8 g/dL (ref 3.5–5.0)
Alkaline Phosphatase: 83 U/L (ref 38–126)
Anion gap: 7 (ref 5–15)
BUN: 21 mg/dL — ABNORMAL HIGH (ref 6–20)
CHLORIDE: 107 mmol/L (ref 101–111)
CO2: 27 mmol/L (ref 22–32)
Calcium: 9.5 mg/dL (ref 8.9–10.3)
Creatinine, Ser: 1.24 mg/dL (ref 0.61–1.24)
GFR calc non Af Amer: 56 mL/min — ABNORMAL LOW (ref >60)
Glucose, Bld: 129 mg/dL — ABNORMAL HIGH (ref 65–99)
POTASSIUM: 4.5 mmol/L (ref 3.5–5.1)
SODIUM: 141 mmol/L (ref 135–145)
Total Bilirubin: 0.7 mg/dL (ref 0.3–1.2)
Total Protein: 6.7 g/dL (ref 6.5–8.1)

## 2017-03-30 LAB — I-STAT TROPONIN, ED: Troponin i, poc: 0.01 ng/mL (ref 0.00–0.08)

## 2017-03-30 LAB — LIPASE, BLOOD: Lipase: 33 U/L (ref 11–51)

## 2017-03-30 SURGERY — LAPAROSCOPIC CHOLECYSTECTOMY WITH INTRAOPERATIVE CHOLANGIOGRAM
Anesthesia: General | Site: Abdomen

## 2017-03-30 MED ORDER — ENOXAPARIN SODIUM 40 MG/0.4ML ~~LOC~~ SOLN: 40.0000 mg | SUBCUTANEOUS | Status: DC

## 2017-03-30 MED ORDER — ROCURONIUM BROMIDE 10 MG/ML (PF) SYRINGE
PREFILLED_SYRINGE | INTRAVENOUS | Status: AC
  Filled 2017-03-30: qty 5

## 2017-03-30 MED ORDER — ONDANSETRON HCL 4 MG/2ML IJ SOLN
4.0000 mg | Freq: Once | INTRAMUSCULAR | Status: AC
  Administered 2017-03-30: 4 mg via INTRAVENOUS
  Filled 2017-03-30: qty 2

## 2017-03-30 MED ORDER — LIDOCAINE 2% (20 MG/ML) 5 ML SYRINGE
INTRAMUSCULAR | Status: AC
  Filled 2017-03-30: qty 5

## 2017-03-30 MED ORDER — SODIUM CHLORIDE 0.9 % IR SOLN
Status: DC | PRN
  Administered 2017-03-30: 1000 mL

## 2017-03-30 MED ORDER — CEFAZOLIN SODIUM-DEXTROSE 2-3 GM-% IV SOLR
INTRAVENOUS | Status: DC | PRN
  Administered 2017-03-30: 2 g via INTRAVENOUS

## 2017-03-30 MED ORDER — ONDANSETRON HCL 4 MG/2ML IJ SOLN
INTRAMUSCULAR | Status: AC
  Filled 2017-03-30: qty 4

## 2017-03-30 MED ORDER — LOSARTAN POTASSIUM 50 MG PO TABS
100.0000 mg | ORAL_TABLET | Freq: Every day | ORAL | Status: DC
  Administered 2017-03-31 – 2017-04-03 (×4): 100 mg via ORAL
  Filled 2017-03-30 (×4): qty 2

## 2017-03-30 MED ORDER — ROCURONIUM BROMIDE 10 MG/ML (PF) SYRINGE
PREFILLED_SYRINGE | INTRAVENOUS | Status: DC | PRN
  Administered 2017-03-30: 50 mg via INTRAVENOUS

## 2017-03-30 MED ORDER — 0.9 % SODIUM CHLORIDE (POUR BTL) OPTIME
TOPICAL | Status: DC | PRN
  Administered 2017-03-30: 1000 mL

## 2017-03-30 MED ORDER — ARFORMOTEROL TARTRATE 15 MCG/2ML IN NEBU
15.0000 ug | INHALATION_SOLUTION | Freq: Two times a day (BID) | RESPIRATORY_TRACT | Status: DC
  Administered 2017-03-30 – 2017-04-01 (×5): 15 ug via RESPIRATORY_TRACT
  Filled 2017-03-30 (×6): qty 2

## 2017-03-30 MED ORDER — BUPIVACAINE-EPINEPHRINE 0.25% -1:200000 IJ SOLN
INTRAMUSCULAR | Status: DC | PRN
  Administered 2017-03-30: 10 mL

## 2017-03-30 MED ORDER — SUCCINYLCHOLINE CHLORIDE 20 MG/ML IJ SOLN
INTRAMUSCULAR | Status: DC | PRN
  Administered 2017-03-30: 100 mg via INTRAVENOUS

## 2017-03-30 MED ORDER — FENTANYL CITRATE (PF) 250 MCG/5ML IJ SOLN
INTRAMUSCULAR | Status: AC
  Filled 2017-03-30: qty 5

## 2017-03-30 MED ORDER — ENOXAPARIN SODIUM 40 MG/0.4ML ~~LOC~~ SOLN
40.0000 mg | SUBCUTANEOUS | Status: DC
  Administered 2017-03-31 – 2017-04-03 (×4): 40 mg via SUBCUTANEOUS
  Filled 2017-03-30 (×4): qty 0.4

## 2017-03-30 MED ORDER — IOPAMIDOL (ISOVUE-300) INJECTION 61%
INTRAVENOUS | Status: AC
  Administered 2017-03-30: 100 mL
  Filled 2017-03-30: qty 100

## 2017-03-30 MED ORDER — CHLORHEXIDINE GLUCONATE CLOTH 2 % EX PADS: 6.0000 | MEDICATED_PAD | Freq: Once | CUTANEOUS | Status: DC

## 2017-03-30 MED ORDER — LEVOTHYROXINE SODIUM 200 MCG PO TABS
200.0000 ug | ORAL_TABLET | Freq: Every day | ORAL | Status: DC
  Administered 2017-03-31 – 2017-04-03 (×4): 200 ug via ORAL
  Filled 2017-03-30 (×2): qty 1
  Filled 2017-03-30 (×2): qty 2
  Filled 2017-03-30 (×3): qty 1
  Filled 2017-03-30 (×2): qty 2

## 2017-03-30 MED ORDER — NITROGLYCERIN 0.4 MG SL SUBL
0.4000 mg | SUBLINGUAL_TABLET | SUBLINGUAL | Status: DC | PRN
Start: 2017-03-30 — End: 2017-03-30

## 2017-03-30 MED ORDER — LACTATED RINGERS IV SOLN
Freq: Once | INTRAVENOUS | Status: AC
  Administered 2017-03-30: 13:00:00 via INTRAVENOUS

## 2017-03-30 MED ORDER — MORPHINE SULFATE (PF) 4 MG/ML IV SOLN
4.0000 mg | Freq: Once | INTRAVENOUS | Status: AC
  Administered 2017-03-30: 4 mg via INTRAVENOUS
  Filled 2017-03-30: qty 1

## 2017-03-30 MED ORDER — MORPHINE SULFATE (PF) 4 MG/ML IV SOLN
4.0000 mg | INTRAVENOUS | Status: DC | PRN
Start: 2017-03-30 — End: 2017-04-01
  Administered 2017-03-30 – 2017-04-01 (×8): 4 mg via INTRAVENOUS
  Filled 2017-03-30 (×8): qty 1

## 2017-03-30 MED ORDER — FENTANYL CITRATE (PF) 250 MCG/5ML IJ SOLN
INTRAMUSCULAR | Status: DC | PRN
  Administered 2017-03-30 (×3): 50 ug via INTRAVENOUS

## 2017-03-30 MED ORDER — PROPOFOL 10 MG/ML IV BOLUS
INTRAVENOUS | Status: AC
  Filled 2017-03-30: qty 20

## 2017-03-30 MED ORDER — PANTOPRAZOLE SODIUM 40 MG PO TBEC
40.0000 mg | DELAYED_RELEASE_TABLET | Freq: Once | ORAL | Status: AC
Start: 2017-03-30 — End: 2017-03-30
  Administered 2017-03-30: 40 mg via ORAL
  Filled 2017-03-30: qty 1

## 2017-03-30 MED ORDER — LACTATED RINGERS IV SOLN
INTRAVENOUS | Status: DC | PRN
  Administered 2017-03-30: 12:00:00 via INTRAVENOUS

## 2017-03-30 MED ORDER — ATORVASTATIN CALCIUM 10 MG PO TABS
10.0000 mg | ORAL_TABLET | Freq: Every day | ORAL | Status: DC
  Administered 2017-03-31 – 2017-04-03 (×4): 10 mg via ORAL
  Filled 2017-03-30 (×4): qty 1

## 2017-03-30 MED ORDER — HYDROMORPHONE HCL 1 MG/ML IJ SOLN
INTRAMUSCULAR | Status: AC
  Filled 2017-03-30: qty 0.5

## 2017-03-30 MED ORDER — EPHEDRINE SULFATE-NACL 50-0.9 MG/10ML-% IV SOSY
PREFILLED_SYRINGE | INTRAVENOUS | Status: DC | PRN
  Administered 2017-03-30: 10 mg via INTRAVENOUS

## 2017-03-30 MED ORDER — SODIUM CHLORIDE 0.9% FLUSH
3.0000 mL | Freq: Two times a day (BID) | INTRAVENOUS | Status: DC
  Administered 2017-03-30 – 2017-04-02 (×4): 3 mL via INTRAVENOUS

## 2017-03-30 MED ORDER — CEFAZOLIN SODIUM 1 G IJ SOLR
INTRAMUSCULAR | Status: AC
  Filled 2017-03-30: qty 20

## 2017-03-30 MED ORDER — BUPIVACAINE-EPINEPHRINE (PF) 0.25% -1:200000 IJ SOLN
INTRAMUSCULAR | Status: AC
  Filled 2017-03-30: qty 30

## 2017-03-30 MED ORDER — LIDOCAINE 2% (20 MG/ML) 5 ML SYRINGE
INTRAMUSCULAR | Status: DC | PRN
  Administered 2017-03-30: 60 mg via INTRAVENOUS

## 2017-03-30 MED ORDER — ACETAMINOPHEN 325 MG PO TABS
650.0000 mg | ORAL_TABLET | Freq: Four times a day (QID) | ORAL | Status: DC | PRN
  Administered 2017-04-02: 650 mg via ORAL
  Filled 2017-03-30: qty 2

## 2017-03-30 MED ORDER — GI COCKTAIL ~~LOC~~
30.0000 mL | Freq: Once | ORAL | Status: AC
  Administered 2017-03-30: 30 mL via ORAL
  Filled 2017-03-30: qty 30

## 2017-03-30 MED ORDER — ONDANSETRON HCL 4 MG/2ML IJ SOLN
INTRAMUSCULAR | Status: DC | PRN
  Administered 2017-03-30: 4 mg via INTRAVENOUS

## 2017-03-30 MED ORDER — ONDANSETRON HCL 4 MG/2ML IJ SOLN
4.0000 mg | Freq: Four times a day (QID) | INTRAMUSCULAR | Status: DC | PRN
  Administered 2017-03-30 – 2017-04-03 (×3): 4 mg via INTRAVENOUS
  Filled 2017-03-30 (×3): qty 2

## 2017-03-30 MED ORDER — SUGAMMADEX SODIUM 200 MG/2ML IV SOLN
INTRAVENOUS | Status: DC | PRN
  Administered 2017-03-30: 200 mg via INTRAVENOUS

## 2017-03-30 MED ORDER — SODIUM CHLORIDE 0.9 % IV SOLN
INTRAVENOUS | Status: DC
  Administered 2017-03-30: 19:00:00 via INTRAVENOUS
  Administered 2017-03-30: 100 mL/h via INTRAVENOUS

## 2017-03-30 MED ORDER — ONDANSETRON HCL 4 MG/2ML IJ SOLN: 4.0000 mg | Freq: Four times a day (QID) | INTRAMUSCULAR | Status: DC | PRN

## 2017-03-30 MED ORDER — SUGAMMADEX SODIUM 200 MG/2ML IV SOLN
INTRAVENOUS | Status: AC
  Filled 2017-03-30: qty 2

## 2017-03-30 MED ORDER — ALBUTEROL SULFATE (2.5 MG/3ML) 0.083% IN NEBU
2.5000 mg | INHALATION_SOLUTION | RESPIRATORY_TRACT | Status: DC | PRN
  Filled 2017-03-30: qty 3

## 2017-03-30 MED ORDER — DEXTROSE 5 % IV SOLN
2.0000 g | INTRAVENOUS | Status: DC
  Administered 2017-03-31 – 2017-04-03 (×4): 2 g via INTRAVENOUS
  Filled 2017-03-30 (×4): qty 2

## 2017-03-30 MED ORDER — DEXTROSE 5 % IV SOLN: 2.0000 g | INTRAVENOUS | Status: DC

## 2017-03-30 MED ORDER — PROPOFOL 10 MG/ML IV BOLUS
INTRAVENOUS | Status: DC | PRN
  Administered 2017-03-30: 140 mg via INTRAVENOUS

## 2017-03-30 MED ORDER — HYDROMORPHONE HCL 1 MG/ML IJ SOLN
0.2500 mg | INTRAMUSCULAR | Status: DC | PRN
  Administered 2017-03-30 (×2): 0.25 mg via INTRAVENOUS

## 2017-03-30 MED ORDER — EPHEDRINE 5 MG/ML INJ
INTRAVENOUS | Status: AC
  Filled 2017-03-30: qty 10

## 2017-03-30 MED ORDER — HEMOSTATIC AGENTS (NO CHARGE) OPTIME
TOPICAL | Status: DC | PRN
  Administered 2017-03-30: 1 via TOPICAL

## 2017-03-30 SURGICAL SUPPLY — 40 items
APPLIER CLIP 5 13 M/L LIGAMAX5 (MISCELLANEOUS) ×3
BLADE CLIPPER SURG (BLADE) IMPLANT
CANISTER SUCT 3000ML PPV (MISCELLANEOUS) ×3 IMPLANT
CHLORAPREP W/TINT 26ML (MISCELLANEOUS) ×3 IMPLANT
CLIP APPLIE 5 13 M/L LIGAMAX5 (MISCELLANEOUS) ×1 IMPLANT
CLOSURE WOUND 1/2 X4 (GAUZE/BANDAGES/DRESSINGS) ×1
COVER MAYO STAND STRL (DRAPES) IMPLANT
COVER SURGICAL LIGHT HANDLE (MISCELLANEOUS) ×3 IMPLANT
DERMABOND ADVANCED (GAUZE/BANDAGES/DRESSINGS) ×2
DERMABOND ADVANCED .7 DNX12 (GAUZE/BANDAGES/DRESSINGS) ×1 IMPLANT
DEVICE TROCAR PUNCTURE CLOSURE (ENDOMECHANICALS) ×3 IMPLANT
DRAPE C-ARM 42X72 X-RAY (DRAPES) IMPLANT
ELECT REM PT RETURN 9FT ADLT (ELECTROSURGICAL) ×3
ELECTRODE REM PT RTRN 9FT ADLT (ELECTROSURGICAL) ×1 IMPLANT
GLOVE BIO SURGEON STRL SZ7 (GLOVE) ×3 IMPLANT
GLOVE BIOGEL PI IND STRL 7.5 (GLOVE) ×1 IMPLANT
GLOVE BIOGEL PI INDICATOR 7.5 (GLOVE) ×2
GOWN STRL REUS W/ TWL LRG LVL3 (GOWN DISPOSABLE) ×3 IMPLANT
GOWN STRL REUS W/TWL LRG LVL3 (GOWN DISPOSABLE) ×6
HEMOSTAT SNOW SURGICEL 2X4 (HEMOSTASIS) ×3 IMPLANT
KIT BASIN OR (CUSTOM PROCEDURE TRAY) ×3 IMPLANT
KIT ROOM TURNOVER OR (KITS) ×3 IMPLANT
NS IRRIG 1000ML POUR BTL (IV SOLUTION) ×3 IMPLANT
PAD ARMBOARD 7.5X6 YLW CONV (MISCELLANEOUS) ×3 IMPLANT
POUCH RETRIEVAL ECOSAC 10 (ENDOMECHANICALS) ×1 IMPLANT
POUCH RETRIEVAL ECOSAC 10MM (ENDOMECHANICALS) ×2
SCISSORS LAP 5X35 DISP (ENDOMECHANICALS) ×3 IMPLANT
SET CHOLANGIOGRAPH 5 50 .035 (SET/KITS/TRAYS/PACK) IMPLANT
SET IRRIG TUBING LAPAROSCOPIC (IRRIGATION / IRRIGATOR) ×3 IMPLANT
SLEEVE ENDOPATH XCEL 5M (ENDOMECHANICALS) ×6 IMPLANT
SPECIMEN JAR SMALL (MISCELLANEOUS) ×3 IMPLANT
STRIP CLOSURE SKIN 1/2X4 (GAUZE/BANDAGES/DRESSINGS) ×2 IMPLANT
SUT MNCRL AB 4-0 PS2 18 (SUTURE) ×3 IMPLANT
SUT VICRYL 0 UR6 27IN ABS (SUTURE) ×3 IMPLANT
TOWEL OR 17X24 6PK STRL BLUE (TOWEL DISPOSABLE) ×3 IMPLANT
TOWEL OR 17X26 10 PK STRL BLUE (TOWEL DISPOSABLE) ×3 IMPLANT
TRAY LAPAROSCOPIC MC (CUSTOM PROCEDURE TRAY) ×3 IMPLANT
TROCAR XCEL BLUNT TIP 100MML (ENDOMECHANICALS) ×3 IMPLANT
TROCAR XCEL NON-BLD 5MMX100MML (ENDOMECHANICALS) ×3 IMPLANT
TUBING INSUFFLATION (TUBING) ×3 IMPLANT

## 2017-03-30 NOTE — Progress Notes (Signed)
FPTS Interim Progress Note  S: Went to patient's bedside with Dr. Vanetta Shawl to evaluate after he is back from prescribed a cholecystectomy. Surgery went well. Patient sitting up in bed watching TV eating Jell-O. Notes that he is in pain still but morphine has been helping. He is in good spirits and has no other complaints at this time.  O: BP 115/79 (BP Location: Right Arm)   Pulse 67   Temp 97.6 F (36.4 C) (Oral)   Resp 16   Ht 6' (1.829 m)   Wt 230 lb (104.3 kg)   SpO2 94%   BMI 31.19 kg/m   Gen: elderly gentleman sitting up in bed, in no acute distress  A/P: Status post laparoscopic cholecystectomy: Pain control: 4 mg morphine every 3 hours when necessary for moderate to severe pain and Tylenol 650 mg every 6 when necessary for mild pain. Clear liquid diet for now, ADAT. Will continue to monitor overnight.   Carlyle Dolly, MD 03/30/2017, 10:44 PM PGY-2, [facility] pager [phone]

## 2017-03-30 NOTE — Progress Notes (Signed)
Received pt from PACU, A&O x4. 4 lap sites to abd with steri strips dry and intact. Pt is tolerating clear liquid diet. Medicated for pain.

## 2017-03-30 NOTE — Transfer of Care (Signed)
Immediate Anesthesia Transfer of Care Note  Patient: Arthur [name].  Procedure(s) Performed: Procedure(s): LAPAROSCOPIC CHOLECYSTECTOMY (N/A)  Patient Location: PACU  Anesthesia Type:General  Level of Consciousness: awake and alert   Airway & Oxygen Therapy: Patient Spontanous Breathing  Post-op Assessment: Report given to RN, Post -op Vital signs reviewed and stable and Patient moving all extremities X 4  Post vital signs: Reviewed and stable  Last Vitals:  Vitals:   03/30/17 1145 03/30/17 1200  BP: 140/84 133/72  Pulse: (!) 52 (!) 49  Resp:  12  Temp:      Last Pain:  Vitals:   03/30/17 1017  TempSrc:   PainSc: 3          Complications: No apparent anesthesia complications

## 2017-03-30 NOTE — Anesthesia Postprocedure Evaluation (Signed)
Anesthesia Post Note  Patient: Arthur [name].  Procedure(s) Performed: Procedure(s) (LRB): LAPAROSCOPIC CHOLECYSTECTOMY (N/A)     Patient location during evaluation: PACU Anesthesia Type: General Level of consciousness: awake and alert Pain management: pain level controlled Vital Signs Assessment: post-procedure vital signs reviewed and stable Respiratory status: spontaneous breathing, nonlabored ventilation, respiratory function stable and patient connected to nasal cannula oxygen Cardiovascular status: blood pressure returned to baseline and stable Postop Assessment: no signs of nausea or vomiting Anesthetic complications: no    Last Vitals:  Vitals:   03/30/17 1631 03/30/17 1642  BP: (!) 144/75   Pulse: (!) 54 (!) 57  Resp: (!) 9 (!) 9  Temp:      Last Pain:  Vitals:   03/30/17 1610  TempSrc:   PainSc: 7                  Carnella Fryman,W. EDMOND

## 2017-03-30 NOTE — ED Notes (Signed)
Pt requesting more pain meds; MD informed.

## 2017-03-30 NOTE — ED Notes (Signed)
Patient transported to Ultrasound 

## 2017-03-30 NOTE — ED Notes (Signed)
[place] called and will come get patient.

## 2017-03-30 NOTE — Anesthesia Preprocedure Evaluation (Addendum)
Anesthesia Evaluation  Patient identified by MRN, date of birth, ID band Patient awake    Reviewed: Allergy & Precautions, H&P , NPO status , Patient's Chart, lab work & pertinent test results, reviewed documented beta blocker date and time   Airway Mallampati: II  TM Distance: >3 FB Neck ROM: Full    Dental no notable dental hx. (+) Upper Dentures, Lower Dentures, Dental Advisory Given   Pulmonary COPD,  COPD inhaler, former smoker,    Pulmonary exam normal breath sounds clear to auscultation       Cardiovascular hypertension, Pt. on medications and Pt. on home beta blockers +CHF  + dysrhythmias  Rhythm:Regular Rate:Normal     Neuro/Psych  Headaches, negative psych ROS   GI/Hepatic Neg liver ROS, GERD  Medicated and Controlled,  Endo/Other  Hypothyroidism   Renal/GU negative Renal ROS  negative genitourinary   Musculoskeletal  (+) Arthritis , Osteoarthritis,    Abdominal   Peds  Hematology negative hematology ROS (+)   Anesthesia Other Findings   Reproductive/Obstetrics negative OB ROS                             Anesthesia Physical Anesthesia Plan  ASA: III  Anesthesia Plan: General   Post-op Pain Management:    Induction: Intravenous, Rapid sequence and Cricoid pressure planned  PONV Risk Score and Plan: 3 and Ondansetron, Dexamethasone and Midazolam  Airway Management Planned: Oral ETT  Additional Equipment:   Intra-op Plan:   Post-operative Plan: Extubation in OR  Informed Consent: I have reviewed the patients History and Physical, chart, labs and discussed the procedure including the risks, benefits and alternatives for the proposed anesthesia with the patient or authorized representative who has indicated his/her understanding and acceptance.   Dental advisory given  Plan Discussed with: CRNA and Anesthesiologist  Anesthesia Plan Comments:        Anesthesia  Quick Evaluation

## 2017-03-30 NOTE — ED Notes (Signed)
Pt has hx of prostate cancer and uses briefs. Fresh one given and he changes himself.

## 2017-03-30 NOTE — Op Note (Signed)
Preoperative diagnosis:acute cholecystitis Postoperative diagnosis: saa Procedure: Laparoscopic cholecystectomy Surgeon: Dr. Serita Grammes Asst:Brooke Sabra Heck PA-C Anesthesia: Gen. Specimens: gb to pathology Estimated blood loss: 20 cc Complications: None Drains: none Sponge count was correct at completion Disposition to recovery stable  Indications: This is a 78 yom with cholecystitis.   I discussed proceeding to the or for lap chole.   Procedure: After informed consent was obtained the patient was taken to the operating room. He was given antibiotics. SCDs were in place. He was placed undergeneral anesthesia without complication. His abdomen was prepped and draped in the standard sterile surgical fashion. A surgical timeout was then performed.  I then infiltrated Marcaine below the umbilicus. I made a vertical incision. I identified the fascia incised sharply. I entered into the peritoneum bluntly. There is no evidence of an entry injury. I then placed a 0 Vicryl pursestring suture. I then inserted a Hassan trocar and insufflated to 15 mmHg pressure. I then placed a 5 mm trocar in the epigastrium.Two5 mm trocars were placed in the right side of the abdomen. I grasped the gallbladder and retracted cephalad.The gallbladder was noted to have acute cholecystitis and was very inflamed. His liver was adherent to his omentum and I had to use cautery to release this just to identify the gallbladder.I was able to identify the critical view of safety. I identified the cystic artery. I divided this with two clips remaining in place. I proceeded to address the cystic duct. I placed three clips and divided this. The cystic duct was viable and the clips completely traversed the duct. Two clips were left in place. I then removed the gallbladder from the liver bed I placed it in a bag and removed from the umbilicus. I obtained hemostasis. I placed apiece of Surgicel in the liver bed overlying  this.  I then removed the [name] trocar and tied the pursestring down. I did place two additional 2-0 Vicryl sutures at this area using the endoclose device. I then removed the remaining trocars and these were closed with 4-0 Monocryl and glue. He tolerated this well be transferred recovery room.
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
## 2017-03-30 NOTE — Discharge Instructions (Signed)
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
## 2017-03-30 NOTE — ED Provider Notes (Signed)
10:27 AM d/w family medicine (next up for unassigned) for medical admission as per surgery consult note.  They will see patient in the ED and write orders.     Alfonzo Beers, MD 03/30/17 1027

## 2017-03-30 NOTE — Anesthesia Procedure Notes (Signed)
Procedure Name: Intubation Date/Time: 03/30/2017 2:05 PM Performed by: Julieta Bellini Pre-anesthesia Checklist: Patient identified, Emergency Drugs available, Suction available and Patient being monitored Patient Re-evaluated:Patient Re-evaluated prior to inductionOxygen Delivery Method: Circle system utilized Preoxygenation: Pre-oxygenation with 100% oxygen Intubation Type: IV induction, Rapid sequence and Cricoid Pressure applied Laryngoscope Size: Mac and 4 Grade View: Grade I Tube type: Oral Tube size: 7.5 mm Number of attempts: 1 Airway Equipment and Method: Stylet Placement Confirmation: ETT inserted through vocal cords under direct vision,  positive ETCO2 and breath sounds checked- equal and bilateral Secured at: 22 cm Tube secured with: Tape Dental Injury: Teeth and Oropharynx as per pre-operative assessment
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
## 2017-03-30 NOTE — ED Notes (Signed)
Pt reporting mild nausea and 2/10 pain; does not wants meds. Warm blankets given. Reports chills. Temp 97.5. No further needs.

## 2017-03-31 ENCOUNTER — Encounter (HOSPITAL_COMMUNITY): Payer: Self-pay | Admitting: General Surgery

## 2017-03-31 DIAGNOSIS — K8001 Calculus of gallbladder with acute cholecystitis with obstruction: Principal | ICD-10-CM

## 2017-03-31 LAB — HEMOGLOBIN A1C
HEMOGLOBIN A1C: 5.5 % (ref 4.8–5.6)
Mean Plasma Glucose: 111 mg/dL

## 2017-03-31 LAB — COMPREHENSIVE METABOLIC PANEL
ALBUMIN: 3.6 g/dL (ref 3.5–5.0)
ALT: 43 U/L (ref 17–63)
ANION GAP: 7 (ref 5–15)
AST: 59 U/L — AB (ref 15–41)
Alkaline Phosphatase: 88 U/L (ref 38–126)
BILIRUBIN TOTAL: 1 mg/dL (ref 0.3–1.2)
BUN: 18 mg/dL (ref 6–20)
CHLORIDE: 107 mmol/L (ref 101–111)
CO2: 24 mmol/L (ref 22–32)
Calcium: 8.9 mg/dL (ref 8.9–10.3)
Creatinine, Ser: 1.08 mg/dL (ref 0.61–1.24)
GFR calc Af Amer: >60 mL/min (ref >60)
GFR calc non Af Amer: >60 mL/min (ref >60)
GLUCOSE: 122 mg/dL — AB (ref 65–99)
Potassium: 4.5 mmol/L (ref 3.5–5.1)
SODIUM: 138 mmol/L (ref 135–145)
TOTAL PROTEIN: 6.2 g/dL — AB (ref 6.5–8.1)

## 2017-03-31 LAB — TROPONIN I

## 2017-03-31 LAB — CBC
HEMATOCRIT: 40 % (ref 39.0–52.0)
HEMOGLOBIN: 12.8 g/dL — AB (ref 13.0–17.0)
MCH: 29.1 pg (ref 26.0–34.0)
MCHC: 32 g/dL (ref 30.0–36.0)
MCV: 90.9 fL (ref 78.0–100.0)
Platelets: 176 10*3/uL (ref 150–400)
RBC: 4.4 MIL/uL (ref 4.22–5.81)
RDW: 13.2 % (ref 11.5–15.5)
WBC: 11.2 10*3/uL — AB (ref 4.0–10.5)

## 2017-03-31 MED ORDER — CARVEDILOL 6.25 MG PO TABS
6.2500 mg | ORAL_TABLET | Freq: Two times a day (BID) | ORAL | Status: DC
  Administered 2017-03-31 – 2017-04-03 (×8): 6.25 mg via ORAL
  Filled 2017-03-31 (×8): qty 1

## 2017-03-31 NOTE — Progress Notes (Signed)
Family Medicine Teaching Service Daily Progress Note Intern Pager: [phone]  Patient name: Arthur [name]. Medical record number: [id number] Date of birth: Dec 30, 1940 Age: 76 y.o. Gender: male  Primary Care Provider: Wendie Agreste, MD Consultants: gen surg Code Status: full  Pt Overview and Major Events to Date:  6/28- admitted to FPTS, lap chole performed  Assessment and Plan: Arthur [name]. is a 76 y.o. male presenting with epigastric pain, nausea, [name], COPD, hyperlipidemia, hypertension, left bundle branch block, hypothyroidism (after thyroid was removed for thyroid cancer,), history of prostate cancer, nonischemic cardiomyopathy   Epigastric Pain, Nausea, Vomiting: CT of the abdomen showing a thickened gallbladder wall with a 7 mm gallstone in the gallbladder neck as well as a 7 mm nonobstructing stone in the left kidney. Right upper quadrant ultrasound showing edematous irregular gallbladder wall with gallbladder sludge suspicious of acute cholecystitis - Vital signs per floor - gen surg following, appreciate recommendations - s/p lap chole, pain control per surgery - Zofran as needed for nausea - Morphine 4 mg every 3h prn pain, per surgery  COPD: Former tobacco abuser. Does not smoke cigarettes currently. History of Multiple lung nodules on Chest CT, cannot see this chest CT in Epic. Most recent chest x-ray today and not showing any abnormalities. On Tiotropium Bromide-Olodaterol at home. - albuterol as needed - brovana BID  HFrEF: Last echocardiogram showed ejection fraction of 45% and grade 1 diastolic dysfunction earlier this month on 03/13/2017. Last cardiac cath in 2017 of December showed no significant CAD. Follows with Dr. Johnsie [name].  - Continue home losartan 100 mg daily - resume coreg at reduced dose of 6.25 BID, monitor HR  Sinus Bradycardia and LBBB: History of left bundle branch block, unclear if he has a history of  sinus bradycardia. Troponin neg x 3. - monitor on tele - coreg as above  Hypertension: Uncontrolled. Has had fluctuating hypertension while in the ED, possibly due to pain. BP this morning 126/61 - monitor BP - continue home meds  Hyperlipidemia: Last lipid panel in 03/09/2017 showing cholesterol of 133, HDL 37, LDL 71  - continue home medication  Hypothyroidism: Last TSH within normal limits at 2.1 - Continue home levothyroxine 200 g daily  Hx of thyroid and prostate cancer: Never had chemo or radiation. Just surgery per his report. Does have some urinary incontinence secondary to prostate cancer removal. Wears adult diapers  FEN/GI: clears, ADAT Prophylaxis: SCDs  Disposition: pending clinical improvement  Subjective:  Doing well, has not thrown up since last night. No nausea this morning, about to attempt to eat breakfast (clears). Pain well controlled. Has not passed flatus this morning.  Objective: Temp:  [97.4 F (36.3 C)-98.3 F (36.8 C)] 98.1 F (36.7 C) (06/29 0618) Pulse Rate:  [49-88] 78 (06/29 0618) Resp:  [9-23] 18 (06/29 0618) BP: (115-180)/(61-101) 126/61 (06/29 0618) SpO2:  [91 %-100 %] 95 % (06/29 0618) Physical Exam: General: pleasant elderly man sitting up in bed in NAD Cardiovascular: RRR, no murmurs rubs gallops  Respiratory: CTAB, no increased WOB Abdomen: distended, tender to palpation around incisions, hypoactive BS Extremities: no edema or cyanosis  Laboratory:  Recent Labs Lab 03/30/17 0055 03/30/17 1123 03/31/17 0138  WBC 12.8* 11.3* 11.2*  HGB 13.0 14.4 12.8*  HCT 40.8 44.9 40.0  PLT 200 213 176    Recent Labs Lab 03/30/17 0055 03/30/17 1123 03/31/17 0138  NA 141  --  138  K 4.5  --  4.5  CL 107  --  107  CO2 27  --  24  BUN 21*  --  18  CREATININE 1.24 1.06 1.08  CALCIUM 9.5  --  8.9  PROT 6.7  --  6.2*  BILITOT 0.7  --  1.0  ALKPHOS 83  --  88  ALT 20  --  43  AST 19  --  59*  GLUCOSE 129*  --  122*   A1C  5.5 Trop < 0.03 x 3  Imaging/Diagnostic Tests: US Abdomen Limited Ruq  Result Date: 03/30/2017 CLINICAL DATA:  Follow-up from today's CT the abdomen pelvis EXAM: ULTRASOUND ABDOMEN LIMITED RIGHT UPPER QUADRANT COMPARISON:  CT abdomen pelvis of 03/30/2017 FINDINGS: Gallbladder: The gallbladder is visualized in the gallbladder wall is noted to be thickened there is some gallbladder sludge present but the gallstones noted by CT are not well seen by ultrasound. There is no current pain over the gallbladder with compression. These findings are suspicious for developing acute cholecystitis. Common bile duct: Diameter: Common bile duct is normal measuring 5.3 mm. Liver: The liver is echogenic suggesting fatty infiltration. No focal hepatic abnormality is seen. Incidental right renal cysts are noted, the largest of 6.5 cm. IMPRESSION: 1. Edematous irregular gallbladder wall with some gallbladder sludge. Gallstones noted by CT are not visualized by ultrasound, but these findings are very suspicious for developing acute cholecystitis. Correlate clinically. 2. Echogenic liver parenchyma consistent with diffuse fatty infiltration. Electronically Signed   By: Arthur [name] M.D.   On: 03/30/2017 09:29     Arthur Rattler, DO 03/31/2017, 8:51 AM PGY-1, [place] Intern pager: [phone], text pages welcome

## 2017-03-31 NOTE — Progress Notes (Signed)
Patient ID: Arthur Quince., male   DOB: April 29, 1941, 76 y.o.   MRN: [id number]  [facility] Surgery Progress Note  1 Day Post-Op  Subjective: CC- cholecystitis, abdominal pain Patient states that he feels bloated today. Tolerating clears. Denies n/v. No flatus or BM. Pain well controlled. About to get up and walk.  Objective: Vital signs in last 24 hours: Temp:  [97.4 F (36.3 C)-98.3 F (36.8 C)] 98.1 F (36.7 C) (06/29 0618) Pulse Rate:  [49-88] 78 (06/29 0618) Resp:  [9-23] 18 (06/29 0618) BP: (115-180)/(61-101) 126/61 (06/29 0618) SpO2:  [91 %-100 %] 95 % (06/29 0618)    Intake/Output from previous day: 06/28 0701 - 06/29 0700 In: 1970 [P.O.:120; I.V.:1700] Out: 1045 [Urine:825; Emesis/NG output:200; Blood:20] Intake/Output this shift: No intake/output data recorded.  PE: Gen:  Alert, NAD, pleasant HEENT: EOM's intact, pupils equal  Pulm:  CTAB, no W/R/R, effort normal Abd: Soft, distended, appropriately tender, +BS, incisions C/D/I with steris intact Ext:  No erythema, edema, or tenderness BUE/BLE  Psych: A&Ox3  Skin: no rashes noted, warm and dry  Lab Results:   Recent Labs  03/30/17 1123 03/31/17 0138  WBC 11.3* 11.2*  HGB 14.4 12.8*  HCT 44.9 40.0  PLT 213 176   BMET  Recent Labs  03/30/17 0055 03/30/17 1123 03/31/17 0138  NA 141  --  138  K 4.5  --  4.5  CL 107  --  107  CO2 27  --  24  GLUCOSE 129*  --  122*  BUN 21*  --  18  CREATININE 1.24 1.06 1.08  CALCIUM 9.5  --  8.9   PT/INR No results for input(s): LABPROT, INR in the last 72 hours. CMP     Component Value Date/Time   NA 138 03/31/2017 0138   NA 145 (H) 03/09/2017 1501   K 4.5 03/31/2017 0138   CL 107 03/31/2017 0138   CO2 24 03/31/2017 0138   GLUCOSE 122 (H) 03/31/2017 0138   BUN 18 03/31/2017 0138   BUN 13 03/09/2017 1501   CREATININE 1.08 03/31/2017 0138   CREATININE 0.98 08/24/2016 1028   CALCIUM 8.9 03/31/2017 0138   PROT 6.2 (L) 03/31/2017 0138   PROT 6.7  11/08/2016 1658   ALBUMIN 3.6 03/31/2017 0138   ALBUMIN 4.3 11/08/2016 1658   AST 59 (H) 03/31/2017 0138   ALT 43 03/31/2017 0138   ALKPHOS 88 03/31/2017 0138   BILITOT 1.0 03/31/2017 0138   BILITOT 0.5 11/08/2016 1658   GFRNONAA >60 03/31/2017 0138   GFRNONAA 73 04/14/2016 1454   GFRAA >60 03/31/2017 0138   GFRAA 85 04/14/2016 1454   Lipase     Component Value Date/Time   LIPASE 33 03/30/2017 0055       Studies/Results: Ct Abdomen Pelvis W Contrast  Result Date: 03/30/2017 CLINICAL DATA:  76 y/o  M; epigastric pain. EXAM: CT ABDOMEN AND PELVIS WITH CONTRAST TECHNIQUE: Multidetector CT imaging of the abdomen and pelvis was performed using the standard protocol following bolus administration of intravenous contrast. CONTRAST:  131mL ISOVUE-300 IOPAMIDOL (ISOVUE-300) INJECTION 61% COMPARISON:  09/29/2014 CT of abdomen and pelvis. FINDINGS: Lower chest: No acute abnormality. Hepatobiliary: No focal liver lesion. Mild nonspecific gallbladder wall thickening. 7 mm gallstone within the gallbladder neck. No intra or extrahepatic biliary ductal dilatation. Pancreas: Unremarkable. No pancreatic ductal dilatation or surrounding inflammatory changes. Spleen: Normal in size without focal abnormality. Adrenals/Urinary Tract: Normal adrenal glands. Multiple bilateral renal cysts the largest in the right upper pole  measuring 4.8 cm are stable. No other focal kidney lesion identified. Left kidney lower pole 7 mm stone. No hydronephrosis or obstructive uropathy. Normal bladder. Stomach/Bowel: Stomach is within normal limits. Appendix appears normal. No evidence of bowel wall thickening, distention, or inflammatory changes. Vascular/Lymphatic: Aortic atherosclerosis. No enlarged abdominal or pelvic lymph nodes. Reproductive: Prostatectomy. Other: No abdominal wall hernia or abnormality. No abdominopelvic ascites. Musculoskeletal: Moderate lumbar degenerative changes with multilevel disc space narrowing and  lower lumbar facet arthrosis. No acute osseous abnormality identified. IMPRESSION: 1. Gallbladder wall thickening and 7 mm gallstone within the gallbladder neck. Findings may represent acute cholecystitis in the appropriate clinical setting. 2. Left kidney lower pole 7 mm nonobstructing stone. 3. Aortic calcific atherosclerosis. 4. Otherwise stable chronic findings as above. Electronically Signed   By: Kristine Garbe M.D.   On: 03/30/2017 05:17   Dg Chest Port 1 View  Result Date: 03/30/2017 CLINICAL DATA:  Epigastric and chest pain EXAM: PORTABLE CHEST 1 VIEW COMPARISON:  01/03/2017 FINDINGS: Partially visualized cervical spine hardware. Non inclusion of the CP angles. Mild cardiomegaly. No consolidation. Aortic atherosclerosis. No pneumothorax. IMPRESSION: Mild cardiomegaly.  No edema or infiltrate. Electronically Signed   By: Donavan Foil M.D.   On: 03/30/2017 01:02   US Abdomen Limited Ruq  Result Date: 03/30/2017 CLINICAL DATA:  Follow-up from today's CT the abdomen pelvis EXAM: ULTRASOUND ABDOMEN LIMITED RIGHT UPPER QUADRANT COMPARISON:  CT abdomen pelvis of 03/30/2017 FINDINGS: Gallbladder: The gallbladder is visualized in the gallbladder wall is noted to be thickened there is some gallbladder sludge present but the gallstones noted by CT are not well seen by ultrasound. There is no current pain over the gallbladder with compression. These findings are suspicious for developing acute cholecystitis. Common bile duct: Diameter: Common bile duct is normal measuring 5.3 mm. Liver: The liver is echogenic suggesting fatty infiltration. No focal hepatic abnormality is seen. Incidental right renal cysts are noted, the largest of 6.5 cm. IMPRESSION: 1. Edematous irregular gallbladder wall with some gallbladder sludge. Gallstones noted by CT are not visualized by ultrasound, but these findings are very suspicious for developing acute cholecystitis. Correlate clinically. 2. Echogenic liver parenchyma  consistent with diffuse fatty infiltration. Electronically Signed   By: Ivar Drape M.D.   On: 03/30/2017 09:29    Anti-infectives: Anti-infectives    Start     Dose/Rate Route Frequency Ordered Stop   03/31/17 1830  cefTRIAXone (ROCEPHIN) 2 g in dextrose 5 % 50 mL IVPB     2 g 100 mL/hr over 30 Minutes Intravenous Every 24 hours 03/30/17 1742     03/30/17 1200  cefTRIAXone (ROCEPHIN) 2 g in dextrose 5 % 50 mL IVPB  Status:  Discontinued     2 g 100 mL/hr over 30 Minutes Intravenous Every 24 hours 03/30/17 1113 03/30/17 1725       Assessment/Plan CHF - Last echocardiogram 03/13/17 revealed an EF of 45% COPD HTN H/o prostate cancer s/p prostatectomy H/o thyroid cancer s/p thyroidectomy Former smoker - quit 1 year ago  Acute cholecystitis S/p lap chole 6/28 Dr. Donne Hazel - POD 1 - LFTs WNL - WBC 11.2, afebrile - no flatus or BM yet  ID - rocephin 6/28>>day#2. Will need 5 total days of antibiotics FEN - IVF, CLD advance as tolerated VTE - SCDs, lovenox  Plan - continue clears until return in bowel function; if he starts to pass more flatus can advance to fulls and as tolerated. Please ambulate with patient today. Not yet ready for discharge.  LOS: 1 day    Jerrye Beavers , [facility] Surgery 03/31/2017, 9:16 AM Pager: [phone] Consults: [phone] Mon-Fri 7:00 am-4:30 pm Sat-Sun 7:00 am-11:30 am

## 2017-03-31 NOTE — Discharge Summary (Signed)
[facility] Discharge Summary  Patient name: Arthur [name]. Medical record number: [id number] Date of birth: Sep 05, 1941 Age: 76 y.o. Gender: male Date of Admission: 03/30/2017  Date of Discharge: 04/03/17 Admitting Physician: [name], MD  Primary Care Provider: Wendie Agreste, MD Consultants: gen surg  Indication for Hospitalization: cholecystitis   Discharge Diagnoses/Problem List:  Patient Active Problem List   Diagnosis Date Noted  . Calculus of gallbladder with acute cholecystitis and obstruction   . Epigastric pain 03/30/2017  . COPD exacerbation ([place]) 01/12/2017  . Chest pain   . Centrilobular emphysema ([place]) 10/13/2014  . Left ureteral stone 09/24/2014  . Renal calculus, left 09/24/2014  . Ureteral stricture, left 09/24/2014  . Reflux esophagitis   . Osteoarthritis of left knee 08/03/2012  . Nicotine addiction 05/10/2012  . Personal history of colonic polyps 01/26/2012  . GERD (gastroesophageal reflux disease)   . Migraines   . Cancer ([place])   . ED (erectile dysfunction)   . HYPOTHYROIDISM 12/30/2008  . HYPERCHOLESTEROLEMIA 12/30/2008  . Essential hypertension 12/30/2008  . Non-ischemic cardiomyopathy ([place]) 12/30/2008  . LBBB (left bundle branch block) 12/30/2008  . Multiple lung nodules on CT 12/30/2008  . ARTHRITIS 12/30/2008   Disposition: home  Discharge Condition: stable, improved   Discharge Exam: see progress note from day of discharge   Brief Hospital Course:   Arthur Lovejoy. is a 76 year old male with PMH of CHF, COPD, hyperlipidemia, hypertension, left bundle branch block, hypothyroidism (after thyroid was removed for thyroid cancer,), history of prostate cancer, and nonischemic cardiomyopathy who presented to [facility] ED with nausea, vomiting, and epigastric abdominal pain who was found to have acute cholecystitis. He was admitted to [facility] for management. General surgery was consulted and preformed a  lap chole on 6/28. Patient had improvement in nausea, vomiting. He was monitored post-op for return of bowel function. His diet was advanced. On day of discharge patient was tolerating PO and had passed flatus. Creatinine improved to 1.18 from 1.54 on 04/02/17. He was discharged with oral pain medication and a bowel regimen and will follow up with surgery soon after discharge.    Issues for Follow Up:  1. Reduced dose of coreg from 12.5mg  BID to 6.25 mg BID, consider titrating back up if HR and BP allows  Significant Procedures: lap chole 6/28  Significant Labs and Imaging:   Recent Labs Lab 03/31/17 0138 04/01/17 0438 04/02/17 1101  WBC 11.2* 12.3* 11.0*  HGB 12.8* 13.0 12.4*  HCT 40.0 40.4 38.3*  PLT 176 192 182    Recent Labs Lab 03/30/17 0055 03/30/17 1123 03/31/17 0138 04/02/17 1101 04/03/17 1201  NA 141  --  138 137 137  K 4.5  --  4.5 3.8 4.7  CL 107  --  107 101 103  CO2 27  --  24 28 23   GLUCOSE 129*  --  122* 128* 99  BUN 21*  --  18 18 18   CREATININE 1.24 1.06 1.08 1.54* 1.18  CALCIUM 9.5  --  8.9 8.9 9.0  ALKPHOS 83  --  88 78  --   AST 19  --  59* 34  --   ALT 20  --  43 33  --   ALBUMIN 3.8  --  3.6 3.1*  --    DG Abd Portable 2V Result Date: 04/02/17 IMPRESSION: Moderate stool burden.  No acute findings. Rolm Baptise M.D. On: 04/02/2017 11:55  Ct Abdomen Pelvis W  Contrast Result Date: 03/30/2017  IMPRESSION: 1. Gallbladder wall thickening and 7 mm gallstone within the gallbladder neck. Findings may represent acute cholecystitis in the appropriate clinical setting. 2. Left kidney lower pole 7 mm nonobstructing stone. 3. Aortic calcific atherosclerosis. 4. Otherwise stable chronic findings as above. Electronically Signed   By: Kristine Garbe M.D.   On: 03/30/2017 05:17   Dg Chest Port 1 View Result Date: 03/30/2017  IMPRESSION: Mild cardiomegaly.  No edema or infiltrate. Electronically Signed   By: Donavan Foil M.D.   On: 03/30/2017 01:02   US  Abdomen Limited Ruq Result Date: 03/30/2017  IMPRESSION: 1. Edematous irregular gallbladder wall with some gallbladder sludge. Gallstones noted by CT are not visualized by ultrasound, but these findings are very suspicious for developing acute cholecystitis. Correlate clinically. 2. Echogenic liver parenchyma consistent with diffuse fatty infiltration. Electronically Signed   By: Ivar Drape M.D.   On: 03/30/2017 09:29   Results/Tests Pending at Time of Discharge: none  Discharge Medications:  Allergies as of 04/03/2017      Reactions   Doxycycline Other (See Comments)   Esophagitis, severe gi bleed      Medication List    TAKE these medications   atorvastatin 10 MG tablet Commonly known as:  LIPITOR Take 1 tablet (10 mg total) by mouth daily at 6 PM.   carvedilol 6.25 MG tablet Commonly known as:  COREG Take 1 tablet (6.25 mg total) by mouth 2 (two) times daily with a meal. What changed:  medication strength  how much to take  when to take this   levothyroxine 200 MCG tablet Commonly known as:  SYNTHROID, LEVOTHROID take 1 tablet by mouth once daily .   losartan 100 MG tablet Commonly known as:  COZAAR Take 1 tablet (100 mg total) by mouth daily.   senna-docusate 8.6-50 MG tablet Commonly known as:  Senokot-S Take 1 tablet by mouth 2 (two) times daily.   Tiotropium Bromide-Olodaterol 2.5-2.5 MCG/ACT Aers Commonly known as:  [name] RESPIMAT Inhale 2 puffs into the lungs daily. What changed:  Another medication with the same name was removed. Continue taking this medication, and follow the directions you see here.       Discharge Instructions: Please refer to Patient Instructions section of EMR for full details.  Patient was counseled important signs and symptoms that should prompt return to medical care, changes in medications, dietary instructions, activity restrictions, and follow up appointments.   Follow-Up Appointments: Follow-up [facility], [place]. Go on 04/13/2017.   Specialty:  General Surgery Why:  Your appointment is 04/13/17 at 2:30 pm. Please arrive 30 minutes prior to your appointment to check in and fill out necessary paperwork. Contact information: [address] [place] [phone]       Wendie Agreste, MD. Schedule an appointment as soon as possible for a visit.   Specialties:  Family Medicine, Sports Medicine Contact information: [place] [address] [phone]           Caroline More, DO, PGY-1 04/03/2017 3:07 [place]

## 2017-04-01 LAB — CBC
HCT: 40.4 % (ref 39.0–52.0)
Hemoglobin: 13 g/dL (ref 13.0–17.0)
MCH: 29.5 pg (ref 26.0–34.0)
MCHC: 32.2 g/dL (ref 30.0–36.0)
MCV: 91.6 fL (ref 78.0–100.0)
PLATELETS: 192 10*3/uL (ref 150–400)
RBC: 4.41 MIL/uL (ref 4.22–5.81)
RDW: 13.3 % (ref 11.5–15.5)
WBC: 12.3 10*3/uL — AB (ref 4.0–10.5)

## 2017-04-01 MED ORDER — HYDROMORPHONE HCL 1 MG/ML IJ SOLN
0.5000 mg | INTRAMUSCULAR | Status: DC | PRN
  Administered 2017-04-02 (×2): 0.5 mg via INTRAVENOUS
  Filled 2017-04-01 (×2): qty 0.5

## 2017-04-01 MED ORDER — SENNOSIDES-DOCUSATE SODIUM 8.6-50 MG PO TABS
1.0000 | ORAL_TABLET | Freq: Two times a day (BID) | ORAL | Status: DC
  Administered 2017-04-01 – 2017-04-03 (×5): 1 via ORAL
  Filled 2017-04-01 (×5): qty 1

## 2017-04-01 MED ORDER — HYDROCODONE-ACETAMINOPHEN 5-325 MG PO TABS
1.0000 | ORAL_TABLET | ORAL | Status: DC | PRN
  Administered 2017-04-01 – 2017-04-02 (×2): 1 via ORAL
  Filled 2017-04-01 (×2): qty 1

## 2017-04-01 MED ORDER — CARVEDILOL 6.25 MG PO TABS: 6.2500 mg | ORAL_TABLET | Freq: Two times a day (BID) | ORAL | 0 refills | Status: DC

## 2017-04-01 NOTE — Progress Notes (Signed)
2 Days Post-Op  Subjective:  Alert and stable.  Still has nausea intermittently but no vomiting.  Tolerating some food.  No stool or flatus. Potassium 4.5.  Glucose 122.  Creatinine 1.08.  WBC 12,300.  Hemoglobin 13.0.  All stable.  Objective: Vital signs in last 24 hours: Temp:  [98.3 F (36.8 C)-99.6 F (37.6 C)] 98.9 F (37.2 C) (06/30 1050) Pulse Rate:  [71-76] 71 (06/30 1050) Resp:  [16-19] 16 (06/30 1050) BP: (112-129)/(56-66) 112/66 (06/30 1050) SpO2:  [93 %-95 %] 94 % (06/30 1050) Last BM Date: 03/29/17  Intake/Output from previous day: 06/29 0701 - 06/30 0700 In: 340 [P.O.:340] Out: 400 [Urine:400] Intake/Output this shift: Total I/O In: -  Out: 150 [Urine:150]  General appearance: Alert.  No distress.  Pleasant and oriented Resp: clear to auscultation bilaterally GI: Soft.  Slightly distended.  Some bowel sounds present.  All wounds look clean. Extremities: no edema, redness or tenderness in the calves or thighs  Lab Results:  Results for orders placed or performed during the hospital encounter of 03/30/17 (from the past 24 hour(s))  CBC     Status: Abnormal   Collection Time: 04/01/17  4:38 AM  Result Value Ref Range   WBC 12.3 (H) 4.0 - 10.5 K/uL   RBC 4.41 4.22 - 5.81 MIL/uL   Hemoglobin 13.0 13.0 - 17.0 g/dL   HCT 40.4 39.0 - 52.0 %   MCV 91.6 78.0 - 100.0 fL   MCH 29.5 26.0 - 34.0 pg   MCHC 32.2 30.0 - 36.0 g/dL   RDW 13.3 11.5 - 15.5 %   Platelets 192 150 - 400 K/uL     Studies/Results: No results found.  [name] arformoterol  15 mcg Nebulization BID  . atorvastatin  10 mg Oral q1800  . carvedilol  6.25 mg Oral BID WC  . enoxaparin (LOVENOX) injection  40 mg Subcutaneous Q24H  . levothyroxine  200 mcg Oral QAC breakfast  . losartan  100 mg Oral Daily  . senna-docusate  1 tablet Oral BID  . sodium chloride flush  3 mL Intravenous Q12H     Assessment/Plan: s/p Procedure(s): LAPAROSCOPIC CHOLECYSTECTOMY   CHF - Last echocardiogram  6/11/18revealed an EF of 45% COPD HTN H/o prostate cancer s/p prostatectomy H/o thyroid cancer s/p thyroidectomy Former smoker - quit 1 year ago  Acute cholecystitis S/p lap chole 6/28 Dr. Donne Hazel - POD 2 - LFTs WNL - WBC 11.2, afebrile - no flatus or BM yet  ID - rocephin 6/28>>day#3. Will need 5 total days of antibiotics FEN - IVF, CLD advance as tolerated VTE - SCDs, lovenox  Plan -reduce narcotic dosage.  Switch to Dilaudid low-dose.  Hydrocodone low-dose.  Senokot tablets twice daily.  Await return of bowel function. Not yet ready for discharge.  @PROBHOSP @  LOS: 2 days    Tito Ausmus M 04/01/2017  . .prob

## 2017-04-01 NOTE — Progress Notes (Signed)
Family Medicine Teaching Service Daily Progress Note Intern Pager: [phone]  Patient name: Arthur [name]. Medical record number: [id number] Date of birth: [birth year] Age: 76 y.o. Gender: male  Primary Care Provider: Wendie Agreste, MD Consultants: gen surg Code Status: full  Pt Overview and Major Events to Date:  6/28- admitted to FPTS, lap chole performed  Assessment and Plan: Arthur [name]. is a 76 y.o. male presenting with epigastric pain, nausea, [name], COPD, hyperlipidemia, hypertension, left bundle branch block, hypothyroidism (after thyroid was removed for thyroid cancer,), history of prostate cancer, nonischemic cardiomyopathy   Acute cholecystitis s/p lap cholecystectomy- has not passed flatus since surgery. - Vital signs per floor - gen surg following, appreciate recommendations- await bowel function return - pain control per surgery - Zofran as needed for nausea - Morphine 4 mg every 3h prn pain, per surgery - ADAT  COPD- On Tiotropium Bromide-Olodaterol at home. - albuterol as needed - brovana BID  HFrEF- Last echocardiogram showed ejection fraction of 45% and grade 1 diastolic dysfunction earlier this month on 03/13/2017. Last cardiac cath in 2017 of December showed no significant CAD. Follows with [name].  - Continue home losartan 100 mg daily - resume coreg at reduced dose of 6.25 BID, monitor HR  Sinus Bradycardia and LBBB: History of left bundle branch block, unclear if he has a history of sinus bradycardia. Troponin neg x 3. - monitor on tele - coreg as above  Hypertension- BP this morning 129/64 - monitor BP - continue home meds  Hyperlipidemia: Last lipid panel in 03/09/2017 showing cholesterol of 133, HDL 37, LDL 71  - continue home medication  Hypothyroidism: Last TSH within normal limits at 2.1 - Continue home levothyroxine 200 g daily  Hx of thyroid and prostate cancer: Never had chemo or  radiation. Just surgery per his report. Does have some urinary incontinence secondary to prostate cancer removal. Wears adult diapers  FEN/GI: clears, ADAT Prophylaxis: SCDs  Disposition: pending clinical improvement  Subjective:  Doing ok, endorses intermittent nausea, no vomiting. Abdominal pain that comes and goes, mostly around port site under umbilicus. No CP, SOB. Has not passed gas or had BM.   Objective: Temp:  [98.3 F (36.8 C)-99.6 F (37.6 C)] 98.6 F (37 C) (06/30 0443) Pulse Rate:  [71-79] 76 (06/30 0443) Resp:  [17-20] 17 (06/30 0443) BP: (112-129)/(56-64) 129/64 (06/30 0443) SpO2:  [93 %-95 %] 94 % (06/30 0443) Physical Exam: General: pleasant elderly man sitting up in bed in NAD Cardiovascular: RRR, no murmurs rubs gallops  Respiratory: CTAB, no increased WOB Abdomen: distended, tender to palpation around incisions, hypoactive BS Extremities: no edema or cyanosis  Laboratory:  Recent Labs Lab 03/30/17 1123 03/31/17 0138 04/01/17 0438  WBC 11.3* 11.2* 12.3*  HGB 14.4 12.8* 13.0  HCT 44.9 40.0 40.4  PLT 213 176 192    Recent Labs Lab 03/30/17 0055 03/30/17 1123 03/31/17 0138  NA 141  --  138  K 4.5  --  4.5  CL 107  --  107  CO2 27  --  24  BUN 21*  --  18  CREATININE 1.24 1.06 1.08  CALCIUM 9.5  --  8.9  PROT 6.7  --  6.2*  BILITOT 0.7  --  1.0  ALKPHOS 83  --  88  ALT 20  --  43  AST 19  --  59*  GLUCOSE 129*  --  122*   A1C 5.5 Trop <  0.03 x 3  Imaging/Diagnostic Tests: No results found.   Steve Rattler, DO 04/01/2017, 7:10 AM PGY-1, [place] Intern pager: [phone], text pages welcome

## 2017-04-02 ENCOUNTER — Inpatient Hospital Stay (HOSPITAL_COMMUNITY): Payer: Medicare Other

## 2017-04-02 ENCOUNTER — Encounter (HOSPITAL_COMMUNITY): Payer: Self-pay

## 2017-04-02 LAB — CBC
HCT: 38.3 % — ABNORMAL LOW (ref 39.0–52.0)
Hemoglobin: 12.4 g/dL — ABNORMAL LOW (ref 13.0–17.0)
MCH: 29.3 pg (ref 26.0–34.0)
MCHC: 32.4 g/dL (ref 30.0–36.0)
MCV: 90.5 fL (ref 78.0–100.0)
PLATELETS: 182 10*3/uL (ref 150–400)
RBC: 4.23 MIL/uL (ref 4.22–5.81)
RDW: 13.1 % (ref 11.5–15.5)
WBC: 11 10*3/uL — AB (ref 4.0–10.5)

## 2017-04-02 LAB — COMPREHENSIVE METABOLIC PANEL
ALK PHOS: 78 U/L (ref 38–126)
ALT: 33 U/L (ref 17–63)
ANION GAP: 8 (ref 5–15)
AST: 34 U/L (ref 15–41)
Albumin: 3.1 g/dL — ABNORMAL LOW (ref 3.5–5.0)
BUN: 18 mg/dL (ref 6–20)
CALCIUM: 8.9 mg/dL (ref 8.9–10.3)
CO2: 28 mmol/L (ref 22–32)
CREATININE: 1.54 mg/dL — AB (ref 0.61–1.24)
Chloride: 101 mmol/L (ref 101–111)
GFR, EST AFRICAN AMERICAN: 50 mL/min — AB (ref >60)
GFR, EST NON AFRICAN AMERICAN: 43 mL/min — AB (ref >60)
Glucose, Bld: 128 mg/dL — ABNORMAL HIGH (ref 65–99)
Potassium: 3.8 mmol/L (ref 3.5–5.1)
SODIUM: 137 mmol/L (ref 135–145)
Total Bilirubin: 0.7 mg/dL (ref 0.3–1.2)
Total Protein: 6.4 g/dL — ABNORMAL LOW (ref 6.5–8.1)

## 2017-04-02 MED ORDER — POLYETHYLENE GLYCOL 3350 17 G PO PACK
17.0000 g | PACK | Freq: Every day | ORAL | Status: DC
  Administered 2017-04-02 – 2017-04-03 (×2): 17 g via ORAL
  Filled 2017-04-02 (×2): qty 1

## 2017-04-02 MED ORDER — BISACODYL 10 MG RE SUPP
10.0000 mg | Freq: Once | RECTAL | Status: AC
  Administered 2017-04-02: 10 mg via RECTAL
  Filled 2017-04-02: qty 1

## 2017-04-02 NOTE — Progress Notes (Signed)
3 Days Post-Op  Subjective: Alert and stable.  Afebrile.  Still has intermittent nausea.  Says he drinks as much fluids as he wants that not a lot.  Passing some flatus but still no stool no lab or x-rays yet today  Objective: Vital signs in last 24 hours: Temp:  [97.5 F (36.4 C)-98.9 F (37.2 C)] 98.3 F (36.8 C) (07/01 0458) Pulse Rate:  [63-78] 63 (07/01 0458) Resp:  [16] 16 (07/01 0458) BP: (106-139)/(64-92) 139/65 (07/01 0458) SpO2:  [91 %-99 %] 94 % (07/01 0458) Last BM Date: 03/29/17  Intake/Output from previous day: 06/30 0701 - 07/01 0700 In: 702 [P.O.:702] Out: 900 [Urine:900] Intake/Output this shift: Total I/O In: -  Out: 300 [Urine:300]   EXAM: General appearance: Alert.  No distress.  Pleasant and oriented Resp: clear to auscultation bilaterally GI: Soft.  distended.   mild diffuse tenderness, more in right upper quadrant, no peritoneal signs.  Some bowel sounds present.  All wounds look clean. ExtremitieGeneral appearance: Alert.  No distress.  Pleasant and oriented Resp: clear to auscultation bilaterally GI: Soft.  Slightly distended.  Some bowel sounds present.  All wounds look clean. Extremities: no edema, redness or tenderness in the calves or thighss: no edema, redness or tenderness in the calves or thighs   Lab Results:  No results found for this or any previous visit (from the past 24 hour(s)).   Studies/Results: No results found.  [name] atorvastatin  10 mg Oral q1800  . carvedilol  6.25 mg Oral BID WC  . enoxaparin (LOVENOX) injection  40 mg Subcutaneous Q24H  . levothyroxine  200 mcg Oral QAC breakfast  . losartan  100 mg Oral Daily  . polyethylene glycol  17 g Oral Daily  . senna-docusate  1 tablet Oral BID  . sodium chloride flush  3 mL Intravenous Q12H     Assessment/Plan: s/p Procedure(s): LAPAROSCOPIC CHOLECYSTECTOMY   CHF - Last echocardiogram 6/11/18revealed an EF of 45% COPD HTN H/o prostate cancer s/p prostatectomy H/o  thyroid cancer s/p thyroidectomy Former smoker - quit 1 year ago  Acute cholecystitis S/p lap chole 6/28 Dr. Donne Hazel - POD 3 -Prolonged ileus.  Probably secondary to gangrenous cholecystitis. -No obvious sign of complication -We'll get lab work and abdominal x-rays today to screen for complications -We'll give MiraLAX twice a day -Home when bowel function returned  ID - rocephin 6/28>>day#3. Will need 5 total days of antibiotics FEN - IVF, CLD advance as tolerated VTE - SCDs, lovenox  Plan -reduce narcotic dosage.  Switch to Dilaudid low-dose.  Hydrocodone low-dose.  Miralax. Lab and xrays. Not yet ready for discharge.  Hopefully he will make adequate progress and be discharged tomorrow.  @PROBHOSP @  LOS: 3 days    Arthur [name] M 04/02/2017  . .prob

## 2017-04-02 NOTE — Progress Notes (Signed)
Family Medicine Teaching Service Daily Progress Note Intern Pager: [phone]  Patient name: Arthur [name]. Medical record number: [id number] Date of birth: 1941/01/06 Age: 76 y.o. Gender: male  Primary Care Provider: Wendie Agreste, MD Consultants: gen surg Code Status: full  Pt Overview and Major Events to Date:  6/28- admitted to FPTS, lap chole performed  Assessment and Plan: Arthur [name]. is a 76 y.o. male presenting with epigastric pain, nausea, [name], COPD, hyperlipidemia, hypertension, left bundle branch block, hypothyroidism (after thyroid was removed for thyroid cancer,), history of prostate cancer, nonischemic cardiomyopathy   Acute cholecystitis s/p lap cholecystectomy- passed flatus this am, no BM. Pain controlled. - Vital signs per floor - gen surg following, appreciate recommendations- await bowel function return - pain control, bowel regimen per surgery - Zofran as needed for nausea - ADAT  COPD- On Tiotropium Bromide-Olodaterol at home. - albuterol as needed - brovana BID  HFrEF- Last echocardiogram showed ejection fraction of 45% and grade 1 diastolic dysfunction earlier this month on 03/13/2017. Last cardiac cath in 2017 of December showed no significant CAD. Follows with Dr. Johnsie [name].  - Continue home losartan 100 mg daily - resume coreg at reduced dose of 6.25 BID, monitor HR  Sinus Bradycardia and LBBB: History of left bundle branch block, unclear if he has a history of sinus bradycardia. Troponin neg x 3. - monitor on tele - coreg as above  Hypertension- BP this morning 139/65 - monitor BP - continue home meds  Hyperlipidemia: Last lipid panel in 03/09/2017 showing cholesterol of 133, HDL 37, LDL 71  - continue home medication  Hypothyroidism: Last TSH within normal limits at 2.1 - Continue home levothyroxine 200 g daily  Hx of thyroid and prostate cancer: Never had chemo or radiation. Just surgery  per his report. Does have some urinary incontinence secondary to prostate cancer removal. Wears adult diapers  FEN/GI: heart healthy diet Prophylaxis: SCDs  Disposition: pending surgical clearance  Subjective:  Doing well, asking for more than clears, pain controlled. Passed gas this morning. No BM yet.   Objective: Temp:  [97.5 F (36.4 C)-98.9 F (37.2 C)] 98.3 F (36.8 C) (07/01 0458) Pulse Rate:  [63-78] 63 (07/01 0458) Resp:  [16] 16 (07/01 0458) BP: (106-139)/(64-92) 139/65 (07/01 0458) SpO2:  [91 %-99 %] 94 % (07/01 0458) Physical Exam: General: pleasant elderly man sitting up in chair in NAD Cardiovascular: RRR, no murmurs rubs gallops  Respiratory: CTAB, no increased WOB Abdomen: distended, tender to palpation around incisions, +BS Extremities: no edema or cyanosis  Laboratory:  Recent Labs Lab 03/30/17 1123 03/31/17 0138 04/01/17 0438  WBC 11.3* 11.2* 12.3*  HGB 14.4 12.8* 13.0  HCT 44.9 40.0 40.4  PLT 213 176 192    Recent Labs Lab 03/30/17 0055 03/30/17 1123 03/31/17 0138  NA 141  --  138  K 4.5  --  4.5  CL 107  --  107  CO2 27  --  24  BUN 21*  --  18  CREATININE 1.24 1.06 1.08  CALCIUM 9.5  --  8.9  PROT 6.7  --  6.2*  BILITOT 0.7  --  1.0  ALKPHOS 83  --  88  ALT 20  --  43  AST 19  --  59*  GLUCOSE 129*  --  122*   A1C 5.5 Trop < 0.03 x 3  Imaging/Diagnostic Tests: No results found.   Arthur Rattler, DO 04/02/2017, 8:59 AM  PGY-1, Arthur [name] Intern pager: [phone], text pages welcome

## 2017-04-03 LAB — BASIC METABOLIC PANEL
Anion gap: 11 (ref 5–15)
BUN: 18 mg/dL (ref 6–20)
CALCIUM: 9 mg/dL (ref 8.9–10.3)
CO2: 23 mmol/L (ref 22–32)
CREATININE: 1.18 mg/dL (ref 0.61–1.24)
Chloride: 103 mmol/L (ref 101–111)
GFR calc Af Amer: >60 mL/min (ref >60)
GFR calc non Af Amer: 59 mL/min — ABNORMAL LOW (ref >60)
GLUCOSE: 99 mg/dL (ref 65–99)
Potassium: 4.7 mmol/L (ref 3.5–5.1)
Sodium: 137 mmol/L (ref 135–145)

## 2017-04-03 MED ORDER — MAGNESIUM CITRATE PO SOLN
1.0000 | Freq: Once | ORAL | Status: AC
  Administered 2017-04-03: 1 via ORAL
  Filled 2017-04-03: qty 296

## 2017-04-03 MED ORDER — SENNOSIDES-DOCUSATE SODIUM 8.6-50 MG PO TABS: 1.0000 | ORAL_TABLET | Freq: Two times a day (BID) | ORAL | 0 refills | Status: DC

## 2017-04-03 NOTE — Care Management Important Message (Signed)
Important Message  Patient Details  Name: Arthur [name]. MRN: [id number] Date of Birth: 17-Jan-1941   Medicare Important Message Given:  Yes    Vern Prestia 04/03/2017, 3:19 PM

## 2017-04-03 NOTE — Consult Note (Signed)
   [facility] CM Inpatient Consult   04/03/2017  Arthur [name]. [birth year] [id number]     [place] Management referral received.   Went to bedside to speak with Arthur [name] to explain and offer [place] Management program.  Arthur [name] is agreeable and written consent obtained. [facility] Care Management packet and contact information provided.  Arthur [name] states he lives alone. His daughter in law and son lives next door to him. States they assist him with transportation to MD appointments if needed. Confirmed Primary Care MD is Dr. Nyoka Cowden.  States he could benefit from additional education and follow up for COPD. He also has history of CHF, HTN, prostate cancer, thyroid cancer. He is s/p lap chole during this hospitalization.   [name] reports he needs assistance in affording his inhalers. He is not sure which one it is. States Dr.Avva's office has been trying to help him sort this out. Arthur [name] states "if you can help me to make my inhaler more affordable, that would be great". Discussed Probation officer would make referral for [facility] Pharmacist for follow up as well. He expresses appreciation of this.   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 He states he does not feel like he needs home visits. Discussed referral being made for [facility] Pharmacist and [facility]. He is agreeable to this.  Confirmed best contact number as [phone].  Will make inpatient RNCM aware that [facility] will follow post discharge.  Will make [place] Management Telephonic RNCM and [facility] Pharmacist referrals.   Marthenia Rolling, MSN-Ed, RN,BSN [facility] Liaison [phone]

## 2017-04-03 NOTE — Progress Notes (Signed)
[facility]/Trauma Progress Note  4 Days Post-Op   Subjective:  CC: abdominal pain  Mild pain at rest 1/10 and 9/10 with movement. Having flatus and tolerating diet. Denies abdominal bloating. No BM. Pt states he has not been drinking much fluids.   Objective: Vital signs in last 24 hours: Temp:  [98.4 F (36.9 C)-100 F (37.8 C)] 98.9 F (37.2 C) (07/02 0500) Pulse Rate:  [60-66] 65 (07/02 0500) Resp:  [18-19] 18 (07/02 0500) BP: (139-140)/(68-73) 140/68 (07/02 0500) SpO2:  [94 %-95 %] 95 % (07/02 0500) Last BM Date: 03/30/17  Intake/Output from previous day: 07/01 0701 - 07/02 0700 In: 10 [I.V.:10] Out: 1025 [Urine:1025] Intake/Output this shift: Total I/O In: 120 [P.O.:120] Out: -   PE: Gen:  Alert, NAD, pleasant, cooperative, well appearing Pulm:  CTA, no W/R/R, effort normal Abd: Soft, not distended, +BS, no hernia appreciated, incisions with glue and no surrounding erythema or drainage, moderate ecchymosis noted inferior to umbilicus   Skin: no rashes noted, warm and dry  Lab Results:   Recent Labs  04/01/17 0438 04/02/17 1101  WBC 12.3* 11.0*  HGB 13.0 12.4*  HCT 40.4 38.3*  PLT 192 182   BMET  Recent Labs  04/02/17 1101  NA 137  K 3.8  CL 101  CO2 28  GLUCOSE 128*  BUN 18  CREATININE 1.54*  CALCIUM 8.9   PT/INR No results for input(s): LABPROT, INR in the last 72 hours. CMP     Component Value Date/Time   NA 137 04/02/2017 1101   NA 145 (H) 03/09/2017 1501   K 3.8 04/02/2017 1101   CL 101 04/02/2017 1101   CO2 28 04/02/2017 1101   GLUCOSE 128 (H) 04/02/2017 1101   BUN 18 04/02/2017 1101   BUN 13 03/09/2017 1501   CREATININE 1.54 (H) 04/02/2017 1101   CREATININE 0.98 08/24/2016 1028   CALCIUM 8.9 04/02/2017 1101   PROT 6.4 (L) 04/02/2017 1101   PROT 6.7 11/08/2016 1658   ALBUMIN 3.1 (L) 04/02/2017 1101   ALBUMIN 4.3 11/08/2016 1658   AST 34 04/02/2017 1101   ALT 33 04/02/2017 1101   ALKPHOS 78 04/02/2017 1101   BILITOT 0.7 04/02/2017 1101   BILITOT 0.5 11/08/2016 1658   GFRNONAA 43 (L) 04/02/2017 1101   GFRNONAA 73 04/14/2016 1454   GFRAA 50 (L) 04/02/2017 1101   GFRAA 85 04/14/2016 1454   Lipase     Component Value Date/Time   LIPASE 33 03/30/2017 0055    Studies/Results: Dg Abd Portable 2v  Result Date: 04/02/2017 CLINICAL DATA:  Ileus EXAM: PORTABLE ABDOMEN - 2 VIEW COMPARISON:  03/30/2017 FINDINGS: Nonobstructive bowel gas pattern. Moderate stool throughout the colon. No free air organomegaly. Prior cholecystectomy. IMPRESSION: Moderate stool burden.  No acute findings. Electronically Signed   By: Rolm Baptise M.D.   On: 04/02/2017 11:55    Anti-infectives: Anti-infectives    Start     Dose/Rate Route Frequency Ordered Stop   03/31/17 1830  cefTRIAXone (ROCEPHIN) 2 g in dextrose 5 % 50 mL IVPB     2 g 100 mL/hr over 30 Minutes Intravenous Every 24 hours 03/30/17 1742     03/30/17 1200  cefTRIAXone (ROCEPHIN) 2 g in dextrose 5 % 50 mL IVPB  Status:  Discontinued     2 g 100 mL/hr over 30 Minutes Intravenous Every 24 hours 03/30/17 1113 03/30/17 1725       Assessment/Plan CHF - Last echocardiogram 6/11/18revealed an EF of 45% COPD HTN H/o  prostate cancer s/p prostatectomy H/o thyroid cancer s/p thyroidectomy Former smoker - quit 1 year ago  Acute cholecystitis S/p lap chole 6/28 Dr. Donne Hazel - having bowel function, + flatus - increased stool seen on AXR, will try mag citrate  AKI:  - creatinine up to 1.54 yesterday from 1.08 - little PO intake - gentle IVF bolus in the setting of CHF if creatinine still elevated today  ID - rocephin 6/28>>day#4. Will need 5 total days of antibiotics FEN - IVF, CLD advance as tolerated VTE - SCDs, lovenox  Plan - IVF for elevated creatinine if still elevated, AM labs pending, clear for discharge from a surgical standpoint.    LOS: 4 days    Kalman Drape , [facility] Surgery 04/03/2017, 11:01 AM Pager:  [phone] Consults: [phone] Mon-Fri 7:00 am-4:30 pm Sat-Sun 7:00 am-11:30 am

## 2017-04-03 NOTE — Progress Notes (Signed)
Family Medicine Teaching Service Daily Progress Note Intern Pager: [phone]  Patient name: Arthur [name]. Medical record number: [id number] Date of birth: 10/10/1940 Age: 76 y.o. Gender: male  Primary Care Provider: Wendie Agreste, MD Consultants: General surgery  Code Status: Full    Pt Overview and Major Events to Date:  Admitted to [facility] on 6/28. Lab chole on 6/28.   Assessment and Plan:  Arthur Wolfeis a 75 y.o.malepresenting with epigastric pain, nausea, vomiting. PMH is significant for CHF, COPD, hyperlipidemia, hypertension, left bundle branch block, hypothyroidism (after thyroid was removed for thyroid cancer,), history of prostate cancer, nonischemic cardiomyopathy   1. Acute cholecystitis s/p lap cholecystectomy  Is passing flatus but no BM. Pain is controlled but can range from 1/10 to10/10.  -appreciate recommendations from general surgery for discharge   2. COPD On tiotropium bromide-olodaterol at home  -albuterol prn  -brovana bid   3. HFrEF Last echo showed EF of 45% and grade 1 diastolic dysfunction on 0/63/01. Cardiac cath in 2017 showed no significant CAD. Follows with Arthur [name] -Continue home losartan 100 mg daily  -resume coreg at 6.25 BID, monitor HR   4. Sinus bradycardia and LBBB History of LBBB. Troponin neg x 3 -monitor on tele  -coreg as stated above   5. Hypertension BP today 147/94 - monitor BP - continue home meds  6. Hyperlipidemia  Last lipid panel in 03/09/2017 showing cholesterol of 133, HDL 37, LDL 71  - continue home medication  7. Hypothyroidism  Last TSH within normal limits at 2.170 on 03/09/17 - Continue home levothyroxine 200 g daily  8. Hx of thyroid and prostate cancer Never had chemo or radiation. Just surgery per his report. Does have some urinary incontinence secondary to prostate cancer removal. Wears adult diapers  FEN/GI: heart healthy diet PPx: SCD  Disposition: pending surgical clearance    Subjective:  Arthur [name] is a 76 yo male presenting for epigastric pain, n/v. Patient is s/p lab chole. Patient is doing well and anxious to go home. Patient has passed gas but has not had a BM yet. Patient is clear for discharge from surgery.   Objective: Temp:  [98.4 F (36.9 C)-100 F (37.8 C)] 98.4 F (36.9 C) (07/02 1300) Pulse Rate:  [65-70] 70 (07/02 1300) Resp:  [18] 18 (07/02 1300) BP: (139-147)/(68-94) 147/94 (07/02 1300) SpO2:  [94 %-96 %] 96 % (07/02 1300) Physical Exam: General: NAD. Awake and alert. Pleasant and sitting up in bed.  Cardiovascular: RRR. No MRG. 2+ pulses bilaterally  Respiratory: CTAB, no increased work of breathing  Abdomen: distended, tender to palpation around incision, +BS in all 4 quadrants  Extremities: no edema or cyanosis   Laboratory:  Recent Labs Lab 03/31/17 0138 04/01/17 0438 04/02/17 1101  WBC 11.2* 12.3* 11.0*  HGB 12.8* 13.0 12.4*  HCT 40.0 40.4 38.3*  PLT 176 192 182    Recent Labs Lab 03/30/17 0055  03/31/17 0138 04/02/17 1101 04/03/17 1201  NA 141  --  138 137 137  K 4.5  --  4.5 3.8 4.7  CL 107  --  107 101 103  CO2 27  --  24 28 23   BUN 21*  --  18 18 18   CREATININE 1.24  < > 1.08 1.54* 1.18  CALCIUM 9.5  --  8.9 8.9 9.0  PROT 6.7  --  6.2* 6.4*  --   BILITOT 0.7  --  1.0 0.7  --   ALKPHOS 83  --  88 78  --   ALT 20  --  43 33  --   AST 19  --  59* 34  --   GLUCOSE 129*  --  122* 128* 99  < > = values in this interval not displayed.   Imaging/Diagnostic Tests: DG Abd Portable 2V CLINICAL DATA:  Ileus EXAM: PORTABLE ABDOMEN - 2 VIEW COMPARISON:  03/30/2017 FINDINGS: Nonobstructive bowel gas pattern. Moderate stool throughout the colon. No free air organomegaly. Prior cholecystectomy. IMPRESSION: Moderate stool burden.  No acute findings. Electronically Signed   By: Arthur Baptise M.D.   On: 04/02/2017 11:55  US Abdomen Limited RUQ CLINICAL DATA:  Follow-up from today's CT the abdomen  pelvis EXAM: ULTRASOUND ABDOMEN LIMITED RIGHT UPPER QUADRANT COMPARISON:  CT abdomen pelvis of 03/30/2017 FINDINGS: Gallbladder: The gallbladder is visualized in the gallbladder wall is noted to be thickened there is some gallbladder sludge present but the gallstones noted by CT are not well seen by ultrasound. There is no current pain over the gallbladder with compression. These findings are suspicious for developing acute cholecystitis. Common bile duct: Diameter: Common bile duct is normal measuring 5.3 mm. Liver: The liver is echogenic suggesting fatty infiltration. No focal hepatic abnormality is seen. Incidental right renal cysts are noted, the largest of 6.5 cm. IMPRESSION: 1. Edematous irregular gallbladder wall with some gallbladder sludge. Gallstones noted by CT are not visualized by ultrasound, but these findings are very suspicious for developing acute cholecystitis. Correlate clinically. 2. Echogenic liver parenchyma consistent with diffuse fatty infiltration. Electronically Signed   By: Arthur Drape M.D.   On: 03/30/2017 09:29  CT Abdomen Pelvis w Contrast CLINICAL DATA:  76 y/o  M; epigastric pain. EXAM: CT ABDOMEN AND PELVIS WITH CONTRAST TECHNIQUE: Multidetector CT imaging of the abdomen and pelvis was performed using the standard protocol following bolus administration of intravenous contrast. CONTRAST:  133mL ISOVUE-300 IOPAMIDOL (ISOVUE-300) INJECTION 61% COMPARISON:  09/29/2014 CT of abdomen and pelvis. FINDINGS: Lower chest: No acute abnormality. Hepatobiliary: No focal liver lesion. Mild nonspecific gallbladder wall thickening. 7 mm gallstone within the gallbladder neck. No intra or extrahepatic biliary ductal dilatation. Pancreas: Unremarkable. No pancreatic ductal dilatation or surrounding inflammatory changes. Spleen: Normal in size without focal abnormality. Adrenals/Urinary Tract: Normal adrenal glands. Multiple bilateral renal cysts the  largest in the right upper pole measuring 4.8 cm are stable. No other focal kidney lesion identified. Left kidney lower pole 7 mm stone. No hydronephrosis or obstructive uropathy. Normal bladder. Stomach/Bowel: Stomach is within normal limits. Appendix appears normal. No evidence of bowel wall thickening, distention, or inflammatory changes. Vascular/Lymphatic: Aortic atherosclerosis. No enlarged abdominal or pelvic lymph nodes. Reproductive: Prostatectomy. Other: No abdominal wall hernia or abnormality. No abdominopelvic ascites. Musculoskeletal: Moderate lumbar degenerative changes with multilevel disc space narrowing and lower lumbar facet arthrosis. No acute osseous abnormality identified. IMPRESSION: 1. Gallbladder wall thickening and 7 mm gallstone within the gallbladder neck. Findings may represent acute cholecystitis in the appropriate clinical setting. 2. Left kidney lower pole 7 mm nonobstructing stone. 3. Aortic calcific atherosclerosis. 4. Otherwise stable chronic findings as above. Electronically Signed   By: Kristine Garbe M.D.   On: 03/30/2017 05:17  DG Chest Port 1 View  CLINICAL DATA:  Epigastric and chest pain EXAM: PORTABLE CHEST 1 VIEW COMPARISON:  01/03/2017 FINDINGS: Partially visualized cervical spine hardware. Non inclusion of the CP angles. Mild cardiomegaly. No consolidation. Aortic atherosclerosis. No pneumothorax. IMPRESSION: Mild cardiomegaly.  No edema or infiltrate. Electronically Signed   By: Maudie Mercury  Francoise Ceo M.D.   On: 03/30/2017 01:02  Caroline More, DO 04/03/2017, 2:25 PM PGY-1, [place] Intern pager: [phone], text pages welcome

## 2017-04-03 NOTE — Care Management Note (Signed)
Case Management Note  Patient Details  Name: Arthur [name]. MRN: [id number] Date of Birth: 09-May-1941  Subjective/Objective:  Pt admitted on 03/30/17 with epigastric pain.  PTA, pt independent of ADLS, lives at home.                   Action/Plan: Pt with hx of COPD/CHF/HTN; would benefit from [facility] referral for disease management.  Referral placed accordingly.   Expected Discharge Date:  04/03/17               Expected Discharge Plan:  Home/Self Care  In-House Referral:     Discharge planning Services  CM Consult  Post Acute Care Choice:    Choice offered to:     DME Arranged:    DME Agency:     HH Arranged:    HH Agency:  Rule  Status of Service:  Completed, signed off  If discussed at [employer], dates discussed:    Additional Comments:  Ella Bodo, RN 04/03/2017, 3:00 PM

## 2017-04-04 ENCOUNTER — Other Ambulatory Visit: Payer: Self-pay | Admitting: *Deleted

## 2017-04-04 NOTE — Patient Outreach (Signed)
[facility]) Care Management  04/04/2017  Arthur [name]. [birth year] [id number]  Referral via [place] hospital discharge/copd disease & symptom management:  Telephone call to patient; left HIPPA compliant voice mail.   Plan: Will follow up.  Sherrin Daisy, RN BSN [place] Management Coordinator [facility] Care Management  [phone]

## 2017-04-06 ENCOUNTER — Ambulatory Visit (INDEPENDENT_AMBULATORY_CARE_PROVIDER_SITE_OTHER): Payer: Medicare Other | Admitting: Family Medicine

## 2017-04-06 ENCOUNTER — Other Ambulatory Visit: Payer: Self-pay | Admitting: *Deleted

## 2017-04-06 ENCOUNTER — Encounter: Payer: Self-pay | Admitting: Family Medicine

## 2017-04-06 VITALS — BP 102/62 | HR 65 | Temp 98.6°F | Resp 16 | Ht 72.0 in | Wt 216.0 lb

## 2017-04-06 DIAGNOSIS — Z9049 Acquired absence of other specified parts of digestive tract: Secondary | ICD-10-CM

## 2017-04-06 DIAGNOSIS — R634 Abnormal weight loss: Secondary | ICD-10-CM

## 2017-04-06 DIAGNOSIS — J449 Chronic obstructive pulmonary disease, unspecified: Secondary | ICD-10-CM | POA: Diagnosis not present

## 2017-04-06 DIAGNOSIS — I10 Essential (primary) hypertension: Secondary | ICD-10-CM | POA: Diagnosis not present

## 2017-04-06 NOTE — Patient Instructions (Addendum)
Decrease your losartan to one half pill daily.  Monitor your blood pressure a couple of times a week. The goal is to keep it below 140/90, with ideal readings around 120/70. If it is running around 100 or less on the upper number you should discuss with your physicians again, and probably need to come in. The blood pressure probably is down from the weight loss, but it would be good few to continue to keep the weight off.  Make sure you drink plenty of fluids.  Advise a follow-up check in about 4-6 weeks again with Arthur [name].    IF you received an x-ray today, you will receive an invoice from [facility] Radiology. Please contact [facility] Radiology at [phone] with questions or concerns regarding your invoice.   IF you received labwork today, you will receive an invoice from [facility]. Please contact [facility] at [phone] with questions or concerns regarding your invoice.   Our billing staff will not be able to assist you with questions regarding bills from these companies.  You will be contacted with the lab results as soon as they are available. The fastest way to get your results is to activate your My Chart account. Instructions are located on the last page of this paperwork. If you have not heard from [place] regarding the results in 2 weeks, please contact this office.

## 2017-04-06 NOTE — Patient Outreach (Signed)
[facility]) Care Management  04/06/2017  Arthur [name]. 08-07-1941 [id number]  TOC call attempt x 2. HIPPA compliant voice mail left requesting return call.   Plan:  Follow up call.  Sherrin Daisy, RN BSN [place] Management Coordinator [facility] Care Management  [phone]

## 2017-04-06 NOTE — Progress Notes (Signed)
Patient ID: Arthur Quince., male    DOB: Jul 01, 1941  Age: 76 y.o. MRN: [id number]  Chief Complaint  Patient presents with  . Follow-up    gall bladder removal     Subjective:   76 year old gentleman who had an acute gallbladder attack and surgery recently, and is here for a postop check. He is recovering satisfactorily. Appetite is still gradually increasing. He has pain in the abdomen when he moves around, but no major acute symptoms. A little nausea but no vomiting. No chest pain or breathing issues other than his normal breathing problems postop. No edema. Bowels are moving. Current allergies, medications, problem list, past/family and social histories reviewed.  Objective:  BP 102/62   Pulse 65   Temp 98.6 F (37 C) (Oral)   Resp 16   Ht 6' (1.829 m)   Wt 216 lb (98 kg)   SpO2 93%   BMI 29.29 kg/m   No major acute distress. Neck supple. Chest clear to auscultation. Heart sounds are faint, irregular. Abdomen has bowel sounds present. The surgical wounds all well healing. He has some bruising of her lower abdominal wall. Extremities normal.  Blood pressure was noted and discussed and reviewed old pressures. He has lost significant weight through this.  Assessment & Plan:   Assessment: 1. Essential hypertension   2. Loss of weight   3. History of cholecystectomy   4. Chronic obstructive pulmonary disease, unspecified COPD type (Arthur [name])       Plan: I think we should cut back on his losartan. Otherwise he looks good and seems to be having good recovery.  No orders of the defined types were placed in this encounter.   No orders of the defined types were placed in this encounter.        Patient Instructions   Decrease your losartan to one half pill daily.  Monitor your blood pressure a couple of times a week. The goal is to keep it below 140/90, with ideal readings around 120/70. If it is running around 100 or less on the upper number you should discuss with  your physicians again, and probably need to come in. The blood pressure probably is down from the weight loss, but it would be good few to continue to keep the weight off.  Make sure you drink plenty of fluids.  Advise a follow-up check in about 4-6 weeks again with Dr. Carlota Raspberry.    IF you received an x-ray today, you will receive an invoice from [facility] Radiology. Please contact [facility] Radiology at [phone] with questions or concerns regarding your invoice.   IF you received labwork today, you will receive an invoice from [facility]. Please contact [facility] at [phone] with questions or concerns regarding your invoice.   Our billing staff will not be able to assist you with questions regarding bills from these companies.  You will be contacted with the lab results as soon as they are available. The fastest way to get your results is to activate your My Chart account. Instructions are located on the last page of this paperwork. If you have not heard from [place] regarding the results in 2 weeks, please contact this office.         Return in about 6 weeks (around 05/18/2017).   Arthur Loeber, MD 04/06/2017

## 2017-04-07 ENCOUNTER — Other Ambulatory Visit: Payer: Self-pay | Admitting: *Deleted

## 2017-04-07 ENCOUNTER — Other Ambulatory Visit: Payer: Self-pay | Admitting: Pharmacist

## 2017-04-07 ENCOUNTER — Encounter: Payer: Self-pay | Admitting: *Deleted

## 2017-04-07 NOTE — Patient Outreach (Signed)
[facility]) Care Management  04/07/2017  Arthur [name]. [birth year] [id number]  1508:   Unsuccessful phone outreach to patient.  HIPAA compliant message left requesting return call.   1517:   Patient returned call.  HIPAA details verified.  Explained purpose of call.    Patient was referred to [place] by [facility] Liaison, [place], for patient reported cost concerns with his inhaler.   Patient reports he has samples of [name] (tiotropium/olodaterol) from his pulmonologist, Dr Elsworth Soho.  Per chart review from 03/21/17 note, appears [name] may have required a prior authorization.  It does not appear to be on formulary for patient's Medicare Part D plan---patient reports co-insurance is too high for [name].   Discussed the following options with patient:  1) Patient could apply to [employer] to see if he is eligible for manufacturer patient assistance for [employer].   2) Appears Anoro (umeclidinium/vilanterol) is Tier 3 on patient's Part D plan $47 co-pay/30 day supply in initial coverage phase.    Patient would like to see what Dr Elsworth Soho prefers to do first.    Plan:  Will send message to Dr Elsworth Soho per patient request.    Will follow-up with patient once response is received from Dr Elsworth Soho.   Karrie Meres, PharmD, [place] [phone]

## 2017-04-07 NOTE — Patient Outreach (Signed)
[facility]) Care Management  04/07/2017  [place] 1941/03/06 [id number]  Transition of care: Hospital  Admission 6/28-04/03/2017 DX: calculus of gallbladder with cholecystitis & obstruction Procedure: laparoscopic cholecystectomy  Telephone call to patient after 2 previous attempts. Patient advised of reason for call & [facility] care management services.  HIPPA verification received from patient.   Patient voices that he completed follow up visit with primary care yesterday(states BP was low & MD adjusted BP medication). patient voices he did not have any symptoms accompanying the low blood pressure.  States he has follow up with surgeon 7/12. States gallbladder removal site is okay. States he knows signs of infection (pain, swelling, fever, drainage) and will report to MD if it occurs.  Patient agrees  to [facility] assessment & medication review which was completed. Voices no concerns at this time. Will need to call back to complete other health assessments. Patient agrees with plan & call back next week.   Plan: Send involvement letter to MD. Will call back next week to continue assessments & develop care plan.Sherrin Daisy, RN BSN [place] Management Coordinator [facility] Care Management  [phone]

## 2017-04-09 ENCOUNTER — Telehealth: Payer: Self-pay | Admitting: Pulmonary Disease

## 2017-04-09 NOTE — Telephone Encounter (Signed)
-----   Message from Lin Givens, [facility] sent at 04/07/2017  4:58 PM EDT ----- Dr Elsworth Soho:  [facility] Pharmacist spoke with patient regarding Siolto cost concerns.  Noted your office got a PA for this med and patient reports it is too expensive.    Appears Anoro may be formulary long acting anti-cholinergic/LABA combo on his Part D plan.   Patient wanted me to ask what agent you prefer he take---if he needs [name] he can apply to [employer] to see if he is eligible for manufacturer assistance.    Thanks for your time.  Karrie Meres, PharmD, [place] [phone]

## 2017-04-09 NOTE — Telephone Encounter (Signed)
Anoro would be just as good for him  Of note, Was given ANORO 2017 but insurance did not cover this . But OK to prescribe again if this is covered in 2018

## 2017-04-10 MED ORDER — UMECLIDINIUM-VILANTEROL 62.5-25 MCG/INH IN AEPB: 1.0000 | INHALATION_SPRAY | Freq: Every day | RESPIRATORY_TRACT | 3 refills | Status: DC

## 2017-04-10 NOTE — Telephone Encounter (Signed)
Looked at formulary for 2018. It appears that Anoro is covered this year. Called patient asked about [name]. He stated that it was way too expensive with a price of $200 per month. Asked if he was willing to go back to [facility], he stated yes. Per the pt, the pharmacist at [facility] told him his RX would be around $45 a month.   RX for Anoro will be called into [facility] on [address]. Pt is aware. Nothing else needed at time of call.

## 2017-04-12 ENCOUNTER — Other Ambulatory Visit: Payer: Self-pay | Admitting: *Deleted

## 2017-04-12 NOTE — Patient Outreach (Signed)
[facility]) Care Management  [place]  04/12/2017   Arthur [name]. Oct 07, 1940 [id number]   Transition of care call to patient.  HIPPA verification received from patient.    Subjective:  Patient voices that surgical wound area is without drainage or pain. States he is not experiencing fevers since recent  gallbladder surgery. Voices that he will report to MD if he has symptoms of infection. Voices that he has attended hospital follow up appointment with primary care provider and has appointment with surgeon 7/12. States taking medications as prescribed.  Voices that he has not been to emergency room or been readmitted since recent hospital discharge on 04/03/2017.   Objective:  Please see health assessments that have been completed.  Care plan is noted below.   Encounter Medications:  Outpatient Encounter Prescriptions as of 04/12/2017  Medication Sig Note  . atorvastatin (LIPITOR) 10 MG tablet Take 1 tablet (10 mg total) by mouth daily at 6 PM.   . carvedilol (COREG) 6.25 MG tablet Take 1 tablet (6.25 mg total) by mouth 2 (two) times daily with a meal.   . levothyroxine (SYNTHROID, LEVOTHROID) 200 MCG tablet take 1 tablet by mouth once daily .   . losartan (COZAAR) 100 MG tablet Take 1 tablet (100 mg total) by mouth daily.   [name] losartan (COZAAR) 100 MG tablet Take 50 mg by mouth daily. 04/07/2017: Patient reports he saw PCP for f/u visit 04/06/17 and BP was low. States  He changed dose of medication to 1/2 of 100 mg pill daily.  [name] senna-docusate (SENOKOT-S) 8.6-50 MG tablet Take 1 tablet by mouth 2 (two) times daily. 04/07/2017: Patient not taking every day, taking as needed; took one time this week  . umeclidinium-vilanterol (ANORO ELLIPTA) 62.5-25 MCG/INH AEPB Inhale 1 puff into the lungs daily.    No facility-administered encounter medications on file as of 04/12/2017.     Functional Status:  In your present state of health, do you have any difficulty  performing the following activities: 04/12/2017 04/02/2017  Hearing? N N  Vision? N N  Difficulty concentrating or making decisions? N N  Walking or climbing stairs? N N  Dressing or bathing? N N  Doing errands, shopping? N N  Some recent data might be hidden    Fall/Depression Screening: Fall Risk  04/07/2017 04/06/2017 03/09/2017  Falls in the past year? No No No   PHQ 2/9 Scores 04/07/2017 04/06/2017 03/09/2017 01/03/2017 12/13/2016 12/09/2016 12/06/2016  PHQ - 2 Score 0 0 0 0 0 0 0     Assessment:  Patient lives alone & is independent in his care. Has no problems attending MD appointments.  Plan:  Will continue Transition of care calls & follow care plan as noted below.    [facility] CM Care Plan Problem One     Most Recent Value  Care Plan Problem One  At risk for hospital readmission  Role Documenting the Problem One  Care Management Telephonic [place] for Problem One  Active  THN Long Term Goal   Avoid readmission within 14 days of previous discharge  THN Long Term Goal Start Date  04/07/17  Interventions for Problem One Long Term Goal  Advise pt of reportable sxs to MD, importance of attending MD appots & taking meds as directed  THN CM Short Term Goal #1    Patient will report attendance to hospital f/u appt within 14 days  THN CM Short Term Goal #1 Start Date  04/07/17  THN CM Short Term Goal #1 Met Date  04/12/17 [attended appt -04/06/17]  Interventions for Short Term Goal #1  Advise of importance of hospital,  f/u appt  THN CM Short Term Goal #2   Pt will report 3 sxs of infection & state action plan if occurs wiithin 14 days  THN CM Short Term Goal #2 Start Date  04/07/17  Interventions for Short Term Goal #2  advise patient of signs of infection & importance of reporting to MD    Appointment for call made with patient agreement.  Sherrin Daisy, RN BSN [place] Management Coordinator [facility] Care Management  [phone]

## 2017-04-13 ENCOUNTER — Encounter: Payer: Self-pay | Admitting: *Deleted

## 2017-04-13 ENCOUNTER — Other Ambulatory Visit: Payer: Self-pay | Admitting: Pharmacist

## 2017-04-13 NOTE — Patient Outreach (Signed)
[facility]) Care Management  04/13/2017  Arthur [name]. 09-19-1941 [id number]  Unsuccessful phone outreach to patient.  HIPAA compliant message left requesting return call.  Received message back from Dr Elsworth Soho, [name] would be okay for patient in place of [name] ([name] was on patient's insurance formulary).  Per notes in chart from 04/09/17, Dr Elsworth Soho already changed prescription and sent prescription to his pharmacy.   Plan:  If no return call, will make another outreach attempt next week.   Karrie Meres, PharmD, [place] [phone]

## 2017-04-17 ENCOUNTER — Other Ambulatory Visit: Payer: Self-pay | Admitting: Pharmacist

## 2017-04-17 NOTE — Patient Outreach (Signed)
[facility]) Care Management  04/17/2017  [place] 1941/03/26 [id number]  Second unsuccessful phone outreach to patient.  No answer, HIPAA compliant message left requesting return call.   Plan:  If no return call, will make another outreach attempt next week.    Karrie Meres, PharmD, [place] [phone]

## 2017-04-19 ENCOUNTER — Other Ambulatory Visit: Payer: Self-pay | Admitting: *Deleted

## 2017-04-19 NOTE — Patient Outreach (Signed)
[facility]) Care Management  04/19/2017  Henry Utsey. 28-May-1941 [id number]  Transition of care call.  Telephone call attempt; left HIPPA compliant voice mail requesting return call.   Plan: Will follow up. Continue to follow care plan, Complete health assessments(vision, hearing)  Sherrin Daisy, RN BSN [place] Management Coordinator [facility] Care Management  [phone]

## 2017-04-20 ENCOUNTER — Other Ambulatory Visit: Payer: Self-pay | Admitting: *Deleted

## 2017-04-20 NOTE — Patient Outreach (Signed)
[place] [facility]) Care Management  04/20/2017  [name] May 15, 1941 [id number]  Transition of care: Follow up call attempt x 2; left message requesting call back.  Plan: Will follow up.  Sherrin Daisy, RN BSN [place] Management Coordinator [facility] Care Management  [phone]

## 2017-04-21 ENCOUNTER — Encounter: Payer: Self-pay | Admitting: Pharmacist

## 2017-04-21 ENCOUNTER — Other Ambulatory Visit: Payer: Self-pay | Admitting: Pharmacist

## 2017-04-21 NOTE — Patient Outreach (Signed)
[facility]) Care Management  04/21/2017  [place] [birth year] [id number]  Third unsuccessful phone outreach to patient.  HIPAA compliant message left requesting return call.   Plan:  Will mail outreach letter---if no return call in 10 business days, will close case.    Karrie Meres, PharmD, [place] [phone]

## 2017-04-24 ENCOUNTER — Encounter: Payer: Self-pay | Admitting: *Deleted

## 2017-04-24 ENCOUNTER — Other Ambulatory Visit: Payer: Self-pay | Admitting: *Deleted

## 2017-04-24 NOTE — Patient Outreach (Addendum)
[place] [facility]) Care Management  04/24/2017  [place] 05-13-1941 [id number]  Transition of care.  Telephone call attempt 3 for follow up. Spoke with patient who voices that he started back working & gets home around 9 PM on most days. States not working today.  Patient voices that he is feeling well. States he has not had any signs of infection in operative area. Patient was able to voices 3 signs of infection that are reportable.  States he has not has any readmissions or emergency room visits since recent hospital stay.   Voices that he is taking medications as prescribed by his MD.   Patient advised that [facility] pharmacist has been trying to get in touch with him. States he will try to call him call. States he is currently taking an alternate medication that is more affordable.   Care plan goals have been met. Plan has been updated as noted.  Patient was advised of disease management program offered by [facility] with monthly calls. States he feels comfortable with the knowledge that he has to manage his COPD.  Also states with going back to work he would not be home to take calls because he often is not home til 9 PM.  Patients agrees with case closure. Care plan goals have been met.  Plan: RN Care coordinator will close out of case. Will send communication to MD Will advise pharmacist.  Sherrin Daisy, RN BSN [place] Management Coordinator [facility] Care Management  [phone]

## 2017-05-08 ENCOUNTER — Other Ambulatory Visit: Payer: Self-pay | Admitting: Pharmacist

## 2017-05-08 NOTE — Patient Outreach (Signed)
[facility]) Care Management  05/08/2017  [place] Aug 15, 1941 [id number]  Three phone outreach attempts made and outreach letter mailed to patient with no response from patient.  Required number of outreach attempts have been made to patient.   Plan:  Will close case given inability to maintain contact with patient.   Karrie Meres, PharmD, [place] [phone]

## 2017-05-18 ENCOUNTER — Ambulatory Visit (INDEPENDENT_AMBULATORY_CARE_PROVIDER_SITE_OTHER): Payer: Medicare Other | Admitting: Family Medicine

## 2017-05-18 ENCOUNTER — Encounter: Payer: Self-pay | Admitting: Family Medicine

## 2017-05-18 VITALS — BP 136/77 | HR 61 | Temp 98.1°F | Resp 16 | Ht 72.0 in | Wt 225.0 lb

## 2017-05-18 DIAGNOSIS — J449 Chronic obstructive pulmonary disease, unspecified: Secondary | ICD-10-CM

## 2017-05-18 DIAGNOSIS — I10 Essential (primary) hypertension: Secondary | ICD-10-CM

## 2017-05-18 DIAGNOSIS — Z9049 Acquired absence of other specified parts of digestive tract: Secondary | ICD-10-CM

## 2017-05-18 MED ORDER — CARVEDILOL 6.25 MG PO TABS: 6.2500 mg | ORAL_TABLET | Freq: Two times a day (BID) | ORAL | 0 refills | Status: DC
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
## 2017-05-18 NOTE — Patient Instructions (Addendum)
Blood pressure appears to be creeping back up. If blood pressure is over 140/90 - return to full pill of losartan each day.  No change in carvedilol dosing for now. Keep follow up with [name] next week. Keep a record of your blood pressures outside of the office and bring them to the next office visit.  I sent in an additional month of the carvedilol, but please discuss that medication with your cardiologist as you may need more refills prior to follow-up with me in December. He should have enough refills of your other medicines until December follow-up.  If any pus or redness around belly button, or other surgery scars - return for recheck as it may be an infection. Those areas look good today.    IF you received an x-ray today, you will receive an invoice from [facility]. Please contact [facility] Radiology at [phone] with questions or concerns regarding your invoice.   IF you received labwork today, you will receive an invoice from [place]. Please contact [facility] at [phone] with questions or concerns regarding your invoice.   Our billing staff will not be able to assist you with questions regarding bills from these companies.  You will be contacted with the lab results as soon as they are available. The fastest way to get your results is to activate your My Chart account. Instructions are located on the last page of this paperwork. If you have not heard from [place] regarding the results in 2 weeks, please contact this office.
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
## 2017-05-24 ENCOUNTER — Encounter: Payer: Self-pay | Admitting: Cardiovascular Disease

## 2017-05-24 ENCOUNTER — Ambulatory Visit (INDEPENDENT_AMBULATORY_CARE_PROVIDER_SITE_OTHER): Payer: Medicare Other | Admitting: Cardiovascular Disease

## 2017-05-24 VITALS — BP 138/76 | HR 66 | Ht 72.0 in | Wt 231.8 lb

## 2017-05-24 DIAGNOSIS — I428 Other cardiomyopathies: Secondary | ICD-10-CM | POA: Diagnosis not present

## 2017-05-24 NOTE — Patient Instructions (Addendum)
Medication Instructions:  Your physician recommends that you continue on your current medications as directed. Please refer to the Current Medication list given to you today.  Labwork: NONE  Testing/Procedures: Your physician has requested that you have an echocardiogram in August 2019. Echocardiography is a painless test that uses sound waves to create images of your heart. It provides your doctor with information about the size and shape of your heart and how well your heart's chambers and valves are working. This procedure takes approximately one hour. There are no restrictions for this procedure.  Follow-Up: Your physician wants you to follow-up in: 12 months with Dr. Nishan. You will receive a reminder letter in the mail two months in advance. If you don't receive a letter, please call our office to schedule the follow-up appointment.   If you need a refill on your cardiac medications before your next appointment, please call your pharmacy.    

## 2017-05-27 ENCOUNTER — Other Ambulatory Visit: Payer: Self-pay | Admitting: Family Medicine

## 2017-05-27 ENCOUNTER — Other Ambulatory Visit: Payer: Self-pay | Admitting: Cardiology

## 2017-05-29 NOTE — Telephone Encounter (Signed)
Patient needs to call cardiologist for refill of medication.

## 2017-09-14 ENCOUNTER — Other Ambulatory Visit: Payer: Self-pay

## 2017-09-14 ENCOUNTER — Ambulatory Visit (INDEPENDENT_AMBULATORY_CARE_PROVIDER_SITE_OTHER): Payer: Medicare Other | Admitting: Family Medicine

## 2017-09-14 ENCOUNTER — Encounter: Payer: Self-pay | Admitting: Family Medicine

## 2017-09-14 VITALS — BP 132/80 | HR 80 | Temp 98.5°F | Resp 18 | Ht 72.0 in | Wt 227.2 lb

## 2017-09-14 DIAGNOSIS — J441 Chronic obstructive pulmonary disease with (acute) exacerbation: Secondary | ICD-10-CM | POA: Diagnosis not present

## 2017-09-14 DIAGNOSIS — Z23 Encounter for immunization: Secondary | ICD-10-CM | POA: Diagnosis not present

## 2017-09-14 DIAGNOSIS — E785 Hyperlipidemia, unspecified: Secondary | ICD-10-CM | POA: Diagnosis not present

## 2017-09-14 DIAGNOSIS — I10 Essential (primary) hypertension: Secondary | ICD-10-CM

## 2017-09-14 DIAGNOSIS — E039 Hypothyroidism, unspecified: Secondary | ICD-10-CM

## 2017-09-14 MED ORDER — CARVEDILOL 6.25 MG PO TABS: ORAL_TABLET | ORAL | 1 refills | Status: DC

## 2017-09-14 MED ORDER — PREDNISONE 20 MG PO TABS: 40.0000 mg | ORAL_TABLET | Freq: Every day | ORAL | 0 refills | Status: DC

## 2017-09-14 MED ORDER — LEVOTHYROXINE SODIUM 200 MCG PO TABS: ORAL_TABLET | ORAL | 1 refills | Status: DC

## 2017-09-14 MED ORDER — AZITHROMYCIN 250 MG PO TABS: ORAL_TABLET | ORAL | 0 refills | Status: DC

## 2017-09-14 MED ORDER — LOSARTAN POTASSIUM 50 MG PO TABS: 50.0000 mg | ORAL_TABLET | Freq: Every day | ORAL | 1 refills | Status: DC

## 2017-09-14 NOTE — Patient Instructions (Addendum)
  Start azithromycin and prednisone for cough which may be COPD flare. Return to the clinic or go to the nearest emergency room if any of your symptoms worsen or new symptoms occur.  Recheck blood pressure was better. No change in  meds for now.   Follow up in 6 months for wellness visit.   Thanks for coming in today.     IF you received an x-ray today, you will receive an invoice from [facility]. Please contact [facility] Radiology at [phone] with questions or concerns regarding your invoice.   IF you received labwork today, you will receive an invoice from [place]. Please contact [facility] at [phone] with questions or concerns regarding your invoice.   Our billing staff will not be able to assist you with questions regarding bills from these companies.  You will be contacted with the lab results as soon as they are available. The fastest way to get your results is to activate your My Chart account. Instructions are located on the last page of this paperwork. If you have not heard from [place] regarding the results in 2 weeks, please contact this office.
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
## 2017-09-15 LAB — COMPREHENSIVE METABOLIC PANEL
ALBUMIN: 4.5 g/dL (ref 3.5–4.8)
ALK PHOS: 125 IU/L — AB (ref 39–117)
ALT: 20 IU/L (ref 0–44)
AST: 18 IU/L (ref 0–40)
Albumin/Globulin Ratio: 1.6 (ref 1.2–2.2)
BUN / CREAT RATIO: 16 (ref 10–24)
BUN: 14 mg/dL (ref 8–27)
Bilirubin Total: 0.4 mg/dL (ref 0.0–1.2)
CO2: 26 mmol/L (ref 20–29)
CREATININE: 0.9 mg/dL (ref 0.76–1.27)
Calcium: 9.8 mg/dL (ref 8.6–10.2)
Chloride: 99 mmol/L (ref 96–106)
GFR calc non Af Amer: 83 mL/min/{1.73_m2} (ref >59)
GFR, EST AFRICAN AMERICAN: 96 mL/min/{1.73_m2} (ref >59)
GLOBULIN, TOTAL: 2.8 g/dL (ref 1.5–4.5)
GLUCOSE: 108 mg/dL — AB (ref 65–99)
Potassium: 4.6 mmol/L (ref 3.5–5.2)
SODIUM: 141 mmol/L (ref 134–144)
TOTAL PROTEIN: 7.3 g/dL (ref 6.0–8.5)

## 2017-09-15 LAB — LIPID PANEL
CHOL/HDL RATIO: 4.4 ratio (ref 0.0–5.0)
Cholesterol, Total: 163 mg/dL (ref 100–199)
HDL: 37 mg/dL — ABNORMAL LOW (ref >39)
LDL CALC: 104 mg/dL — AB (ref 0–99)
Triglycerides: 111 mg/dL (ref 0–149)
VLDL CHOLESTEROL CAL: 22 mg/dL (ref 5–40)

## 2017-09-15 LAB — TSH: TSH: 4.31 u[IU]/mL (ref 0.450–4.500)

## 2017-09-20 ENCOUNTER — Encounter: Payer: Self-pay | Admitting: Adult Health

## 2017-09-20 ENCOUNTER — Ambulatory Visit (INDEPENDENT_AMBULATORY_CARE_PROVIDER_SITE_OTHER): Payer: Medicare Other | Admitting: Adult Health

## 2017-09-20 DIAGNOSIS — R918 Other nonspecific abnormal finding of lung field: Secondary | ICD-10-CM | POA: Diagnosis not present

## 2017-09-20 DIAGNOSIS — J441 Chronic obstructive pulmonary disease with (acute) exacerbation: Secondary | ICD-10-CM | POA: Diagnosis not present

## 2017-09-20 MED ORDER — UMECLIDINIUM-VILANTEROL 62.5-25 MCG/INH IN AEPB: 1.0000 | INHALATION_SPRAY | Freq: Every day | RESPIRATORY_TRACT | 0 refills | Status: DC

## 2017-09-20 NOTE — Assessment & Plan Note (Signed)
Stable on previous CT chest , not present on  cxr this year.

## 2017-09-20 NOTE — Assessment & Plan Note (Addendum)
Mild flare now resolving with abx and steroids  Finish Zpack and prednisone today  mucinex  As needed     Plan  Patient Instructions  Continue on current regimen Follow-up with Dr. Elsworth Soho in 6 months and as needed

## 2017-09-20 NOTE — Progress Notes (Signed)
@Patient  ID: Arthur Quince., male    DOB: 12-01-1940, 76 y.o.   MRN: [id number]  Chief Complaint  Patient presents with  . Follow-up    COPD     Referring provider: Wendie Agreste, MD  HPI: 76 year old male former smoker followed for COPD and multiple pulmonary nodules Has CHF with a EF of 45%  Significant tests/ events reviewed  2D Echo 05/2016 Moderate to severe global reduction in LV function; mild LVH; grade 1 diastolic dysfunction with elevated LV filling pressure; mild biatrial enlargement; trace AI, MR and TR. EF 30-35% Spirometry 2016 >FEV1 66% , ratio 63.   CT chest 09/2015-nodule stable or decreased compared to 02/2015  09/20/2017 Follow up: COPD , Pulmonary Nodules  Patient returns for a six-month follow-up.  Patient has known moderate COPD.  He was tried on [employer] last ov but did care for it. Went back to [place].  Says over last week has had increased cough and congestion . Seen by PCP , given zpack and prednisone taper which he finishes today. Still coughing up thick up white mucus.  Starting to feel some better.   Denies chest pain hemoptysis orthopnea or edema. Appetite is okay .   Patient has known multiple pulmonary nodules.  Previous CT chest 2016 showed stable nodules to decreased.. Chest x-ray June 2018 with no obvious nodules noted.  Influenza, Pneumovax, Prevnar 13 up-to-date     Allergies  Allergen Reactions  . Doxycycline Other (See Comments)    Esophagitis, severe gi bleed    Immunization History  Administered Date(s) Administered  . Influenza Split 08/01/2011, 08/06/2012  . Influenza, High Dose Seasonal PF 06/27/2016  . Influenza,inj,Quad PF,6+ Mos 06/27/2013, 09/18/2014, 06/08/2015, 09/14/2017  . Pneumococcal Conjugate-13 10/20/2015  . Pneumococcal Polysaccharide-23 01/10/2000, 10/26/2007  . Tdap 04/26/2010    Past Medical History:  Diagnosis Date  . Arthritis   . Bifascicular block    a. 1st degree AVB/LBBB.  [name]  Chronic systolic CHF (congestive heart failure) ([place])   . COPD (chronic obstructive pulmonary disease) ([place])   . ED (erectile dysfunction)   . Essential hypertension, benign    chris guess  pcp  . GERD (gastroesophageal reflux disease)   . Hypercholesterolemia   . Kidney stone    renal stone  . LBBB (left bundle branch block) 10/2007  . Migraines   . Mild dietary indigestion   . NICM (nonischemic cardiomyopathy) ([place])   . Nonischemic cardiomyopathy ([place])   . Prostate cancer ([place])   . Reflux esophagitis   . Shortness of breath dyspnea    W/ EXERTION   . Spider bite   . Thyroid cancer ([place])   . Tobacco use disorder   . Unspecified hypothyroidism     Tobacco History: Social History   Tobacco Use  Smoking Status Former Smoker  . Packs/day: 0.50  . Years: 40.00  . Pack years: 20.00  . Types: Cigarettes  . Last attempt to quit: 04/04/2016  . Years since quitting: 1.4  Smokeless Tobacco Never Used  Tobacco Comment   4-5 cigs per week 06/27/16   Counseling given: Not Answered Comment: 4-5 cigs per week 06/27/16   Outpatient Encounter Medications as of 09/20/2017  Medication Sig  . carvedilol (COREG) 6.25 MG tablet take 1 tablet (6.25 MG) by mouth twice a day with A MEAL  . levothyroxine (SYNTHROID, LEVOTHROID) 200 MCG tablet take 1 tablet by mouth once daily .  . losartan (COZAAR) 50 MG tablet Take 1 tablet (50 mg total)  by mouth daily.  [name] umeclidinium-vilanterol (ANORO ELLIPTA) 62.5-25 MCG/INH AEPB Inhale 1 puff into the lungs daily.  [name] azithromycin (ZITHROMAX) 250 MG tablet Take 2 pills by mouth on day 1, then 1 pill by mouth per day on days 2 through 5. (Patient not taking: Reported on 09/20/2017)  . predniSONE (DELTASONE) 20 MG tablet Take 2 tablets (40 mg total) by mouth daily with breakfast. (Patient not taking: Reported on 09/20/2017)  . umeclidinium-vilanterol (ANORO ELLIPTA) 62.5-25 MCG/INH AEPB Inhale 1 puff into the lungs daily.   No facility-administered encounter  medications on file as of 09/20/2017.      Review of Systems  Constitutional:   No  weight loss, night sweats,  Fevers, chills,  +fatigue, or  lassitude.  HEENT:   No headaches,  Difficulty swallowing,  Tooth/dental problems, or  Sore throat,                No sneezing, itching, ear ache, nasal congestion, post nasal drip,   CV:  No chest pain,  Orthopnea, PND, swelling in lower extremities, anasarca, dizziness, palpitations, syncope.   GI  No heartburn, indigestion, abdominal pain, nausea, vomiting, diarrhea, change in bowel habits, loss of appetite, bloody stools.   Resp:    No chest wall deformity  Skin: no rash or lesions.  GU: no dysuria, change in color of urine, no urgency or frequency.  No flank pain, no hematuria   MS:  No joint pain or swelling.  No decreased range of motion.  No back pain.    Physical Exam  BP 128/72 (BP Location: Left Arm, Cuff Size: Normal)   Pulse 71   Ht 6' (1.829 m)   Wt 231 lb 9.6 oz (105.1 kg)   SpO2 97%   BMI 31.41 kg/m    GEN: A/Ox3; pleasant , NAD, overweight    HEENT:  [place]/AT,  EACs-clear, TMs-wnl, NOSE-clear, THROAT-clear, no lesions, no postnasal drip or exudate noted.   NECK:  Supple w/ fair ROM; no JVD; normal carotid impulses w/o bruits; no thyromegaly or nodules palpated; no lymphadenopathy.    RESP  Decreased BS in bases , . no accessory muscle use, no dullness to percussion  CARD:  RRR, no m/r/g, no peripheral edema, pulses intact, no cyanosis or clubbing.  GI:   Soft & nt; nml bowel sounds; no organomegaly or masses detected.   Musco: Warm bil, no deformities or joint swelling noted.   Neuro: alert, no focal deficits noted.    Skin: Warm, no lesions or rashes    Lab Results:   Imaging: No results found.   Assessment & Plan:   COPD exacerbation ([place]) Mild flare now resolving with abx and steroids  Finish Zpack and prednisone today  mucinex  As needed     Plan  Patient Instructions  Continue on  current regimen Follow-up with Dr. Elsworth Soho in 6 months and as needed     Multiple lung nodules on CT Stable on previous CT chest , not present on  cxr this year.      Arthur Edison, NP 09/20/2017

## 2017-09-20 NOTE — Patient Instructions (Signed)
Continue on current regimen Follow-up with Dr. Elsworth Soho in 6 months and as needed

## 2017-09-23 IMAGING — CT CT CHEST W/O CM
2 of 3 series · 15 of 36 positions shown, 18 images · non-contrast
Comparison: Chest CT 02/10/2015.

CLINICAL DATA: 74-year-old male with history of pulmonary nodules.
Followup study.

EXAM:
CT CHEST WITHOUT CONTRAST
TECHNIQUE: Multidetector CT imaging of the chest was performed following the
standard protocol without IV contrast.

[Series 2: thorax · axial · 0.85mm/px · z∈[-65,+225]mm · 12 of 68 slices shown, 15 images]
[im 5/68  mediastinal]
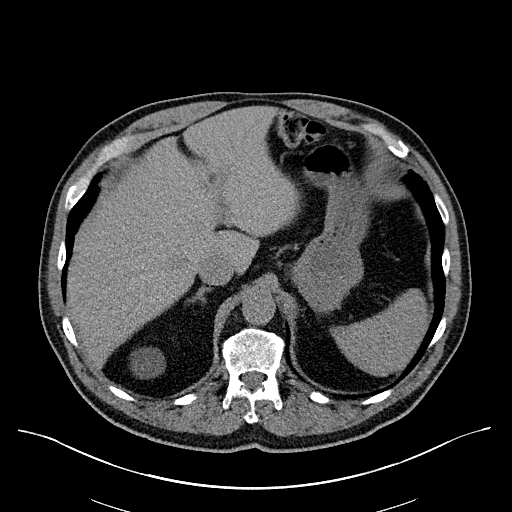
[im 5/68  lung]
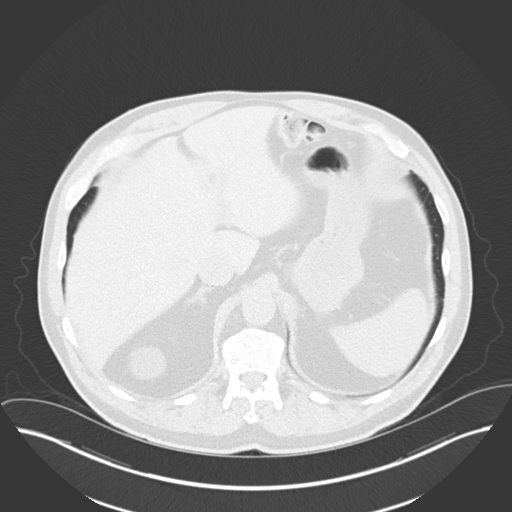
[im 10/68  lung]
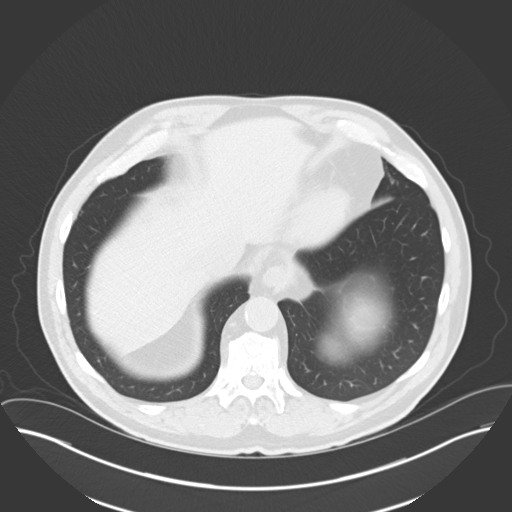
[im 15/68  lung]
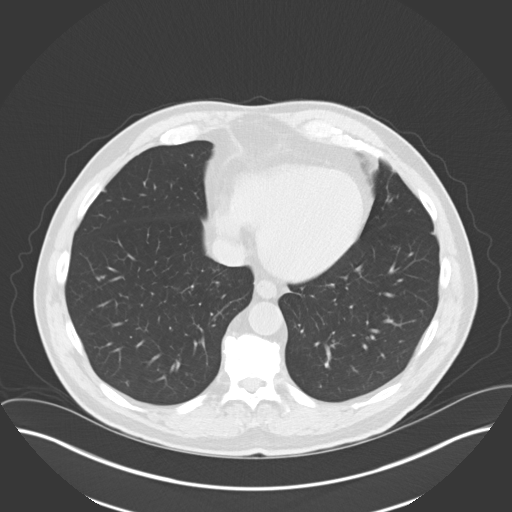
[im 20/68  lung]
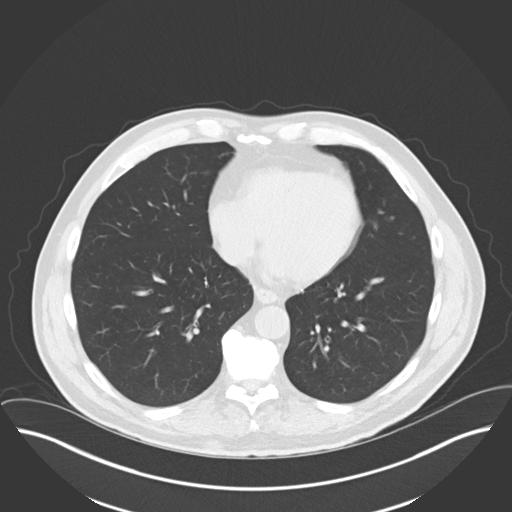
[im 25/68  mediastinal]
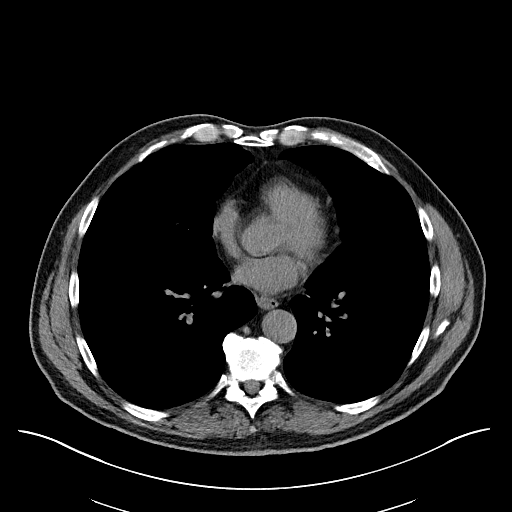
[im 25/68  lung]
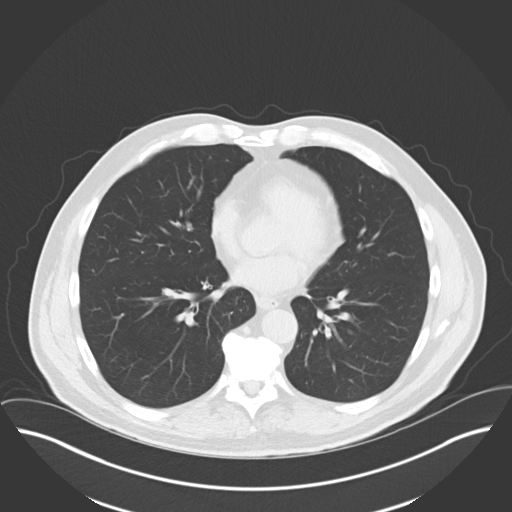
[im 30/68  lung]
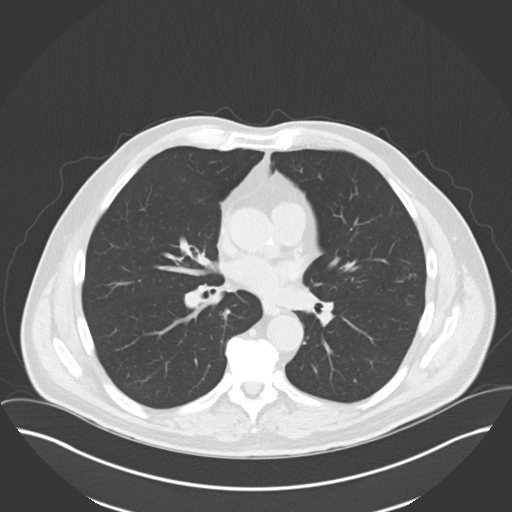
[im 38/68  lung]
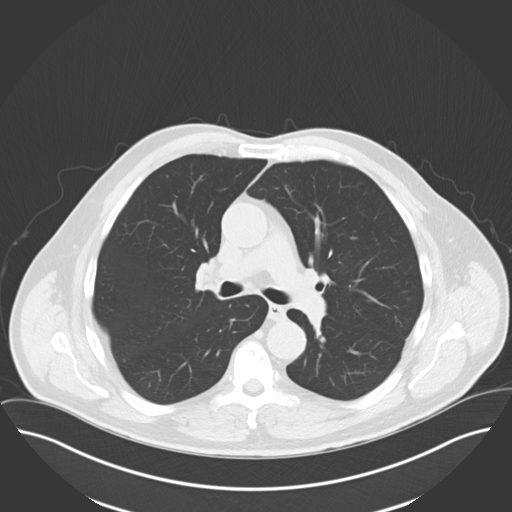
[im 43/68  lung]
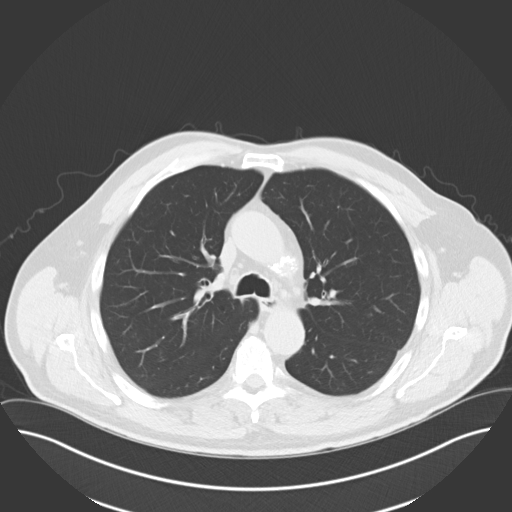
[im 48/68  mediastinal]
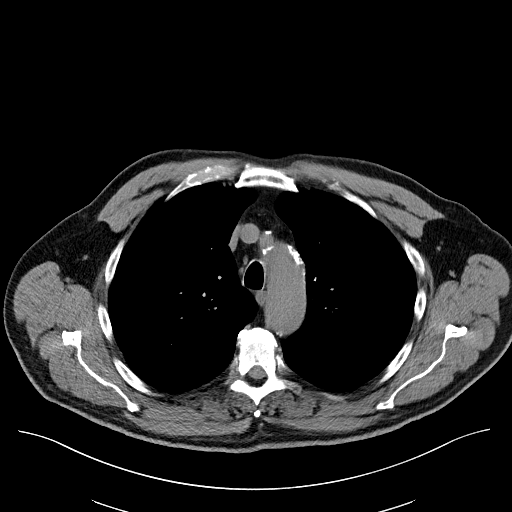
[im 48/68  lung]
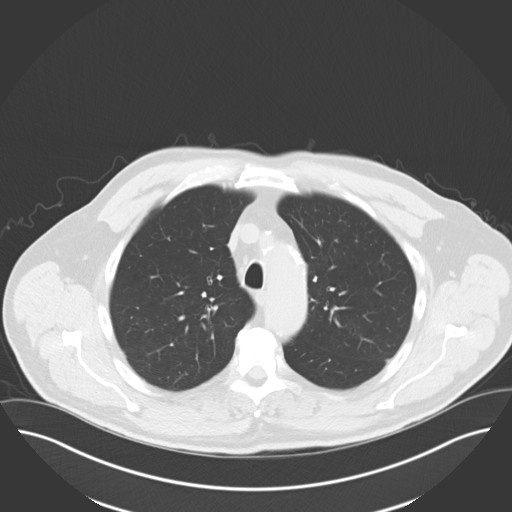
[im 53/68  lung]
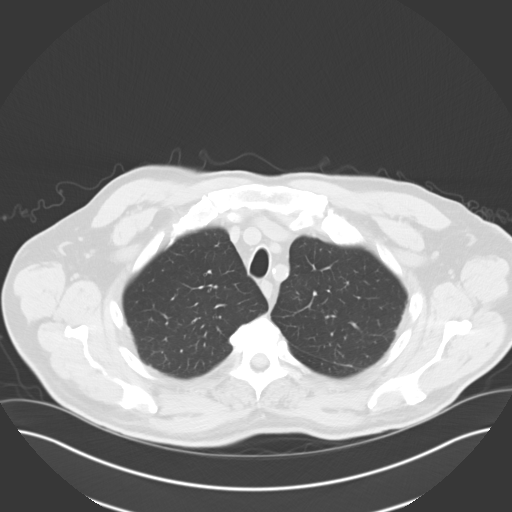
[im 58/68  lung]
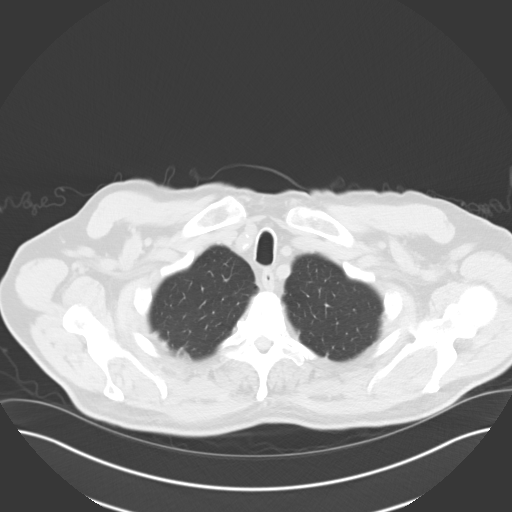
[im 63/68  lung]
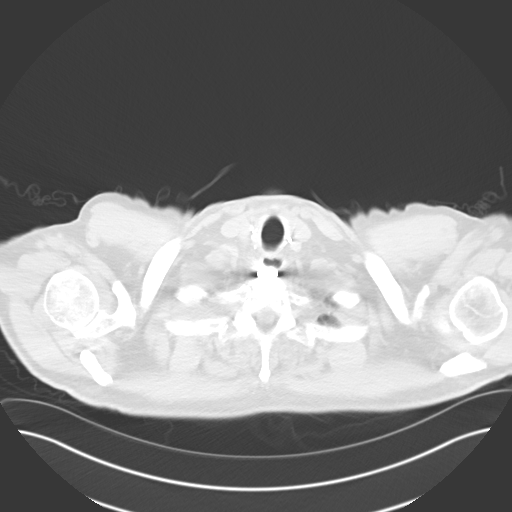

[Series 5: coronal · coronal · 0.66mm/px · 3 of 140 slices shown]
[im 28/140  lung]
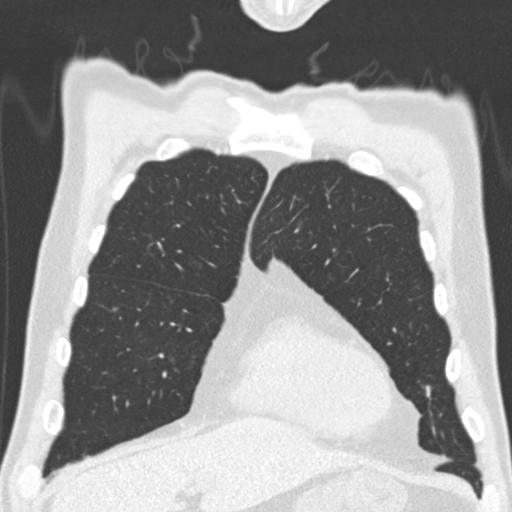
[im 56/140  lung]
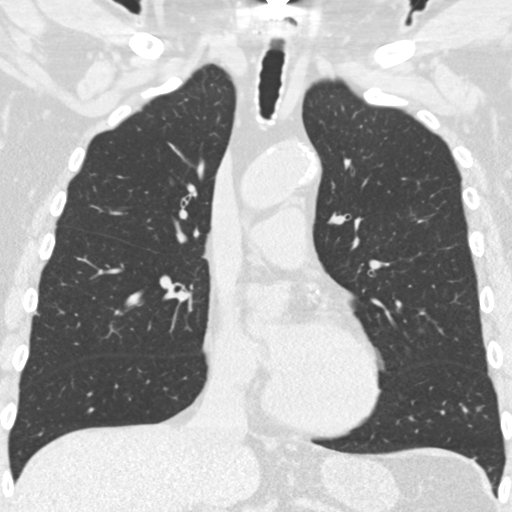
[im 84/140  lung]
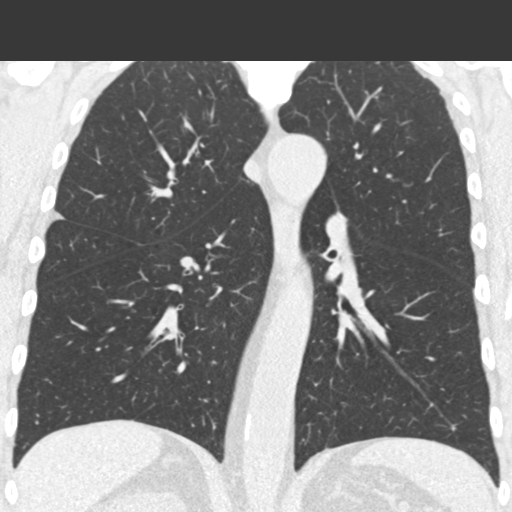

[15 of 36 positions shown; findings below may reference images not displayed]

FINDINGS: Mediastinum/Lymph Nodes: Heart size is normal. There is no
significant pericardial fluid, thickening or pericardial
calcification. There is atherosclerosis of the thoracic aorta, the
great vessels of the mediastinum and the coronary arteries,
including calcified atherosclerotic plaque in the left anterior
descending and right coronary arteries. No pathologically enlarged
mediastinal or hilar lymph nodes. Please note that accurate
exclusion of hilar adenopathy is limited on noncontrast CT scans.
Esophagus is unremarkable in appearance. No axillary
lymphadenopathy.

Lungs/Pleura: 5 mm subpleural nodule in the medial aspect of the
right lower lobe (image 46 of series 3) is unchanged. 3 mm right
lower lobe nodule (image 53 of series 3) was previously 4 mm on
prior study 02/10/2015. Likewise, a 3 mm left upper lobe nodule
(image 24 of series 3) was previously 4 mm on prior study
02/10/2015. A few other scattered smaller pulmonary nodules are also
noted, but appear unchanged in size, number and distribution to the
original scans from 08/12/2014. There is also a cluster of multiple
tiny 1-2 mm micronodules which have a tree-in-bud appearance in the
periphery of the left upper lobe, presumably benign (likely areas of
mucoid impaction within terminal bronchioles). No other larger more
suspicious appearing pulmonary nodules or masses are otherwise
noted. No acute consolidative airspace disease. No pleural
effusions.

Upper Abdomen: Incompletely visualized exophytic low-attenuation
lesion in the upper pole the right kidney measuring at least 5 cm in
diameter is incompletely characterized on today's noncontrast CT
examination, but is similar to prior studies, likely a cyst.

Musculoskeletal/Soft Tissues: Orthopedic fixation hardware in the
lower cervical spine incompletely visualized. There are no
aggressive appearing lytic or blastic lesions noted in the
visualized portions of the skeleton.
IMPRESSION: 1. All previously noted pulmonary nodules are either stable or
smaller than the prior examination, and at this time are considered
benign requiring no future imaging followup.
2. There is a cluster of multiple new tiny 1-2 mm nodules in the
periphery of the left upper lobe which have a tree-in-bud
appearance, most compatible with benign areas of mucoid impaction
within terminal bronchioles.
3. Atherosclerosis, including 2 vessel coronary artery disease.
Assessment for potential risk factor modification, dietary therapy
or pharmacologic therapy may be warranted, if clinically indicated.
4. Additional incidental findings, as above.

## 2017-09-28 ENCOUNTER — Telehealth: Payer: Self-pay | Admitting: Pulmonary Disease

## 2017-09-28 NOTE — Telephone Encounter (Signed)
PA renewal initiated 09/28/17. Reference # [id number]

## 2017-10-04 NOTE — Telephone Encounter (Signed)
Checked status of PA via [email or web address], states that PA request was archived d/t multiple attempts to initiate PA.   Called pt's insurance [employer] at [phone] to check status, states that as of 10/02/17 pt no longer has active [facility] coverage, just medicare coverage, so PA cannot be processed.    PA was initiated by [email or web address] so no pharmacy to call and follow up on.  Will close this encounter.

## 2017-10-09 ENCOUNTER — Telehealth: Payer: Self-pay | Admitting: Pulmonary Disease

## 2017-10-09 NOTE — Telephone Encounter (Signed)
Attempted to initiate PA via key provided but per [facility] it does not match an open ticket.    Called [facility]MM and spoke with Quita Skye, verified that key is incorrect.  [id number] is correct.  Ticked opened and sent to plan for [facility] PA.  Will hold in triage for determination.

## 2017-10-10 NOTE — Telephone Encounter (Signed)
Received a fax from [place] stating that the patient does not have active Medicare Part D coverage with them. Reviewed his chart, [name] is not on his list of current medications. Will discard this message.

## 2017-10-21 ENCOUNTER — Encounter: Payer: Self-pay | Admitting: Radiology

## 2017-11-24 ENCOUNTER — Ambulatory Visit (INDEPENDENT_AMBULATORY_CARE_PROVIDER_SITE_OTHER): Payer: Self-pay | Admitting: Urgent Care

## 2017-11-24 ENCOUNTER — Encounter: Payer: Self-pay | Admitting: Urgent Care

## 2017-11-24 VITALS — BP 130/80 | HR 70 | Temp 98.3°F | Resp 17 | Ht 72.5 in | Wt 239.0 lb

## 2017-11-24 DIAGNOSIS — I10 Essential (primary) hypertension: Secondary | ICD-10-CM

## 2017-11-24 DIAGNOSIS — Z024 Encounter for examination for driving license: Secondary | ICD-10-CM

## 2017-11-24 DIAGNOSIS — J441 Chronic obstructive pulmonary disease with (acute) exacerbation: Secondary | ICD-10-CM

## 2017-11-24 DIAGNOSIS — E039 Hypothyroidism, unspecified: Secondary | ICD-10-CM

## 2017-11-24 DIAGNOSIS — E785 Hyperlipidemia, unspecified: Secondary | ICD-10-CM

## 2017-11-24 DIAGNOSIS — I5022 Chronic systolic (congestive) heart failure: Secondary | ICD-10-CM

## 2017-11-24 NOTE — Patient Instructions (Signed)
     IF you received an x-ray today, you will receive an invoice from [facility]. Please contact [facility] at [phone] with questions or concerns regarding your invoice.   IF you received labwork today, you will receive an invoice from [facility]. Please contact [facility] at [phone] with questions or concerns regarding your invoice.   Our billing staff will not be able to assist you with questions regarding bills from these companies.  You will be contacted with the lab results as soon as they are available. The fastest way to get your results is to activate your My Chart account. Instructions are located on the last page of this paperwork. If you have not heard from us regarding the results in 2 weeks, please contact this office.     

## 2017-11-24 NOTE — Progress Notes (Signed)
   Commercial Driver Medical Examination   Arthur [name]. is a 77 y.o. male who presents today for a DOT physical exam. Has a history of COPD, last pulmonology follow up was 09/20/2017, was cleared for follow up in 6 months. Has history of heart failure, last echocardiogram was 03/2017, had stable EF, 45%. Heart cath from 09/02/2016 was negative for CAD, had LV dysfunction. HTN controlled with losartan, carvedilol. Has hypothyroidism, managed with levothyroxine. Denies dizziness, chest pain, shortness of breath, heart racing, palpitations, nausea, vomiting, abdominal pain, hematuria, lower leg swelling. Patient has quit smoking.  The following portions of the patient's history were reviewed and updated as appropriate: allergies, current medications, past family history, past medical history, past social history and past surgical history.  Objective:   BP 130/80   Pulse 70   Temp 98.3 F (36.8 C) (Oral)   Resp 17   Ht 6' 0.5" (1.842 m)   Wt 239 lb (108.4 kg)   SpO2 98%   BMI 31.97 kg/m   Vision/hearing:  Visual Acuity Screening   Right eye Left eye Both eyes  Without correction: 20/50 20/50 20/50   With correction:     Comments: Vision Screening Snellen Chart; OD 20/40; OS 20/40; OU 20/30.  Hearing Screening Comments: Peripheral Vision: Right eye 85 degrees. Left eye 85 degrees. The patient can distinguish the colors red, amber and green. The patient was able to hear a forced whisper from L=10 R=10 feet.  Patient can recognize and distinguish among traffic control signals and devices showing standard red, green, and amber colors.  Corrective lenses required: No  Monocular Vision?: No  Hearing aid requirement: No  Physical Exam  Constitutional: He is oriented to person, place, and time. He appears well-developed and well-nourished.  HENT:  TM's intact bilaterally, no effusions or erythema. Nasal turbinates pink and moist, nasal passages patent. No sinus  tenderness. Oropharynx clear, mucous membranes moist, dentition in good repair.  Eyes: Conjunctivae and EOM are normal. Pupils are equal, round, and reactive to light. Right eye exhibits no discharge. Left eye exhibits no discharge. No scleral icterus.  Neck: Normal range of motion. Neck supple. No thyromegaly present.  Cardiovascular: Normal rate, regular rhythm and intact distal pulses. Exam reveals no gallop and no friction rub.  No murmur heard. Pulmonary/Chest: No stridor. No respiratory distress. He has no wheezes. He has no rales.  Abdominal: Soft. Bowel sounds are normal. He exhibits no distension and no mass. There is no tenderness.  Musculoskeletal: Normal range of motion. He exhibits no edema or tenderness.  Lymphadenopathy:    He has no cervical adenopathy.  Neurological: He is alert and oriented to person, place, and time. He has normal reflexes.  Skin: Skin is warm and dry. No rash noted. No erythema. No pallor.  Well healed surgical scars from knee, thyroid surgeries.  Psychiatric: He has a normal mood and affect.   Labs: Comments: SPGR:1.030, PRO:NEG, bLOOD:NEG, GLU:NEG   Assessment:    Healthy male exam.  Meets standards, but periodic monitoring required due to HTN, COPD.  Driver qualified only for 1 year.    Plan:   Medical examiners certificate completed and printed. Return as needed.  Jaynee Eagles, PA-C [facility] [phone] 11/24/2017  11:09 AM

## 2017-12-30 ENCOUNTER — Other Ambulatory Visit: Payer: Self-pay | Admitting: Family Medicine

## 2018-02-19 ENCOUNTER — Encounter: Payer: Self-pay | Admitting: Family Medicine

## 2018-02-19 ENCOUNTER — Other Ambulatory Visit: Payer: Self-pay

## 2018-02-19 ENCOUNTER — Ambulatory Visit (INDEPENDENT_AMBULATORY_CARE_PROVIDER_SITE_OTHER): Payer: PPO | Admitting: Family Medicine

## 2018-02-19 VITALS — BP 110/60 | HR 58 | Temp 98.3°F | Resp 18 | Ht 72.0 in | Wt 234.8 lb

## 2018-02-19 DIAGNOSIS — M542 Cervicalgia: Secondary | ICD-10-CM | POA: Diagnosis not present

## 2018-02-19 DIAGNOSIS — R001 Bradycardia, unspecified: Secondary | ICD-10-CM

## 2018-02-19 DIAGNOSIS — R42 Dizziness and giddiness: Secondary | ICD-10-CM | POA: Diagnosis not present

## 2018-02-19 DIAGNOSIS — R202 Paresthesia of skin: Secondary | ICD-10-CM | POA: Diagnosis not present

## 2018-02-19 DIAGNOSIS — H9193 Unspecified hearing loss, bilateral: Secondary | ICD-10-CM | POA: Diagnosis not present

## 2018-02-19 DIAGNOSIS — R5383 Other fatigue: Secondary | ICD-10-CM

## 2018-02-19 LAB — POCT CBC
GRANULOCYTE PERCENT: 49.1 % (ref 37–80)
HCT, POC: 41.9 % — AB (ref 43.5–53.7)
HEMOGLOBIN: 13.7 g/dL — AB (ref 14.1–18.1)
Lymph, poc: 3.5 — AB (ref 0.6–3.4)
MCH: 29.3 pg (ref 27–31.2)
MCHC: 32.7 g/dL (ref 31.8–35.4)
MCV: 89.4 fL (ref 80–97)
MID (CBC): 1.1 — AB (ref 0–0.9)
MPV: 8.8 fL (ref 0–99.8)
PLATELET COUNT, POC: 225 10*3/uL (ref 142–424)
POC Granulocyte: 4.5 (ref 2–6.9)
POC LYMPH PERCENT: 38.3 %L (ref 10–50)
POC MID %: 12.6 %M — AB (ref 0–12)
RBC: 4.69 M/uL (ref 4.69–6.13)
RDW, POC: 13.4 %
WBC: 9.1 10*3/uL (ref 4.6–10.2)

## 2018-02-19 LAB — GLUCOSE, POCT (MANUAL RESULT ENTRY): POC GLUCOSE: 97 mg/dL (ref 70–99)
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
## 2018-02-19 NOTE — Progress Notes (Signed)
Orthostatics

## 2018-02-19 NOTE — Patient Instructions (Addendum)
Fatigue and dizziness may be due to a number of causes.  I will check some bloodwork. Make sure you drink plenty of fluids tonight, rest, recheck tomorrow so we can repeat your blood count as that was borderline low today.  If any worsening symptoms overnight, proceed to the emergency room as we discussed.   For hearing difficulty, use your hearing aids and if that is not helping your symptoms, I would like you to be evaluated by ear nose and throat as soon as possible.  Please follow-up tomorrow for recheck as planned, but if any worsening symptoms overnight proceed to the emergency room.   Tingling in hands may be related to neck issues. I would like to discuss that further at next visit. Return to the clinic or go to the nearest emergency room if any of your symptoms worsen or new symptoms occur.  Dizziness Dizziness is a common problem. It is a feeling of unsteadiness or light-headedness. You may feel like you are about to faint. Dizziness can lead to injury if you stumble or fall. Anyone can become dizzy, but dizziness is more common in older adults. This condition can be caused by a number of things, including medicines, dehydration, or illness. Follow these instructions at home: Eating and drinking  Drink enough fluid to keep your urine clear or pale yellow. This helps to keep you from becoming dehydrated. Try to drink more clear fluids, such as water.  Do not drink alcohol.  Limit your caffeine intake if told to do so by your health care provider. Check ingredients and nutrition facts to see if a food or beverage contains caffeine.  Limit your salt (sodium) intake if told to do so by your health care provider. Check ingredients and nutrition facts to see if a food or beverage contains sodium. Activity  Avoid making quick movements. ? Rise slowly from chairs and steady yourself until you feel okay. ? In the morning, first sit up on the side of the bed. When you feel okay, stand slowly  while you hold onto something until you know that your balance is fine.  If you need to stand in one place for a long time, move your legs often. Tighten and relax the muscles in your legs while you are standing.  Do not drive or use heavy machinery if you feel dizzy.  Avoid bending down if you feel dizzy. Place items in your home so that they are easy for you to reach without leaning over. Lifestyle  Do not use any products that contain nicotine or tobacco, such as cigarettes and e-cigarettes. If you need help quitting, ask your health care provider.  Try to reduce your stress level by using methods such as yoga or meditation. Talk with your health care provider if you need help to manage your stress. General instructions  Watch your dizziness for any changes.  Take over-the-counter and prescription medicines only as told by your health care provider. Talk with your health care provider if you think that your dizziness is caused by a medicine that you are taking.  Tell a friend or a family member that you are feeling dizzy. If he or she notices any changes in your behavior, have this person call your health care provider.  Keep all follow-up visits as told by your health care provider. This is important. Contact a health care provider if:  Your dizziness does not go away.  Your dizziness or light-headedness gets worse.  You feel nauseous.  You  have reduced hearing.  You have new symptoms.  You are unsteady on your feet or you feel like the room is spinning. Get help right away if:  You vomit or have diarrhea and are unable to eat or drink anything.  You have problems talking, walking, swallowing, or using your arms, hands, or legs.  You feel generally weak.  You are not thinking clearly or you have trouble forming sentences. It may take a friend or family member to notice this.  You have chest pain, abdominal pain, shortness of breath, or sweating.  Your vision  changes.  You have any bleeding.  You have a severe headache.  You have neck pain or a stiff neck.  You have a fever. These symptoms may represent a serious problem that is an emergency. Do not wait to see if the symptoms will go away. Get medical help right away. Call your local emergency services (911 in the [place] Do not drive yourself to the hospital. Summary  Dizziness is a feeling of unsteadiness or light-headedness. This condition can be caused by a number of things, including medicines, dehydration, or illness.  Anyone can become dizzy, but dizziness is more common in older adults.  Drink enough fluid to keep your urine clear or pale yellow. Do not drink alcohol.  Avoid making quick movements if you feel dizzy. Monitor your dizziness for any changes. This information is not intended to replace advice given to you by your health care provider. Make sure you discuss any questions you have with your health care provider. Document Released: 03/15/2001 Document Revised: 10/22/2016 Document Reviewed: 10/22/2016 Elsevier Interactive Patient Education  2018 [employer].   Fatigue Fatigue is feeling tired all of the time, a lack of energy, or a lack of motivation. Occasional or mild fatigue is often a normal response to activity or life in general. However, long-lasting (chronic) or extreme fatigue may indicate an underlying medical condition. Follow these instructions at home: Watch your fatigue for any changes. The following actions may help to lessen any discomfort you are feeling:  Talk to your health care provider about how much sleep you need each night. Try to get the required amount every night.  Take medicines only as directed by your health care provider.  Eat a healthy and nutritious diet. Ask your health care provider if you need help changing your diet.  Drink enough fluid to keep your urine clear or pale yellow.  Practice ways of relaxing, such as yoga, meditation,  massage therapy, or acupuncture.  Exercise regularly.  Change situations that cause you stress. Try to keep your work and personal routine reasonable.  Do not abuse illegal drugs.  Limit alcohol intake to no more than 1 drink per day for nonpregnant women and 2 drinks per day for men. One drink equals 12 ounces of beer, 5 ounces of wine, or 1 ounces of hard liquor.  Take a multivitamin, if directed by your health care provider.  Contact a health care provider if:  Your fatigue does not get better.  You have a fever.  You have unintentional weight loss or gain.  You have headaches.  You have difficulty: ? Falling asleep. ? Sleeping throughout the night.  You feel angry, guilty, anxious, or sad.  You are unable to have a bowel movement (constipation).  You skin is dry.  Your legs or another part of your body is swollen. Get help right away if:  You feel confused.  Your vision is blurry.  You feel faint or pass out.  You have a severe headache.  You have severe abdominal, pelvic, or back pain.  You have chest pain, shortness of breath, or an irregular or fast heartbeat.  You are unable to urinate or you urinate less than normal.  You develop abnormal bleeding, such as bleeding from the rectum, vagina, nose, lungs, or nipples.  You vomit blood.  You have thoughts about harming yourself or committing suicide.  You are worried that you might harm someone else. This information is not intended to replace advice given to you by your health care provider. Make sure you discuss any questions you have with your health care provider. Document Released: 07/17/2007 Document Revised: 02/25/2016 Document Reviewed: 01/21/2014 Elsevier Interactive Patient Education  2018 [employer].   IF you received an x-ray today, you will receive an invoice from [facility]. Please contact [facility] at [phone] with questions or concerns regarding your  invoice.   IF you received labwork today, you will receive an invoice from [facility]. Please contact [facility] at [phone] with questions or concerns regarding your invoice.   Our billing staff will not be able to assist you with questions regarding bills from these companies.  You will be contacted with the lab results as soon as they are available. The fastest way to get your results is to activate your My Chart account. Instructions are located on the last page of this paperwork. If you have not heard from [place] regarding the results in 2 weeks, please contact this office.

## 2018-02-20 ENCOUNTER — Other Ambulatory Visit: Payer: Self-pay | Admitting: Family Medicine

## 2018-02-20 ENCOUNTER — Encounter: Payer: Self-pay | Admitting: Family Medicine

## 2018-02-20 ENCOUNTER — Ambulatory Visit (INDEPENDENT_AMBULATORY_CARE_PROVIDER_SITE_OTHER): Payer: PPO | Admitting: Family Medicine

## 2018-02-20 VITALS — BP 122/56 | HR 62 | Temp 97.6°F | Ht 72.0 in | Wt 234.0 lb

## 2018-02-20 DIAGNOSIS — R5383 Other fatigue: Secondary | ICD-10-CM

## 2018-02-20 DIAGNOSIS — R42 Dizziness and giddiness: Secondary | ICD-10-CM | POA: Diagnosis not present

## 2018-02-20 DIAGNOSIS — I5022 Chronic systolic (congestive) heart failure: Secondary | ICD-10-CM | POA: Diagnosis not present

## 2018-02-20 LAB — BASIC METABOLIC PANEL
BUN/Creatinine Ratio: 20 (ref 10–24)
BUN: 26 mg/dL (ref 8–27)
CO2: 23 mmol/L (ref 20–29)
Calcium: 9.5 mg/dL (ref 8.6–10.2)
Chloride: 103 mmol/L (ref 96–106)
Creatinine, Ser: 1.29 mg/dL — ABNORMAL HIGH (ref 0.76–1.27)
GFR calc non Af Amer: 54 mL/min/{1.73_m2} — ABNORMAL LOW (ref >59)
GFR, EST AFRICAN AMERICAN: 62 mL/min/{1.73_m2} (ref >59)
GLUCOSE: 98 mg/dL (ref 65–99)
POTASSIUM: 4.3 mmol/L (ref 3.5–5.2)
Sodium: 142 mmol/L (ref 134–144)

## 2018-02-20 LAB — TSH: TSH: 1.2 u[IU]/mL (ref 0.450–4.500)

## 2018-02-20 MED ORDER — CARVEDILOL 3.125 MG PO TABS: 3.1250 mg | ORAL_TABLET | Freq: Two times a day (BID) | ORAL | 1 refills | Status: DC
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
## 2018-02-20 NOTE — Patient Instructions (Addendum)
If dizziness does not completely improve in next day or two, I would recommend other testing including repeat blood count, further heart evaluation or possible neuro imaging. Return to the clinic or go to the nearest emergency room if any of your symptoms worsen or new symptoms occur.   I decreased the carvedilol for now, but would like you to be seen by cardiologist in next 2 weeks.   Return to the clinic or go to the nearest emergency room if any of your symptoms worsen or new symptoms occur.  If you continue to have neck pain or return of tingling in the hands - follow up for possible xrays and options to treat those symptoms.   Dizziness Dizziness is a common problem. It is a feeling of unsteadiness or light-headedness. You may feel like you are about to faint. Dizziness can lead to injury if you stumble or fall. Anyone can become dizzy, but dizziness is more common in older adults. This condition can be caused by a number of things, including medicines, dehydration, or illness. Follow these instructions at home: Eating and drinking  Drink enough fluid to keep your urine clear or pale yellow. This helps to keep you from becoming dehydrated. Try to drink more clear fluids, such as water.  Do not drink alcohol.  Limit your caffeine intake if told to do so by your health care provider. Check ingredients and nutrition facts to see if a food or beverage contains caffeine.  Limit your salt (sodium) intake if told to do so by your health care provider. Check ingredients and nutrition facts to see if a food or beverage contains sodium. Activity  Avoid making quick movements. ? Rise slowly from chairs and steady yourself until you feel okay. ? In the morning, first sit up on the side of the bed. When you feel okay, stand slowly while you hold onto something until you know that your balance is fine.  If you need to stand in one place for a long time, move your legs often. Tighten and relax the  muscles in your legs while you are standing.  Do not drive or use heavy machinery if you feel dizzy.  Avoid bending down if you feel dizzy. Place items in your home so that they are easy for you to reach without leaning over. Lifestyle  Do not use any products that contain nicotine or tobacco, such as cigarettes and e-cigarettes. If you need help quitting, ask your health care provider.  Try to reduce your stress level by using methods such as yoga or meditation. Talk with your health care provider if you need help to manage your stress. General instructions  Watch your dizziness for any changes.  Take over-the-counter and prescription medicines only as told by your health care provider. Talk with your health care provider if you think that your dizziness is caused by a medicine that you are taking.  Tell a friend or a family member that you are feeling dizzy. If he or she notices any changes in your behavior, have this person call your health care provider.  Keep all follow-up visits as told by your health care provider. This is important. Contact a health care provider if:  Your dizziness does not go away.  Your dizziness or light-headedness gets worse.  You feel nauseous.  You have reduced hearing.  You have new symptoms.  You are unsteady on your feet or you feel like the room is spinning. Get help right away if:  You vomit or have diarrhea and are unable to eat or drink anything.  You have problems talking, walking, swallowing, or using your arms, hands, or legs.  You feel generally weak.  You are not thinking clearly or you have trouble forming sentences. It may take a friend or family member to notice this.  You have chest pain, abdominal pain, shortness of breath, or sweating.  Your vision changes.  You have any bleeding.  You have a severe headache.  You have neck pain or a stiff neck.  You have a fever. These symptoms may represent a serious problem that  is an emergency. Do not wait to see if the symptoms will go away. Get medical help right away. Call your local emergency services (911 in the [place] Do not drive yourself to the hospital. Summary  Dizziness is a feeling of unsteadiness or light-headedness. This condition can be caused by a number of things, including medicines, dehydration, or illness.  Anyone can become dizzy, but dizziness is more common in older adults.  Drink enough fluid to keep your urine clear or pale yellow. Do not drink alcohol.  Avoid making quick movements if you feel dizzy. Monitor your dizziness for any changes. This information is not intended to replace advice given to you by your health care provider. Make sure you discuss any questions you have with your health care provider. Document Released: 03/15/2001 Document Revised: 10/22/2016 Document Reviewed: 10/22/2016 Elsevier Interactive Patient Education  2018 [employer].  Fatigue Fatigue is feeling tired all of the time, a lack of energy, or a lack of motivation. Occasional or mild fatigue is often a normal response to activity or life in general. However, long-lasting (chronic) or extreme fatigue may indicate an underlying medical condition. Follow these instructions at home: Watch your fatigue for any changes. The following actions may help to lessen any discomfort you are feeling:  Talk to your health care provider about how much sleep you need each night. Try to get the required amount every night.  Take medicines only as directed by your health care provider.  Eat a healthy and nutritious diet. Ask your health care provider if you need help changing your diet.  Drink enough fluid to keep your urine clear or pale yellow.  Practice ways of relaxing, such as yoga, meditation, massage therapy, or acupuncture.  Exercise regularly.  Change situations that cause you stress. Try to keep your work and personal routine reasonable.  Do not abuse illegal  drugs.  Limit alcohol intake to no more than 1 drink per day for nonpregnant women and 2 drinks per day for men. One drink equals 12 ounces of beer, 5 ounces of wine, or 1 ounces of hard liquor.  Take a multivitamin, if directed by your health care provider.  Contact a health care provider if:  Your fatigue does not get better.  You have a fever.  You have unintentional weight loss or gain.  You have headaches.  You have difficulty: ? Falling asleep. ? Sleeping throughout the night.  You feel angry, guilty, anxious, or sad.  You are unable to have a bowel movement (constipation).  You skin is dry.  Your legs or another part of your body is swollen. Get help right away if:  You feel confused.  Your vision is blurry.  You feel faint or pass out.  You have a severe headache.  You have severe abdominal, pelvic, or back pain.  You have chest pain, shortness of breath, or  an irregular or fast heartbeat.  You are unable to urinate or you urinate less than normal.  You develop abnormal bleeding, such as bleeding from the rectum, vagina, nose, lungs, or nipples.  You vomit blood.  You have thoughts about harming yourself or committing suicide.  You are worried that you might harm someone else. This information is not intended to replace advice given to you by your health care provider. Make sure you discuss any questions you have with your health care provider. Document Released: 07/17/2007 Document Revised: 02/25/2016 Document Reviewed: 01/21/2014 Elsevier Interactive Patient Education  2018 [employer].   IF you received an x-ray today, you will receive an invoice from [facility]. Please contact [facility] at [phone] with questions or concerns regarding your invoice.   IF you received labwork today, you will receive an invoice from [place]. Please contact [facility] at [phone] with questions or concerns regarding your invoice.   Our  billing staff will not be able to assist you with questions regarding bills from these companies.  You will be contacted with the lab results as soon as they are available. The fastest way to get your results is to activate your My Chart account. Instructions are located on the last page of this paperwork. If you have not heard from [place] regarding the results in 2 weeks, please contact this office.

## 2018-02-21 ENCOUNTER — Telehealth: Payer: Self-pay | Admitting: Cardiovascular Disease

## 2018-02-21 NOTE — Telephone Encounter (Signed)
Called patient and left detailed message of an appointment for next week with Ermalinda Barrios PA. Asked patient to call back if date and time does not work for him.

## 2018-02-21 NOTE — Telephone Encounter (Signed)
New Message:      Pt is calling and per Dr. Nyoka Cowden his pcp wanted him to be seen in the next two weeks due to some medication changes.

## 2018-02-27 ENCOUNTER — Ambulatory Visit: Payer: PPO | Admitting: Physician Assistant

## 2018-02-27 ENCOUNTER — Encounter: Payer: Self-pay | Admitting: Physician Assistant

## 2018-02-27 VITALS — BP 122/72 | HR 65 | Ht 72.0 in | Wt 229.0 lb

## 2018-02-27 DIAGNOSIS — R42 Dizziness and giddiness: Secondary | ICD-10-CM | POA: Diagnosis not present

## 2018-02-27 DIAGNOSIS — I10 Essential (primary) hypertension: Secondary | ICD-10-CM | POA: Diagnosis not present

## 2018-02-27 DIAGNOSIS — E7800 Pure hypercholesterolemia, unspecified: Secondary | ICD-10-CM | POA: Diagnosis not present

## 2018-02-27 DIAGNOSIS — I428 Other cardiomyopathies: Secondary | ICD-10-CM

## 2018-02-27 DIAGNOSIS — I447 Left bundle-branch block, unspecified: Secondary | ICD-10-CM | POA: Diagnosis not present

## 2018-02-27 DIAGNOSIS — F17200 Nicotine dependence, unspecified, uncomplicated: Secondary | ICD-10-CM

## 2018-02-27 NOTE — Progress Notes (Signed)
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 on your cardiac medications before your next appointment, please call your pharmacy.      Signed, Ermalinda Barrios, PA-C  02/27/2018 8:25 AM    [facility] [place], [place]  [address] Phone: [phone]; Fax: [phone]

## 2018-02-27 NOTE — Patient Instructions (Signed)
Medication Instructions:  Your physician recommends that you continue on your current medications as directed. Please refer to the Current Medication list given to you today.   Labwork: -None  Testing/Procedures: Keep appointment for echo  Follow-Up: Keep follow up appointment with [name].  Any Other Special Instructions Will Be Listed Below (If Applicable).  Two Gram Sodium Diet 2000 mg  What is Sodium? Sodium is a mineral found naturally in many foods. The most significant source of sodium in the diet is table salt, which is about 40% sodium.  Processed, convenience, and preserved foods also contain a large amount of sodium.  The body needs only 500 mg of sodium daily to function,  A normal diet provides more than enough sodium even if you do not use salt.  Why Limit Sodium? A build up of sodium in the body can cause thirst, increased blood pressure, shortness of breath, and water retention.  Decreasing sodium in the diet can reduce edema and risk of heart attack or stroke associated with high blood pressure.  Keep in mind that there are many other factors involved in these health problems.  Heredity, obesity, lack of exercise, cigarette smoking, stress and what you eat all play a role.  General Guidelines:  Do not add salt at the table or in cooking.  One teaspoon of salt contains over 2 grams of sodium.  Read food labels  Avoid processed and convenience foods  Ask your dietitian before eating any foods not dicussed in the menu planning guidelines  Consult your physician if you wish to use a salt substitute or a sodium containing medication such as antacids.  Limit milk and milk products to 16 oz (2 cups) per day.  Shopping Hints:  READ LABELS!! "Dietetic" does not necessarily mean low sodium.  Salt and other sodium ingredients are often added to foods during processing.   Menu Planning Guidelines Food Group Choose More Often Avoid  Beverages (see also the milk group  All fruit juices, low-sodium, salt-free vegetables juices, low-sodium carbonated beverages Regular vegetable or tomato juices, commercially softened water used for drinking or cooking  Breads and Cereals Enriched white, wheat, rye and pumpernickel bread, hard rolls and dinner rolls; muffins, cornbread and waffles; most dry cereals, cooked cereal without added salt; unsalted crackers and breadsticks; low sodium or homemade bread crumbs Bread, rolls and crackers with salted tops; quick breads; instant hot cereals; pancakes; commercial bread stuffing; self-rising flower and biscuit mixes; regular bread crumbs or cracker crumbs  Desserts and Sweets Desserts and sweets mad with mild should be within allowance Instant pudding mixes and cake mixes  Fats Butter or margarine; vegetable oils; unsalted salad dressings, regular salad dressings limited to 1 Tbs; light, sour and heavy cream Regular salad dressings containing bacon fat, bacon bits, and salt pork; snack dips made with instant soup mixes or processed cheese; salted nuts  Fruits Most fresh, frozen and canned fruits Fruits processed with salt or sodium-containing ingredient (some dried fruits are processed with sodium sulfites        Vegetables Fresh, frozen vegetables and low- sodium canned vegetables Regular canned vegetables, sauerkraut, pickled vegetables, and others prepared in brine; frozen vegetables in sauces; vegetables seasoned with ham, bacon or salt pork  Condiments, Sauces, Miscellaneous  Salt substitute with physician's approval; pepper, herbs, spices; vinegar, lemon or lime juice; hot pepper sauce; garlic powder, onion powder, low sodium soy sauce (1 Tbs.); low sodium condiments (ketchup, chili sauce, mustard) in limited amounts (1 tsp.) fresh ground horseradish;  unsalted tortilla chips, pretzels, potato chips, popcorn, salsa (1/4 cup) Any seasoning made with salt including garlic salt, celery salt, onion salt, and seasoned salt; sea salt,  rock salt, kosher salt; meat tenderizers; monosodium glutamate; mustard, regular soy sauce, barbecue, sauce, chili sauce, teriyaki sauce, steak sauce, Worcestershire sauce, and most flavored vinegars; canned gravy and mixes; regular condiments; salted snack foods, olives, picles, relish, horseradish sauce, catsup   Food preparation: Try these seasonings Meats:    Pork Sage, onion Serve with applesauce  Chicken Poultry seasoning, thyme, parsley Serve with cranberry sauce  Lamb Curry powder, rosemary, garlic, thyme Serve with mint sauce or jelly  Veal Marjoram, basil Serve with current jelly, cranberry sauce  Beef Pepper, bay leaf Serve with dry mustard, unsalted chive butter  Fish [place] leaf, dill Serve with unsalted lemon butter, unsalted parsley butter  Vegetables:    Asparagus Lemon juice   Broccoli Lemon juice   Carrots Mustard dressing parsley, mint, nutmeg, glazed with unsalted butter and sugar   Green beans Marjoram, lemon juice, nutmeg,dill seed   Tomatoes Basil, marjoram, onion   Spice /blend for [employer]" 4 tsp ground thyme 1 tsp ground sage 3 tsp ground rosemary 4 tsp ground marjoram   Test your knowledge 1. A product that says "Salt Free" may still contain sodium. True or False 2. Garlic Powder and Hot Pepper Sauce an be used as alternative seasonings.True or False 3. Processed foods have more sodium than fresh foods.  True or False 4. Canned Vegetables have less sodium than froze True or False  WAYS TO DECREASE YOUR SODIUM INTAKE 1. Avoid the use of added salt in cooking and at the table.  Table salt (and other prepared seasonings which contain salt) is probably one of the greatest sources of sodium in the diet.  Unsalted foods can gain flavor from the sweet, sour, and butter taste sensations of herbs and spices.  Instead of using salt for seasoning, try the following seasonings with the foods listed.  Remember: how you use them to enhance natural food flavors is limited only  by your creativity... Allspice-Meat, fish, eggs, fruit, peas, red and yellow vegetables Almond Extract-Fruit baked goods Anise Seed-Sweet breads, fruit, carrots, beets, cottage cheese, cookies (tastes like licorice) Basil-Meat, fish, eggs, vegetables, rice, vegetables salads, soups, sauces Bay Leaf-Meat, fish, stews, poultry Burnet-Salad, vegetables (cucumber-like flavor) Caraway Seed-Bread, cookies, cottage cheese, meat, vegetables, cheese, rice Cardamon-Baked goods, fruit, soups Celery Powder or seed-Salads, salad dressings, sauces, meatloaf, soup, bread.Do not use  celery salt Chervil-Meats, salads, fish, eggs, vegetables, cottage cheese (parsley-like flavor) Chili Power-Meatloaf, chicken cheese, corn, eggplant, egg dishes Chives-Salads cottage cheese, egg dishes, soups, vegetables, sauces Cilantro-Salsa, casseroles Cinnamon-Baked goods, fruit, pork, lamb, chicken, carrots Cloves-Fruit, baked goods, fish, pot roast, green beans, beets, carrots Coriander-Pastry, cookies, meat, salads, cheese (lemon-orange flavor) Cumin-Meatloaf, fish,cheese, eggs, cabbage,fruit pie (caraway flavor) [employer], fruit, eggs, fish, poultry, cottage cheese, vegetables Dill Seed-Meat, cottage cheese, poultry, vegetables, fish, salads, bread Fennel Seed-Bread, cookies, apples, pork, eggs, fish, beets, cabbage, cheese, Licorice-like flavor Garlic-(buds or powder) Salads, meat, poultry, fish, bread, butter, vegetables, potatoes.Do not  use garlic salt Ginger-Fruit, vegetables, baked goods, meat, fish, poultry Horseradish Root-Meet, vegetables, butter Lemon Juice or Extract-Vegetables, fruit, tea, baked goods, fish salads Mace-Baked goods fruit, vegetables, fish, poultry (taste like nutmeg) Maple Extract-Syrups Marjoram-Meat, chicken, fish, vegetables, breads, green salads (taste like Sage) Mint-Tea, lamb, sherbet, vegetables, desserts, carrots, cabbage Mustard, Dry or Seed-Cheese, eggs, meats,  vegetables, poultry Nutmeg-Baked goods, fruit, chicken, eggs, vegetables, desserts  Onion Powder-Meat, fish, poultry, vegetables, cheese, eggs, bread, rice salads (Do not use   Onion salt) Orange Extract-Desserts, baked goods Oregano-Pasta, eggs, cheese, onions, pork, lamb, fish, chicken, vegetables, green salads Paprika-Meat, fish, poultry, eggs, cheese, vegetables Parsley Flakes-Butter, vegetables, meat fish, poultry, eggs, bread, salads (certain forms may   Contain sodium Pepper-Meat fish, poultry, vegetables, eggs Peppermint Extract-Desserts, baked goods Poppy Seed-Eggs, bread, cheese, fruit dressings, baked goods, noodles, vegetables, cottage  [employer], poultry, meat, fish, cauliflower, turnips,eggs bread Saffron-Rice, bread, veal, chicken, fish, eggs Sage-Meat, fish, poultry, onions, eggplant, tomateos, pork, stews Savory-Eggs, salads, poultry, meat, rice, vegetables, soups, pork Tarragon-Meat, poultry, fish, eggs, butter, vegetables (licorice-like flavor)  Thyme-Meat, poultry, fish, eggs, vegetables, (clover-like flavor), sauces, soups Tumeric-Salads, butter, eggs, fish, rice, vegetables (saffron-like flavor) Vanilla Extract-Baked goods, candy Vinegar-Salads, vegetables, meat marinades Walnut Extract-baked goods, candy  2. Choose your Foods Wisely   The following is a list of foods to avoid which are high in sodium:  Meats-Avoid all smoked, canned, salt cured, dried and kosher meat and fish as well as Anchovies   [employer] meats:Bologna, Liverwurst, Pastrami Canned meat or fish  Marinated herring Caviar    Pepperoni Corned Beef   Pizza Dried chipped beef  Salami Frozen breaded fish or meat Salt pork Frankfurters or hot dogs  Sardines Gefilte fish   Sausage Ham (boiled ham, Proscuitto Smoked butt    spiced ham)   Spam      TV Dinners Vegetables Canned vegetables (Regular) Relish Canned  mushrooms  Sauerkraut Olives    Tomato juice Pickles  Bakery and Dessert Products Canned puddings  Cream pies Cheesecake   Decorated cakes Cookies  Beverages/Juices Tomato juice, regular  Gatorade   V-8 vegetable juice, regular  Breads and Cereals Biscuit mixes   Salted potato chips, corn chips, pretzels Bread stuffing mixes  Salted crackers and rolls Pancake and waffle mixes Self-rising flour  Seasonings Accent    Meat sauces Barbecue sauce  Meat tenderizer Catsup    Monosodium glutamate (MSG) Celery salt   Onion salt Chili sauce   Prepared mustard Garlic salt   Salt, seasoned salt, sea salt Gravy mixes   Soy sauce Horseradish   Steak sauce Ketchup   Tartar sauce Lite salt    Teriyaki sauce Marinade mixes   Worcestershire sauce  Others Baking powder   Cocoa and cocoa mixes Baking soda   Commercial casserole mixes Candy-caramels, chocolate  Dehydrated soups    Bars, fudge,nougats  Instant rice and pasta mixes Canned broth or soup  Maraschino cherries Cheese, aged and processed cheese and cheese spreads  Learning Assessment Quiz  Indicated T (for True) or F (for False) for each of the following statements:  1. _____ Fresh fruits and vegetables and unprocessed grains are generally low in sodium 2. _____ Water may contain a considerable amount of sodium, depending on the source 3. _____ You can always tell if a food is high in sodium by tasting it 4. _____ Certain laxatives my be high in sodium and should be avoided unless prescribed   by a physician or pharmacist 5. _____ Salt substitutes may be used freely by anyone on a sodium restricted diet 6. _____ Sodium is present in table salt, food additives and as a natural component of   most foods 7. _____ Table salt is approximately 90% sodium 8. _____ Limiting sodium intake may help prevent excess fluid accumulation in the body 9. _____ On a sodium-restricted diet, seasonings such as  bouillon soy sauce, and    cooking  wine should be used in place of table salt 10. _____ On an ingredient list, a product which lists monosodium glutamate as the first   ingredient is an appropriate food to include on a low sodium diet  Circle the best answer(s) to the following statements (Hint: there may be more than one correct answer)  11. On a low-sodium diet, some acceptable snack items are:    A. Olives  F. Bean dip   K. Grapefruit juice    B. Salted Pretzels G. Commercial Popcorn   L. Canned peaches    C. Carrot Sticks  H. Bouillon   M. Unsalted nuts   D. [place] fries  I. Peanut butter crackers N. Salami   E. Sweet pickles J. Tomato Juice   O. Pizza  12.  Seasonings that may be used freely on a reduced - sodium diet include   A. Lemon wedges F.Monosodium glutamate K. Celery seed    B.Soysauce   G. Pepper   L. Mustard powder   C. Sea salt  H. Cooking wine  M. Onion flakes   D. Vinegar  E. Prepared horseradish N. Salsa   E. Sage   J. Worcestershire sauce  O. Chutney    If you need a refill on your cardiac medications before your next appointment, please call your pharmacy.

## 2018-03-19 ENCOUNTER — Encounter: Payer: Self-pay | Admitting: Pulmonary Disease

## 2018-03-19 ENCOUNTER — Ambulatory Visit: Payer: Medicare Other | Admitting: Pulmonary Disease

## 2018-03-19 DIAGNOSIS — F17200 Nicotine dependence, unspecified, uncomplicated: Secondary | ICD-10-CM | POA: Diagnosis not present

## 2018-03-19 DIAGNOSIS — J432 Centrilobular emphysema: Secondary | ICD-10-CM

## 2018-03-19 MED ORDER — STIOLTO RESPIMAT 2.5-2.5 MCG/ACT IN AERS: 2.0000 | INHALATION_SPRAY | Freq: Every day | RESPIRATORY_TRACT | 5 refills | Status: DC

## 2018-03-19 NOTE — Progress Notes (Signed)
   Subjective:    Patient ID: Arthur [name]., male    DOB: April 21, 1941, 77 y.o.   MRN: [id number]  HPI  77 yo male,  smoker , followed for COPD and multiple pulmonary nodules Has CHF (EF 35 % )   He continues to smoke about 1 cigarette a month. Has done well otherwise, unremarkable winter. Lung function seems to have stabilized on stiolto He does have albuterol which he only uses on an as-needed basis.  He underwent cholecystectomy in 2018, hospitalized for 6 days, uneventful from a respiratory standpoint    Significant tests/ events reviewed  2D Echo 05/2016 Moderate to severe global reduction in LV function; mild LVH; grade 1 diastolic dysfunction with elevated LV filling pressure; mild biatrial enlargement; trace AI, MR and TR. EF 30-35%  Spirometry 2016 >FEV1 66% , ratio 63.   CT chest 09/2015-nodule stable or decreased compared to 02/2015  Review of Systems Patient denies significant dyspnea,cough, hemoptysis,  chest pain, palpitations, pedal edema, orthopnea, paroxysmal nocturnal dyspnea, lightheadedness, nausea, vomiting, abdominal or  leg pains      Objective:   Physical Exam  Gen. Pleasant, obese, in no distress ENT - no lesions, no post nasal drip Neck: No JVD, no thyromegaly, no carotid bruits Lungs: no use of accessory muscles, no dullness to percussion, decreased without rales or rhonchi  Cardiovascular: Rhythm regular, heart sounds  normal, no murmurs or gallops, no peripheral edema Musculoskeletal: No deformities, no cyanosis or clubbing , no tremors        Assessment & Plan:

## 2018-03-19 NOTE — Assessment & Plan Note (Signed)
Complete cessation advised

## 2018-03-19 NOTE — Patient Instructions (Signed)
Refills on stiolto

## 2018-03-19 NOTE — Assessment & Plan Note (Signed)
Refills on stiolto Use albuterol prn

## 2018-03-19 NOTE — Addendum Note (Signed)
Addended by: Valerie Salts on: 03/19/2018 09:22 AM   Modules accepted: Orders

## 2018-03-20 ENCOUNTER — Ambulatory Visit: Payer: Medicare Other | Admitting: Family Medicine

## 2018-03-22 ENCOUNTER — Other Ambulatory Visit: Payer: Self-pay

## 2018-03-22 ENCOUNTER — Encounter: Payer: Self-pay | Admitting: Family Medicine

## 2018-03-22 ENCOUNTER — Encounter: Payer: Self-pay | Admitting: Internal Medicine

## 2018-03-22 ENCOUNTER — Ambulatory Visit (INDEPENDENT_AMBULATORY_CARE_PROVIDER_SITE_OTHER): Payer: PPO | Admitting: Family Medicine

## 2018-03-22 VITALS — BP 132/60 | HR 58 | Temp 98.1°F | Ht 72.0 in | Wt 233.0 lb

## 2018-03-22 DIAGNOSIS — Z0000 Encounter for general adult medical examination without abnormal findings: Secondary | ICD-10-CM | POA: Diagnosis not present

## 2018-03-22 DIAGNOSIS — Z23 Encounter for immunization: Secondary | ICD-10-CM | POA: Diagnosis not present

## 2018-03-22 DIAGNOSIS — I10 Essential (primary) hypertension: Secondary | ICD-10-CM | POA: Diagnosis not present

## 2018-03-22 DIAGNOSIS — E039 Hypothyroidism, unspecified: Secondary | ICD-10-CM

## 2018-03-22 DIAGNOSIS — E785 Hyperlipidemia, unspecified: Secondary | ICD-10-CM

## 2018-03-22 DIAGNOSIS — Z8546 Personal history of malignant neoplasm of prostate: Secondary | ICD-10-CM

## 2018-03-22 DIAGNOSIS — Z125 Encounter for screening for malignant neoplasm of prostate: Secondary | ICD-10-CM

## 2018-03-22 MED ORDER — CARVEDILOL 3.125 MG PO TABS: 3.1250 mg | ORAL_TABLET | Freq: Two times a day (BID) | ORAL | 1 refills | Status: DC

## 2018-03-22 MED ORDER — ATORVASTATIN CALCIUM 10 MG PO TABS: ORAL_TABLET | ORAL | 1 refills | Status: DC

## 2018-03-22 MED ORDER — LOSARTAN POTASSIUM 50 MG PO TABS: 50.0000 mg | ORAL_TABLET | Freq: Every day | ORAL | 1 refills | Status: DC

## 2018-03-22 MED ORDER — ZOSTER VAC RECOMB ADJUVANTED 50 MCG/0.5ML IM SUSR: 0.5000 mL | Freq: Once | INTRAMUSCULAR | 1 refills | Status: AC

## 2018-03-22 MED ORDER — LEVOTHYROXINE SODIUM 200 MCG PO TABS: ORAL_TABLET | ORAL | 1 refills | Status: DC
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
## 2018-03-22 NOTE — Patient Instructions (Addendum)
Please call Dr. Carlean Purl (Address: [address], [place], [place] [address],  Phone: [phone])  to schedule colonoscopy as that appears to be overdue, and can discuss heartburn with him as well. Let me know if a referral is needed. Ok to try Prilosec over the counter for now if needed. Return to the clinic or go to the nearest emergency room if any of your symptoms worsen or new symptoms occur.  Let me know if I can help with options for hearing aids.  Please schedule appointment with eye doctor as soon as possible. Let me know if you need names.   Memory testing was borderline today. I would like to recheck that test in next 1 year, but if any confusion, or changes in memory sooner - please return to discuss further.    Preventive Care 76 Years and Older, Male Preventive care refers to lifestyle choices and visits with your health care provider that can promote health and wellness. What does preventive care include?  A yearly physical exam. This is also called an annual well check.  Dental exams once or twice a year.  Routine eye exams. Ask your health care provider how often you should have your eyes checked.  Personal lifestyle choices, including: ? Daily care of your teeth and gums. ? Regular physical activity. ? Eating a healthy diet. ? Avoiding tobacco and drug use. ? Limiting alcohol use. ? Practicing safe sex. ? Taking low doses of aspirin every day. ? Taking vitamin and mineral supplements as recommended by your health care provider. What happens during an annual well check? The services and screenings done by your health care provider during your annual well check will depend on your age, overall health, lifestyle risk factors, and family history of disease. Counseling Your health care provider may ask you questions about your:  Alcohol use.  Tobacco use.  Drug use.  Emotional well-being.  Home and relationship well-being.  Sexual activity.  Eating  habits.  History of falls.  Memory and ability to understand (cognition).  Work and work Statistician.  Screening You may have the following tests or measurements:  Height, weight, and BMI.  Blood pressure.  Lipid and cholesterol levels. These may be checked every 5 years, or more frequently if you are over 65 years old.  Skin check.  Lung cancer screening. You may have this screening every year starting at age 74 if you have a 30-pack-year history of smoking and currently smoke or have quit within the past 15 years.  Fecal occult blood test (FOBT) of the stool. You may have this test every year starting at age 52.  Flexible sigmoidoscopy or colonoscopy. You may have a sigmoidoscopy every 5 years or a colonoscopy every 10 years starting at age 69.  Prostate cancer screening. Recommendations will vary depending on your family history and other risks.  Hepatitis C blood test.  Hepatitis B blood test.  Sexually transmitted disease (STD) testing.  Diabetes screening. This is done by checking your blood sugar (glucose) after you have not eaten for a while (fasting). You may have this done every 1-3 years.  Abdominal aortic aneurysm (AAA) screening. You may need this if you are a current or former smoker.  Osteoporosis. You may be screened starting at age 66 if you are at high risk.  Talk with your health care provider about your test results, treatment options, and if necessary, the need for more tests. Vaccines Your health care provider may recommend certain vaccines, such  as:  Influenza vaccine. This is recommended every year.  Tetanus, diphtheria, and acellular pertussis (Tdap, Td) vaccine. You may need a Td booster every 10 years.  Varicella vaccine. You may need this if you have not been vaccinated.  Zoster vaccine. You may need this after age 20.  Measles, mumps, and rubella (MMR) vaccine. You may need at least one dose of MMR if you were born in 1957 or later. You  may also need a second dose.  Pneumococcal 13-valent conjugate (PCV13) vaccine. One dose is recommended after age 82.  Pneumococcal polysaccharide (PPSV23) vaccine. One dose is recommended after age 52.  Meningococcal vaccine. You may need this if you have certain conditions.  Hepatitis A vaccine. You may need this if you have certain conditions or if you travel or work in places where you may be exposed to hepatitis A.  Hepatitis B vaccine. You may need this if you have certain conditions or if you travel or work in places where you may be exposed to hepatitis B.  Haemophilus influenzae type b (Hib) vaccine. You may need this if you have certain risk factors.  Talk to your health care provider about which screenings and vaccines you need and how often you need them. This information is not intended to replace advice given to you by your health care provider. Make sure you discuss any questions you have with your health care provider. Document Released: 10/16/2015 Document Revised: 06/08/2016 Document Reviewed: 07/21/2015 Elsevier Interactive Patient Education  2018 [employer].     IF you received an x-ray today, you will receive an invoice from [facility]. Please contact [facility] at [phone] with questions or concerns regarding your invoice.   IF you received labwork today, you will receive an invoice from [facility]. Please contact [facility] at [phone] with questions or concerns regarding your invoice.   Our billing staff will not be able to assist you with questions regarding bills from these companies.  You will be contacted with the lab results as soon as they are available. The fastest way to get your results is to activate your My Chart account. Instructions are located on the last page of this paperwork. If you have not heard from [place] regarding the results in 2 weeks, please contact this office.

## 2018-03-23 LAB — COMPREHENSIVE METABOLIC PANEL
A/G RATIO: 1.6 (ref 1.2–2.2)
ALBUMIN: 4.2 g/dL (ref 3.5–4.8)
ALT: 16 IU/L (ref 0–44)
AST: 15 IU/L (ref 0–40)
Alkaline Phosphatase: 104 IU/L (ref 39–117)
BILIRUBIN TOTAL: 0.6 mg/dL (ref 0.0–1.2)
BUN / CREAT RATIO: 13 (ref 10–24)
BUN: 13 mg/dL (ref 8–27)
CHLORIDE: 104 mmol/L (ref 96–106)
CO2: 24 mmol/L (ref 20–29)
Calcium: 9.8 mg/dL (ref 8.6–10.2)
Creatinine, Ser: 0.97 mg/dL (ref 0.76–1.27)
GFR calc non Af Amer: 76 mL/min/{1.73_m2} (ref >59)
GFR, EST AFRICAN AMERICAN: 88 mL/min/{1.73_m2} (ref >59)
Globulin, Total: 2.6 g/dL (ref 1.5–4.5)
Glucose: 92 mg/dL (ref 65–99)
Potassium: 4.7 mmol/L (ref 3.5–5.2)
SODIUM: 143 mmol/L (ref 134–144)
TOTAL PROTEIN: 6.8 g/dL (ref 6.0–8.5)

## 2018-03-23 LAB — TSH: TSH: 1.4 u[IU]/mL (ref 0.450–4.500)

## 2018-03-23 LAB — LIPID PANEL
CHOLESTEROL TOTAL: 128 mg/dL (ref 100–199)
Chol/HDL Ratio: 3.9 ratio (ref 0.0–5.0)
HDL: 33 mg/dL — ABNORMAL LOW (ref >39)
LDL Calculated: 69 mg/dL (ref 0–99)
Triglycerides: 131 mg/dL (ref 0–149)
VLDL CHOLESTEROL CAL: 26 mg/dL (ref 5–40)

## 2018-03-23 LAB — PSA: Prostate Specific Ag, Serum: <0.1 ng/mL (ref 0.0–4.0)

## 2018-03-26 ENCOUNTER — Other Ambulatory Visit: Payer: Self-pay

## 2018-03-26 ENCOUNTER — Ambulatory Visit (AMBULATORY_SURGERY_CENTER): Payer: Self-pay | Admitting: *Deleted

## 2018-03-26 VITALS — Ht 72.0 in | Wt 234.4 lb

## 2018-03-26 DIAGNOSIS — Z800 Family history of malignant neoplasm of digestive organs: Secondary | ICD-10-CM

## 2018-03-26 DIAGNOSIS — Z86010 Personal history of colonic polyps: Secondary | ICD-10-CM

## 2018-03-26 NOTE — Progress Notes (Signed)
Denies allergies to eggs or soy products. Denies complications with sedation or anesthesia. Denies O2 use. Denies use of diet or weight loss medications.  Emmi instructions not given for colonoscopy, pt does not have computer

## 2018-04-04 ENCOUNTER — Ambulatory Visit (AMBULATORY_SURGERY_CENTER): Payer: PPO | Admitting: Internal Medicine

## 2018-04-04 ENCOUNTER — Other Ambulatory Visit: Payer: Self-pay

## 2018-04-04 ENCOUNTER — Encounter: Payer: Self-pay | Admitting: Internal Medicine

## 2018-04-04 VITALS — BP 133/92 | HR 59 | Temp 98.7°F | Resp 10 | Ht 72.0 in | Wt 233.0 lb

## 2018-04-04 DIAGNOSIS — D122 Benign neoplasm of ascending colon: Secondary | ICD-10-CM

## 2018-04-04 DIAGNOSIS — D123 Benign neoplasm of transverse colon: Secondary | ICD-10-CM

## 2018-04-04 DIAGNOSIS — R1310 Dysphagia, unspecified: Secondary | ICD-10-CM

## 2018-04-04 DIAGNOSIS — Z86010 Personal history of colonic polyps: Secondary | ICD-10-CM | POA: Diagnosis not present

## 2018-04-04 DIAGNOSIS — R1319 Other dysphagia: Secondary | ICD-10-CM

## 2018-04-04 DIAGNOSIS — Z800 Family history of malignant neoplasm of digestive organs: Secondary | ICD-10-CM | POA: Diagnosis not present

## 2018-04-04 DIAGNOSIS — Z1211 Encounter for screening for malignant neoplasm of colon: Secondary | ICD-10-CM | POA: Diagnosis not present

## 2018-04-04 MED ORDER — PANTOPRAZOLE SODIUM 40 MG PO TBEC: 40.0000 mg | DELAYED_RELEASE_TABLET | Freq: Every day | ORAL | 3 refills | Status: DC

## 2018-04-04 MED ORDER — SODIUM CHLORIDE 0.9 % IV SOLN: 500.0000 mL | Freq: Once | INTRAVENOUS | Status: DC

## 2018-04-04 NOTE — Patient Instructions (Addendum)
I found and removed 3 tiny polyps. I will let you know pathology results and when to have another routine colonoscopy by mail and/or My Chart. Probably don't need to repeat one now that you are 76.  I appreciate the opportunity to care for you. Gatha Mayer, MD, [facility]  *handout on polyps given*  YOU HAD AN ENDOSCOPIC PROCEDURE TODAY AT [facility]:   Refer to the procedure report that was given to you for any specific questions about what was found during the examination.  If the procedure report does not answer your questions, please call your gastroenterologist to clarify.  If you requested that your care partner not be given the details of your procedure findings, then the procedure report has been included in a sealed envelope for you to review at your convenience later.  YOU SHOULD EXPECT: Some feelings of bloating in the abdomen. Passage of more gas than usual.  Walking can help get rid of the air that was put into your GI tract during the procedure and reduce the bloating. If you had a lower endoscopy (such as a colonoscopy or flexible sigmoidoscopy) you may notice spotting of blood in your stool or on the toilet paper. If you underwent a bowel prep for your procedure, you may not have a normal bowel movement for a few days.  Please Note:  You might notice some irritation and congestion in your nose or some drainage.  This is from the oxygen used during your procedure.  There is no need for concern and it should clear up in a day or so.  SYMPTOMS TO REPORT IMMEDIATELY:   Following lower endoscopy (colonoscopy or flexible sigmoidoscopy):  Excessive amounts of blood in the stool  Significant tenderness or worsening of abdominal pains  Swelling of the abdomen that is new, acute  Fever of 100F or higher   For urgent or emergent issues, a gastroenterologist can be reached at any hour by calling [phone].   DIET:  We do recommend a small meal at first,  but then you may proceed to your regular diet.  Drink plenty of fluids but you should avoid alcoholic beverages for 24 hours.  ACTIVITY:  You should plan to take it easy for the rest of today and you should NOT DRIVE or use heavy machinery until tomorrow (because of the sedation medicines used during the test).    FOLLOW UP: Our staff will call the number listed on your records the next business day following your procedure to check on you and address any questions or concerns that you may have regarding the information given to you following your procedure. If we do not reach you, we will leave a message.  However, if you are feeling well and you are not experiencing any problems, there is no need to return our call.  We will assume that you have returned to your regular daily activities without incident.  If any biopsies were taken you will be contacted by phone or by letter within the next 1-3 weeks.  Please call us at [phone] if you have not heard about the biopsies in 3 weeks.    SIGNATURES/CONFIDENTIALITY: You and/or your care partner have signed paperwork which will be entered into your electronic medical record.  These signatures attest to the fact that that the information above on your After Visit Summary has been reviewed and is understood.  Full responsibility of the confidentiality of this discharge information lies with you and/or  your care-partner. 

## 2018-04-04 NOTE — Progress Notes (Signed)
A/ox3 pleased with MAC, report to Holly RN 

## 2018-04-04 NOTE — Progress Notes (Signed)
Called to room to assist during endoscopic procedure.  Patient ID and intended procedure confirmed with present staff. Received instructions for my participation in the procedure from the performing physician.  

## 2018-04-04 NOTE — Op Note (Addendum)
Arthur [name] Patient Name: Arthur [name] Procedure Date: 04/04/2018 8:43 AM MRN: [id number] Endoscopist: Gatha Mayer , MD Age: 77 Referring MD:  Date of Birth: 1941/09/23 Gender: Male Account #: [id number] Procedure:                Colonoscopy Indications:              Surveillance: Personal history of adenomatous                            polyps on last colonoscopy > 5 years ago Medicines:                Propofol per Anesthesia, Monitored Anesthesia Care Procedure:                Pre-Anesthesia Assessment:                           - Prior to the procedure, a History and Physical                            was performed, and patient medications and                            allergies were reviewed. The patient's tolerance of                            previous anesthesia was also reviewed. The risks                            and benefits of the procedure and the sedation                            options and risks were discussed with the patient.                            All questions were answered, and informed consent                            was obtained. Prior Anticoagulants: The patient has                            taken no previous anticoagulant or antiplatelet                            agents. ASA Grade Assessment: III - A patient with                            severe systemic disease. After reviewing the risks                            and benefits, the patient was deemed in                            satisfactory condition to undergo the procedure.  After obtaining informed consent, the colonoscope                            was passed under direct vision. Throughout the                            procedure, the patient's blood pressure, pulse, and                            oxygen saturations were monitored continuously. The                            Colonoscope was introduced through the anus and   advanced to the the cecum, identified by                            appendiceal orifice and ileocecal valve. The                            colonoscopy was performed without difficulty. The                            patient tolerated the procedure well. The quality                            of the bowel preparation was good. The ileocecal                            valve, appendiceal orifice, and rectum were                            photographed. The bowel preparation used was                            Miralax. Scope In: 8:54:27 AM Scope Out: 9:12:43 AM Scope Withdrawal Time: 0 hours 13 minutes 8 seconds  Total Procedure Duration: 0 hours 18 minutes 16 seconds  Findings:                 The perianal examination was normal.                           The digital rectal exam findings include surgically                            absent prostate.                           Three sessile polyps were found in the transverse                            colon and ascending colon. The polyps were                            diminutive in size. These polyps were removed with  a cold snare. Resection and retrieval were                            complete. Verification of patient identification                            for the specimen was done. Estimated blood loss was                            minimal.                           Scattered diverticula were found in the left colon.                           The exam was otherwise without abnormality on                            direct and retroflexion views. Complications:            No immediate complications. Estimated Blood Loss:     Estimated blood loss was minimal. Impression:               - A surgically absent prostate found on digital                            rectal exam.                           - Three diminutive polyps in the transverse colon                            and in the ascending colon, removed  with a cold                            snare. Resected and retrieved.                           - Diverticulosis in the left colon.                           - The examination was otherwise normal on direct                            and retroflexion views.                           - Personal history of colonic polyps. adenomas last                            2011 - also brother had colon cancer Recommendation:           - Patient has a contact number available for                            emergencies. The signs and symptoms of potential  delayed complications were discussed with the                            patient. Return to normal activities tomorrow.                            Written discharge instructions were provided to the                            patient.                           - Resume previous diet.                           - Continue present medications.                           - Repeat may be colonoscopy recommended but at 76                            probably not. Await pathology results from today's                            exam.                           HAVING DYSPHAGIA AGAIN - RXED PANTOPRAZOLE 40 MG QD                            AND WILL SCHEDULE EGD/DILATION Gatha Mayer, MD 04/04/2018 9:21:23 AM This report has been signed electronically.

## 2018-04-05 ENCOUNTER — Other Ambulatory Visit: Payer: Self-pay | Admitting: Family Medicine

## 2018-04-07 ENCOUNTER — Other Ambulatory Visit: Payer: Self-pay | Admitting: Pulmonary Disease

## 2018-04-09 NOTE — Telephone Encounter (Signed)
Pt. Reports he is back to his diet and activities following his endoscopic procedure 04/04/2018.  Plans to return here Wed. For his follow-up.

## 2018-04-10 ENCOUNTER — Other Ambulatory Visit: Payer: Self-pay | Admitting: Family Medicine

## 2018-04-10 DIAGNOSIS — I10 Essential (primary) hypertension: Secondary | ICD-10-CM

## 2018-04-11 NOTE — Telephone Encounter (Signed)
Losartan refill Last Refill:03/22/18 # 90 tab 1RF Last OV: 03/22/18 PCP: Dr Carlota Raspberry Pharmacy:[facility] [address]

## 2018-04-12 ENCOUNTER — Ambulatory Visit (AMBULATORY_SURGERY_CENTER): Payer: Self-pay | Admitting: *Deleted

## 2018-04-12 ENCOUNTER — Other Ambulatory Visit: Payer: Self-pay

## 2018-04-12 ENCOUNTER — Other Ambulatory Visit: Payer: Self-pay | Admitting: Family Medicine

## 2018-04-12 VITALS — Ht 72.0 in | Wt 233.0 lb

## 2018-04-12 DIAGNOSIS — I10 Essential (primary) hypertension: Secondary | ICD-10-CM

## 2018-04-12 DIAGNOSIS — R1310 Dysphagia, unspecified: Secondary | ICD-10-CM

## 2018-04-12 NOTE — Progress Notes (Signed)
No egg or soy allergy known to patient  No issues with past sedation with any surgeries  or procedures, no intubation problems  No diet pills per patient No home 02 use per patient  No blood thinners per patient  Pt denies issues with constipation  No A fib or A flutter  EMMI video sent to pt's e mail  Pt. declined 

## 2018-04-13 ENCOUNTER — Encounter: Payer: Self-pay | Admitting: Internal Medicine

## 2018-04-13 DIAGNOSIS — Z86010 Personal history of colonic polyps: Secondary | ICD-10-CM

## 2018-04-13 NOTE — Progress Notes (Signed)
3 adenomas no recall - age

## 2018-04-18 ENCOUNTER — Encounter: Payer: Self-pay | Admitting: Internal Medicine

## 2018-04-18 ENCOUNTER — Ambulatory Visit (AMBULATORY_SURGERY_CENTER): Payer: PPO | Admitting: Internal Medicine

## 2018-04-18 VITALS — BP 133/78 | HR 62 | Temp 99.1°F | Resp 11 | Ht 72.0 in | Wt 233.0 lb

## 2018-04-18 DIAGNOSIS — R1310 Dysphagia, unspecified: Secondary | ICD-10-CM | POA: Diagnosis not present

## 2018-04-18 DIAGNOSIS — K209 Esophagitis, unspecified without bleeding: Secondary | ICD-10-CM

## 2018-04-18 DIAGNOSIS — I10 Essential (primary) hypertension: Secondary | ICD-10-CM | POA: Diagnosis not present

## 2018-04-18 DIAGNOSIS — K219 Gastro-esophageal reflux disease without esophagitis: Secondary | ICD-10-CM | POA: Diagnosis not present

## 2018-04-18 DIAGNOSIS — J449 Chronic obstructive pulmonary disease, unspecified: Secondary | ICD-10-CM | POA: Diagnosis not present

## 2018-04-18 DIAGNOSIS — R1013 Epigastric pain: Secondary | ICD-10-CM | POA: Diagnosis not present

## 2018-04-18 DIAGNOSIS — K228 Other specified diseases of esophagus: Secondary | ICD-10-CM | POA: Diagnosis not present

## 2018-04-18 MED ORDER — SODIUM CHLORIDE 0.9 % IV SOLN: 500.0000 mL | Freq: Once | INTRAVENOUS | Status: DC

## 2018-04-18 MED ORDER — LANSOPRAZOLE 30 MG PO CPDR: 30.0000 mg | DELAYED_RELEASE_CAPSULE | Freq: Every day | ORAL | 3 refills | Status: DC

## 2018-04-18 NOTE — Progress Notes (Signed)
Pt's states no medical or surgical changes since previsit or office visit. 

## 2018-04-18 NOTE — Progress Notes (Signed)
Report given to PACU, vss 

## 2018-04-18 NOTE — Progress Notes (Signed)
Called to room to assist during endoscopic procedure.  Patient ID and intended procedure confirmed with present staff. Received instructions for my participation in the procedure from the performing physician.  

## 2018-04-18 NOTE — Op Note (Signed)
[place] Patient Name: Arthur [name] Procedure Date: 04/18/2018 8:37 AM MRN: [id number] Endoscopist: Gatha Mayer , MD Age: 77 Referring MD:  Date of Birth: 07-29-1941 Gender: Male Account #: [id number] Procedure:                Upper GI endoscopy Indications:              Dysphagia Medicines:                Propofol per Anesthesia, Monitored Anesthesia Care Procedure:                Pre-Anesthesia Assessment:                           - Prior to the procedure, a History and Physical                            was performed, and patient medications and                            allergies were reviewed. The patient's tolerance of                            previous anesthesia was also reviewed. The risks                            and benefits of the procedure and the sedation                            options and risks were discussed with the patient.                            All questions were answered, and informed consent                            was obtained. Prior Anticoagulants: The patient has                            taken no previous anticoagulant or antiplatelet                            agents. ASA Grade Assessment: III - A patient with                            severe systemic disease. After reviewing the risks                            and benefits, the patient was deemed in                            satisfactory condition to undergo the procedure.                           After obtaining informed consent, the endoscope was  passed under direct vision. Throughout the                            procedure, the patient's blood pressure, pulse, and                            oxygen saturations were monitored continuously. The                            Endoscope was introduced through the mouth, and                            advanced to the second part of duodenum. The upper                            GI endoscopy was  accomplished without difficulty.                            The patient tolerated the procedure well. Scope In: Scope Out: Findings:                 LA Grade B (one or more mucosal breaks greater than                            5 mm, not extending between the tops of two mucosal                            folds) esophagitis with no bleeding was found in                            the distal esophagus. Biopsies were taken with a                            cold forceps for histology. Verification of patient                            identification for the specimen was done. Estimated                            blood loss was minimal.                           The Z-line was irregular and was found at the                            gastroesophageal junction. Biopsies were taken with                            a cold forceps for histology. Verification of                            patient identification for the specimen was done.  Estimated blood loss was minimal.                           The exam was otherwise without abnormality.                           The cardia and gastric fundus were normal on                            retroflexion.                           The scope was withdrawn. Dilation was performed in                            the entire esophagus with a Maloney dilator with no                            resistance at 51 Fr. Estimated blood loss: none. Complications:            No immediate complications. Estimated Blood Loss:     Estimated blood loss was minimal. Impression:               some looks > 1 cm so check for Barrett's                           - LA Grade B reflux esophagitis. Biopsied.                           - Z-line irregular, at the gastroesophageal                            junction. Biopsied.                           - The examination was otherwise normal.                           - Dilation performed in the entire  esophagus. Recommendation:           - Patient has a contact number available for                            emergencies. The signs and symptoms of potential                            delayed complications were discussed with the                            patient. Return to normal activities tomorrow.                            Written discharge instructions were provided to the                            patient.                           -  Clear liquids x 1 hour then soft foods rest of                            day. Start prior diet tomorrow.                           - Continue present medications.                           - Await pathology results.                           - Follow an antireflux regimen.                           - Use Prevacid (lansoprazole) 30 mg PO daily. Gatha Mayer, MD 04/18/2018 9:06:05 AM This report has been signed electronically.

## 2018-04-18 NOTE — Patient Instructions (Addendum)
There was mild inflammation at the end of the esophagus. Likely from acid reflux. I took biopsies and dilated the esophagus.  I think you have GERD - Gastroesophageal Reflux -   I recommend you take lansoprazole 30 mg every day and have sent a prescription to your pharmacy.  Follow an antireflux regimen.  This includes:      - Do not lie down for at least 3 to 4 hours after meals.       - Raise the head of the bed 4 to 6 inches.       - Decrease excess weight.       - Avoid citrus juices and other acidic foods, alcohol, chocolate, mints, coffee and other caffeinated beverages, carbonated beverages, fatty and fried foods.       - Avoid tight-fitting clothing.       - Avoid cigarettes and other tobacco products.    I appreciate the opportunity to care for you. Gatha Mayer, MD, FACG YOU HAD AN ENDOSCOPIC PROCEDURE TODAY AT [facility]:   Refer to the procedure report that was given to you for any specific questions about what was found during the examination.  If the procedure report does not answer your questions, please call your gastroenterologist to clarify.  If you requested that your care partner not be given the details of your procedure findings, then the procedure report has been included in a sealed envelope for you to review at your convenience later.  YOU SHOULD EXPECT: Some feelings of bloating in the abdomen. Passage of more gas than usual.  Walking can help get rid of the air that was put into your GI tract during the procedure and reduce the bloating. If you had a lower endoscopy (such as a colonoscopy or flexible sigmoidoscopy) you may notice spotting of blood in your stool or on the toilet paper. If you underwent a bowel prep for your procedure, you may not have a normal bowel movement for a few days.  Please Note:  You might notice some irritation and congestion in your nose or some drainage.  This is from the oxygen used during your procedure.   There is no need for concern and it should clear up in a day or so.  SYMPTOMS TO REPORT IMMEDIATELY:   Following upper endoscopy (EGD)  Vomiting of blood or coffee ground material  New chest pain or pain under the shoulder blades  Painful or persistently difficult swallowing  New shortness of breath  Fever of 100F or higher  Black, tarry-looking stools  For urgent or emergent issues, a gastroenterologist can be reached at any hour by calling [phone].   DIET:  Please follow a post-dilation diet (see handout given to you by recovery nurse). Clear liquids for 1 hour starting at 10:00 am, then follow a soft diet for the rest of today starting at 11:00 am, proceed to your regular diet tomorrow as tolerated. Drink plenty of fluids but you should avoid alcoholic beverages for 24 hours.  MEDICATIONS: Continue present medications. Use Prevacid (lansoprazole) 30 mg by mouth daily.  Please see handouts given to you by your recovery nurse.  ACTIVITY:  You should plan to take it easy for the rest of today and you should NOT DRIVE or use heavy machinery until tomorrow (because of the sedation medicines used during the test).    FOLLOW UP: Our staff will call the number listed on your records the next business day following your  procedure to check on you and address any questions or concerns that you may have regarding the information given to you following your procedure. If we do not reach you, we will leave a message.  However, if you are feeling well and you are not experiencing any problems, there is no need to return our call.  We will assume that you have returned to your regular daily activities without incident.  If any biopsies were taken you will be contacted by phone or by letter within the next 1-3 weeks.  Please call us at [phone] if you have not heard about the biopsies in 3 weeks.   Thank you for allowing [place] to provide for your healthcare needs  today.   SIGNATURES/CONFIDENTIALITY: You and/or your care partner have signed paperwork which will be entered into your electronic medical record.  These signatures attest to the fact that that the information above on your After Visit Summary has been reviewed and is understood.  Full responsibility of the confidentiality of this discharge information lies with you and/or your care-partner.

## 2018-04-19 ENCOUNTER — Other Ambulatory Visit: Payer: Self-pay | Admitting: Family Medicine

## 2018-04-19 NOTE — Telephone Encounter (Signed)
[place] called and spoke to [place], [facility] about the carvedilol refill request, she says disregard it because a 90 day was received on 03/22/18 with 1 refill.

## 2018-04-19 NOTE — Telephone Encounter (Signed)
Follow-up call following endoscopic procedure 04/18/2018.   Spoke to pt.  He reports he is back to his diet and activities, and is doing fine.  Told pt. To call if he has any questions or concerns in the future.

## 2018-04-25 ENCOUNTER — Encounter: Payer: Self-pay | Admitting: Internal Medicine

## 2018-04-25 NOTE — Progress Notes (Signed)
Reflux changes Stay on PPi F/u prn Letter to aptient

## 2018-05-14 ENCOUNTER — Ambulatory Visit (HOSPITAL_COMMUNITY): Payer: PPO | Attending: Cardiology

## 2018-05-14 ENCOUNTER — Other Ambulatory Visit: Payer: Self-pay

## 2018-05-14 DIAGNOSIS — I428 Other cardiomyopathies: Secondary | ICD-10-CM | POA: Insufficient documentation

## 2018-05-14 DIAGNOSIS — E785 Hyperlipidemia, unspecified: Secondary | ICD-10-CM | POA: Insufficient documentation

## 2018-05-14 DIAGNOSIS — J449 Chronic obstructive pulmonary disease, unspecified: Secondary | ICD-10-CM | POA: Insufficient documentation
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
## 2018-05-21 ENCOUNTER — Ambulatory Visit: Payer: PPO | Admitting: Cardiovascular Disease

## 2018-05-21 ENCOUNTER — Encounter: Payer: Self-pay | Admitting: Cardiovascular Disease

## 2018-05-21 VITALS — BP 132/84 | HR 68 | Ht 72.0 in | Wt 235.2 lb

## 2018-05-21 DIAGNOSIS — I429 Cardiomyopathy, unspecified: Secondary | ICD-10-CM

## 2018-05-21 DIAGNOSIS — I5022 Chronic systolic (congestive) heart failure: Secondary | ICD-10-CM

## 2018-05-21 DIAGNOSIS — I10 Essential (primary) hypertension: Secondary | ICD-10-CM

## 2018-05-21 DIAGNOSIS — R55 Syncope and collapse: Secondary | ICD-10-CM

## 2018-05-21 NOTE — Patient Instructions (Addendum)

## 2018-05-25 ENCOUNTER — Other Ambulatory Visit: Payer: Self-pay | Admitting: Family Medicine

## 2018-05-25 DIAGNOSIS — E039 Hypothyroidism, unspecified: Secondary | ICD-10-CM

## 2018-06-05 ENCOUNTER — Encounter: Payer: Self-pay | Admitting: Family Medicine

## 2018-06-05 ENCOUNTER — Ambulatory Visit (INDEPENDENT_AMBULATORY_CARE_PROVIDER_SITE_OTHER): Payer: PPO | Admitting: Family Medicine

## 2018-06-05 VITALS — BP 124/62 | HR 80 | Temp 99.0°F | Resp 16 | Ht 72.0 in | Wt 230.0 lb

## 2018-06-05 DIAGNOSIS — H6502 Acute serous otitis media, left ear: Secondary | ICD-10-CM

## 2018-06-05 DIAGNOSIS — H109 Unspecified conjunctivitis: Secondary | ICD-10-CM

## 2018-06-05 MED ORDER — AMOXICILLIN-POT CLAVULANATE 875-125 MG PO TABS: 1.0000 | ORAL_TABLET | Freq: Two times a day (BID) | ORAL | 0 refills | Status: AC

## 2018-06-05 MED ORDER — POLYMYXIN B-TRIMETHOPRIM 10000-0.1 UNIT/ML-% OP SOLN: 2.0000 [drp] | Freq: Four times a day (QID) | OPHTHALMIC | 0 refills | Status: DC

## 2018-06-05 NOTE — Patient Instructions (Addendum)
If you have lab work done today you will be contacted with your lab results within the next 2 weeks.  If you have not heard from [place] then please contact us. The fastest way to get your results is to register for My Chart.   IF you received an x-ray today, you will receive an invoice from [facility]. Please contact [facility] at [phone] with questions or concerns regarding your invoice.   IF you received labwork today, you will receive an invoice from [facility]. Please contact [facility] at [phone] with questions or concerns regarding your invoice.   Our billing staff will not be able to assist you with questions regarding bills from these companies.  You will be contacted with the lab results as soon as they are available. The fastest way to get your results is to activate your My Chart account. Instructions are located on the last page of this paperwork. If you have not heard from [place] regarding the results in 2 weeks, please contact this office.     Bacterial Conjunctivitis Bacterial conjunctivitis is an infection of the clear membrane that covers the white part of your eye and the inner surface of your eyelid (conjunctiva). When the blood vessels in your conjunctiva become inflamed, your eye becomes red or pink, and it will probably feel itchy. Bacterial conjunctivitis spreads very easily from person to person (is contagious). It also spreads easily from one eye to the other eye. What are the causes? This condition is caused by several common bacteria. You may get the infection if you come into close contact with another person who is infected. You may also come into contact with items that are contaminated with the bacteria, such as a face towel, contact lens solution, or eye makeup. What increases the risk? This condition is more likely to develop in people who:  Are exposed to other people who have the infection.  Wear contact lenses.  Have a sinus  infection.  Have had a recent eye injury or surgery.  Have a weak body defense system (immune system).  Have a medical condition that causes dry eyes.  What are the signs or symptoms? Symptoms of this condition include:  Eye redness.  Tearing or watery eyes.  Itchy eyes.  Burning feeling in your eyes.  Thick, yellowish discharge from an eye. This may turn into a crust on the eyelid overnight and cause your eyelids to stick together.  Swollen eyelids.  Blurred vision.  How is this diagnosed? Your health care provider can diagnose this condition based on your symptoms and medical history. Your health care provider may also take a sample of discharge from your eye to find the cause of your infection. This is rarely done. How is this treated? Treatment for this condition includes:  Antibiotic eye drops or ointment to clear the infection more quickly and prevent the spread of infection to others.  Oral antibiotic medicines to treat infections that do not respond to drops or ointments, or last longer than 10 days.  Cool, wet cloths (cool compresses) placed on the eyes.  Artificial tears applied 2-6 times a day.  Follow these instructions at home: Medicines  Take or apply your antibiotic medicine as told by your health care provider. Do not stop taking or applying the antibiotic even if you start to feel better.  Take or apply over-the-counter and prescription medicines only as told by your health care provider.  Be very careful to avoid touching the edge  of your eyelid with the eye drop bottle or the ointment tube when you apply medicines to the affected eye. This will keep you from spreading the infection to your other eye or to other people. Managing discomfort  Gently wipe away any drainage from your eye with a warm, wet washcloth or a cotton ball.  Apply a cool, clean washcloth to your eye for 10-20 minutes, 3-4 times a day. General instructions  Do not wear contact  lenses until the inflammation is gone and your health care provider says it is safe to wear them again. Ask your health care provider how to sterilize or replace your contact lenses before you use them again. Wear glasses until you can resume wearing contacts.  Avoid wearing eye makeup until the inflammation is gone. Throw away any old eye cosmetics that may be contaminated.  Change or wash your pillowcase every day.  Do not share towels or washcloths. This may spread the infection.  Wash your hands often with soap and water. Use paper towels to dry your hands.  Avoid touching or rubbing your eyes.  Do not drive or use heavy machinery if your vision is blurred. Contact a health care provider if:  You have a fever.  Your symptoms do not get better after 10 days. Get help right away if:  You have a fever and your symptoms suddenly get worse.  You have severe pain when you move your eye.  You have facial pain, redness, or swelling.  You have sudden loss of vision. This information is not intended to replace advice given to you by your health care provider. Make sure you discuss any questions you have with your health care provider. Document Released: 09/19/2005 Document Revised: 01/28/2016 Document Reviewed: 07/02/2015 Elsevier Interactive Patient Education  2017 [place].  Otitis Media, Adult Otitis media occurs when there is inflammation and fluid in the middle ear. Your middle ear is a part of the ear that contains bones for hearing as well as air that helps send sounds to your brain. What are the causes? This condition is caused by a blockage in the eustachian tube. This tube drains fluid from the ear to the back of the nose (nasopharynx). A blockage in this tube can be caused by an object or by swelling (edema) in the tube. Problems that can cause a blockage include:  A cold or other upper respiratory infection.  Allergies.  An irritant, such as tobacco  smoke.  Enlarged adenoids. The adenoids are areas of soft tissue located high in the back of the throat, behind the nose and the roof of the mouth.  A mass in the nasopharynx.  Damage to the ear caused by pressure changes (barotrauma).  What are the signs or symptoms? Symptoms of this condition include:  Ear pain.  A fever.  Decreased hearing.  A headache.  Tiredness (lethargy).  Fluid leaking from the ear.  Ringing in the ear.  How is this diagnosed? This condition is diagnosed with a physical exam. During the exam your health care provider will use an instrument called an otoscope to look into your ear and check for redness, swelling, and fluid. He or she will also ask about your symptoms. Your health care provider may also order tests, such as:  A test to check the movement of the eardrum (pneumatic otoscopy). This test is done by squeezing a small amount of air into the ear.  A test that changes air pressure in the middle ear to  check how well the eardrum moves and whether the eustachian tube is working (tympanogram).  How is this treated? This condition usually goes away on its own within 3-5 days. But if the condition is caused by a bacteria infection and does not go away own its own, or keeps coming back, your health care provider may:  Prescribe antibiotic medicines to treat the infection.  Prescribe or recommend medicines to control pain.  Follow these instructions at home:  Take over-the-counter and prescription medicines only as told by your health care provider.  If you were prescribed an antibiotic medicine, take it as told by your health care provider. Do not stop taking the antibiotic even if you start to feel better.  Keep all follow-up visits as told by your health care provider. This is important. Contact a health care provider if:  You have bleeding from your nose.  There is a lump on your neck.  You are not getting better in 5 days.  You feel  worse instead of better. Get help right away if:  You have severe pain that is not controlled with medicine.  You have swelling, redness, or pain around your ear.  You have stiffness in your neck.  A part of your face is paralyzed.  The bone behind your ear (mastoid) is tender when you touch it.  You develop a severe headache. Summary  Otitis media is redness, soreness, and swelling of the middle ear.  This condition usually goes away on its own within 3-5 days.  If the problem does not go away in 3-5 days, your health care provider may prescribe or recommend medicines to treat your symptoms.  If you were prescribed an antibiotic medicine, take it as told by your health care provider. This information is not intended to replace advice given to you by your health care provider. Make sure you discuss any questions you have with your health care provider. Document Released: 06/24/2004 Document Revised: 09/09/2016 Document Reviewed: 09/09/2016 Elsevier Interactive Patient Education  [employer].
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
## 2018-06-29 IMAGING — CR DG CHEST 2V
2 series · 2 of 2 positions shown · non-contrast
Comparison: 02/26/2016

CLINICAL DATA: Pt c/o onset of mid chest pain with SOB today;
syncopal episode Xfew weeks ago, currently wearing heart monitor
from cardiologist; hx COPD, smoker

EXAM:
CHEST - 2 VIEW

[chest pa]
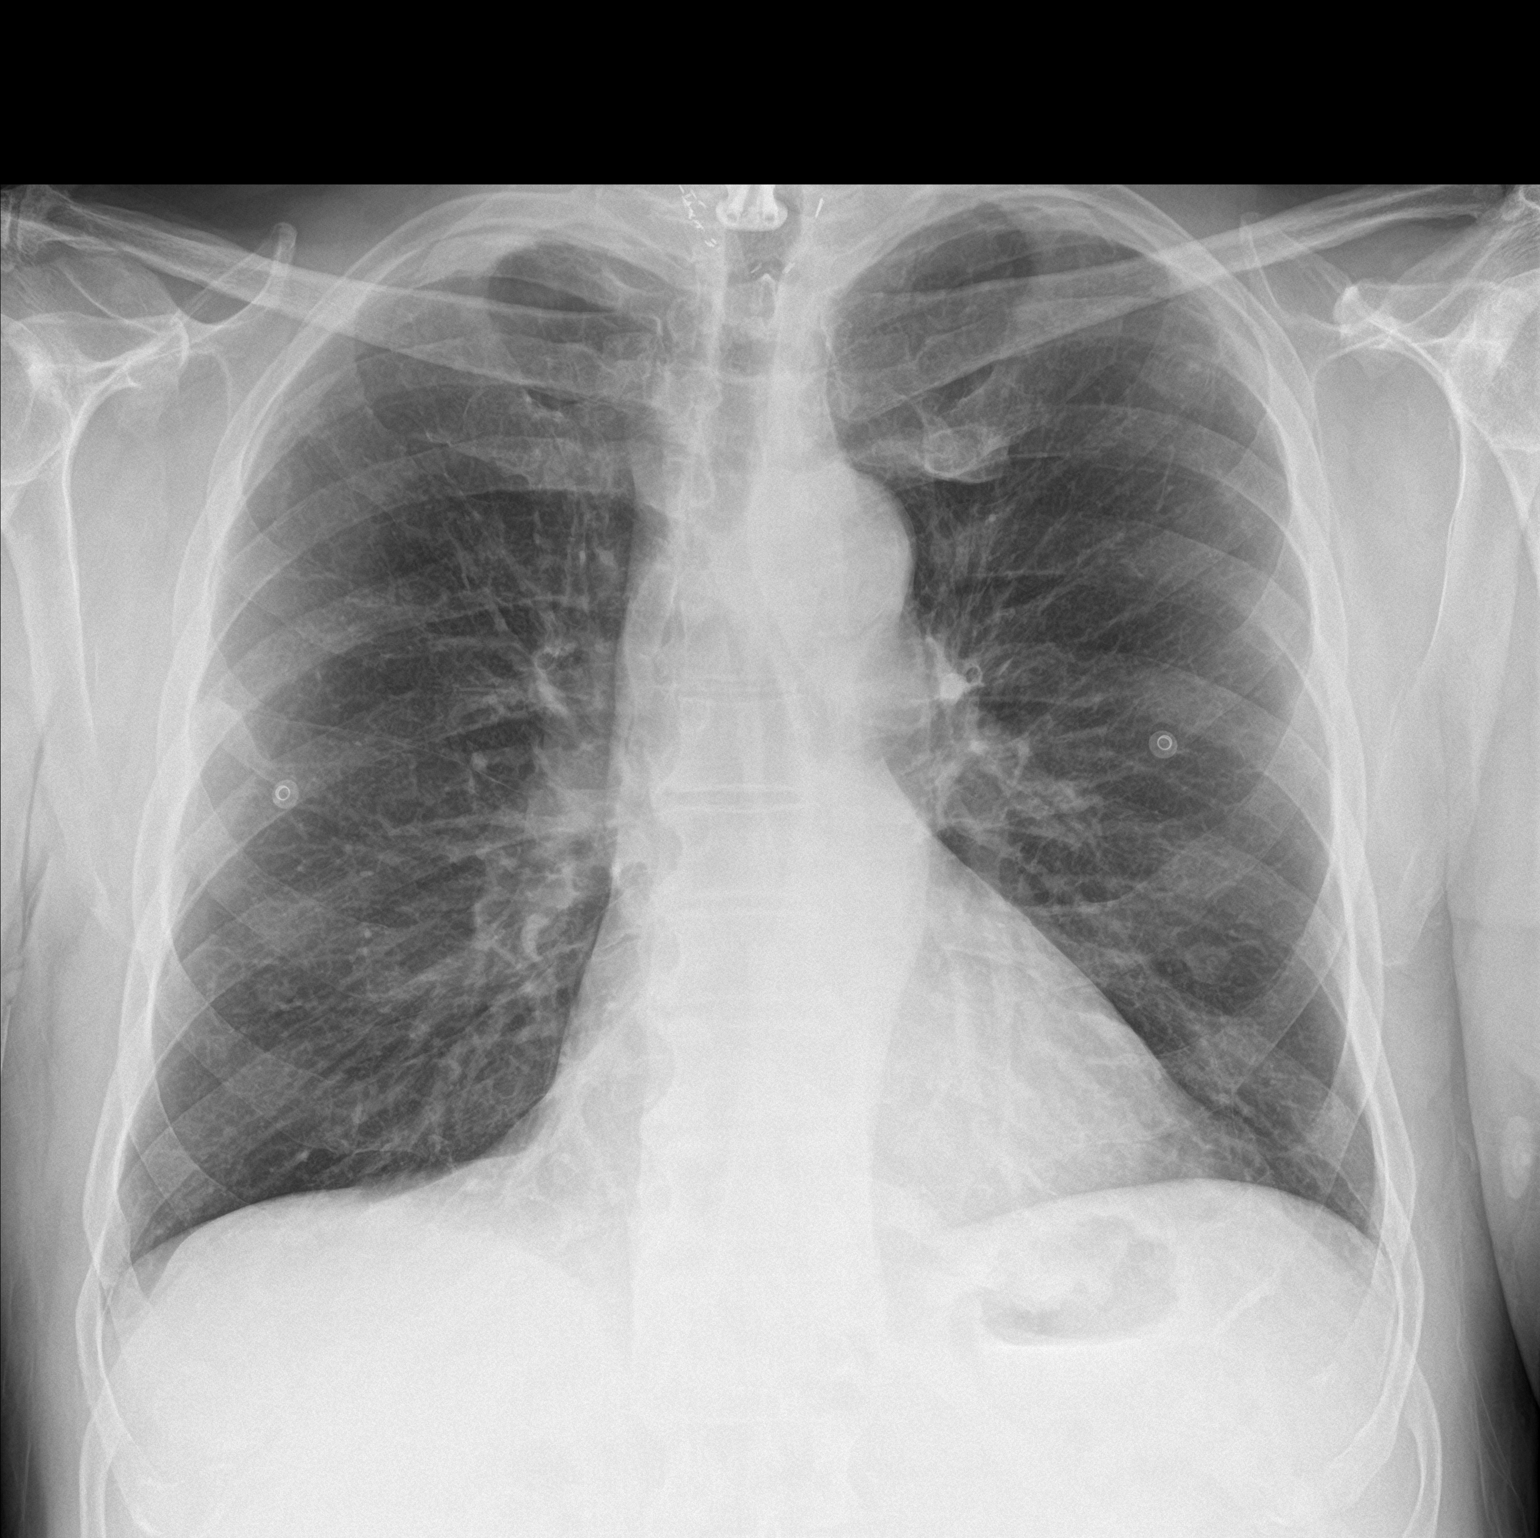

[chest lat]
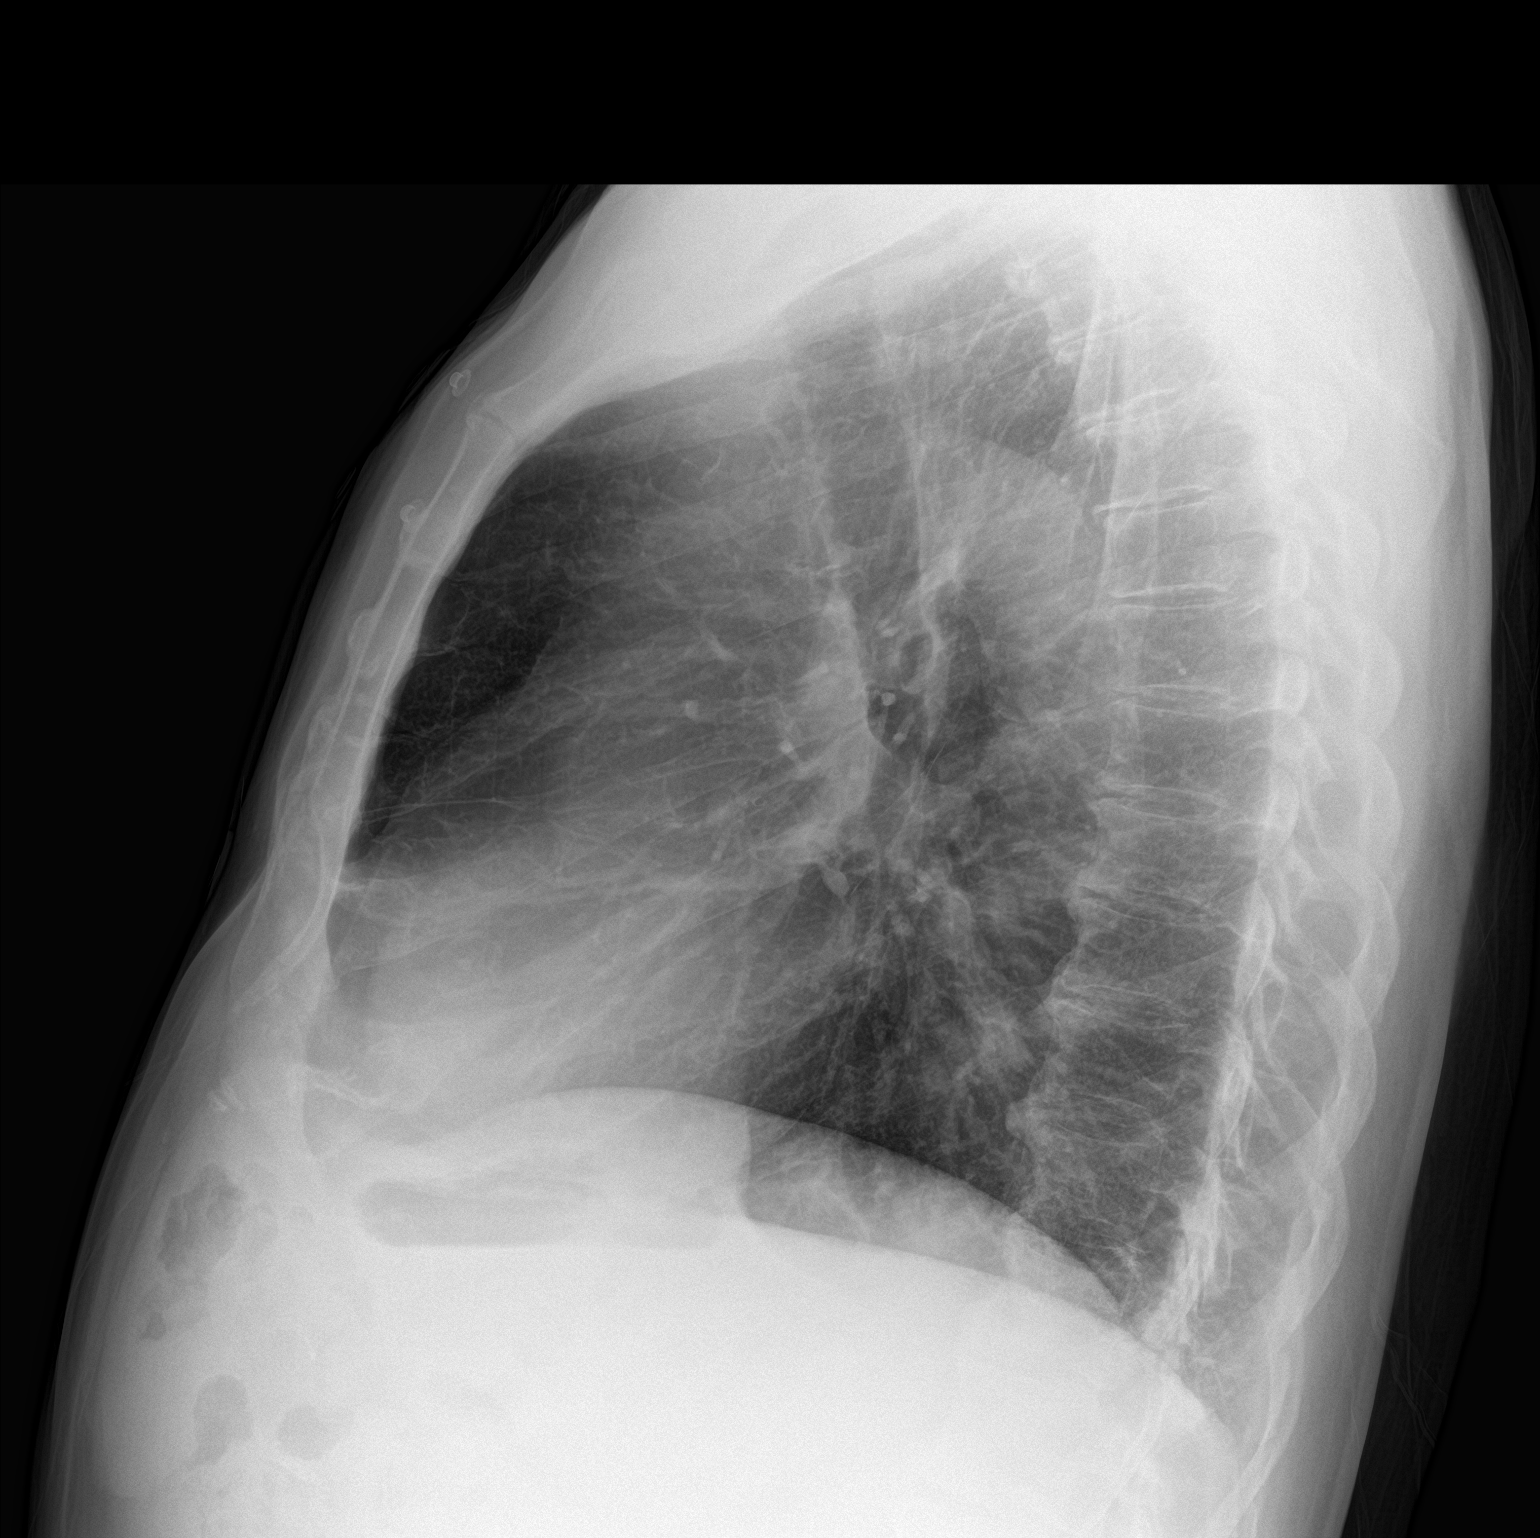

[2 of 2 positions shown; findings below may reference images not displayed]

FINDINGS: Lungs are clear.

Heart size and mediastinal contours are within normal limits. Mildly
tortuous thoracic aorta.

No effusion or pneumothorax.

Cervical fixation hardware. Anterior vertebral endplate spurring at
multiple levels in the mid and lower thoracic spine. Surgical clips
at the thoracic inlet.
IMPRESSION: No acute cardiopulmonary disease.

## 2018-07-07 ENCOUNTER — Other Ambulatory Visit: Payer: Self-pay | Admitting: Cardiology

## 2018-09-19 ENCOUNTER — Ambulatory Visit (INDEPENDENT_AMBULATORY_CARE_PROVIDER_SITE_OTHER): Payer: PPO | Admitting: Adult Health

## 2018-09-19 ENCOUNTER — Encounter: Payer: Self-pay | Admitting: Adult Health

## 2018-09-19 DIAGNOSIS — R918 Other nonspecific abnormal finding of lung field: Secondary | ICD-10-CM | POA: Diagnosis not present

## 2018-09-19 DIAGNOSIS — J449 Chronic obstructive pulmonary disease, unspecified: Secondary | ICD-10-CM

## 2018-09-19 DIAGNOSIS — F17210 Nicotine dependence, cigarettes, uncomplicated: Secondary | ICD-10-CM | POA: Diagnosis not present

## 2018-09-19 MED ORDER — TIOTROPIUM BROMIDE-OLODATEROL 2.5-2.5 MCG/ACT IN AERS: 2.0000 | INHALATION_SPRAY | Freq: Every day | RESPIRATORY_TRACT | 0 refills | Status: DC

## 2018-09-19 NOTE — Assessment & Plan Note (Signed)
Unchanged and stable on serial CT felt to be consistent with benign etiology. Patient does fit the criteria for the low-dose CT screening program.  Will refer
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
## 2018-09-19 NOTE — Assessment & Plan Note (Signed)
Smoking cessation discussed and encouraged

## 2018-09-19 NOTE — Assessment & Plan Note (Signed)
Moderate COPD with emphysema appears to be stable.  Patient does still smoke 1 to 2 cigarettes a month.  He is encouraged on smoking cessation. Patient is continue on [name]. Refer to LDCT screening program   Plan  Patient Instructions  Continue on [name] 2 puff daily . Rinse after use.  Refer to LDCT Chest screening program with Anselm Lis.  Activity as tolerated.  Follow up Dr. Elsworth Soho  In 6 months and As needed   Please contact office for sooner follow up if symptoms do not improve or worsen or seek emergency care

## 2018-09-19 NOTE — Patient Instructions (Signed)
Continue on [name] 2 puff daily . Rinse after use.  Refer to LDCT Chest screening program with Anselm Lis.  Activity as tolerated.  Follow up Dr. Elsworth Soho  In 6 months and As needed   Please contact office for sooner follow up if symptoms do not improve or worsen or seek emergency care

## 2018-09-20 ENCOUNTER — Other Ambulatory Visit: Payer: Self-pay

## 2018-09-20 ENCOUNTER — Encounter: Payer: Self-pay | Admitting: Family Medicine

## 2018-09-20 ENCOUNTER — Ambulatory Visit (INDEPENDENT_AMBULATORY_CARE_PROVIDER_SITE_OTHER): Payer: PPO | Admitting: Family Medicine

## 2018-09-20 VITALS — BP 142/82 | HR 70 | Temp 98.4°F | Resp 16 | Ht 72.0 in | Wt 238.0 lb

## 2018-09-20 DIAGNOSIS — E785 Hyperlipidemia, unspecified: Secondary | ICD-10-CM

## 2018-09-20 DIAGNOSIS — I10 Essential (primary) hypertension: Secondary | ICD-10-CM | POA: Diagnosis not present

## 2018-09-20 DIAGNOSIS — E039 Hypothyroidism, unspecified: Secondary | ICD-10-CM

## 2018-09-20 MED ORDER — LEVOTHYROXINE SODIUM 200 MCG PO TABS: 200.0000 ug | ORAL_TABLET | Freq: Every day | ORAL | 1 refills | Status: DC

## 2018-09-20 MED ORDER — LOSARTAN POTASSIUM 50 MG PO TABS: 50.0000 mg | ORAL_TABLET | Freq: Every day | ORAL | 1 refills | Status: DC

## 2018-09-20 MED ORDER — ATORVASTATIN CALCIUM 10 MG PO TABS: ORAL_TABLET | ORAL | 1 refills | Status: DC

## 2018-09-20 MED ORDER — CARVEDILOL 3.125 MG PO TABS: 3.1250 mg | ORAL_TABLET | Freq: Two times a day (BID) | ORAL | 1 refills | Status: DC

## 2018-09-20 NOTE — Patient Instructions (Addendum)
° ° ° °  If you have lab work done today you will be contacted with your lab results within the next 2 weeks.  If you have not heard from us then please contact us. The fastest way to get your results is to register for My Chart. ° ° °IF you received an x-ray today, you will receive an invoice from [facility]. Please contact [facility] at [phone] with questions or concerns regarding your invoice.  ° °IF you received labwork today, you will receive an invoice from [facility]. Please contact [facility] at [phone] with questions or concerns regarding your invoice.  ° °Our billing staff will not be able to assist you with questions regarding bills from these companies. ° °You will be contacted with the lab results as soon as they are available. The fastest way to get your results is to activate your My Chart account. Instructions are located on the last page of this paperwork. If you have not heard from us regarding the results in 2 weeks, please contact this office. °  ° ° ° °
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
## 2018-09-21 LAB — COMPREHENSIVE METABOLIC PANEL
ALT: 23 IU/L (ref 0–44)
AST: 19 IU/L (ref 0–40)
Albumin/Globulin Ratio: 1.7 (ref 1.2–2.2)
Albumin: 4.4 g/dL (ref 3.5–4.8)
Alkaline Phosphatase: 97 IU/L (ref 39–117)
BUN/Creatinine Ratio: 20 (ref 10–24)
BUN: 21 mg/dL (ref 8–27)
Bilirubin Total: 0.6 mg/dL (ref 0.0–1.2)
CO2: 26 mmol/L (ref 20–29)
CREATININE: 1.04 mg/dL (ref 0.76–1.27)
Calcium: 9.6 mg/dL (ref 8.6–10.2)
Chloride: 101 mmol/L (ref 96–106)
GFR calc Af Amer: 80 mL/min/{1.73_m2} (ref >59)
GFR calc non Af Amer: 69 mL/min/{1.73_m2} (ref >59)
Globulin, Total: 2.6 g/dL (ref 1.5–4.5)
Glucose: 96 mg/dL (ref 65–99)
Potassium: 4.9 mmol/L (ref 3.5–5.2)
Sodium: 143 mmol/L (ref 134–144)
Total Protein: 7 g/dL (ref 6.0–8.5)

## 2018-09-21 LAB — LIPID PANEL
CHOL/HDL RATIO: 3.6 ratio (ref 0.0–5.0)
Cholesterol, Total: 133 mg/dL (ref 100–199)
HDL: 37 mg/dL — AB (ref >39)
LDL CALC: 79 mg/dL (ref 0–99)
Triglycerides: 86 mg/dL (ref 0–149)
VLDL CHOLESTEROL CAL: 17 mg/dL (ref 5–40)

## 2018-09-21 LAB — TSH: TSH: 0.98 u[IU]/mL (ref 0.450–4.500)

## 2018-09-24 ENCOUNTER — Encounter: Payer: Self-pay | Admitting: Family Medicine

## 2018-10-11 ENCOUNTER — Other Ambulatory Visit: Payer: Self-pay | Admitting: Family Medicine

## 2018-10-11 DIAGNOSIS — I10 Essential (primary) hypertension: Secondary | ICD-10-CM

## 2018-10-11 NOTE — Telephone Encounter (Signed)
Requested Prescriptions  Pending Prescriptions Disp Refills  . losartan (COZAAR) 50 MG tablet [Pharmacy Med Name: LOSARTAN 50MG  TABLETS] 90 tablet 1    Sig: TAKE 1 TABLET(50 MG) BY MOUTH DAILY     Cardiovascular:  Angiotensin Receptor Blockers Failed - 10/11/2018  3:33 AM      Failed - Last BP in normal range    BP Readings from Last 1 Encounters:  09/20/18 (!) 142/82         Passed - Cr in normal range and within 180 days    Creat  Date Value Ref Range Status  08/24/2016 0.98 0.70 - 1.18 mg/dL Final    Comment:      For patients > or = 76 years of age: The upper reference limit for Creatinine is approximately 13% higher for people identified as African-American.      Creatinine, Ser  Date Value Ref Range Status  09/20/2018 1.04 0.76 - 1.27 mg/dL Final         Passed - K in normal range and within 180 days    Potassium  Date Value Ref Range Status  09/20/2018 4.9 3.5 - 5.2 mmol/L Final         Passed - Patient is not pregnant      Passed - Valid encounter within last 6 months    Recent Outpatient Visits          3 weeks ago Hyperlipidemia, unspecified hyperlipidemia type   [facility], Ranell Patrick, MD   4 months ago Non-recurrent acute serous otitis media of left ear   [facility], Arlie Solomons, MD   6 months ago Medicare annual wellness visit, subsequent   [facility], Ranell Patrick, MD   7 months ago Dizziness   [facility], Ranell Patrick, MD   7 months ago Dizziness   [facility], Ranell Patrick, MD      Future Appointments            In 5 months Carlota Raspberry Ranell Patrick, MD [facility], [facility]

## 2018-10-14 ENCOUNTER — Other Ambulatory Visit: Payer: Self-pay | Admitting: Family Medicine

## 2018-10-14 DIAGNOSIS — E785 Hyperlipidemia, unspecified: Secondary | ICD-10-CM

## 2018-11-19 ENCOUNTER — Other Ambulatory Visit: Payer: Self-pay | Admitting: Family Medicine

## 2018-11-19 DIAGNOSIS — E039 Hypothyroidism, unspecified: Secondary | ICD-10-CM

## 2018-12-09 ENCOUNTER — Other Ambulatory Visit: Payer: Self-pay | Admitting: Pulmonary Disease

## 2019-02-20 ENCOUNTER — Telehealth: Payer: Self-pay | Admitting: Acute Care

## 2019-02-20 ENCOUNTER — Telehealth: Payer: Self-pay | Admitting: Pulmonary Disease

## 2019-02-20 NOTE — Telephone Encounter (Signed)
LVM for patient to return call regarding [place] recommendations below. X1 

## 2019-02-20 NOTE — Telephone Encounter (Signed)
Called pt's pharmacy and spoke with Jaymie who stated that pt's insurance will not cover [place]. Covered alternatives are Symbicort and Breo.  Tonya, please advise on this in regards to a different inhaler to switch pt to. Thanks!

## 2019-02-20 NOTE — Telephone Encounter (Signed)
May trial Breo 100/25 mcg 1 puff daily. Please send order to pharmacy. Thanks.

## 2019-02-20 NOTE — Telephone Encounter (Signed)
I returned a call to Mr. Stanforth . He had called the answering service regarding the increase in price of his Stialto Respimat. Price has gone from $45 a month to over $500 a month for him.  He states he cannot afford this medication anymore. He states that he has been out of this medicine for about 3 weeks. I have asked him to call his insurance company to determine the preferred medication in that drug class so that we can prescribe it. Triage:  Please call Mr. Patras in the morning and help him to determine the preferred drug in this drug class so that we can prescribe it. Please provide him with a sample of [name] Respimat 2.5 so that he has medication until we can determine the best substitution for him. I explained that the process for pickup is to stay in your car so that we can bring the samples out to him. I have asked him to bring in ID with him when he comes to pick up a sample of [name]. Please let him know if the pick up is any different from what I have told him.  Thank you

## 2019-02-21 MED ORDER — FLUTICASONE FUROATE-VILANTEROL 100-25 MCG/INH IN AEPB: 1.0000 | INHALATION_SPRAY | Freq: Every day | RESPIRATORY_TRACT | 1 refills | Status: DC

## 2019-02-21 NOTE — Telephone Encounter (Signed)
There is another open encounter from 02/20/2019 in regards to pt's [name]. Please refer to that encounter.

## 2019-02-21 NOTE — Telephone Encounter (Signed)
Called and spoke with pt letting him know that we did call pharmacy to receive covered alternatives for [name] and after speaking with [place] in regards to the covered alternatives, stated to pt that we were going to send Rx for Breo 100 to pharmacy for pt to do a trial of to see how it works for him. Pt expressed understanding. Pt asked if I could send a 90-day supply of inhaler to pharmacy so I did. Nothing further needed.

## 2019-03-25 ENCOUNTER — Ambulatory Visit: Payer: PPO | Admitting: Family Medicine

## 2019-04-01 ENCOUNTER — Ambulatory Visit (INDEPENDENT_AMBULATORY_CARE_PROVIDER_SITE_OTHER): Payer: PPO | Admitting: Family Medicine

## 2019-04-01 VITALS — BP 132/60 | Ht 72.0 in | Wt 238.0 lb

## 2019-04-01 DIAGNOSIS — Z0000 Encounter for general adult medical examination without abnormal findings: Secondary | ICD-10-CM
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
## 2019-04-01 NOTE — Patient Instructions (Signed)
Thank you for taking time to come for your Medicare Wellness Visit. I appreciate your ongoing commitment to your health goals. Please review the following plan we discussed and let me know if I can assist you in the future.  Leroy Kennedy LPN  Preventive Care 77 Years and Older, Male Preventive care refers to lifestyle choices and visits with your health care provider that can promote health and wellness. This includes:  A yearly physical exam. This is also called an annual well check.  Regular dental and eye exams.  Immunizations.  Screening for certain conditions.  Healthy lifestyle choices, such as diet and exercise. What can I expect for my preventive care visit? Physical exam Your health care provider will check:  Height and weight. These may be used to calculate body mass index (BMI), which is a measurement that tells if you are at a healthy weight.  Heart rate and blood pressure.  Your skin for abnormal spots. Counseling Your health care provider may ask you questions about:  Alcohol, tobacco, and drug use.  Emotional well-being.  Home and relationship well-being.  Sexual activity.  Eating habits.  History of falls.  Memory and ability to understand (cognition).  Work and work Statistician. What immunizations do I need?  Influenza (flu) vaccine  This is recommended every year. Tetanus, diphtheria, and pertussis (Tdap) vaccine  You may need a Td booster every 10 years. Varicella (chickenpox) vaccine  You may need this vaccine if you have not already been vaccinated. Zoster (shingles) vaccine  You may need this after age 66. Pneumococcal conjugate (PCV13) vaccine  One dose is recommended after age 84. Pneumococcal polysaccharide (PPSV23) vaccine  One dose is recommended after age 88. Measles, mumps, and rubella (MMR) vaccine  You may need at least one dose of MMR if you were born in 1957 or later. You may also need a second dose. Meningococcal  conjugate (MenACWY) vaccine  You may need this if you have certain conditions. Hepatitis A vaccine  You may need this if you have certain conditions or if you travel or work in places where you may be exposed to hepatitis A. Hepatitis B vaccine  You may need this if you have certain conditions or if you travel or work in places where you may be exposed to hepatitis B. Haemophilus influenzae type b (Hib) vaccine  You may need this if you have certain conditions. You may receive vaccines as individual doses or as more than one vaccine together in one shot (combination vaccines). Talk with your health care provider about the risks and benefits of combination vaccines. What tests do I need? Blood tests  Lipid and cholesterol levels. These may be checked every 5 years, or more frequently depending on your overall health.  Hepatitis C test.  Hepatitis B test. Screening  Lung cancer screening. You may have this screening every year starting at age 24 if you have a 30-pack-year history of smoking and currently smoke or have quit within the past 15 years.  Colorectal cancer screening. All adults should have this screening starting at age 52 and continuing until age 61. Your health care provider may recommend screening at age 57 if you are at increased risk. You will have tests every 1-10 years, depending on your results and the type of screening test.  Prostate cancer screening. Recommendations will vary depending on your family history and other risks.  Diabetes screening. This is done by checking your blood sugar (glucose) after you have not eaten for  a while (fasting). You may have this done every 1-3 years.  Abdominal aortic aneurysm (AAA) screening. You may need this if you are a current or former smoker.  Sexually transmitted disease (STD) testing. Follow these instructions at home: Eating and drinking  Eat a diet that includes fresh fruits and vegetables, whole grains, lean  protein, and low-fat dairy products. Limit your intake of foods with high amounts of sugar, saturated fats, and salt.  Take vitamin and mineral supplements as recommended by your health care provider.  Do not drink alcohol if your health care provider tells you not to drink.  If you drink alcohol: ? Limit how much you have to 0-2 drinks a day. ? Be aware of how much alcohol is in your drink. In the [place] one drink equals one 12 oz bottle of beer (355 mL), one 5 oz glass of wine (148 mL), or one 1 oz glass of hard liquor (44 mL). Lifestyle  Take daily care of your teeth and gums.  Stay active. Exercise for at least 30 minutes on 5 or more days each week.  Do not use any products that contain nicotine or tobacco, such as cigarettes, e-cigarettes, and chewing tobacco. If you need help quitting, ask your health care provider.  If you are sexually active, practice safe sex. Use a condom or other form of protection to prevent STIs (sexually transmitted infections).  Talk with your health care provider about taking a low-dose aspirin or statin. What's next?  Visit your health care provider once a year for a well check visit.  Ask your health care provider how often you should have your eyes and teeth checked.  Stay up to date on all vaccines. This information is not intended to replace advice given to you by your health care provider. Make sure you discuss any questions you have with your health care provider. Document Released: 10/16/2015 Document Revised: 09/13/2018 Document Reviewed: 09/13/2018 Elsevier Patient Education  2020 [employer].

## 2019-04-05 ENCOUNTER — Other Ambulatory Visit: Payer: Self-pay | Admitting: Internal Medicine

## 2019-04-19 ENCOUNTER — Encounter: Payer: Self-pay | Admitting: Family Medicine

## 2019-04-19 ENCOUNTER — Ambulatory Visit (INDEPENDENT_AMBULATORY_CARE_PROVIDER_SITE_OTHER): Payer: PPO | Admitting: Family Medicine

## 2019-04-19 ENCOUNTER — Other Ambulatory Visit: Payer: Self-pay

## 2019-04-19 VITALS — BP 123/61 | HR 66 | Temp 97.8°F | Resp 18 | Ht 72.0 in | Wt 244.6 lb

## 2019-04-19 DIAGNOSIS — J449 Chronic obstructive pulmonary disease, unspecified: Secondary | ICD-10-CM | POA: Diagnosis not present

## 2019-04-19 DIAGNOSIS — E039 Hypothyroidism, unspecified: Secondary | ICD-10-CM

## 2019-04-19 DIAGNOSIS — I10 Essential (primary) hypertension: Secondary | ICD-10-CM

## 2019-04-19 DIAGNOSIS — E785 Hyperlipidemia, unspecified: Secondary | ICD-10-CM | POA: Diagnosis not present

## 2019-04-19 MED ORDER — LEVOTHYROXINE SODIUM 200 MCG PO TABS: 200.0000 ug | ORAL_TABLET | Freq: Every day | ORAL | 1 refills | Status: DC

## 2019-04-19 MED ORDER — CARVEDILOL 3.125 MG PO TABS: 3.1250 mg | ORAL_TABLET | Freq: Two times a day (BID) | ORAL | 1 refills | Status: DC

## 2019-04-19 MED ORDER — LOSARTAN POTASSIUM 50 MG PO TABS: ORAL_TABLET | ORAL | 1 refills | Status: DC

## 2019-04-19 MED ORDER — ATORVASTATIN CALCIUM 10 MG PO TABS: ORAL_TABLET | ORAL | 3 refills | Status: DC

## 2019-04-19 NOTE — Patient Instructions (Addendum)
  Continue inhaler and follow up with pulmonary to discuss shortness of breath in the heat. Return to the clinic or go to the nearest emergency room if any of your symptoms worsen or new symptoms occur.  Continue follow-up with cardiology, but blood pressure looks good today.  I will check some lab work.  Follow-up with me in 6 months.  Take care.    If you have lab work done today you will be contacted with your lab results within the next 2 weeks.  If you have not heard from [place] then please contact us. The fastest way to get your results is to register for My Chart.   IF you received an x-ray today, you will receive an invoice from [facility]. Please contact [facility] at [phone] with questions or concerns regarding your invoice.   IF you received labwork today, you will receive an invoice from [facility]. Please contact [facility] at [phone] with questions or concerns regarding your invoice.   Our billing staff will not be able to assist you with questions regarding bills from these companies.  You will be contacted with the lab results as soon as they are available. The fastest way to get your results is to activate your My Chart account. Instructions are located on the last page of this paperwork. If you have not heard from [place] regarding the results in 2 weeks, please contact this office.
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
## 2019-04-20 LAB — LIPID PANEL
Chol/HDL Ratio: 3.9 ratio (ref 0.0–5.0)
Cholesterol, Total: 128 mg/dL (ref 100–199)
HDL: 33 mg/dL — ABNORMAL LOW (ref >39)
LDL Calculated: 57 mg/dL (ref 0–99)
Triglycerides: 191 mg/dL — ABNORMAL HIGH (ref 0–149)
VLDL Cholesterol Cal: 38 mg/dL (ref 5–40)

## 2019-04-20 LAB — COMPREHENSIVE METABOLIC PANEL
ALT: 22 IU/L (ref 0–44)
AST: 15 IU/L (ref 0–40)
Albumin/Globulin Ratio: 1.7 (ref 1.2–2.2)
Albumin: 4.3 g/dL (ref 3.7–4.7)
Alkaline Phosphatase: 94 IU/L (ref 39–117)
BUN/Creatinine Ratio: 13 (ref 10–24)
BUN: 15 mg/dL (ref 8–27)
Bilirubin Total: 0.4 mg/dL (ref 0.0–1.2)
CO2: 22 mmol/L (ref 20–29)
Calcium: 9.3 mg/dL (ref 8.6–10.2)
Chloride: 104 mmol/L (ref 96–106)
Creatinine, Ser: 1.18 mg/dL (ref 0.76–1.27)
GFR calc Af Amer: 69 mL/min/{1.73_m2} (ref >59)
GFR calc non Af Amer: 60 mL/min/{1.73_m2} (ref >59)
Globulin, Total: 2.5 g/dL (ref 1.5–4.5)
Glucose: 119 mg/dL — ABNORMAL HIGH (ref 65–99)
Potassium: 4.5 mmol/L (ref 3.5–5.2)
Sodium: 143 mmol/L (ref 134–144)
Total Protein: 6.8 g/dL (ref 6.0–8.5)

## 2019-04-20 LAB — TSH: TSH: 2.5 u[IU]/mL (ref 0.450–4.500)

## 2019-04-24 ENCOUNTER — Ambulatory Visit: Payer: PPO | Admitting: Family Medicine

## 2019-05-02 ENCOUNTER — Encounter: Payer: Self-pay | Admitting: Radiology

## 2019-05-15 DIAGNOSIS — H25813 Combined forms of age-related cataract, bilateral: Secondary | ICD-10-CM | POA: Diagnosis not present

## 2019-05-20 ENCOUNTER — Other Ambulatory Visit: Payer: Self-pay

## 2019-05-20 ENCOUNTER — Ambulatory Visit (HOSPITAL_COMMUNITY): Payer: PPO | Attending: Cardiology

## 2019-05-20 DIAGNOSIS — I351 Nonrheumatic aortic (valve) insufficiency: Secondary | ICD-10-CM | POA: Insufficient documentation

## 2019-05-20 DIAGNOSIS — I2510 Atherosclerotic heart disease of native coronary artery without angina pectoris: Secondary | ICD-10-CM | POA: Insufficient documentation

## 2019-05-20 DIAGNOSIS — Z87891 Personal history of nicotine dependence: Secondary | ICD-10-CM | POA: Diagnosis not present

## 2019-05-20 DIAGNOSIS — I420 Dilated cardiomyopathy: Secondary | ICD-10-CM | POA: Diagnosis not present

## 2019-05-20 DIAGNOSIS — E785 Hyperlipidemia, unspecified: Secondary | ICD-10-CM | POA: Insufficient documentation

## 2019-05-20 DIAGNOSIS — I447 Left bundle-branch block, unspecified: Secondary | ICD-10-CM | POA: Diagnosis not present

## 2019-05-20 DIAGNOSIS — I429 Cardiomyopathy, unspecified: Secondary | ICD-10-CM | POA: Diagnosis not present

## 2019-05-20 DIAGNOSIS — J449 Chronic obstructive pulmonary disease, unspecified: Secondary | ICD-10-CM | POA: Diagnosis not present

## 2019-05-20 DIAGNOSIS — I110 Hypertensive heart disease with heart failure: Secondary | ICD-10-CM | POA: Diagnosis not present

## 2019-05-20 DIAGNOSIS — I509 Heart failure, unspecified: Secondary | ICD-10-CM | POA: Diagnosis not present

## 2019-05-31 DIAGNOSIS — H25812 Combined forms of age-related cataract, left eye: Secondary | ICD-10-CM | POA: Diagnosis not present

## 2019-06-28 DIAGNOSIS — H25811 Combined forms of age-related cataract, right eye: Secondary | ICD-10-CM | POA: Diagnosis not present
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
## 2019-07-26 ENCOUNTER — Ambulatory Visit: Payer: PPO | Admitting: Cardiovascular Disease

## 2019-07-26 ENCOUNTER — Other Ambulatory Visit: Payer: Self-pay

## 2019-07-26 DIAGNOSIS — I10 Essential (primary) hypertension: Secondary | ICD-10-CM

## 2019-07-26 MED ORDER — LOSARTAN POTASSIUM 100 MG PO TABS: 100.0000 mg | ORAL_TABLET | Freq: Every day | ORAL | 3 refills | Status: DC

## 2019-07-26 NOTE — Patient Instructions (Addendum)
Medication Instructions:  Your physician has recommended you make the following change in your medication:  1-INCREASE Losartan 100 mg by mouth daily.  *If you need a refill on your cardiac medications before your next appointment, please call your pharmacy*  Lab Work:  If you have labs (blood work) drawn today and your tests are completely normal, you will receive your results only by: [name] MyChart Message (if you have MyChart) OR . A paper copy in the mail If you have any lab test that is abnormal or we need to change your treatment, we will call you to review the results.  Testing/Procedures: None ordered today.  Follow-Up: At [facility], you and your health needs are our priority.  As part of our continuing mission to provide you with exceptional heart care, we have created designated Provider Care Teams.  These Care Teams include your primary Cardiologist (physician) and Advanced Practice Providers (APPs -  Physician Assistants and Nurse Practitioners) who all work together to provide you with the care you need, when you need it.  Your next appointment:   3 months  The format for your next appointment:   In Person  Provider:   You may see Jenkins Rouge, MD or one of the following Advanced Practice Providers on your designated Care Team:    Truitt Merle, NP  Cecilie Kicks, NP  Kathyrn Drown, NP

## 2019-07-31 ENCOUNTER — Ambulatory Visit: Payer: PPO | Admitting: Pulmonary Disease

## 2019-07-31 ENCOUNTER — Other Ambulatory Visit: Payer: Self-pay

## 2019-07-31 ENCOUNTER — Encounter: Payer: Self-pay | Admitting: Pulmonary Disease

## 2019-07-31 DIAGNOSIS — F17200 Nicotine dependence, unspecified, uncomplicated: Secondary | ICD-10-CM

## 2019-07-31 DIAGNOSIS — J432 Centrilobular emphysema: Secondary | ICD-10-CM | POA: Diagnosis not present

## 2019-07-31 MED ORDER — ANORO ELLIPTA 62.5-25 MCG/INH IN AEPB: 1.0000 | INHALATION_SPRAY | Freq: Every day | RESPIRATORY_TRACT | 0 refills | Status: DC

## 2019-07-31 NOTE — Patient Instructions (Signed)
Sample of Anoro  Finish taking your Breo and then try to sample and report back if this works better so that we can provide you with prescription

## 2019-07-31 NOTE — Progress Notes (Signed)
   Subjective:    Patient ID: Arthur Quince., male    DOB: 02-Feb-1941, 78 y.o.   MRN: [id number]  HPI  78 yo smoker , followed for COPD and multiple pulmonary nodules Has CHF (EF 35 % )   He used to be on [name] and due to insurance issues was changed to [employer].  He reports increased dyspnea during the summer months but feels better now that the temperature has gone down. He continues to raise, modified vintage car, they call him " the legend" or "old man" He denies wheezing or productive sputum  Flu shot is up-to-date  He smokes 1 to 2 cigarettes on occasion   Significant tests/ events reviewed  2D Echo 05/2016 Moderate to severe global reduction in LV function; mild LVH; grade 1 diastolic dysfunction with elevated LV filling pressure; mild biatrial enlargement; trace AI, MR and TR. EF 30-35%  Spirometry 2016 >FEV1 66% , ratio 63.  CT chest 09/2015-nodule stable or decreased compared to 02/2015 Review of Systems neg for any significant sore throat, dysphagia, itching, sneezing, nasal congestion or excess/ purulent secretions, fever, chills, sweats, unintended wt loss, pleuritic or exertional cp, hempoptysis, orthopnea pnd or change in chronic leg swelling. Also denies presyncope, palpitations, heartburn, abdominal pain, nausea, vomiting, diarrhea or change in bowel or urinary habits, dysuria,hematuria, rash, arthralgias, visual complaints, headache, numbness weakness or ataxia.     Objective:   Physical Exam  Gen. Pleasant, well-nourished, in no distress ENT - no thrush, no pallor/icterus,no post nasal drip Neck: No JVD, no thyromegaly, no carotid bruits Lungs: no use of accessory muscles, no dullness to percussion, clear without rales or rhonchi  Cardiovascular: Rhythm regular, heart sounds  normal, no murmurs or gallops, no peripheral edema Musculoskeletal: No deformities, no cyanosis or clubbing        Assessment & Plan:

## 2019-07-31 NOTE — Assessment & Plan Note (Signed)
Smoking cessation again emphasized 

## 2019-07-31 NOTE — Addendum Note (Signed)
Addended by: Amado Coe on: 07/31/2019 10:41 AM   Modules accepted: Orders

## 2019-07-31 NOTE — Assessment & Plan Note (Signed)
Appears stable -still has some residual symptoms of Breo Sample of Anoro  Finish taking your Breo and then try to sample and report back if this works better so that we can provide you with prescription  Flu shot is up-to-date

## 2019-08-05 ENCOUNTER — Other Ambulatory Visit: Payer: Self-pay | Admitting: *Deleted

## 2019-08-05 DIAGNOSIS — Z122 Encounter for screening for malignant neoplasm of respiratory organs: Secondary | ICD-10-CM

## 2019-08-05 DIAGNOSIS — Z87891 Personal history of nicotine dependence: Secondary | ICD-10-CM

## 2019-08-21 ENCOUNTER — Ambulatory Visit (INDEPENDENT_AMBULATORY_CARE_PROVIDER_SITE_OTHER): Payer: PPO | Admitting: Acute Care

## 2019-08-21 ENCOUNTER — Other Ambulatory Visit: Payer: Self-pay

## 2019-08-21 ENCOUNTER — Encounter: Payer: Self-pay | Admitting: Acute Care

## 2019-08-21 ENCOUNTER — Ambulatory Visit
Admission: RE | Admit: 2019-08-21 | Discharge: 2019-08-21 | Disposition: A | Discharge status: Home / Self Care | Payer: PPO | Source: Ambulatory Visit | Attending: Acute Care | Admitting: Acute Care

## 2019-08-21 VITALS — BP 138/80 | HR 76 | Temp 97.7°F | Ht 72.0 in | Wt 243.6 lb

## 2019-08-21 DIAGNOSIS — Z87891 Personal history of nicotine dependence: Secondary | ICD-10-CM

## 2019-08-21 DIAGNOSIS — Z122 Encounter for screening for malignant neoplasm of respiratory organs: Secondary | ICD-10-CM

## 2019-08-21 NOTE — Progress Notes (Signed)
Shared Decision Making Visit Lung Cancer Screening Program [phone])   Eligibility:  Age 78 y.o.  Pack Years Smoking History Calculation 52 (# packs/per year x # years smoked)  Recent History of coughing up blood  no  Unexplained weight loss? no ( >Than 15 pounds within the last 6 months )  Prior History Lung / other cancer no (Diagnosis within the last 5 years already requiring surveillance chest CT Scans).  Smoking Status Former Smoker  Former Smokers: Years since quit: < 1 year  Quit Date: 10/2018  Visit Components:  Discussion included one or more decision making aids. yes  Discussion included risk/benefits of screening. yes  Discussion included potential follow up diagnostic testing for abnormal scans. yes  Discussion included meaning and risk of over diagnosis. yes  Discussion included meaning and risk of False Positives. yes  Discussion included meaning of total radiation exposure. yes  Counseling Included:  Importance of adherence to annual lung cancer LDCT screening. yes  Impact of comorbidities on ability to participate in the program. yes  Ability and willingness to under diagnostic treatment. yes  Smoking Cessation Counseling:  Current Smokers:   Discussed importance of smoking cessation. NA  Information about tobacco cessation classes and interventions provided to patient. yes  Patient provided with "ticket" for LDCT Scan. yes  Symptomatic Patient. no  Counseling  Diagnosis Code: Tobacco Use Z72.0  Asymptomatic Patient yes  Counseling (Intermediate counseling: > three minutes counseling) [id number]  Former Smokers:   Discussed the importance of maintaining cigarette abstinence. yes  Diagnosis Code: Personal History of Nicotine Dependence. [id number]  Information about tobacco cessation classes and interventions provided to patient. Yes  Patient provided with "ticket" for LDCT Scan. yes  Written Order for Lung Cancer Screening with LDCT  placed in Epic. Yes (CT Chest Lung Cancer Screening Low Dose W/O CM) [id number] Z12.2-Screening of respiratory organs Z87.891-Personal history of nicotine dependence  BP 138/80 (BP Location: Right Arm)   Pulse 76   Temp 97.7 F (36.5 C) (Oral)   Ht 6' (1.829 m)   Wt 243 lb 9.6 oz (110.5 kg)   SpO2 95%   BMI 33.04 kg/m      Magdalen Spatz, NP 08/21/2019 10:59 AM

## 2019-08-21 NOTE — Patient Instructions (Signed)
Thank you for participating in the [facility]. It was our pleasure to meet you today. We will call you with the results of your scan within the next few days. Your scan will be assigned a Lung RADS category score by the physicians reading the scans.  This Lung RADS score determines follow up scanning.  See below for description of categories, and follow up screening recommendations. We will be in touch to schedule your follow up screening annually or based on recommendations of our providers. We will fax a copy of your scan results to your Primary Care Physician, or the physician who referred you to the program, to ensure they have the results. Please call the office if you have any questions or concerns regarding your scanning experience or results.  Our office number is [phone]. Please speak with Denise Phelps, RN. She is our Lung Cancer Screening RN. If she is unavailable when you call, please have the office staff send her a message. She will return your call at her earliest convenience. Remember, if your scan is normal, we will scan you annually as long as you continue to meet the criteria for the program. (Age 55-77, Current smoker or smoker who has quit within the last 15 years). If you are a smoker, remember, quitting is the single most powerful action that you can take to decrease your risk of lung cancer and other pulmonary, breathing related problems. We know quitting is hard, and we are here to help.  Please let us know if there is anything we can do to help you meet your goal of quitting. If you are a former smoker, congratulations. We are proud of you! Remain smoke free! Remember you can refer friends or family members through the number above.  We will screen them to make sure they meet criteria for the program. Thank you for helping us take better care of you by participating in Lung Screening.  Lung RADS Categories:  Lung RADS 1: no nodules  or definitely non-concerning nodules.  Recommendation is for a repeat annual scan in 12 months.  Lung RADS 2:  nodules that are non-concerning in appearance and behavior with a very low likelihood of becoming an active cancer. Recommendation is for a repeat annual scan in 12 months.  Lung RADS 3: nodules that are probably non-concerning , includes nodules with a low likelihood of becoming an active cancer.  Recommendation is for a 6-month repeat screening scan. Often noted after an upper respiratory illness. We will be in touch to make sure you have no questions, and to schedule your 6-month scan.  Lung RADS 4 A: nodules with concerning findings, recommendation is most often for a follow up scan in 3 months or additional testing based on our provider's assessment of the scan. We will be in touch to make sure you have no questions and to schedule the recommended 3 month follow up scan.  Lung RADS 4 B:  indicates findings that are concerning. We will be in touch with you to schedule additional diagnostic testing based on our provider's  assessment of the scan.   

## 2019-09-04 ENCOUNTER — Telehealth: Payer: Self-pay | Admitting: *Deleted

## 2019-09-04 NOTE — Telephone Encounter (Signed)
Pt informed of CT results per Eric Form, NP.  PT verbalized understanding.  Copy sent to PCP.  Pt will no longer qualify for lung cancer screening due to age 78 at time of next scan.

## 2019-09-11 ENCOUNTER — Telehealth: Payer: Self-pay | Admitting: Pulmonary Disease

## 2019-09-11 MED ORDER — ANORO ELLIPTA 62.5-25 MCG/INH IN AEPB: 1.0000 | INHALATION_SPRAY | Freq: Every day | RESPIRATORY_TRACT | 3 refills | Status: DC

## 2019-09-11 NOTE — Telephone Encounter (Signed)
Spoke with pt and advised rx for Anoro was sent to pharmacy. Nothing further is needed.
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
## 2019-10-22 ENCOUNTER — Ambulatory Visit: Payer: PPO | Admitting: Family Medicine

## 2019-10-23 ENCOUNTER — Ambulatory Visit (INDEPENDENT_AMBULATORY_CARE_PROVIDER_SITE_OTHER): Payer: PPO | Admitting: Family Medicine

## 2019-10-23 ENCOUNTER — Encounter: Payer: Self-pay | Admitting: Family Medicine

## 2019-10-23 ENCOUNTER — Other Ambulatory Visit: Payer: Self-pay

## 2019-10-23 VITALS — BP 148/82 | HR 65 | Temp 98.9°F | Ht 72.0 in | Wt 250.0 lb

## 2019-10-23 DIAGNOSIS — I10 Essential (primary) hypertension: Secondary | ICD-10-CM

## 2019-10-23 DIAGNOSIS — J449 Chronic obstructive pulmonary disease, unspecified: Secondary | ICD-10-CM

## 2019-10-23 DIAGNOSIS — E039 Hypothyroidism, unspecified: Secondary | ICD-10-CM

## 2019-10-23 DIAGNOSIS — E785 Hyperlipidemia, unspecified: Secondary | ICD-10-CM | POA: Diagnosis not present

## 2019-10-23 MED ORDER — ATORVASTATIN CALCIUM 10 MG PO TABS: ORAL_TABLET | ORAL | 1 refills | Status: DC

## 2019-10-23 MED ORDER — LEVOTHYROXINE SODIUM 200 MCG PO TABS: 200.0000 ug | ORAL_TABLET | Freq: Every day | ORAL | 1 refills | Status: DC

## 2019-10-23 MED ORDER — CARVEDILOL 3.125 MG PO TABS: 3.1250 mg | ORAL_TABLET | Freq: Two times a day (BID) | ORAL | 1 refills | Status: DC

## 2019-10-23 NOTE — Patient Instructions (Addendum)
   Keep a record of your blood pressures outside of the office to discuss on phone visit in next 3 weeks. No changes for now.   Watch diet - weight has increased (which can increase blood pressure).   Thanks for coming in today.   If you have lab work done today you will be contacted with your lab results within the next 2 weeks.  If you have not heard from [place] then please contact us. The fastest way to get your results is to register for My Chart.   IF you received an x-ray today, you will receive an invoice from [facility]. Please contact [facility] at [phone] with questions or concerns regarding your invoice.   IF you received labwork today, you will receive an invoice from [facility]. Please contact [facility] at [phone] with questions or concerns regarding your invoice.   Our billing staff will not be able to assist you with questions regarding bills from these companies.  You will be contacted with the lab results as soon as they are available. The fastest way to get your results is to activate your My Chart account. Instructions are located on the last page of this paperwork. If you have not heard from [place] regarding the results in 2 weeks, please contact this office.
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
## 2019-10-24 LAB — COMPREHENSIVE METABOLIC PANEL
ALT: 23 IU/L (ref 0–44)
AST: 20 IU/L (ref 0–40)
Albumin/Globulin Ratio: 1.4 (ref 1.2–2.2)
Albumin: 4.2 g/dL (ref 3.7–4.7)
Alkaline Phosphatase: 112 IU/L (ref 39–117)
BUN/Creatinine Ratio: 15 (ref 10–24)
BUN: 14 mg/dL (ref 8–27)
Bilirubin Total: 0.6 mg/dL (ref 0.0–1.2)
CO2: 26 mmol/L (ref 20–29)
Calcium: 9.7 mg/dL (ref 8.6–10.2)
Chloride: 103 mmol/L (ref 96–106)
Creatinine, Ser: 0.95 mg/dL (ref 0.76–1.27)
GFR calc Af Amer: 89 mL/min/{1.73_m2} (ref >59)
GFR calc non Af Amer: 77 mL/min/{1.73_m2} (ref >59)
Globulin, Total: 2.9 g/dL (ref 1.5–4.5)
Glucose: 103 mg/dL — ABNORMAL HIGH (ref 65–99)
Potassium: 4.4 mmol/L (ref 3.5–5.2)
Sodium: 142 mmol/L (ref 134–144)
Total Protein: 7.1 g/dL (ref 6.0–8.5)

## 2019-10-24 LAB — LIPID PANEL
Chol/HDL Ratio: 3.8 ratio (ref 0.0–5.0)
Cholesterol, Total: 136 mg/dL (ref 100–199)
HDL: 36 mg/dL — ABNORMAL LOW (ref >39)
LDL Chol Calc (NIH): 79 mg/dL (ref 0–99)
Triglycerides: 115 mg/dL (ref 0–149)
VLDL Cholesterol Cal: 21 mg/dL (ref 5–40)

## 2019-10-24 LAB — TSH: TSH: 1.2 u[IU]/mL (ref 0.450–4.500)

## 2019-10-28 ENCOUNTER — Encounter: Payer: Self-pay | Admitting: Radiology

## 2019-10-29 ENCOUNTER — Ambulatory Visit: Payer: PPO | Admitting: Cardiovascular Disease

## 2019-10-29 ENCOUNTER — Encounter: Payer: Self-pay | Admitting: Cardiovascular Disease

## 2019-10-29 ENCOUNTER — Other Ambulatory Visit: Payer: Self-pay

## 2019-10-29 VITALS — BP 142/76 | HR 56 | Ht 72.0 in | Wt 254.0 lb

## 2019-10-29 DIAGNOSIS — I10 Essential (primary) hypertension: Secondary | ICD-10-CM | POA: Diagnosis not present

## 2019-10-29 NOTE — Patient Instructions (Addendum)
Medication Instructions:   *If you need a refill on your cardiac medications before your next appointment, please call your pharmacy*  Lab Work:  If you have labs (blood work) drawn today and your tests are completely normal, you will receive your results only by: . MyChart Message (if you have MyChart) OR . A paper copy in the mail If you have any lab test that is abnormal or we need to change your treatment, we will call you to review the results.  Testing/Procedures: None ordered today.   Follow-Up: At [facility], you and your health needs are our priority.  As part of our continuing mission to provide you with exceptional heart care, we have created designated Provider Care Teams.  These Care Teams include your primary Cardiologist (physician) and Advanced Practice Providers (APPs -  Physician Assistants and Nurse Practitioners) who all work together to provide you with the care you need, when you need it.  Your next appointment:   12 months  The format for your next appointment:   In Person  Provider:   You may see Peter Nishan, MD or one of the following Advanced Practice Providers on your designated Care Team:    Lori Gerhardt, NP  Laura Ingold, NP  Jill McDaniel, NP    

## 2019-11-13 ENCOUNTER — Telehealth (INDEPENDENT_AMBULATORY_CARE_PROVIDER_SITE_OTHER): Payer: PPO | Admitting: Family Medicine

## 2019-11-13 ENCOUNTER — Other Ambulatory Visit: Payer: Self-pay

## 2019-11-13 ENCOUNTER — Encounter: Payer: Self-pay | Admitting: Family Medicine

## 2019-11-13 VITALS — BP 149/80 | HR 66 | Ht 72.0 in | Wt 250.0 lb

## 2019-11-13 DIAGNOSIS — I10 Essential (primary) hypertension: Secondary | ICD-10-CM

## 2019-11-13 NOTE — Patient Instructions (Addendum)
  Cut back on any added salt, as well as frozen food or fast food as much as possible as salt intake can increase blood pressure.  No change in medications for now.  If blood pressure remains over 150/90, even with change in sodium intake, recommend follow-up with myself or [name].  Please let me know if there are questions.   If you have lab work done today you will be contacted with your lab results within the next 2 weeks.  If you have not heard from [place] then please contact us. The fastest way to get your results is to register for My Chart.   IF you received an x-ray today, you will receive an invoice from [facility]. Please contact [facility] at [phone] with questions or concerns regarding your invoice.   IF you received labwork today, you will receive an invoice from [facility]. Please contact [facility] at [phone] with questions or concerns regarding your invoice.   Our billing staff will not be able to assist you with questions regarding bills from these companies.  You will be contacted with the lab results as soon as they are available. The fastest way to get your results is to activate your My Chart account. Instructions are located on the last page of this paperwork. If you have not heard from [place] regarding the results in 2 weeks, please contact this office.
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
## 2019-12-05 ENCOUNTER — Other Ambulatory Visit: Payer: Self-pay | Admitting: Family Medicine

## 2019-12-05 DIAGNOSIS — E039 Hypothyroidism, unspecified: Secondary | ICD-10-CM

## 2019-12-05 NOTE — Telephone Encounter (Signed)
Requested Prescriptions  Pending Prescriptions Disp Refills  . levothyroxine (SYNTHROID) 200 MCG tablet [Pharmacy Med Name: LEVOTHYROXINE SOD 0.2MG (200MCG) TAB] 90 tablet 1    Sig: TAKE 1 TABLET(200 MCG) BY MOUTH DAILY     Endocrinology:  Hypothyroid Agents Failed - 12/05/2019  9:51 AM      Failed - TSH needs to be rechecked within 3 months after an abnormal result. Refill until TSH is due.      Passed - TSH in normal range and within 360 days    TSH  Date Value Ref Range Status  10/23/2019 1.200 0.450 - 4.500 uIU/mL Final         Passed - Valid encounter within last 12 months    Recent Outpatient Visits          3 weeks ago Essential hypertension   [facility] [place], MD   1 month ago Chronic obstructive pulmonary disease, unspecified COPD type [facility])   [facility] [place], Ranell Patrick, MD   7 months ago Chronic obstructive pulmonary disease, unspecified COPD type [facility])   [facility] [place] [facility], Ranell Patrick, MD   8 months ago Medicare annual wellness visit, subsequent   [facility], Ranell Patrick, MD   1 year ago Hyperlipidemia, unspecified hyperlipidemia type   [facility], Ranell Patrick, MD

## 2019-12-27 ENCOUNTER — Telehealth: Payer: Self-pay | Admitting: Family Medicine

## 2019-12-27 ENCOUNTER — Other Ambulatory Visit: Payer: Self-pay

## 2019-12-27 ENCOUNTER — Encounter (HOSPITAL_COMMUNITY): Payer: Self-pay | Admitting: Pediatrics

## 2019-12-27 ENCOUNTER — Emergency Department (HOSPITAL_COMMUNITY): Payer: PPO

## 2019-12-27 ENCOUNTER — Emergency Department (HOSPITAL_COMMUNITY)
Admission: EM | Admit: 2019-12-27 | Discharge: 2019-12-27 | Disposition: A | Discharge status: Home / Self Care | Payer: PPO | Attending: Emergency Medicine | Admitting: Emergency Medicine

## 2019-12-27 DIAGNOSIS — R0789 Other chest pain: Secondary | ICD-10-CM | POA: Insufficient documentation

## 2019-12-27 DIAGNOSIS — N200 Calculus of kidney: Secondary | ICD-10-CM | POA: Diagnosis not present

## 2019-12-27 DIAGNOSIS — R5383 Other fatigue: Secondary | ICD-10-CM | POA: Diagnosis not present

## 2019-12-27 DIAGNOSIS — R519 Headache, unspecified: Secondary | ICD-10-CM | POA: Diagnosis not present

## 2019-12-27 DIAGNOSIS — R079 Chest pain, unspecified: Secondary | ICD-10-CM | POA: Diagnosis not present

## 2019-12-27 DIAGNOSIS — R531 Weakness: Secondary | ICD-10-CM | POA: Diagnosis present

## 2019-12-27 DIAGNOSIS — I10 Essential (primary) hypertension: Secondary | ICD-10-CM | POA: Diagnosis not present

## 2019-12-27 LAB — BASIC METABOLIC PANEL
Anion gap: 9 (ref 5–15)
BUN: 15 mg/dL (ref 8–23)
CO2: 27 mmol/L (ref 22–32)
Calcium: 9.2 mg/dL (ref 8.9–10.3)
Chloride: 106 mmol/L (ref 98–111)
Creatinine, Ser: 1.07 mg/dL (ref 0.61–1.24)
GFR calc Af Amer: >60 mL/min (ref >60)
GFR calc non Af Amer: >60 mL/min (ref >60)
Glucose, Bld: 120 mg/dL — ABNORMAL HIGH (ref 70–99)
Potassium: 3.7 mmol/L (ref 3.5–5.1)
Sodium: 142 mmol/L (ref 135–145)

## 2019-12-27 LAB — CBC
HCT: 43.9 % (ref 39.0–52.0)
Hemoglobin: 13.6 g/dL (ref 13.0–17.0)
MCH: 28.6 pg (ref 26.0–34.0)
MCHC: 31 g/dL (ref 30.0–36.0)
MCV: 92.4 fL (ref 80.0–100.0)
Platelets: 239 10*3/uL (ref 150–400)
RBC: 4.75 MIL/uL (ref 4.22–5.81)
RDW: 12.9 % (ref 11.5–15.5)
WBC: 8.8 10*3/uL (ref 4.0–10.5)
nRBC: 0 % (ref 0.0–0.2)

## 2019-12-27 LAB — URINALYSIS, ROUTINE W REFLEX MICROSCOPIC
Bilirubin Urine: NEGATIVE
Glucose, UA: NEGATIVE mg/dL
Hgb urine dipstick: NEGATIVE
Ketones, ur: NEGATIVE mg/dL
Leukocytes,Ua: NEGATIVE
Nitrite: NEGATIVE
Protein, ur: NEGATIVE mg/dL
Specific Gravity, Urine: 1.043 — ABNORMAL HIGH (ref 1.005–1.030)
pH: 5 (ref 5.0–8.0)

## 2019-12-27 LAB — CBG MONITORING, ED: Glucose-Capillary: 97 mg/dL (ref 70–99)

## 2019-12-27 LAB — TROPONIN I (HIGH SENSITIVITY)
Troponin I (High Sensitivity): 8 ng/L (ref <18)
Troponin I (High Sensitivity): 6 ng/L (ref <18)

## 2019-12-27 MED ORDER — SODIUM CHLORIDE 0.9% FLUSH: 3.0000 mL | Freq: Once | INTRAVENOUS | Status: DC

## 2019-12-27 MED ORDER — IOHEXOL 350 MG/ML SOLN
100.0000 mL | Freq: Once | INTRAVENOUS | Status: AC | PRN
  Administered 2019-12-27: 100 mL via INTRAVENOUS
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
## 2019-12-27 NOTE — ED Notes (Signed)
Pt was discharged from the ED. Pt read and understood discharge paperwork. Pt had vital signs completed. Pt conscious, breathing, and A&Ox4. No distress noted. Pt speaking in complete sentences. Pt ambulated out of the ED with a smooth and steady gait. E-signature not available.  

## 2019-12-27 NOTE — ED Triage Notes (Signed)
C/o generalized weakness the last few days; concern for high blood pressure reading on personal portable machine. Patient c/o reflux and some nausea as well. Endorsed hx of COPD

## 2019-12-27 NOTE — Telephone Encounter (Signed)
error 

## 2020-01-01 ENCOUNTER — Telehealth: Payer: Self-pay | Admitting: Cardiovascular Disease

## 2020-01-01 NOTE — Telephone Encounter (Signed)
New message:    Patient calling to a follow up with [name] but do not have anything patient was in the hospital they told him to do a follow up. Please call patient back.

## 2020-01-01 NOTE — Telephone Encounter (Signed)
Called patient back. Per Discharge from hospital patient needs to follow up with [name]. Made patient an appointment for tomorrow since [name] is DOD.

## 2020-01-02 ENCOUNTER — Ambulatory Visit: Payer: PPO | Admitting: Cardiovascular Disease

## 2020-01-02 ENCOUNTER — Other Ambulatory Visit: Payer: Self-pay

## 2020-01-02 ENCOUNTER — Encounter: Payer: Self-pay | Admitting: Cardiovascular Disease

## 2020-01-02 VITALS — BP 158/78 | HR 71 | Ht 72.0 in | Wt 248.0 lb

## 2020-01-02 DIAGNOSIS — I5043 Acute on chronic combined systolic (congestive) and diastolic (congestive) heart failure: Secondary | ICD-10-CM

## 2020-01-02 DIAGNOSIS — I10 Essential (primary) hypertension: Secondary | ICD-10-CM

## 2020-01-02 DIAGNOSIS — R55 Syncope and collapse: Secondary | ICD-10-CM | POA: Diagnosis not present

## 2020-01-02 MED ORDER — CARVEDILOL 6.25 MG PO TABS: 6.2500 mg | ORAL_TABLET | Freq: Two times a day (BID) | ORAL | 3 refills | Status: DC

## 2020-01-02 NOTE — Patient Instructions (Addendum)
Medication Instructions:  Your physician has recommended you make the following change in your medication:  1-INCREASE Carvidalol 6.25 mg by mouth twice daily.  *If you need a refill on your cardiac medications before your next appointment, please call your pharmacy*  Lab Work: If you have labs (blood work) drawn today and your tests are completely normal, you will receive your results only by: [name] MyChart Message (if you have MyChart) OR . A paper copy in the mail If you have any lab test that is abnormal or we need to change your treatment, we will call you to review the results.  Testing/Procedures: Your physician has requested that you have an echocardiogram. Echocardiography is a painless test that uses sound waves to create images of your heart. It provides your doctor with information about the size and shape of your heart and how well your heart's chambers and valves are working. This procedure takes approximately one hour. There are no restrictions for this procedure.  Follow-Up: At [facility], you and your health needs are our priority.  As part of our continuing mission to provide you with exceptional heart care, we have created designated Provider Care Teams.  These Care Teams include your primary Cardiologist (physician) and Advanced Practice Providers (APPs -  Physician Assistants and Nurse Practitioners) who all work together to provide you with the care you need, when you need it.  We recommend signing up for the patient portal called "MyChart".  Sign up information is provided on this After Visit Summary.  MyChart is used to connect with patients for Virtual Visits (Telemedicine).  Patients are able to view lab/test results, encounter notes, upcoming appointments, etc.  Non-urgent messages can be sent to your provider as well.   To learn more about what you can do with MyChart, go to [email or web address].    You have been referred to [facility] in 3  weeks.  Your next appointment:   3 month(s)  The format for your next appointment:   In Person  Provider:   You may see Jenkins Rouge, MD or one of the following Advanced Practice Providers on your designated Care Team:    Truitt Merle, NP  Cecilie Kicks, NP  Kathyrn Drown, NP
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
## 2020-01-17 ENCOUNTER — Other Ambulatory Visit: Payer: Self-pay | Admitting: Pulmonary Disease

## 2020-01-20 ENCOUNTER — Other Ambulatory Visit (HOSPITAL_COMMUNITY): Payer: PPO

## 2020-01-23 ENCOUNTER — Ambulatory Visit (INDEPENDENT_AMBULATORY_CARE_PROVIDER_SITE_OTHER): Payer: PPO | Admitting: Pharmacist

## 2020-01-23 ENCOUNTER — Other Ambulatory Visit: Payer: Self-pay

## 2020-01-23 ENCOUNTER — Telehealth: Payer: Self-pay | Admitting: Pharmacist

## 2020-01-23 ENCOUNTER — Ambulatory Visit (HOSPITAL_COMMUNITY): Payer: PPO | Attending: Cardiovascular Disease

## 2020-01-23 VITALS — BP 168/84 | HR 59

## 2020-01-23 DIAGNOSIS — I5043 Acute on chronic combined systolic (congestive) and diastolic (congestive) heart failure: Secondary | ICD-10-CM | POA: Diagnosis not present

## 2020-01-23 DIAGNOSIS — R55 Syncope and collapse: Secondary | ICD-10-CM

## 2020-01-23 DIAGNOSIS — E7800 Pure hypercholesterolemia, unspecified: Secondary | ICD-10-CM | POA: Diagnosis not present

## 2020-01-23 DIAGNOSIS — I700 Atherosclerosis of aorta: Secondary | ICD-10-CM | POA: Insufficient documentation

## 2020-01-23 DIAGNOSIS — I10 Essential (primary) hypertension: Secondary | ICD-10-CM | POA: Insufficient documentation

## 2020-01-23 MED ORDER — LOSARTAN POTASSIUM-HCTZ 100-25 MG PO TABS: 1.0000 | ORAL_TABLET | Freq: Every day | ORAL | 3 refills | Status: DC

## 2020-01-23 MED ORDER — ATORVASTATIN CALCIUM 40 MG PO TABS: 40.0000 mg | ORAL_TABLET | Freq: Every day | ORAL | 3 refills | Status: DC

## 2020-01-23 MED ORDER — ENTRESTO 49-51 MG PO TABS: 1.0000 | ORAL_TABLET | Freq: Two times a day (BID) | ORAL | 5 refills | Status: DC

## 2020-01-23 NOTE — Telephone Encounter (Addendum)
Echo from today revealed reduced EF 35-40%. Will change medication plan and instead of starting losartan-HCTZ, will start Entresto 49-51mg  BID. Will continue carvedilol 6.25mg  BID (HR was in upper 50s today). Pt had not been on any diuretics previously and should see modest diuresis with mid dose Entresto. Will plan to discuss spironolactone or Lasix at follow up visit if Arthur [name] is not adequately controlling his swelling.  Pt is aware of change in medication plan. Sent in [place] 1 month free card with rx, follow up prescriptions will cost $45 which pt is ok with. Pharmacy stated prior Josem Kaufmann is needed and they will fax over a form to complete. In the mean time, they can still process first month free.

## 2020-01-23 NOTE — Progress Notes (Signed)
Patient ID: Arthur [name].                 DOB: 01-Oct-1941                      MRN: [id number]     HPI: Arthur [name]. is a 79 y.o. male referred by [name] to HTN clinic. PMH is significant for nonischemic DCM, EF improved to 50-55% on echo August 2020, cath 09/2016 showed no significant CAD, however 12/2019 CT angio revealed aortic atherosclerosis, HTN, HLD, LBBB, GERD, COPD, former tobacco abuse, pulmonary nodules, and migraines. At last visit with Dr Johnsie Cancel on 01/02/20, pt reported more headaches and BP was elevated at home and in office at 158/78. His carvedilol was increased to 6.25mg  BID and he presents today for follow up.  Pt presents today in good spirits. He just had an echo done today. He is tolerating his higher dose of carvedilol well. He denies dizziness, falls, no headaches in the past few weeks, no headaches. Notes swelling in his legs that worsens throughout the day. 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 He uses a wrist cuff to check his BP at home when he is laying in bed - BP yesterday was 180s/80s, no headache at that time. Denies NSAID use. Minimal exercise, not following heart healthy diet.  Current HTN meds: carvedilol 6.25mg  BID, losartan 100mg  daily  BP goal: <130/64mmHg  Family History: The patient's family history includes Colon cancer in his brother; Diabetes in his brother; Heart attack in his father; Skin cancer in his brother.  Social History: Former smoker 1 PPD for 52 years, quti in 2017, rare alcohol use, denies drug use.  Diet: Yesterday - ate BLT with a lot of mayo, chicken salad sandwich, corn with butter, creamsicle. 2 bottles of regular coke and 1 bottle of water each day. Trying to cut back on salt and soft drinks. Used to drink a Hospital doctor a few days a week.  Exercise: Minimal  Home BP readings: 180/80s, uses wrist cuff and is laying down when he checks his BP  Wt Readings from Last 3 Encounters:  01/02/20 248 lb (112.5 kg)  11/13/19 250 lb (113.4  kg)  10/29/19 254 lb (115.2 kg)   BP Readings from Last 3 Encounters:  01/02/20 (!) 158/78  12/27/19 (!) 152/62  11/13/19 (!) 149/80   Pulse Readings from Last 3 Encounters:  01/02/20 71  12/27/19 82  11/13/19 66    Renal function: CrCl cannot be calculated (Patient's most recent lab result is older than the maximum 21 days allowed.).  Past Medical History:  Diagnosis Date  . Arthritis   . Bifascicular block    a. 1st degree AVB/LBBB.  [name] Chronic systolic CHF (congestive heart failure) ([place])   . COPD (chronic obstructive pulmonary disease) ([place])   . ED (erectile dysfunction)   . Essential hypertension, benign    chris guess  pcp  . GERD (gastroesophageal reflux disease)   . Hypercholesterolemia   . Kidney stone    renal stone  . LBBB (left bundle branch block) 10/2007  . Migraines   . Mild dietary indigestion   . NICM (nonischemic cardiomyopathy) ([place])   . Nonischemic cardiomyopathy ([place])   . Prostate cancer ([place])   . Reflux esophagitis   . Shortness of breath dyspnea    W/ EXERTION   . Spider bite   . Thyroid cancer ([place])   . Tobacco use disorder   . Unspecified  hypothyroidism     Current Outpatient Medications on File Prior to Visit  Medication Sig Dispense Refill  . ANORO ELLIPTA 62.5-25 MCG/INH AEPB INHALE 1 PUFF INTO THE LUNGS DAILY 60 each 3  . atorvastatin (LIPITOR) 10 MG tablet TAKE 1 TABLET BY MOUTH EVERY DAY AT 6 PM (Patient taking differently: Take 10 mg by mouth every evening. ) 90 tablet 1  . carvedilol (COREG) 6.25 MG tablet Take 1 tablet (6.25 mg total) by mouth 2 (two) times daily with a meal. 180 tablet 3  . levothyroxine (SYNTHROID) 200 MCG tablet TAKE 1 TABLET(200 MCG) BY MOUTH DAILY (Patient taking differently: Take 200 mcg by mouth daily. ) 90 tablet 1  . losartan (COZAAR) 100 MG tablet Take 100 mg by mouth daily.    . pantoprazole (PROTONIX) 40 MG tablet TAKE 1 TABLET(40 MG) BY MOUTH DAILY BEFORE BREAKFAST (Patient taking differently: Take 40  mg by mouth daily. ) 90 tablet 3   No current facility-administered medications on file prior to visit.    Allergies  Allergen Reactions  . Doxycycline Other (See Comments)    Esophagitis, severe gi bleed     Assessment/Plan:  1. Hypertension - BP remains elevated above goal <130/29mmHg. Will switch losartan 100mg  daily to combo pill losartan-HCTZ 100-25mg  once daily. This should help with both BP and swelling in his legs. Continue carvedilol 6.25mg  BID. Encouraged pt to cut back on energy drinks and to monitor his BP while sitting instead of laying down. He will bring home readings to follow up visit in 4 weeks. Will check BMET at that time.  2, Hyperlipidemia - LDL 79 on atorvastatin 10mg  daily above goal < 70 due to aortic atherosclerosis noted on March 2021 CT angio. Will increase atorvastatin to 40mg  daily. Encouraged pt to cut back on red meat, mayo, and sugary drinks. Can recheck lipids at follow up appt with Dr Johnsie Cancel in June.  Siddhant Hashemi E. Willys Salvino, PharmD, BCACP, [place] [id number] [place], [place] [address] Phone: [phone]; Fax: [phone] 01/23/2020 11:00 AM

## 2020-01-23 NOTE — Patient Instructions (Addendum)
It was nice to meet you today  Your LDL cholesterol is 79 and your goal is less than 70 -Increase your atorvastatin from 10mg  to 40mg  once a day  Your blood pressure is above goal < 130/66mmHg -Stop taking losartan and start taking losartan-HCTZ combination pill once a day -Continue taking carvedilol 6.25mg  twice daily  Try to cut back on energy drinks and soda, as well as mayo and red meat  Monitor your blood pressure at home - check your pressure a few hours after taking your medications and make sure you're sitting in a chair  Follow up in clinic in 4 weeks for blood pressure check and lab work

## 2020-01-24 ENCOUNTER — Telehealth: Payer: Self-pay

## 2020-01-24 NOTE — Telephone Encounter (Signed)
**Note De-Identified Arthur [name] Obfuscation** I started a Entresto PA through covermymeds. Key: [id number]

## 2020-01-27 NOTE — Telephone Encounter (Signed)
**Note De-Identified Braylan Faul Obfuscation** Letter received from [facility] stating that they did approve the pts Entresto PA. Approval is good until 01/23/2021  I have made [facility] aware of this approval.

## 2020-01-29 ENCOUNTER — Ambulatory Visit: Payer: PPO | Admitting: Adult Health

## 2020-01-29 ENCOUNTER — Other Ambulatory Visit: Payer: Self-pay

## 2020-01-29 ENCOUNTER — Encounter: Payer: Self-pay | Admitting: Adult Health

## 2020-01-29 DIAGNOSIS — J432 Centrilobular emphysema: Secondary | ICD-10-CM

## 2020-01-29 DIAGNOSIS — R918 Other nonspecific abnormal finding of lung field: Secondary | ICD-10-CM | POA: Diagnosis not present

## 2020-01-29 NOTE — Assessment & Plan Note (Signed)
Currently stable on Anoro.  Plan  Patient Instructions  Continue on ANORO 1 puff daily .   Activity as tolerated.  NO SMOKING .  Work on healthy weight loss.  Follow up Dr. Elsworth Soho  In 6 months and As needed   Please contact office for sooner follow up if symptoms do not improve or worsen or seek emergency care

## 2020-01-29 NOTE — Patient Instructions (Signed)
Continue on ANORO 1 puff daily .   Activity as tolerated.  NO SMOKING .  Work on healthy weight loss.  Follow up Dr. Elsworth Soho  In 6 months and As needed   Please contact office for sooner follow up if symptoms do not improve or worsen or seek emergency care

## 2020-01-29 NOTE — Progress Notes (Signed)
@Patient  ID: Arthur [name]., male    DOB: 1941/05/20, 79 y.o.   MRN: [id number]  Chief Complaint  Patient presents with  . Follow-up    COPD     Referring provider: Wendie Agreste, MD  HPI: 79 year old male former smoker followed for COPD and multiple pulmonary nodules. Medical history is significant for congestive heart failure Works with race cars.   TEST/EVENTS :  2D Echo 05/2016 Moderate to severe global reduction in LV function; mild LVH; grade 1 diastolic dysfunction with elevated LV filling pressure; mild biatrial enlargement; trace AI, MR and TR. EF 30-35%  Spirometry 2016 >FEV1 66% , ratio 63.  CT chest 09/2015-nodule stable or decreased compared to 02/2015  CT screening August 21, 2019 showed lung RADS 2.  Clear lungs.  Scattered pulmonary nodules stable  01/29/2020 Follow up : COPD  Patient presents for a 36-month follow-up.  Patient has moderate COPD.  He remains on Anoro daily.  Patient says overall breathing has been doing okay with no flare of cough or wheezing.  He has had some increased shortness of breath.  Also has been having some weakness.  Was seen by cardiology after he was seen in the emergency room last month for hypertensive urgency.  2D echo was done on January 23, 2020 that showed diffuse hypokinesis worse in the mid and basal inferior wall.  EF has decreased to 35 to 40%.  Grade 1 diastolic dysfunction.  She was changed to [employer]. Patient says he is doing some better.  Still gets winded with activity and has low activity tolerance.  However he stays very busy.  He still is a Network engineer.. blood pressure today is 118/74. Patient says he has received both of his Covid vaccine.  Patient says he has cut back on smoking and only smokes very rarely.  Does complete smoking cessation    Allergies  Allergen Reactions  . Doxycycline Other (See Comments)    Esophagitis, severe gi bleed    Immunization History  Administered Date(s)  Administered  . Fluad Quad(high Dose 65+) 07/03/2019  . Influenza Split 08/01/2011, 08/06/2012  . Influenza, High Dose Seasonal PF 06/27/2016, 07/20/2018  . Influenza,inj,Quad PF,6+ Mos 06/27/2013, 09/18/2014, 06/08/2015, 09/14/2017  . Pneumococcal Conjugate-13 10/20/2015  . Pneumococcal Polysaccharide-23 01/10/2000, 10/26/2007  . Tdap 04/26/2010  . Zoster Recombinat (Shingrix) 05/10/2018, 07/20/2018    Past Medical History:  Diagnosis Date  . Arthritis   . Bifascicular block    a. 1st degree AVB/LBBB.  [name] Chronic systolic CHF (congestive heart failure) ([place])   . COPD (chronic obstructive pulmonary disease) ([place])   . ED (erectile dysfunction)   . Essential hypertension, benign    chris guess  pcp  . GERD (gastroesophageal reflux disease)   . Hypercholesterolemia   . Kidney stone    renal stone  . LBBB (left bundle branch block) 10/2007  . Migraines   . Mild dietary indigestion   . NICM (nonischemic cardiomyopathy) ([place])   . Nonischemic cardiomyopathy ([place])   . Prostate cancer ([place])   . Reflux esophagitis   . Shortness of breath dyspnea    W/ EXERTION   . Spider bite   . Thyroid cancer ([place])   . Tobacco use disorder   . Unspecified hypothyroidism     Tobacco History: Social History   Tobacco Use  Smoking Status Former Smoker  . Packs/day: 1.00  . Years: 52.00  . Pack years: 52.00  . Types: Cigarettes  . Quit date: 04/04/2016  .  Years since quitting: 3.8  Smokeless Tobacco Never Used   Counseling given: Not Answered   Outpatient Medications Prior to Visit  Medication Sig Dispense Refill  . ANORO ELLIPTA 62.5-25 MCG/INH AEPB INHALE 1 PUFF INTO THE LUNGS DAILY 60 each 3  . atorvastatin (LIPITOR) 40 MG tablet Take 1 tablet (40 mg total) by mouth daily. 90 tablet 3  . carvedilol (COREG) 6.25 MG tablet Take 1 tablet (6.25 mg total) by mouth 2 (two) times daily with a meal. 180 tablet 3  . levothyroxine (SYNTHROID) 200 MCG tablet TAKE 1 TABLET(200 MCG) BY MOUTH DAILY  (Patient taking differently: Take 200 mcg by mouth daily. ) 90 tablet 1  . pantoprazole (PROTONIX) 40 MG tablet TAKE 1 TABLET(40 MG) BY MOUTH DAILY BEFORE BREAKFAST (Patient taking differently: Take 40 mg by mouth daily. ) 90 tablet 3  . sacubitril-valsartan (ENTRESTO) 49-51 MG Take 1 tablet by mouth 2 (two) times daily. 60 tablet 5   No facility-administered medications prior to visit.     Review of Systems:   Constitutional:   No  weight loss, night sweats,  Fevers, chills, + fatigue, or  lassitude.  HEENT:   No headaches,  Difficulty swallowing,  Tooth/dental problems, or  Sore throat,                No sneezing, itching, ear ache, nasal congestion, post nasal drip,   CV:  No chest pain,  Orthopnea, PND, swelling in lower extremities, anasarca, dizziness, palpitations, syncope.   GI  No heartburn, indigestion, abdominal pain, nausea, vomiting, diarrhea, change in bowel habits, loss of appetite, bloody stools.   Resp:   No chest wall deformity  Skin: no rash or lesions.  GU: no dysuria, change in color of urine, no urgency or frequency.  No flank pain, no hematuria   MS:  No joint pain or swelling.  No decreased range of motion.  No back pain.    Physical Exam  BP 118/74 (BP Location: Left Arm, Patient Position: Sitting, Cuff Size: Large)   Pulse 66   Temp 98.4 F (36.9 C) (Temporal)   Ht 6' (1.829 m)   Wt 238 lb (108 kg)   SpO2 97% Comment: on RA  BMI 32.28 kg/m   GEN: A/Ox3; pleasant , NAD, well nourished    HEENT:  [place]/AT,    NOSE-clear, THROAT-clear, no lesions, no postnasal drip or exudate noted.   NECK:  Supple w/ fair ROM; no JVD; normal carotid impulses w/o bruits; no thyromegaly or nodules palpated; no lymphadenopathy.    RESP  Clear  P & A; w/o, wheezes/ rales/ or rhonchi. no accessory muscle use, no dullness to percussion  CARD:  RRR, no m/r/g, no peripheral edema, pulses intact, no cyanosis or clubbing.  GI:   Soft & nt; nml bowel sounds; no  organomegaly or masses detected.   Musco: Warm bil, no deformities or joint swelling noted.   Neuro: alert, no focal deficits noted.    Skin: Warm, no lesions or rashes    Lab Results:  CBC    Component Value Date/Time   WBC 8.8 12/27/2019 1228   RBC 4.75 12/27/2019 1228   HGB 13.6 12/27/2019 1228   HCT 43.9 12/27/2019 1228   PLT 239 12/27/2019 1228   MCV 92.4 12/27/2019 1228   MCV 89.4 02/19/2018 1656   MCH 28.6 12/27/2019 1228   MCHC 31.0 12/27/2019 1228   RDW 12.9 12/27/2019 1228   LYMPHSABS 1.9 03/30/2017 0055   MONOABS 0.7 03/30/2017  0055   EOSABS 0.3 03/30/2017 0055   BASOSABS 0.0 03/30/2017 0055    BMET    Component Value Date/Time   NA 142 12/27/2019 1228   NA 142 10/23/2019 0945   K 3.7 12/27/2019 1228   CL 106 12/27/2019 1228   CO2 27 12/27/2019 1228   GLUCOSE 120 (H) 12/27/2019 1228   BUN 15 12/27/2019 1228   BUN 14 10/23/2019 0945   CREATININE 1.07 12/27/2019 1228   CREATININE 0.98 08/24/2016 1028   CALCIUM 9.2 12/27/2019 1228   GFRNONAA >60 12/27/2019 1228   GFRNONAA 73 04/14/2016 1454   GFRAA >60 12/27/2019 1228   GFRAA 85 04/14/2016 1454    BNP    Component Value Date/Time   BNP 100.3 (H) 07/04/2016 0940    ProBNP    Component Value Date/Time   PROBNP 275 03/09/2017 1501    Imaging: ECHOCARDIOGRAM COMPLETE  Result Date: 01/23/2020    ECHOCARDIOGRAM REPORT   Patient Name:   Rue Routon. Date of Exam: 01/23/2020 Medical Rec #:  [id number]          Height:       72.0 in Accession #:    [id number]         Weight:       248.0 lb Date of Birth:  June 08, 1941         BSA:          2.335 m Patient Age:    68 years           BP:           158/78 mmHg Patient Gender: M                  HR:           57 bpm. Exam Location:  [facility] Procedure: 2D Echo, 3D Echo, Cardiac Doppler and Color Doppler Indications:    R55 Syncope  History:        Patient has prior history of Echocardiogram examinations, most                 recent 05/20/2019. CHF  and Cardiomyopathy, COPD, Arrythmias:LBBB,                 Signs/Symptoms:Syncope and Dyspnea; Risk Factors:Former Smoker,                 Hypertension, Family History of Coronary Artery Disease and                 Dyslipidemia.  Sonographer:    Deliah Boston RDCS Referring Phys: Outlook  1. Diffuse hypokinesis worse in the mid and basal inferior wall EF appears slightly worse than echo done August 2020 . Left ventricular ejection fraction, by estimation, is 35 to 40%. The left ventricle has moderately decreased function. The left ventricle demonstrates global hypokinesis. The left ventricular internal cavity size was moderately dilated. Left ventricular diastolic parameters are consistent with Grade I diastolic dysfunction (impaired relaxation).  2. Right ventricular systolic function is normal. The right ventricular size is normal.  3. Left atrial size was mildly dilated.  4. The mitral valve is normal in structure. Mild mitral valve regurgitation. No evidence of mitral stenosis.  5. The aortic valve is tricuspid. Aortic valve regurgitation is mild. Mild aortic valve sclerosis is present, with no evidence of aortic valve stenosis.  6. The inferior vena cava is normal in size with greater than 50% respiratory variability, suggesting right  atrial pressure of 3 mmHg. FINDINGS  Left Ventricle: Diffuse hypokinesis worse in the mid and basal inferior wall EF appears slightly worse than echo done August 2020. Left ventricular ejection fraction, by estimation, is 35 to 40%. The left ventricle has moderately decreased function. The  left ventricle demonstrates global hypokinesis. The left ventricular internal cavity size was moderately dilated. There is no left ventricular hypertrophy. Left ventricular diastolic parameters are consistent with Grade I diastolic dysfunction (impaired  relaxation). Right Ventricle: The right ventricular size is normal. No increase in right ventricular wall  thickness. Right ventricular systolic function is normal. Left Atrium: Left atrial size was mildly dilated. Right Atrium: Right atrial size was normal in size. Pericardium: There is no evidence of pericardial effusion. Mitral Valve: The mitral valve is normal in structure. There is mild thickening of the mitral valve leaflet(s). There is mild calcification of the mitral valve leaflet(s). Normal mobility of the mitral valve leaflets. Mild mitral valve regurgitation. No evidence of mitral valve stenosis. Tricuspid Valve: The tricuspid valve is normal in structure. Tricuspid valve regurgitation is not demonstrated. No evidence of tricuspid stenosis. Aortic Valve: The aortic valve is tricuspid. Aortic valve regurgitation is mild. Aortic regurgitation PHT measures 633 msec. Mild aortic valve sclerosis is present, with no evidence of aortic valve stenosis. Pulmonic Valve: The pulmonic valve was normal in structure. Pulmonic valve regurgitation is not visualized. No evidence of pulmonic stenosis. Aorta: The aortic root is normal in size and structure. Venous: The inferior vena cava is normal in size with greater than 50% respiratory variability, suggesting right atrial pressure of 3 mmHg. IAS/Shunts: No atrial level shunt detected by color flow Doppler.  LEFT VENTRICLE PLAX 2D LVIDd:         5.36 cm  Diastology LVIDs:         4.19 cm  LV e' lateral:   5.66 cm/s LV PW:         1.28 cm  LV E/e' lateral: 11.8 LV IVS:        1.05 cm  LV e' medial:    3.70 cm/s LVOT diam:     2.40 cm  LV E/e' medial:  18.0 LV SV:         128 LV SV Index:   55 LVOT Area:     4.52 cm  RIGHT VENTRICLE RV S prime:     15.60 cm/s TAPSE (M-mode): 3.1 cm LEFT ATRIUM              Index       RIGHT ATRIUM           Index LA diam:        4.00 cm  1.71 cm/m  RA Area:     22.90 cm LA Vol (A2C):   100.0 ml 42.84 ml/m RA Volume:   69.00 ml  29.56 ml/m LA Vol (A4C):   74.2 ml  31.78 ml/m LA Biplane Vol: 95.2 ml  40.78 ml/m  AORTIC VALVE LVOT Vmax:    138.00 cm/s LVOT Vmean:  76.700 cm/s LVOT VTI:    0.284 m AI PHT:      633 msec  AORTA Ao Root diam: 3.80 cm Ao Asc diam:  3.70 cm MITRAL VALVE MV Area (PHT): cm          SHUNTS MV Decel Time: 266 msec     Systemic VTI:  0.28 m MV E velocity: 66.70 cm/s   Systemic Diam: 2.40 cm MV A velocity: 109.00 cm/s MV E/A  ratio:  0.61 Jenkins Rouge MD Electronically signed by Jenkins Rouge MD Signature Date/Time: 01/23/2020/11:36:47 AM    Final       No flowsheet data found.  No results found for: NITRICOXIDE      Assessment & Plan:   COPD (chronic obstructive pulmonary disease) (HCC) Currently stable on Anoro.  Plan  Patient Instructions  Continue on ANORO 1 puff daily .   Activity as tolerated.  NO SMOKING .  Work on healthy weight loss.  Follow up Dr. Elsworth Soho  In 6 months and As needed   Please contact office for sooner follow up if symptoms do not improve or worsen or seek emergency care        Multiple lung nodules on CT Stable on CT     Sabrinia Prien, NP 01/29/2020

## 2020-01-29 NOTE — Assessment & Plan Note (Signed)
Stable on CT

## 2020-02-04 DIAGNOSIS — H02831 Dermatochalasis of right upper eyelid: Secondary | ICD-10-CM | POA: Diagnosis not present

## 2020-02-04 DIAGNOSIS — Z961 Presence of intraocular lens: Secondary | ICD-10-CM | POA: Diagnosis not present

## 2020-02-04 DIAGNOSIS — H02834 Dermatochalasis of left upper eyelid: Secondary | ICD-10-CM | POA: Diagnosis not present

## 2020-02-18 ENCOUNTER — Other Ambulatory Visit: Payer: Self-pay

## 2020-02-18 ENCOUNTER — Telehealth: Payer: Self-pay | Admitting: Pharmacist

## 2020-02-18 ENCOUNTER — Ambulatory Visit (INDEPENDENT_AMBULATORY_CARE_PROVIDER_SITE_OTHER): Payer: PPO | Admitting: Pharmacist

## 2020-02-18 VITALS — BP 128/72 | HR 65

## 2020-02-18 DIAGNOSIS — I10 Essential (primary) hypertension: Secondary | ICD-10-CM

## 2020-02-18 DIAGNOSIS — I5022 Chronic systolic (congestive) heart failure: Secondary | ICD-10-CM | POA: Diagnosis not present

## 2020-02-18 DIAGNOSIS — I5042 Chronic combined systolic (congestive) and diastolic (congestive) heart failure: Secondary | ICD-10-CM | POA: Insufficient documentation

## 2020-02-18 LAB — BASIC METABOLIC PANEL
BUN/Creatinine Ratio: 16 (ref 10–24)
BUN: 19 mg/dL (ref 8–27)
CO2: 29 mmol/L (ref 20–29)
Calcium: 9.4 mg/dL (ref 8.6–10.2)
Chloride: 104 mmol/L (ref 96–106)
Creatinine, Ser: 1.16 mg/dL (ref 0.76–1.27)
GFR calc Af Amer: 70 mL/min/{1.73_m2} (ref >59)
GFR calc non Af Amer: 60 mL/min/{1.73_m2} (ref >59)
Glucose: 121 mg/dL — ABNORMAL HIGH (ref 65–99)
Potassium: 4.9 mmol/L (ref 3.5–5.2)
Sodium: 142 mmol/L (ref 134–144)

## 2020-02-18 NOTE — Progress Notes (Addendum)
Patient ID: Arthur [name].                 DOB: 04/19/1941                      MRN: [id number]     HPI: Arthur [name]. is a 79 y.o. male referred by [name] to HTN clinic. PMH is significant for nonischemic DCM, EF improved to 50-55% on echo August 2020, cath 09/2016 showed no significant CAD, however 12/2019 CT angio revealed aortic atherosclerosis, HTN, HLD, LBBB, GERD, COPD, former tobacco abuse, pulmonary nodules, and migraines. At last visit with Dr Johnsie Cancel on 01/02/20, pt reported more headaches and BP was elevated at home and in office at 158/78. His carvedilol was increased to 6.25mg  BID. He was seen by me on 01/23/20 and underwent echo the same day. His EF had decreased to 35-40% so he was changed from losartan to [facility]. He presents today for follow up.  Pt presents today in good spirits. He reports tolerating his medications well. He denies dizziness, falls, headaches, and mild improvement in LEE. He does report some intermittent numbness in his right hand that began after starting Entresto. He reports this has improved a bit over the past few weeks. 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 Denies NSAID use. Minimal exercise, has made some improvements with his diet and has been watching his portion sizes and snacking. Reports he has lost 10 lbs.  He has been monitoring his BP at home but forgot to bring log today. Home BP has ranged 115-125/60s, HR 60s. Reports 1 low reading of 98/65 and did not feel well at that time. He was laying around the house that day but doesn't think he was staying as hydrated as normal.  Current HTN meds: carvedilol 6.25mg  BID, Entresto 49-51mg  BID  BP goal: <130/87mmHg  Family History: The patient's family history includes Colon cancer in his brother; Diabetes in his brother; Heart attack in his father; Skin cancer in his brother.  Social History: Former smoker 1 PPD for 52 years, quit in 2017, rare alcohol use, denies drug use.  Diet: Yesterday - ate BLT  with a lot of mayo, chicken salad sandwich, corn with butter, creamsicle. 2 bottles of regular coke and 1 bottle of water each day. Trying to cut back on salt and soft drinks. Used to drink a Hospital doctor a few days a week.  Exercise: Minimal  Wt Readings from Last 3 Encounters:  01/29/20 238 lb (108 kg)  01/02/20 248 lb (112.5 kg)  11/13/19 250 lb (113.4 kg)   BP Readings from Last 3 Encounters:  01/29/20 118/74  01/23/20 (!) 168/84  01/02/20 (!) 158/78   Pulse Readings from Last 3 Encounters:  01/29/20 66  01/23/20 (!) 59  01/02/20 71    Renal function: CrCl cannot be calculated (Patient's most recent lab result is older than the maximum 21 days allowed.).  Past Medical History:  Diagnosis Date  . Arthritis   . Bifascicular block    a. 1st degree AVB/LBBB.  [name] Chronic systolic CHF (congestive heart failure) ([place])   . COPD (chronic obstructive pulmonary disease) ([place])   . ED (erectile dysfunction)   . Essential hypertension, benign    chris guess  pcp  . GERD (gastroesophageal reflux disease)   . Hypercholesterolemia   . Kidney stone    renal stone  . LBBB (left bundle branch block) 10/2007  . Migraines   . Mild dietary indigestion   .  NICM (nonischemic cardiomyopathy) ([name])   . Nonischemic cardiomyopathy ([place])   . Prostate cancer ([place])   . Reflux esophagitis   . Shortness of breath dyspnea    W/ EXERTION   . Spider bite   . Thyroid cancer ([place])   . Tobacco use disorder   . Unspecified hypothyroidism     Current Outpatient Medications on File Prior to Visit  Medication Sig Dispense Refill  . ANORO ELLIPTA 62.5-25 MCG/INH AEPB INHALE 1 PUFF INTO THE LUNGS DAILY 60 each 3  . atorvastatin (LIPITOR) 40 MG tablet Take 1 tablet (40 mg total) by mouth daily. 90 tablet 3  . carvedilol (COREG) 6.25 MG tablet Take 1 tablet (6.25 mg total) by mouth 2 (two) times daily with a meal. 180 tablet 3  . levothyroxine (SYNTHROID) 200 MCG tablet TAKE 1 TABLET(200 MCG) BY MOUTH DAILY  (Patient taking differently: Take 200 mcg by mouth daily. ) 90 tablet 1  . pantoprazole (PROTONIX) 40 MG tablet TAKE 1 TABLET(40 MG) BY MOUTH DAILY BEFORE BREAKFAST (Patient taking differently: Take 40 mg by mouth daily. ) 90 tablet 3  . sacubitril-valsartan (ENTRESTO) 49-51 MG Take 1 tablet by mouth 2 (two) times daily. 60 tablet 5   No current facility-administered medications on file prior to visit.    Allergies  Allergen Reactions  . Doxycycline Other (See Comments)    Esophagitis, severe gi bleed     Assessment/Plan:  1. Hypertension/HFrEF - BP has improved and is now at goal <130/24mmHg, however there is still room to optimize CHF medications. Will check BMET today with recent Entresto start. If labs are stable, will plan to start spironolactone 12.5mg  daily. Continue carvedilol 6.25mg  BID and Entresto 49-51mg  BID. He will keep an eye on the numbness in his right hand. Advised that this is not a common side effect of Entresto. Will schedule f/u when results are discussed with pt and med changes are made. Pt does have follow up with Dr Johnsie Cancel in 3 weeks.  Addendum - BMET stable however K has increased to 4.9 so will not start spironolactone. Will continue current meds and can recheck BMET in 3 weeks at [place] with Dr Johnsie Cancel and reassess K for potential spironolactone start for CHF benefit.  Arthur [name], PharmD, BCACP, [place] [id number] [address], [place], [place] [address] Phone: [phone]; Fax: [phone] 02/18/2020 7:18 AM

## 2020-02-18 NOTE — Addendum Note (Signed)
Addended by: Hiram Mciver E on: 02/18/2020 04:52 PM   Modules accepted: Orders

## 2020-02-18 NOTE — Telephone Encounter (Addendum)
BMET stable after changing losartan to Entresto. K has increased to 4.9 so will not start spironolactone today. Pt to continue carvedilol 6.25mg  BID and Entresto 49-51mg  BID. Pt is aware of results and medication plan,  He sees Dr Johnsie Cancel for follow up in 3 weeks. Will plan to recheck BMET at that time and if K is a bit lower, can start low dose spironolactone 12.5mg  daily for CHF benefit.

## 2020-02-18 NOTE — Patient Instructions (Addendum)
It was nice to see you today  Your blood pressure is excellent and at goal < 130/47mmHg  Continue taking your current medications  I'll give you a call when your labwork comes back. We may start a new medication called spironolactone
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
## 2020-03-09 ENCOUNTER — Encounter: Payer: Self-pay | Admitting: Cardiovascular Disease

## 2020-03-09 ENCOUNTER — Other Ambulatory Visit: Payer: Self-pay

## 2020-03-09 ENCOUNTER — Ambulatory Visit: Payer: PPO | Admitting: Cardiovascular Disease

## 2020-03-09 VITALS — BP 114/60 | HR 63 | Ht 72.0 in | Wt 240.0 lb

## 2020-03-09 DIAGNOSIS — I420 Dilated cardiomyopathy: Secondary | ICD-10-CM

## 2020-03-09 DIAGNOSIS — I5022 Chronic systolic (congestive) heart failure: Secondary | ICD-10-CM | POA: Diagnosis not present

## 2020-03-09 LAB — LIPID PANEL
Chol/HDL Ratio: 3.9 ratio (ref 0.0–5.0)
Cholesterol, Total: 108 mg/dL (ref 100–199)
HDL: 28 mg/dL — ABNORMAL LOW (ref >39)
LDL Chol Calc (NIH): 55 mg/dL (ref 0–99)
Triglycerides: 139 mg/dL (ref 0–149)
VLDL Cholesterol Cal: 25 mg/dL (ref 5–40)

## 2020-03-09 LAB — COMPREHENSIVE METABOLIC PANEL
ALT: 15 IU/L (ref 0–44)
AST: 16 IU/L (ref 0–40)
Albumin/Globulin Ratio: 1.6 (ref 1.2–2.2)
Albumin: 4.1 g/dL (ref 3.7–4.7)
Alkaline Phosphatase: 123 IU/L — ABNORMAL HIGH (ref 48–121)
BUN/Creatinine Ratio: 18 (ref 10–24)
BUN: 19 mg/dL (ref 8–27)
Bilirubin Total: 0.6 mg/dL (ref 0.0–1.2)
CO2: 27 mmol/L (ref 20–29)
Calcium: 9.6 mg/dL (ref 8.6–10.2)
Chloride: 106 mmol/L (ref 96–106)
Creatinine, Ser: 1.05 mg/dL (ref 0.76–1.27)
GFR calc Af Amer: 79 mL/min/{1.73_m2} (ref >59)
GFR calc non Af Amer: 68 mL/min/{1.73_m2} (ref >59)
Globulin, Total: 2.5 g/dL (ref 1.5–4.5)
Glucose: 105 mg/dL — ABNORMAL HIGH (ref 65–99)
Potassium: 4.5 mmol/L (ref 3.5–5.2)
Sodium: 145 mmol/L — ABNORMAL HIGH (ref 134–144)
Total Protein: 6.6 g/dL (ref 6.0–8.5)

## 2020-03-09 NOTE — Patient Instructions (Signed)
Medication Instructions:  *If you need a refill on your cardiac medications before your next appointment, please call your pharmacy*   Lab Work: Your physician recommends that you have lab work today. CMET and lipids  If you have labs (blood work) drawn today and your tests are completely normal, you will receive your results only by: [name] MyChart Message (if you have MyChart) OR . A paper copy in the mail If you have any lab test that is abnormal or we need to change your treatment, we will call you to review the results.  Follow-Up: At [facility], you and your health needs are our priority.  As part of our continuing mission to provide you with exceptional heart care, we have created designated Provider Care Teams.  These Care Teams include your primary Cardiologist (physician) and Advanced Practice Providers (APPs -  Physician Assistants and Nurse Practitioners) who all work together to provide you with the care you need, when you need it.  We recommend signing up for the patient portal called "MyChart".  Sign up information is provided on this After Visit Summary.  MyChart is used to connect with patients for Virtual Visits (Telemedicine).  Patients are able to view lab/test results, encounter notes, upcoming appointments, etc.  Non-urgent messages can be sent to your provider as well.   To learn more about what you can do with MyChart, go to [email or web address].    Your next appointment:   6 month(s)  The format for your next appointment:   In Person  Provider:   You may see Jenkins Rouge, MD or one of the following Advanced Practice Providers on your designated Care Team:    Truitt Merle, NP  Cecilie Kicks, NP  Kathyrn Drown, NP

## 2020-03-11 ENCOUNTER — Telehealth: Payer: Self-pay

## 2020-03-11 DIAGNOSIS — Z79899 Other long term (current) drug therapy: Secondary | ICD-10-CM

## 2020-03-11 DIAGNOSIS — I10 Essential (primary) hypertension: Secondary | ICD-10-CM

## 2020-03-11 MED ORDER — SPIRONOLACTONE 25 MG PO TABS: 12.5000 mg | ORAL_TABLET | ORAL | 3 refills | Status: DC

## 2020-03-11 NOTE — Telephone Encounter (Signed)
-----   Message from [name], MD sent at 03/10/2020  9:36 AM EDT ----- Would start qod 12.5 mg and repeat BMET in 3 weeks  ----- Message ----- From: Leeroy Bock, RPH-CPP Sent: 03/10/2020   9:13 AM EDT To: [name], MD  Hey Dr Johnsie Cancel,  Pt saw you yesterday for follow up. I had seen him recently for Entresto titration. His follow up BMET yesterday showed K decreased from 4.9 to 4.5. BP was 114/60. Thoughts on starting low dose spironolactone given CHF?  Thanks, Barista ----- Message ----- From: Michaelyn Barter, RN Sent: 03/10/2020   9:08 AM EDT To: Leeroy Bock, RPH-CPP  Will forward to Pharm D who ordered lab work.

## 2020-03-11 NOTE — Telephone Encounter (Signed)
Sent spirolactone 12.5 mg qod to patient's pharmacy. Patient will come in for BMET on 04/01/20.

## 2020-03-30 ENCOUNTER — Other Ambulatory Visit: Payer: Self-pay | Admitting: Internal Medicine

## 2020-04-01 ENCOUNTER — Other Ambulatory Visit: Payer: PPO | Admitting: *Deleted

## 2020-04-01 ENCOUNTER — Other Ambulatory Visit: Payer: Self-pay

## 2020-04-01 DIAGNOSIS — I10 Essential (primary) hypertension: Secondary | ICD-10-CM

## 2020-04-01 DIAGNOSIS — Z79899 Other long term (current) drug therapy: Secondary | ICD-10-CM | POA: Diagnosis not present

## 2020-04-01 LAB — BASIC METABOLIC PANEL
BUN/Creatinine Ratio: 20 (ref 10–24)
BUN: 25 mg/dL (ref 8–27)
CO2: 25 mmol/L (ref 20–29)
Calcium: 9.4 mg/dL (ref 8.6–10.2)
Chloride: 102 mmol/L (ref 96–106)
Creatinine, Ser: 1.24 mg/dL (ref 0.76–1.27)
GFR calc Af Amer: 64 mL/min/{1.73_m2} (ref >59)
GFR calc non Af Amer: 56 mL/min/{1.73_m2} — ABNORMAL LOW (ref >59)
Glucose: 120 mg/dL — ABNORMAL HIGH (ref 65–99)
Potassium: 4.5 mmol/L (ref 3.5–5.2)
Sodium: 141 mmol/L (ref 134–144)

## 2020-04-02 ENCOUNTER — Ambulatory Visit (INDEPENDENT_AMBULATORY_CARE_PROVIDER_SITE_OTHER): Payer: PPO | Admitting: Family Medicine

## 2020-04-02 VITALS — BP 114/60 | Ht 72.0 in | Wt 240.0 lb

## 2020-04-02 DIAGNOSIS — Z0000 Encounter for general adult medical examination without abnormal findings: Secondary | ICD-10-CM | POA: Diagnosis not present
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
## 2020-04-02 NOTE — Patient Instructions (Addendum)
Thank you for taking time to come for your Medicare Wellness Visit. I appreciate your ongoing commitment to your health goals. Please review the following plan we discussed and let me know if I can assist you in the future.  Leroy Kennedy LPN  Preventive Care 64 Years and Older, Male Preventive care refers to lifestyle choices and visits with your health care provider that can promote health and wellness. This includes:  A yearly physical exam. This is also called an annual well check.  Regular dental and eye exams.  Immunizations.  Screening for certain conditions.  Healthy lifestyle choices, such as diet and exercise. What can I expect for my preventive care visit? Physical exam Your health care provider will check:  Height and weight. These may be used to calculate body mass index (BMI), which is a measurement that tells if you are at a healthy weight.  Heart rate and blood pressure.  Your skin for abnormal spots. Counseling Your health care provider may ask you questions about:  Alcohol, tobacco, and drug use.  Emotional well-being.  Home and relationship well-being.  Sexual activity.  Eating habits.  History of falls.  Memory and ability to understand (cognition).  Work and work Statistician. What immunizations do I need?  Influenza (flu) vaccine  This is recommended every year. Tetanus, diphtheria, and pertussis (Tdap) vaccine  You may need a Td booster every 10 years. Varicella (chickenpox) vaccine  You may need this vaccine if you have not already been vaccinated. Zoster (shingles) vaccine  You may need this after age 66. Pneumococcal conjugate (PCV13) vaccine  One dose is recommended after age 84. Pneumococcal polysaccharide (PPSV23) vaccine  One dose is recommended after age 88. Measles, mumps, and rubella (MMR) vaccine  You may need at least one dose of MMR if you were born in 1957 or later. You may also need a second dose. Meningococcal  conjugate (MenACWY) vaccine  You may need this if you have certain conditions. Hepatitis A vaccine  You may need this if you have certain conditions or if you travel or work in places where you may be exposed to hepatitis A. Hepatitis B vaccine  You may need this if you have certain conditions or if you travel or work in places where you may be exposed to hepatitis B. Haemophilus influenzae type b (Hib) vaccine  You may need this if you have certain conditions. You may receive vaccines as individual doses or as more than one vaccine together in one shot (combination vaccines). Talk with your health care provider about the risks and benefits of combination vaccines. What tests do I need? Blood tests  Lipid and cholesterol levels. These may be checked every 5 years, or more frequently depending on your overall health.  Hepatitis C test.  Hepatitis B test. Screening  Lung cancer screening. You may have this screening every year starting at age 24 if you have a 30-pack-year history of smoking and currently smoke or have quit within the past 15 years.  Colorectal cancer screening. All adults should have this screening starting at age 52 and continuing until age 61. Your health care provider may recommend screening at age 57 if you are at increased risk. You will have tests every 1-10 years, depending on your results and the type of screening test.  Prostate cancer screening. Recommendations will vary depending on your family history and other risks.  Diabetes screening. This is done by checking your blood sugar (glucose) after you have not eaten for  a while (fasting). You may have this done every 1-3 years.  Abdominal aortic aneurysm (AAA) screening. You may need this if you are a current or former smoker.  Sexually transmitted disease (STD) testing. Follow these instructions at home: Eating and drinking  Eat a diet that includes fresh fruits and vegetables, whole grains, lean  protein, and low-fat dairy products. Limit your intake of foods with high amounts of sugar, saturated fats, and salt.  Take vitamin and mineral supplements as recommended by your health care provider.  Do not drink alcohol if your health care provider tells you not to drink.  If you drink alcohol: ? Limit how much you have to 0-2 drinks a day. ? Be aware of how much alcohol is in your drink. In the [place] one drink equals one 12 oz bottle of beer (355 mL), one 5 oz glass of wine (148 mL), or one 1 oz glass of hard liquor (44 mL). Lifestyle  Take daily care of your teeth and gums.  Stay active. Exercise for at least 30 minutes on 5 or more days each week.  Do not use any products that contain nicotine or tobacco, such as cigarettes, e-cigarettes, and chewing tobacco. If you need help quitting, ask your health care provider.  If you are sexually active, practice safe sex. Use a condom or other form of protection to prevent STIs (sexually transmitted infections).  Talk with your health care provider about taking a low-dose aspirin or statin. What's next?  Visit your health care provider once a year for a well check visit.  Ask your health care provider how often you should have your eyes and teeth checked.  Stay up to date on all vaccines. This information is not intended to replace advice given to you by your health care provider. Make sure you discuss any questions you have with your health care provider. Document Revised: 09/13/2018 Document Reviewed: 09/13/2018 Elsevier Patient Education  2020 Elsevier Inc.  

## 2020-05-26 ENCOUNTER — Other Ambulatory Visit: Payer: Self-pay | Admitting: Pulmonary Disease

## 2020-05-27 ENCOUNTER — Other Ambulatory Visit: Payer: Self-pay | Admitting: Family Medicine

## 2020-05-27 DIAGNOSIS — E039 Hypothyroidism, unspecified: Secondary | ICD-10-CM

## 2020-07-02 ENCOUNTER — Other Ambulatory Visit: Payer: Self-pay | Admitting: Internal Medicine

## 2020-07-23 ENCOUNTER — Other Ambulatory Visit: Payer: Self-pay | Admitting: Pulmonary Disease

## 2020-07-28 ENCOUNTER — Other Ambulatory Visit: Payer: Self-pay | Admitting: Cardiovascular Disease

## 2020-07-28 MED ORDER — ENTRESTO 49-51 MG PO TABS: 1.0000 | ORAL_TABLET | Freq: Two times a day (BID) | ORAL | 8 refills | Status: DC

## 2020-07-30 ENCOUNTER — Other Ambulatory Visit: Payer: Self-pay | Admitting: Cardiovascular Disease

## 2020-07-30 ENCOUNTER — Telehealth: Payer: Self-pay | Admitting: Cardiovascular Disease

## 2020-07-30 MED ORDER — ENTRESTO 49-51 MG PO TABS: 1.0000 | ORAL_TABLET | Freq: Two times a day (BID) | ORAL | 2 refills | Status: DC

## 2020-07-30 MED ORDER — ENTRESTO 49-51 MG PO TABS
1.0000 | ORAL_TABLET | Freq: Two times a day (BID) | ORAL | 3 refills | Status: DC
Start: 2020-07-30 — End: 2020-09-08

## 2020-07-30 NOTE — Telephone Encounter (Signed)
*  STAT* If patient is at the pharmacy, call can be transferred to refill team.   1. Which medications need to be refilled? (please list name of each medication and dose if known) sacubitril-valsartan (ENTRESTO) 49-51 MG  2. Which pharmacy/location (including street and city if local pharmacy) is medication to be sent to? [id number] - [place], [place] [address] AT [place]  3. Do they need a 30 day or 90 day supply? 90 day   Patient is out of medication. Patient has an appointment 09/08/20

## 2020-07-30 NOTE — Telephone Encounter (Signed)
Pt's medication was sent to pt's pharmacy as requested. Confirmation received.  °

## 2020-07-30 NOTE — Telephone Encounter (Signed)
Pt c/o BP issue: STAT if pt c/o blurred vision, one-sided weakness or slurred speech  1. What are your last 5 BP readings? 186/56 HR 59, 183/95 HR 55, 170/89 HR 57, 170/84 HR 61  2. Are you having any other symptoms (ex. Dizziness, headache, blurred vision, passed out)? no  3. What is your BP issue? Patient states he would like to discuss his BP readings with a nurse.

## 2020-07-30 NOTE — Telephone Encounter (Signed)
Patient called back. Patient stated his BP has been up the last 3 days, but is asymptomatic. Patient stated he took his BP 4 hours after taking his medications. Patient stated at this time he is out of [place]. Sent in refill for patient and told him to take it as soon as he picks it up. Patient stated he does not want to add any medication and would like to see how he does over the weekend after starting his Entresto back. Encouraged patient to keep a low salt diet and to give our office a call if his BP does not come down. Patient verbalized understanding.

## 2020-07-30 NOTE — Telephone Encounter (Signed)
Left message for patient to call back  

## 2020-08-10 ENCOUNTER — Encounter: Payer: Self-pay | Admitting: Adult Health

## 2020-08-10 ENCOUNTER — Ambulatory Visit: Payer: PPO | Admitting: Adult Health

## 2020-08-10 ENCOUNTER — Other Ambulatory Visit: Payer: Self-pay | Admitting: Pulmonary Disease

## 2020-08-10 ENCOUNTER — Other Ambulatory Visit: Payer: Self-pay

## 2020-08-10 VITALS — BP 170/78 | HR 54 | Temp 97.6°F | Ht 72.0 in | Wt 234.8 lb

## 2020-08-10 DIAGNOSIS — R918 Other nonspecific abnormal finding of lung field: Secondary | ICD-10-CM

## 2020-08-10 DIAGNOSIS — I10 Essential (primary) hypertension: Secondary | ICD-10-CM | POA: Diagnosis not present

## 2020-08-10 DIAGNOSIS — J432 Centrilobular emphysema: Secondary | ICD-10-CM | POA: Diagnosis not present

## 2020-08-10 DIAGNOSIS — Z23 Encounter for immunization: Secondary | ICD-10-CM

## 2020-08-10 DIAGNOSIS — I5022 Chronic systolic (congestive) heart failure: Secondary | ICD-10-CM

## 2020-08-10 NOTE — Assessment & Plan Note (Signed)
Blood pressure is elevated today.  Exam is unrevealing.  Advised to follow-up with primary care provider/or cardiology.  Low-salt diet.  Continue on current regimen

## 2020-08-10 NOTE — Assessment & Plan Note (Signed)
Compensated on present regimen. Continue yearly low-dose CT screening program  Plan  Patient Instructions  Continue on ANORO 1 puff daily .   Activity as tolerated.  Work on healthy weight loss.  Flu shot today .  Follow up Dr. Elsworth Soho  In 6 months and As needed   Please contact office for sooner follow up if symptoms do not improve or worsen or seek emergency care
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
## 2020-08-10 NOTE — Patient Instructions (Addendum)
Continue on ANORO 1 puff daily .   Activity as tolerated.  Work on healthy weight loss.  Flu shot today .  Follow up Dr. Elsworth Soho  In 6 months and As needed   Please contact office for sooner follow up if symptoms do not improve or worsen or seek emergency care

## 2020-08-10 NOTE — Assessment & Plan Note (Signed)
Appears compensated without evidence of volume overload on exam.  Continue follow-up with cardiology.

## 2020-08-10 NOTE — Assessment & Plan Note (Signed)
Appears to be stable on CT chest.  He participates in the low-dose CT screening.  Next CT chest scan should be in later this month.

## 2020-09-01 ENCOUNTER — Other Ambulatory Visit: Payer: Self-pay | Admitting: Cardiovascular Disease
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
## 2020-09-08 ENCOUNTER — Ambulatory Visit: Payer: PPO | Admitting: Cardiovascular Disease

## 2020-09-08 ENCOUNTER — Encounter: Payer: Self-pay | Admitting: Cardiovascular Disease

## 2020-09-08 ENCOUNTER — Other Ambulatory Visit: Payer: Self-pay

## 2020-09-08 VITALS — BP 170/82 | HR 62 | Ht 72.0 in | Wt 234.4 lb

## 2020-09-08 DIAGNOSIS — I10 Essential (primary) hypertension: Secondary | ICD-10-CM

## 2020-09-08 MED ORDER — ENTRESTO 97-103 MG PO TABS: 1.0000 | ORAL_TABLET | Freq: Two times a day (BID) | ORAL | 11 refills | Status: DC

## 2020-09-08 MED ORDER — HYDRALAZINE HCL 25 MG PO TABS: 25.0000 mg | ORAL_TABLET | ORAL | 3 refills | Status: DC | PRN

## 2020-09-08 NOTE — Patient Instructions (Signed)
Medication Instructions:  Your physician has recommended you make the following change in your medication:  1. INCREASE ENTRESTO TO 97-103 MG TWICE DAILY.  2. START HYDRALAZINE 25 MG BY MOUTH AS NEEDED FOR SBP OVER 160.  *If you need a refill on your cardiac medications before your next appointment, please call your pharmacy*   Lab Work: NONE If you have labs (blood work) drawn today and your tests are completely normal, you will receive your results only by: [name] MyChart Message (if you have MyChart) OR . A paper copy in the mail If you have any lab test that is abnormal or we need to change your treatment, we will call you to review the results.   Testing/Procedures: NONE   Follow-Up: At [facility], you and your health needs are our priority.  As part of our continuing mission to provide you with exceptional heart care, we have created designated Provider Care Teams.  These Care Teams include your primary Cardiologist (physician) and Advanced Practice Providers (APPs -  Physician Assistants and Nurse Practitioners) who all work together to provide you with the care you need, when you need it.  We recommend signing up for the patient portal called "MyChart".  Sign up information is provided on this After Visit Summary.  MyChart is used to connect with patients for Virtual Visits (Telemedicine).  Patients are able to view lab/test results, encounter notes, upcoming appointments, etc.  Non-urgent messages can be sent to your provider as well.   To learn more about what you can do with MyChart, go to [email or web address].    Your next appointment:   3 month(s)  The format for your next appointment:   In Person  Provider:   You may see Jenkins Rouge, MD or one of the following Advanced Practice Providers on your designated Care Team:    Truitt Merle, NP  Cecilie Kicks, NP  Kathyrn Drown, NP  You have been referred to BLOOD PRESSURE CLINIC IN 3 TO 4 WEEKS

## 2020-09-21 ENCOUNTER — Other Ambulatory Visit: Payer: Self-pay | Admitting: Cardiovascular Disease

## 2020-09-21 DIAGNOSIS — I10 Essential (primary) hypertension: Secondary | ICD-10-CM

## 2020-10-05 ENCOUNTER — Other Ambulatory Visit: Payer: Self-pay

## 2020-10-05 ENCOUNTER — Telehealth: Payer: Self-pay | Admitting: Pharmacist

## 2020-10-05 ENCOUNTER — Ambulatory Visit (INDEPENDENT_AMBULATORY_CARE_PROVIDER_SITE_OTHER): Payer: PPO | Admitting: Pharmacist

## 2020-10-05 VITALS — BP 150/72 | HR 67 | Wt 235.8 lb

## 2020-10-05 DIAGNOSIS — I10 Essential (primary) hypertension: Secondary | ICD-10-CM | POA: Diagnosis not present

## 2020-10-05 LAB — BASIC METABOLIC PANEL
BUN/Creatinine Ratio: 12 (ref 10–24)
BUN: 11 mg/dL (ref 8–27)
CO2: 30 mmol/L — ABNORMAL HIGH (ref 20–29)
Calcium: 9.5 mg/dL (ref 8.6–10.2)
Chloride: 102 mmol/L (ref 96–106)
Creatinine, Ser: 0.95 mg/dL (ref 0.76–1.27)
GFR calc Af Amer: 88 mL/min/{1.73_m2} (ref >59)
GFR calc non Af Amer: 76 mL/min/{1.73_m2} (ref >59)
Glucose: 98 mg/dL (ref 65–99)
Potassium: 4.5 mmol/L (ref 3.5–5.2)
Sodium: 143 mmol/L (ref 134–144)

## 2020-10-05 LAB — TSH: TSH: 0.977 u[IU]/mL (ref 0.450–4.500)

## 2020-10-05 NOTE — Progress Notes (Signed)
Patient ID: Arthur [name].                 DOB: 01-24-1941                      MRN: [id number]     HPI: Raul Howald. is a 80 y.o. male referred by [name] to HTN clinic. PMH is significant for GERD, COPD, cardiomyopathy with no significant CAD on cath 09/02/16, CHF with EF of 35-40%. At last visit with [name] patients blood pressure was 170/82. Entresto was increased to 97/103mg  twice a day and as needed hydralazine was added. HR was 62.  Patient presents today for follow up. He is a very sweetHe denies dizziness, lightheadedness, headache or blurred vision. He states he is always SOB and this is not changed from baseline. His legs swell throughout the day, but swelling is gone by AM. On exam, very minimal swelling. Hurt his shoulder about a week ago. Still racing cars and goes dancing every Friday night. States he is compliant with his medications and appears to know them well. Has not had thyroid labs in over a year.  Blood pressure at home has been average about 150/75-80 per patient report. Has not been above 170 in about 2 weeks. He did have to take hydralazine a few weeks ago, but hasn't needed any in about a week.   Current HTN meds: Entresto 97/103mg  twice a day, carvedilol 6.25mg  twice a day, spironolactone 12.5mg  every other day, hydralazine 25mg  as needed for SBP>160 Previously tried:  BP goal: <130/80  Family History: The patient's family history includes Colon cancer in his brother; Diabetes in his brother; Heart attack in his father; Skin cancer in his brother.  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  Past Medical History:  Diagnosis Date  . Arthritis   . Bifascicular block    a. 1st degree AVB/LBBB.  . Cataract   . Chronic systolic CHF (congestive heart failure) ([place])   . COPD (chronic obstructive pulmonary disease) ([place])   . ED (erectile dysfunction)   . Essential hypertension, benign    chris guess  pcp  . GERD (gastroesophageal reflux disease)   . Hypercholesterolemia   . Kidney stone    renal stone  . LBBB (left bundle branch block) 10/2007  . Migraines   . Mild dietary indigestion   . NICM (nonischemic cardiomyopathy) ([place])   . Nonischemic cardiomyopathy ([place])   . Prostate cancer ([place])   . Reflux esophagitis   . Shortness of breath dyspnea    W/ EXERTION   . Spider bite   . Thyroid cancer ([place])   . Tobacco use disorder   .  Unspecified hypothyroidism     Current Outpatient Medications on File Prior to Visit  Medication Sig Dispense Refill  . ANORO ELLIPTA 62.5-25 MCG/INH AEPB INHALE 1 PUFF INTO THE LUNGS DAILY 60 each 3  . atorvastatin (LIPITOR) 40 MG tablet Take 1 tablet (40 mg total) by mouth daily. Please keep upcoming appointment in Dec. 2021. Thank you 90 tablet 0  . carvedilol (COREG) 6.25 MG tablet Take 1 tablet (6.25 mg total) by mouth 2 (two) times daily with a meal. 180 tablet 3  . hydrALAZINE (APRESOLINE) 25 MG tablet Take 1 tablet (25 mg total) by mouth as needed (for SBP over 160). 90 tablet 3  . levothyroxine (SYNTHROID) 200 MCG tablet TAKE 1 TABLET(200 MCG) BY MOUTH DAILY 90 tablet 1  . pantoprazole (PROTONIX) 40 MG tablet TAKE 1 TABLET(40 MG) BY MOUTH DAILY BEFORE BREAKFAST 90 tablet 0  .  sacubitril-valsartan (ENTRESTO) 97-103 MG Take 1 tablet by mouth 2 (two) times daily. 60 tablet 11  . spironolactone (ALDACTONE) 25 MG tablet Take 0.5 tablets (12.5 mg total) by mouth every other day. 22 tablet 3   No current facility-administered medications on file prior to visit.    Allergies  Allergen Reactions  . Doxycycline Other (See Comments)    Esophagitis, severe gi bleed     Assessment/Plan:  1. Hypertension - Blood pressure is above goal of <130/80 in clinic today. Will get a BMP today. Will also check TSH. Depending on results of BMP, will plan to either increase spironolactone or start chlorthalidone. More likely the latter due to history of higher K.  I will call pt tomorrow to finalize plan. Asked patient to keep a log of his blood pressures and bring them with him to next visit. I also advised him to limit his caffeine intake. Advised against any use of energy drinks. Follow up in 2 weeks.  Thank you,  Olene Floss, Pharm.D, BCPS, CPP [facility]  [address], [place], [place] [address]  Phone: [phone]; Fax: [phone]

## 2020-10-05 NOTE — Telephone Encounter (Signed)
Called patient advised that labs are normal. K 4.5. Will increase spironolactone from 12.5mg  every other day to daily. If blood pressure still high at next visit, will plan to start chlorthalidone 12.5mg  daily.

## 2020-10-05 NOTE — Patient Instructions (Addendum)
It was nice to meet you!  Please try to limit your caffeine intake.  I will call you tomorrow with you lab results and go over the medication plan.  Keep checking your blood pressure daily. Please bring your log with you to your appointment.  Call me at [phone] with any questions

## 2020-10-19 ENCOUNTER — Ambulatory Visit: Payer: PPO

## 2020-10-27 ENCOUNTER — Ambulatory Visit (INDEPENDENT_AMBULATORY_CARE_PROVIDER_SITE_OTHER): Payer: PPO | Admitting: Pharmacist

## 2020-10-27 ENCOUNTER — Other Ambulatory Visit: Payer: Self-pay

## 2020-10-27 VITALS — BP 140/76 | HR 66

## 2020-10-27 DIAGNOSIS — I5022 Chronic systolic (congestive) heart failure: Secondary | ICD-10-CM

## 2020-10-27 DIAGNOSIS — I10 Essential (primary) hypertension: Secondary | ICD-10-CM

## 2020-10-27 NOTE — Patient Instructions (Signed)
It was nice to see you today!  I will call you tomorrow with your lab results. If everything looks good, we will plan to increase spironolactone to 25mg . I will call you tomorrow to confirm.  Call me at [phone] with any questions

## 2020-10-27 NOTE — Progress Notes (Signed)
Patient ID: Arthur [name].                 DOB: Mar 31, 1941                      MRN: [id number]     HPI: Arthur [name]. is a 80 y.o. male referred by [name] to HTN clinic. PMH is significant for GERD, COPD, cardiomyopathy with no significant CAD on cath 09/02/16, CHF with EF of 35-40%. At last visit with [name] patients blood pressure was 170/82. Entresto was increased to 97/103mg  twice a day and as needed hydralazine was added. HR was 62.  At last visit with HTN clinic, spironolactone was increased from every other day to daily.   Patient present today for follow up. He denies dizziness, lightheadedness, headache, blurred vision or swelling. SOB is chronic, but better. Has not needed hydralazine. He brings in a list of home BP. Several I the 150's but the last several days were at goal. BP 140/76 today in clinic. Due to BMP today. Not interested in decreasing caffeine intake much.  Current HTN meds: Entresto 97/103mg  twice a day, carvedilol 6.25mg  twice a day, spironolactone 12.5mg  daily, hydralazine 25mg  as needed for SBP>160 Previously tried:  BP goal: <130/801  Family History: The patient's family history includes Colon cancer in his brother; Diabetes in his brother; Heart attack in his father; Skin cancer in his brother.  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, driving an excavator, dancing every [place] BP readings: 148/79, 157/95, 156/85, 156/81, 150/80, 153/86, 121/80, 110/99, 131/58, 93/59, 112/72, 121/60  Wt Readings from Last 3 Encounters:  10/05/20 235 lb 12.8 oz (107 kg)  09/08/20 234 lb 6.4 oz (106.3 kg)  08/10/20 234 lb 12.8 oz (106.5 kg)   BP Readings from Last 3 Encounters:  10/05/20 (!) 150/72  09/08/20 (!) 170/82  08/10/20 (!) 170/78   Pulse Readings from Last 3 Encounters:  10/05/20 67  09/08/20 62  08/10/20  (!) 54    Renal function: CrCl cannot be calculated (Patient's most recent lab result is older than the maximum 21 days allowed.).  Past Medical History:  Diagnosis Date  . Arthritis   . Bifascicular block    a. 1st degree AVB/LBBB.  . Cataract   . Chronic systolic CHF (congestive heart failure) ([place])   . COPD (chronic obstructive pulmonary disease) ([place])   . ED (erectile dysfunction)   . Essential hypertension, benign    chris guess  pcp  . GERD (gastroesophageal reflux disease)   . Hypercholesterolemia   . Kidney stone    renal stone  . LBBB (left bundle branch block) 10/2007  . Migraines   . Mild dietary indigestion   . NICM (nonischemic cardiomyopathy) ([place])   . Nonischemic cardiomyopathy ([place])   . Prostate cancer ([place])   . Reflux esophagitis   . Shortness of breath dyspnea    W/ EXERTION   . Spider bite   . Thyroid cancer ([place])   . Tobacco use disorder   . Unspecified hypothyroidism     Current Outpatient Medications on File Prior to Visit  Medication Sig Dispense Refill  . ANORO ELLIPTA 62.5-25 MCG/INH AEPB INHALE 1 PUFF INTO THE LUNGS DAILY 60 each 3  . atorvastatin (LIPITOR) 40 MG tablet Take 1 tablet (40 mg  total) by mouth daily. Please keep upcoming appointment in Dec. 2021. Thank you 90 tablet 0  . carvedilol (COREG) 6.25 MG tablet Take 1 tablet (6.25 mg total) by mouth 2 (two) times daily with a meal. 180 tablet 3  . hydrALAZINE (APRESOLINE) 25 MG tablet Take 1 tablet (25 mg total) by mouth as needed (for SBP over 160). 90 tablet 3  . levothyroxine (SYNTHROID) 200 MCG tablet TAKE 1 TABLET(200 MCG) BY MOUTH DAILY 90 tablet 1  . pantoprazole (PROTONIX) 40 MG tablet TAKE 1 TABLET(40 MG) BY MOUTH DAILY BEFORE BREAKFAST 90 tablet 0  . sacubitril-valsartan (ENTRESTO) 97-103 MG Take 1 tablet by mouth 2 (two) times daily. 60 tablet 11  . spironolactone (ALDACTONE) 25 MG tablet Take 0.5 tablets (12.5 mg total) by mouth every other day. 22 tablet 3   No current  facility-administered medications on file prior to visit.    Allergies  Allergen Reactions  . Doxycycline Other (See Comments)    Esophagitis, severe gi bleed     Assessment/Plan:  1. Hypertension - Blood pressure is above goal of <130/80 in clinic today. Has been at goal at home the last few days. Home cuff has not been calibrated. Will check BMP today. If scr and K stable will increase spironolactone to 25mg  daily. I will call pt tomorrow with final plan. Follow up in clinic in 2 weeks. Patient to bring his log and BP cuff with him.  Thank you,  Ramond Dial, Pharm.D, BCPS, CPP [place]  [address] [place], [place] [address]  Phone: [phone]; Fax: [phone]

## 2020-10-28 ENCOUNTER — Telehealth: Payer: Self-pay | Admitting: Pharmacist

## 2020-10-28 LAB — BASIC METABOLIC PANEL
BUN/Creatinine Ratio: 17 (ref 10–24)
BUN: 18 mg/dL (ref 8–27)
CO2: 27 mmol/L (ref 20–29)
Calcium: 9.5 mg/dL (ref 8.6–10.2)
Chloride: 106 mmol/L (ref 96–106)
Creatinine, Ser: 1.09 mg/dL (ref 0.76–1.27)
GFR calc Af Amer: 75 mL/min/{1.73_m2} (ref >59)
GFR calc non Af Amer: 65 mL/min/{1.73_m2} (ref >59)
Glucose: 90 mg/dL (ref 65–99)
Potassium: 4.2 mmol/L (ref 3.5–5.2)
Sodium: 147 mmol/L — ABNORMAL HIGH (ref 134–144)

## 2020-10-28 MED ORDER — SPIRONOLACTONE 25 MG PO TABS: 25.0000 mg | ORAL_TABLET | Freq: Every day | ORAL | 3 refills | Status: DC

## 2020-10-28 NOTE — Telephone Encounter (Signed)
Scr and K are stable. Na is slightly high. Increase spironolactone to 25mg  daily as planned.

## 2020-10-28 NOTE — Telephone Encounter (Signed)
Patient returned call. He is in agreement with the plan below. Rx already sent. Follow up in office with labs 2/8

## 2020-11-09 NOTE — Progress Notes (Signed)
Patient ID: Arthur [name].                 DOB: Jun 17, 1941                      MRN: [id number]     HPI: Arthur [name]. is a 80 y.o. male referred by Arthur [name] to HTN clinic. PMH is significant for GERD, COPD, cardiomyopathy with no significant CAD on cath 09/02/16, CHF with EF of 35-40%. At last visit with Arthur [name] patients blood pressure was 170/82. Entresto was increased to 97/103mg  twice a day and as needed hydralazine was added. HR was 62.  At last visit with HTN clinic on 1/25 blood pressure was 140/76. Spironolactone was increased to 25mg  daily.  Patient present today for follow up. He denies dizziness, lightheadedness, headache or blurred vision. He has some leg swelling at night but it is gone by the AM. SOB is chronic, but better. Has not needed hydralazine. Home BP is about 140-150's/85-90 with a few BP in the 130's. HR is mostly in the 60's but a few in the 50's. He has a wrist cuff. He brought his cuff with him to clinic today. Clinic reading 150/80 initally, 140/82 on repeat. Home cuff 157/93.  Current HTN meds: Entresto 97/103mg  twice a day, carvedilol 6.25mg  twice a day, spironolactone 25mg  daily, hydralazine 25mg  as needed for SBP>160 Previously tried:  BP goal: <130/801  Family History: The patient's family history includes Colon cancer in his brother; Diabetes in his brother; Heart attack in his father; Skin cancer in his brother.   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  Past Medical History:  Diagnosis Date  . Arthritis   . Bifascicular block    a. 1st degree AVB/LBBB.  . Cataract   . Chronic systolic CHF (congestive heart failure) ([place])   . COPD (chronic obstructive pulmonary disease) ([place])   . ED (erectile dysfunction)   . Essential hypertension, benign    Arthur [name]  pcp  . GERD (gastroesophageal reflux disease)   . Hypercholesterolemia   . Kidney stone    renal stone  . LBBB (left bundle branch block) 10/2007  . Migraines   . Mild dietary indigestion   . NICM (nonischemic cardiomyopathy) ([place])   . Nonischemic cardiomyopathy ([place])   . Prostate cancer ([place])   . Reflux esophagitis   . Shortness of breath dyspnea    W/ EXERTION   . Spider bite   . Thyroid cancer ([place])   . Tobacco use disorder   . Unspecified hypothyroidism     Current Outpatient Medications on File Prior to Visit  Medication Sig Dispense Refill  . ANORO ELLIPTA 62.5-25 MCG/INH AEPB INHALE  1 PUFF INTO THE LUNGS DAILY 60 each 3  . atorvastatin (LIPITOR) 40 MG tablet Take 1 tablet (40 mg total) by mouth daily. Please keep upcoming appointment in Dec. 2021. Thank you 90 tablet 0  . carvedilol (COREG) 6.25 MG tablet Take 1 tablet (6.25 mg total) by mouth 2 (two) times daily with a meal. 180 tablet 3  . hydrALAZINE (APRESOLINE) 25 MG tablet Take 1 tablet (25 mg total) by mouth as needed (for SBP over 160). (Patient not taking: Reported on 10/27/2020) 90 tablet 3  . levothyroxine (SYNTHROID) 200 MCG tablet TAKE 1 TABLET(200 MCG) BY MOUTH DAILY 90 tablet 1  . pantoprazole (PROTONIX) 40 MG tablet TAKE 1 TABLET(40 MG) BY MOUTH DAILY BEFORE BREAKFAST 90 tablet 0  . sacubitril-valsartan (ENTRESTO) 97-103 MG Take 1 tablet by mouth 2 (two) times daily. 60 tablet 11  . spironolactone  (ALDACTONE) 25 MG tablet Take 1 tablet (25 mg total) by mouth daily. 90 tablet 3   No current facility-administered medications on file prior to visit.    Allergies  Allergen Reactions  . Doxycycline Other (See Comments)    Esophagitis, severe gi bleed     Assessment/Plan:  1. Hypertension - Blood pressure is above goal of <130/80 in clinic today. He is on target dose of Entresto and spironolactone. Limited on carvedilol dose due to HR. I do not feel we have room to increase this. Checking a BMP today since we increased spirolactone last time. Although his home BP cuff was found to be quite inaccurate- reading much higher than clinic, his blood pressure in clinic is still above goal. Will await lab results before making medication change. Could titrate spironolactone, depending on his K, add chlorthalidone or amlodipine. Really no benefit of hydralazine/isosorbide in this patient. Could discuss SGLT2 with patient at next visit for GDMT, however he is on medicare and already on Entresto, cost could be an issue.  I have asked patient to get a new BP cuff since his is inacutate. Encouraged him to get an upper arm cuff instead. Will call patient tomorrow with lab results and medication changes. He will need follow up scheduled. He is to see Arthur [name] in about 3 weeks.  Thank you,  Arthur [name], Pharm.D, BCPS, CPP [place]  [address] [place], [place] [address]  Phone: [phone]; Fax: [phone]

## 2020-11-10 ENCOUNTER — Ambulatory Visit (INDEPENDENT_AMBULATORY_CARE_PROVIDER_SITE_OTHER): Payer: PPO | Admitting: Pharmacist

## 2020-11-10 ENCOUNTER — Other Ambulatory Visit: Payer: Self-pay

## 2020-11-10 ENCOUNTER — Telehealth: Payer: Self-pay | Admitting: Pharmacist

## 2020-11-10 VITALS — BP 140/82 | HR 61

## 2020-11-10 DIAGNOSIS — I5022 Chronic systolic (congestive) heart failure: Secondary | ICD-10-CM

## 2020-11-10 DIAGNOSIS — I10 Essential (primary) hypertension: Secondary | ICD-10-CM

## 2020-11-10 LAB — BASIC METABOLIC PANEL
BUN/Creatinine Ratio: 10 (ref 10–24)
BUN: 10 mg/dL (ref 8–27)
CO2: 27 mmol/L (ref 20–29)
Calcium: 9.6 mg/dL (ref 8.6–10.2)
Chloride: 102 mmol/L (ref 96–106)
Creatinine, Ser: 0.96 mg/dL (ref 0.76–1.27)
GFR calc Af Amer: 87 mL/min/{1.73_m2} (ref >59)
GFR calc non Af Amer: 75 mL/min/{1.73_m2} (ref >59)
Glucose: 102 mg/dL — ABNORMAL HIGH (ref 65–99)
Potassium: 5 mmol/L (ref 3.5–5.2)
Sodium: 142 mmol/L (ref 134–144)

## 2020-11-10 MED ORDER — CHLORTHALIDONE 25 MG PO TABS: 12.5000 mg | ORAL_TABLET | Freq: Every day | ORAL | 3 refills | Status: DC

## 2020-11-10 NOTE — Patient Instructions (Addendum)
Please pick up a new blood pressure cuff I recommend an upper arm cuff Continue checking blood pressure at home Continue Entresto 97/103mg  twice a day, carvedilol 6.25mg  twice a day, spironolactone 25mg  daily, hydralazine 25mg  as needed for SBP>160 I will call you tomorrow with your lab results and review medication plan  Call me at [phone] with any questions or concerns

## 2020-11-10 NOTE — Telephone Encounter (Signed)
BMP stable. Will start chlorthalidone since K is on the higher side. Chlorthalidone 12.5mg  daily. Called pt and reviewed. He is in agreement with the plan. Patient will pick up new BP cuff when he picks up his new med. Follow up scheduled for 2/22.

## 2020-11-23 ENCOUNTER — Other Ambulatory Visit: Payer: Self-pay | Admitting: Family Medicine

## 2020-11-23 DIAGNOSIS — E039 Hypothyroidism, unspecified: Secondary | ICD-10-CM

## 2020-11-24 ENCOUNTER — Encounter: Payer: Self-pay | Admitting: Pharmacist

## 2020-11-24 ENCOUNTER — Other Ambulatory Visit: Payer: Self-pay

## 2020-11-24 ENCOUNTER — Ambulatory Visit (INDEPENDENT_AMBULATORY_CARE_PROVIDER_SITE_OTHER): Payer: PPO | Admitting: Pharmacist

## 2020-11-24 VITALS — BP 106/64 | HR 66 | Wt 234.0 lb

## 2020-11-24 DIAGNOSIS — I5022 Chronic systolic (congestive) heart failure: Secondary | ICD-10-CM | POA: Diagnosis not present

## 2020-11-24 LAB — BASIC METABOLIC PANEL
BUN/Creatinine Ratio: 16 (ref 10–24)
BUN: 19 mg/dL (ref 8–27)
CO2: 26 mmol/L (ref 20–29)
Calcium: 9.9 mg/dL (ref 8.6–10.2)
Chloride: 101 mmol/L (ref 96–106)
Creatinine, Ser: 1.22 mg/dL (ref 0.76–1.27)
GFR calc Af Amer: 65 mL/min/{1.73_m2} (ref >59)
GFR calc non Af Amer: 56 mL/min/{1.73_m2} — ABNORMAL LOW (ref >59)
Glucose: 108 mg/dL — ABNORMAL HIGH (ref 65–99)
Potassium: 4.8 mmol/L (ref 3.5–5.2)
Sodium: 143 mmol/L (ref 134–144)

## 2020-11-24 NOTE — Patient Instructions (Addendum)
It was nice meeting you today  We would like your blood pressure to be less than 130/80  Since you are feeling weak and dizzy, lets stop the chlorthalidone 12.5mg  for now.  Continue monitoring your blood pressure at home.  If you see it start to go up closer to 130/80, please call and let us know  Continue your Entresto 97-103 twice a day Continue your carvedilol 6.25mg  twice a day Continue your spironolactone 25mg  once a day Continue your hydralazine 25mg  if the top number of your blood pressure is greater than 160  Continue weighing yourself at home.  We will see you in 1 month after your appointment with Norvel Richards, PharmD, BCACP, [place], [place] [address] [place], [place] [address] Phone: [phone]; Fax: [phone] 11/24/2020 8:46 AM

## 2020-11-24 NOTE — Progress Notes (Signed)
Patient ID: Arthur [name].                 DOB: 12-12-1940                      MRN: [id number]     HPI: Arthur [name]. is a 80 y.o. male referred by [name] for CHF/HTN management.  linic. PMH is significant for HTN, LBBB, aortic atherosclerosis, and COPD. EF 35-40%.  Medications have been titrated up slowly over last 2 months.  At last visit with PharmD patient brought wrist cuff and it was found to read significantly higher than clinic cuff. Patient was encouraged to purchase upper arm cuff.  Has follow up scheduled with Kathyrn Drown in April.  Due to bump in potassium after spironolactone was increased, chlorthalidone was introduced at last visit.  Patient presents today in good spirits.  Today is his 59th wedding anniversary.  Purchased an [employer] BP cuff and has been taking home BP measurements ocne or twice a day.  Systolic blood pressure ranging from 40J-811B, diastolic BP ranging from 14N-82N.  Takes his chlorthalidone at night because he wears a pad so it does not bother him if he urinates.  Frequently does due to history of prostate cancer treatment.  Has not needed his hydralazine.  Weighs himself about ocne a week and reports weight is steady.  Reports occasional weakness, dizziness, and sluggishness. Last Friday night while dancing he became dizzy and friends had to sit him down to recover. 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    Home BP cuff verified in room. Home Cuff reading: 103/62, pulse 65  Current HTN meds: carvedilol 6.25mg  BID, chlorthaludone 12.5mg  daily, hydralazine 25mg  prn for systolic >562, Entresto 13-086 BID, spironolactone 25mg  daily  BP goal: <130/80  Wt Readings from Last 3 Encounters:  10/05/20 235 lb 12.8 oz (107 kg)  09/08/20 234 lb 6.4 oz (106.3 kg)  08/10/20 234 lb 12.8 oz (106.5 kg)   BP Readings from Last 3 Encounters:  11/10/20 140/82  10/27/20 140/76  10/05/20 (!) 150/72   Pulse Readings from Last 3 Encounters:  11/10/20  61  10/27/20 66  10/05/20 67    Renal function: CrCl cannot be calculated (Unknown ideal weight.).  Past Medical History:  Diagnosis Date  . Arthritis   . Bifascicular block    a. 1st degree AVB/LBBB.  . Cataract   . Chronic systolic CHF (congestive heart failure) ([place])   . COPD (chronic obstructive pulmonary disease) ([place])   . ED (erectile dysfunction)   . Essential hypertension, benign    chris guess  pcp  . GERD (gastroesophageal reflux disease)   . Hypercholesterolemia   . Kidney stone    renal stone  . LBBB (left bundle branch block) 10/2007  . Migraines   . Mild dietary indigestion   . NICM (nonischemic cardiomyopathy) ([place])   . Nonischemic cardiomyopathy ([place])   . Prostate cancer ([place])   . Reflux esophagitis   . Shortness of breath dyspnea    W/ EXERTION   . Spider bite   . Thyroid cancer ([place])   . Tobacco use disorder   . Unspecified hypothyroidism     Current Outpatient Medications on File Prior to Visit  Medication Sig Dispense Refill  . ANORO ELLIPTA 62.5-25 MCG/INH AEPB INHALE 1 PUFF INTO THE LUNGS DAILY 60 each 3  . atorvastatin (LIPITOR) 40 MG tablet Take 1 tablet (40 mg total) by mouth  daily. Please keep upcoming appointment in Dec. 2021. Thank you 90 tablet 0  . carvedilol (COREG) 6.25 MG tablet Take 1 tablet (6.25 mg total) by mouth 2 (two) times daily with a meal. 180 tablet 3  . chlorthalidone (HYGROTON) 25 MG tablet Take 0.5 tablets (12.5 mg total) by mouth daily. 45 tablet 3  . hydrALAZINE (APRESOLINE) 25 MG tablet Take 1 tablet (25 mg total) by mouth as needed (for SBP over 160). (Patient not taking: Reported on 10/27/2020) 90 tablet 3  . levothyroxine (SYNTHROID) 200 MCG tablet TAKE 1 TABLET(200 MCG) BY MOUTH DAILY 90 tablet 1  . pantoprazole (PROTONIX) 40 MG tablet TAKE 1 TABLET(40 MG) BY MOUTH DAILY BEFORE BREAKFAST 90 tablet 0  . sacubitril-valsartan (ENTRESTO) 97-103 MG Take 1 tablet by mouth 2 (two) times daily. 60 tablet 11  . spironolactone  (ALDACTONE) 25 MG tablet Take 1 tablet (25 mg total) by mouth daily. 90 tablet 3   No current facility-administered medications on file prior to visit.    Allergies  Allergen Reactions  . Doxycycline Other (See Comments)    Esophagitis, severe gi bleed     Assessment/Plan:  1. HTN/CHF- BP in room today 106/64 which is very similar to his new home machine and at goal of <130/80.  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  Will hold chlorthalidone at this time and instructed patient to continue monitoring BP at home and to let clinic know if BP begins to trend back up closer to 130/80. Can restart chlorthalidone if needed.  Patient voiced understanding. Will recheck BMP at this time to check renal function and electrolytes.  Has appt with Kathyrn Drown in 2 weeks, will recheck in clinic in 4 weeks.  Continue 97-103mg  BID Continue spironolactone 25mg  daily Continue carvedilol 6.25mg  BID Continue hydralazine 25mg  if systolic BP >829 STOP chlorthalidone 12.5mg  at this time Check BMP today Recheck in 4 weeks   Karren Cobble, PharmD, BCACP, CDCES, [place] [address], [place], [place] [address] Phone: [phone]; Fax: [phone] 11/24/2020 9:07 AM

## 2020-11-25 ENCOUNTER — Telehealth: Payer: Self-pay | Admitting: Pharmacist

## 2020-11-25 NOTE — Telephone Encounter (Signed)
Spoke with pt and relayed lab work.  Chlorthalidone d/c yesterday which may help renal function.  BP low overnight, patient will continue to monitor.  Reports he feels fine.  Last night systolic BP was 89, this morning BP was 98/64 pulse of 66.  Had already taken morning medications.  Instructed patient to continue to monitor home BP and if it does not rise, may need to decrease meds further.

## 2020-11-30 ENCOUNTER — Other Ambulatory Visit: Payer: Self-pay | Admitting: Pulmonary Disease
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
## 2020-12-09 ENCOUNTER — Encounter: Payer: Self-pay | Admitting: Cardiology

## 2020-12-09 ENCOUNTER — Ambulatory Visit: Payer: PPO | Admitting: Cardiology

## 2020-12-09 ENCOUNTER — Other Ambulatory Visit: Payer: Self-pay

## 2020-12-09 ENCOUNTER — Other Ambulatory Visit: Payer: Self-pay | Admitting: Cardiovascular Disease

## 2020-12-09 VITALS — BP 122/68 | HR 62 | Ht 72.0 in | Wt 237.0 lb

## 2020-12-09 DIAGNOSIS — E7800 Pure hypercholesterolemia, unspecified: Secondary | ICD-10-CM | POA: Diagnosis not present

## 2020-12-09 DIAGNOSIS — I428 Other cardiomyopathies: Secondary | ICD-10-CM | POA: Diagnosis not present

## 2020-12-09 DIAGNOSIS — I420 Dilated cardiomyopathy: Secondary | ICD-10-CM | POA: Diagnosis not present

## 2020-12-09 DIAGNOSIS — I700 Atherosclerosis of aorta: Secondary | ICD-10-CM

## 2020-12-09 DIAGNOSIS — I10 Essential (primary) hypertension: Secondary | ICD-10-CM

## 2020-12-09 LAB — COMPREHENSIVE METABOLIC PANEL
ALT: 19 IU/L (ref 0–44)
AST: 14 IU/L (ref 0–40)
Albumin/Globulin Ratio: 1.5 (ref 1.2–2.2)
Albumin: 4 g/dL (ref 3.7–4.7)
Alkaline Phosphatase: 105 IU/L (ref 44–121)
BUN/Creatinine Ratio: 11 (ref 10–24)
BUN: 12 mg/dL (ref 8–27)
Bilirubin Total: 0.6 mg/dL (ref 0.0–1.2)
CO2: 25 mmol/L (ref 20–29)
Calcium: 9.4 mg/dL (ref 8.6–10.2)
Chloride: 105 mmol/L (ref 96–106)
Creatinine, Ser: 1.11 mg/dL (ref 0.76–1.27)
Globulin, Total: 2.6 g/dL (ref 1.5–4.5)
Glucose: 102 mg/dL — ABNORMAL HIGH (ref 65–99)
Potassium: 5 mmol/L (ref 3.5–5.2)
Sodium: 143 mmol/L (ref 134–144)
Total Protein: 6.6 g/dL (ref 6.0–8.5)
eGFR: 68 mL/min/{1.73_m2} (ref >59)

## 2020-12-09 LAB — LIPID PANEL
Chol/HDL Ratio: 3.1 ratio (ref 0.0–5.0)
Cholesterol, Total: 116 mg/dL (ref 100–199)
HDL: 37 mg/dL — ABNORMAL LOW (ref >39)
LDL Chol Calc (NIH): 58 mg/dL (ref 0–99)
Triglycerides: 117 mg/dL (ref 0–149)
VLDL Cholesterol Cal: 21 mg/dL (ref 5–40)

## 2020-12-09 NOTE — Patient Instructions (Signed)
Medication Instructions:  Your physician recommends that you continue on your current medications as directed. Please refer to the Current Medication list given to you today.  *If you need a refill on your cardiac medications before your next appointment, please call your pharmacy*   Lab Work: TODAY: CMET, LIPIDS If you have labs (blood work) drawn today and your tests are completely normal, you will receive your results only by: [name] MyChart Message (if you have MyChart) OR . A paper copy in the mail If you have any lab test that is abnormal or we need to change your treatment, we will call you to review the results.   Testing/Procedures: Your physician has requested that you have an echocardiogram. Echocardiography is a painless test that uses sound waves to create images of your heart. It provides your doctor with information about the size and shape of your heart and how well your heart's chambers and valves are working. This procedure takes approximately one hour. There are no restrictions for this procedure.  Follow-Up: At [facility], you and your health needs are our priority.  As part of our continuing mission to provide you with exceptional heart care, we have created designated Provider Care Teams.  These Care Teams include your primary Cardiologist (physician) and Advanced Practice Providers (APPs -  Physician Assistants and Nurse Practitioners) who all work together to provide you with the care you need, when you need it.  We recommend signing up for the patient portal called "MyChart".  Sign up information is provided on this After Visit Summary.  MyChart is used to connect with patients for Virtual Visits (Telemedicine).  Patients are able to view lab/test results, encounter notes, upcoming appointments, etc.  Non-urgent messages can be sent to your provider as well.   To learn more about what you can do with MyChart, go to [email or web address].    Your next appointment:   6  month(s)  The format for your next appointment:   In Person  Provider:   You may see Jenkins Rouge, MD or one of the following Advanced Practice Providers on your designated Care Team:    Kathyrn Drown, NP    Other Instructions  Echocardiogram An echocardiogram is a test that uses sound waves (ultrasound) to produce images of the heart. Images from an echocardiogram can provide important information about:  Heart size and shape.  The size and thickness and movement of your heart's walls.  Heart muscle function and strength.  Heart valve function or if you have stenosis. Stenosis is when the heart valves are too narrow.  If blood is flowing backward through the heart valves (regurgitation).  A tumor or infectious growth around the heart valves.  Areas of heart muscle that are not working well because of poor blood flow or injury from a heart attack.  Aneurysm detection. An aneurysm is a weak or damaged part of an artery wall. The wall bulges out from the normal force of blood pumping through the body. Tell a health care provider about:  Any allergies you have.  All medicines you are taking, including vitamins, herbs, eye drops, creams, and over-the-counter medicines.  Any blood disorders you have.  Any surgeries you have had.  Any medical conditions you have.  Whether you are pregnant or may be pregnant. What are the risks? Generally, this is a safe test. However, problems may occur, including an allergic reaction to dye (contrast) that may be used during the test. What happens before the test? No  specific preparation is needed. You may eat and drink normally. What happens during the test?  You will take off your clothes from the waist up and put on a hospital gown.  Electrodes or electrocardiogram (ECG)patches may be placed on your chest. The electrodes or patches are then connected to a device that monitors your heart rate and rhythm.  You will lie down on a  table for an ultrasound exam. A gel will be applied to your chest to help sound waves pass through your skin.  A handheld device, called a transducer, will be pressed against your chest and moved over your heart. The transducer produces sound waves that travel to your heart and bounce back (or "echo" back) to the transducer. These sound waves will be captured in real-time and changed into images of your heart that can be viewed on a video monitor. The images will be recorded on a computer and reviewed by your health care provider.  You may be asked to change positions or hold your breath for a short time. This makes it easier to get different views or better views of your heart.  In some cases, you may receive contrast through an IV in one of your veins. This can improve the quality of the pictures from your heart. The procedure may vary among health care providers and hospitals.   What can I expect after the test? You may return to your normal, everyday life, including diet, activities, and medicines, unless your health care provider tells you not to do that. Follow these instructions at home:  It is up to you to get the results of your test. Ask your health care provider, or the department that is doing the test, when your results will be ready.  Keep all follow-up visits. This is important. Summary  An echocardiogram is a test that uses sound waves (ultrasound) to produce images of the heart.  Images from an echocardiogram can provide important information about the size and shape of your heart, heart muscle function, heart valve function, and other possible heart problems.  You do not need to do anything to prepare before this test. You may eat and drink normally.  After the echocardiogram is completed, you may return to your normal, everyday life, unless your health care provider tells you not to do that. This information is not intended to replace advice given to you by your health care  provider. Make sure you discuss any questions you have with your health care provider. Document Revised: 05/12/2020 Document Reviewed: 05/12/2020 Elsevier Patient Education  2021 [employer].

## 2020-12-21 NOTE — Progress Notes (Signed)
Patient ID: Arthur [name].                 DOB: 1940-12-15                      MRN: [id number]     HPI: Arthur [name]. is a 80 y.o. male referred by [name] for CHF/HTN management clinic. PMH is significant for non-ischemic dilated cardiomyopathy with EF at 35-40% on echo 01/23/20 on Entresto, HTN, GERD, LBBB, HLD, COPD with tobacco use and pulmonary nodules. He underwent LHC 09/02/2016 after an episode of syncope that showed no significant CAD felt not to be cardiac in etiology. He wore an event monitor that showed no arrhythmia or high grade AV block 06/28/16. Medications have been titrated up slowly over last 2 months.  At PharmD visit on 2/8, BP was 140/82 and chlorthalidone was started after potassium bumped secondary to increasing spironolactone. At last PharmD visit on 11/24/20, he had purchased an Omron BP cuff after wrist cuff was found to read significantly higher than clinic cuff at previous visit. He reported taking chlorthalidone at night because he wears a pad so it does not bother him if he urinates. Frequently does due to history of prostate cancer treatment. Has not needed his hydralazine. Weighs himself daily which is stable. Had one instance of dizziness while out dancing which improved with sitting down. 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 Due to this and BP in office being 106/64 (validated with home cuff reading 103/62) as well as slight bump in Scr, chlorthalidone was discontinued.   Patient recently seen by Kathyrn Drown, NP on 3/9. BP was 122/68. He was not volume overloaded, denied HF symptoms. Current medications were continued.   Today, patient arrives in good spirits, bringing his medication list and BP cuff. His weight is up 5 lbs from last visit 2 weeks ago. Reports he has been having some swelling and weight gain over the last week. His shortness of breath is unchanged from his baseline. He had [employer] ravioli last night and reports he has  been drinking 1 beer/night for the past week, which he hasn't done in years. Denies dizziness, headaches, blurred vision. Home BP readings have been elevated from last visit with multiple systolic BP in 937J, highest reading 150/84. HR in 50s-60s. Using patient's BP cuff in office, BP was 156/80. Rechecked with office cuff, 142/70.   Current HTN meds: Entresto 97/103 mg BID, carvedilol 6.25 mg BID, spironolactone 25 mg daily  Previously tried: chlorthalidone 12.5 mg daily (hypotension, dizziness, Scr bump)  BP goal: <130/93mmHg  Family History: The patient's family history includes Colon cancer in his brother; Diabetes in his brother; Heart attack in his father; Skin cancer in his brother.   Social History: former smoker (quit 2 years ago), no alcohol  Diet: couple of cokes a day, 2 cups of coffee per week, monster energy every once and awhile -Yesterday: breakfast chicken salad sandwich, no lunch, [employer] ravioli; has been drinking 1 beer/night for the last week  Exercise: walks every once a while, driving an excavator, dancing every Friday  Home BP readings: Uses Omron bicep cuff Yesterday 141/71, HR 63; 114/69, 122/69, 131/65, 111/63, 150/84, 135/79, 131/64, 134/76, 134/70, 91/50, 94/53, 85/52, 113/60  Labs:  10/27/20: Scr 1.09, Na 147, K 4.2 11/10/20: Scr 0.96, Na 142, K 5.0 (after increasing spironolactone to 25 mg daily) 11/24/20: Scr 1.22, Na 143, K 4.8 (after starting  chlorthalidone 12.5 mg daily) 12/09/20: Scr 1.11, Na 143, K 5.0 (after stopping chlorthalidone)  Wt Readings from Last 3 Encounters:  12/09/20 237 lb (107.5 kg)  11/24/20 234 lb (106.1 kg)  10/05/20 235 lb 12.8 oz (107 kg)   BP Readings from Last 3 Encounters:  12/09/20 122/68  11/24/20 106/64  11/10/20 140/82   Pulse Readings from Last 3 Encounters:  12/09/20 62  11/24/20 66  11/10/20 61    Renal function: Estimated Creatinine Clearance: 69.5 mL/min (by C-G formula based on SCr of 1.11  mg/dL).  Past Medical History:  Diagnosis Date  . Arthritis   . Bifascicular block    a. 1st degree AVB/LBBB.  . Cataract   . Chronic systolic CHF (congestive heart failure) ([place])   . COPD (chronic obstructive pulmonary disease) ([place])   . ED (erectile dysfunction)   . Essential hypertension, benign    chris guess  pcp  . GERD (gastroesophageal reflux disease)   . Hypercholesterolemia   . Kidney stone    renal stone  . LBBB (left bundle branch block) 10/2007  . Migraines   . Mild dietary indigestion   . NICM (nonischemic cardiomyopathy) ([place])   . Nonischemic cardiomyopathy ([place])   . Prostate cancer ([place])   . Reflux esophagitis   . Shortness of breath dyspnea    W/ EXERTION   . Spider bite   . Thyroid cancer ([place])   . Tobacco use disorder   . Unspecified hypothyroidism     Current Outpatient Medications on File Prior to Visit  Medication Sig Dispense Refill  . ANORO ELLIPTA 62.5-25 MCG/INH AEPB INHALE 1 PUFF INTO THE LUNGS DAILY 60 each 3  . atorvastatin (LIPITOR) 40 MG tablet TAKE 1 TABLET BY MOUTH EVERY DAY 90 tablet 3  . carvedilol (COREG) 6.25 MG tablet Take 1 tablet (6.25 mg total) by mouth 2 (two) times daily with a meal. 180 tablet 3  . levothyroxine (SYNTHROID) 200 MCG tablet TAKE 1 TABLET(200 MCG) BY MOUTH DAILY 90 tablet 1  . pantoprazole (PROTONIX) 40 MG tablet TAKE 1 TABLET(40 MG) BY MOUTH DAILY BEFORE BREAKFAST 90 tablet 0  . sacubitril-valsartan (ENTRESTO) 97-103 MG Take 1 tablet by mouth 2 (two) times daily. 60 tablet 11  . spironolactone (ALDACTONE) 25 MG tablet Take 1 tablet (25 mg total) by mouth daily. 90 tablet 3   No current facility-administered medications on file prior to visit.    Allergies  Allergen Reactions  . Doxycycline Other (See Comments)    Esophagitis, severe gi bleed     Assessment/Plan:  1. HTN/CHF-  Blood pressure is above goal of <130/80 in clinic today. His weight is up 5lbs from last visit 2 weeks ago, home BP readings have  been elevated from last visit as well, and he reports increased swelling over the last week. Suspect this may be due to eating some canned foods high in sodium and drinking 1 beer/night for the past week. Will start furosemide 40 mg daily x 3 days, then 20 mg daily until follow up. This should also help normalize his elevated potassium levels. Patient is on goal dose of Entresto and spironolactone; carvedilol dose titration is limited by HR 50s-60s. Continue Entresto 97-103 mg BID, spironolactone 25 mg daily, and carvedilol 6.25mg  BID. Counseled patient to limit beer and canned food intake. Continue checking weights and BP daily. Encouraged patient to call if he experiences any dizziness, low BP, or if his weight continues to increase. Follow up in 3 weeks for BP/weight  check and BMET. Would consider addition of SGLT2i for CHF benefit if not cost prohibitive (Jardiance is on formulary as a Tier 3 - $45/1 month or $90/3 month supply).  Rebbeca Paul, PharmD PGY1 Pharmacy Resident 12/22/2020 9:28 AM

## 2020-12-22 ENCOUNTER — Other Ambulatory Visit: Payer: Self-pay

## 2020-12-22 ENCOUNTER — Ambulatory Visit (INDEPENDENT_AMBULATORY_CARE_PROVIDER_SITE_OTHER): Payer: PPO | Admitting: Student-PharmD

## 2020-12-22 VITALS — BP 142/70 | HR 55 | Wt 242.0 lb

## 2020-12-22 DIAGNOSIS — I5022 Chronic systolic (congestive) heart failure: Secondary | ICD-10-CM

## 2020-12-22 MED ORDER — ENTRESTO 97-103 MG PO TABS: 1.0000 | ORAL_TABLET | Freq: Two times a day (BID) | ORAL | 3 refills | Status: DC

## 2020-12-22 MED ORDER — FUROSEMIDE 20 MG PO TABS: 20.0000 mg | ORAL_TABLET | Freq: Every day | ORAL | 5 refills | Status: DC

## 2020-12-22 NOTE — Patient Instructions (Addendum)
It was nice to see you today!  Your goal blood pressure is less than 130/80 mmHg. In clinic, your blood pressure was 142/70 mmHg.  Medication Changes: Begin Lasix: take 2 tablets (40 mg) for the first three days, then 1 tablet (20 mg) daily after that. Call us if your blood pressure drops too low or if you have any dizziness or if your weight continues to go up.   Continue current doses of Entresto, carvedilol, spironolactone  We will need to see you back in 3 weeks to check your BP, weight, and labs.   Monitor blood pressure at home daily and keep a log (on your phone or piece of paper) to bring with you to your next visit. Write down date, time, blood pressure and pulse.  Keep up the good work with diet and exercise. Aim for a diet full of vegetables, fruit and lean meats (chicken, [place], fish). Try to limit salt intake by eating fresh or frozen vegetables (instead of canned), rinse canned vegetables prior to cooking and do not add any additional salt to meals.   Please give [place] a call at [phone] with any questions or concerns.

## 2020-12-31 ENCOUNTER — Other Ambulatory Visit: Payer: Self-pay

## 2020-12-31 ENCOUNTER — Ambulatory Visit (HOSPITAL_COMMUNITY): Payer: PPO | Attending: Cardiovascular Disease

## 2020-12-31 DIAGNOSIS — I10 Essential (primary) hypertension: Secondary | ICD-10-CM | POA: Diagnosis not present

## 2020-12-31 DIAGNOSIS — I700 Atherosclerosis of aorta: Secondary | ICD-10-CM | POA: Diagnosis not present

## 2020-12-31 DIAGNOSIS — I428 Other cardiomyopathies: Secondary | ICD-10-CM | POA: Insufficient documentation

## 2020-12-31 DIAGNOSIS — I420 Dilated cardiomyopathy: Secondary | ICD-10-CM | POA: Insufficient documentation

## 2020-12-31 LAB — ECHOCARDIOGRAM COMPLETE
Area-P 1/2: 2.2 cm2
P 1/2 time: 1215 msec
S' Lateral: 3.6 cm

## 2021-01-01 ENCOUNTER — Other Ambulatory Visit: Payer: Self-pay | Admitting: Internal Medicine

## 2021-01-01 ENCOUNTER — Other Ambulatory Visit: Payer: Self-pay | Admitting: Cardiovascular Disease

## 2021-01-01 DIAGNOSIS — I10 Essential (primary) hypertension: Secondary | ICD-10-CM

## 2021-01-12 ENCOUNTER — Other Ambulatory Visit: Payer: Self-pay

## 2021-01-12 ENCOUNTER — Other Ambulatory Visit: Payer: PPO

## 2021-01-12 ENCOUNTER — Ambulatory Visit (INDEPENDENT_AMBULATORY_CARE_PROVIDER_SITE_OTHER): Payer: PPO | Admitting: Pharmacist

## 2021-01-12 VITALS — BP 138/72 | HR 59 | Wt 236.0 lb

## 2021-01-12 DIAGNOSIS — I5022 Chronic systolic (congestive) heart failure: Secondary | ICD-10-CM

## 2021-01-12 LAB — BASIC METABOLIC PANEL
BUN/Creatinine Ratio: 16 (ref 10–24)
BUN: 18 mg/dL (ref 8–27)
CO2: 24 mmol/L (ref 20–29)
Calcium: 9 mg/dL (ref 8.6–10.2)
Chloride: 102 mmol/L (ref 96–106)
Creatinine, Ser: 1.11 mg/dL (ref 0.76–1.27)
Glucose: 116 mg/dL — ABNORMAL HIGH (ref 65–99)
Potassium: 4.3 mmol/L (ref 3.5–5.2)
Sodium: 142 mmol/L (ref 134–144)
eGFR: 68 mL/min/{1.73_m2} (ref >59)

## 2021-01-12 NOTE — Progress Notes (Signed)
Patient ID: Arthur [name].                 DOB: 1941-01-18                      MRN: [id number]     HPI: Arthur [name]. is a 80 y.o. male referred by [name]  For CHF management. clinic. PMH is significant for non-ischemic dilated cardiomyopathy with EFat 35-40%on echo 01/23/20 on Entresto, HTN, GERD, LBBB, HLD, COPDwith tobacco useand pulmonary nodules.  Had repeat echo on 12/31/20 and EF has improved to 52%.  Patient recently seen by Kathyrn Drown, NP on 3/9. BP was 122/68. He was not volume overloaded, denied HF symptoms. Current medications were continued.   Has reduced the amount of alcohol he is drinking however still eats mostly pre prepared and/or processed foods since his wife passed away.    Uses Omron cuff daily now and brought in cuff to review readings. Last 6 readings:  99/56 125/70 131/76 119/69 120/59 122/68  Has lost 6 pounds since last office visit.    Current CHF meds: carvedilol 6.25mg  BID, furosemide 20mg  daily, Entresto 97-103 BID, spironolactone 25mg  daily  Previously tried: chlorthalidone 12.5mg , losartan/HCTZ 100/25  BP goal: <130/80  Social History: only had one beer recently  Diet:  1 country ham biscuit, chicken and rice bowl from [employer]  Home BP readings:   Wt Readings from Last 3 Encounters:  12/22/20 242 lb (109.8 kg)  12/09/20 237 lb (107.5 kg)  11/24/20 234 lb (106.1 kg)   BP Readings from Last 3 Encounters:  12/22/20 (!) 142/70  12/09/20 122/68  11/24/20 106/64   Pulse Readings from Last 3 Encounters:  12/22/20 (!) 55  12/09/20 62  11/24/20 66    Renal function: CrCl cannot be calculated (Patient's most recent lab result is older than the maximum 21 days allowed.).  Past Medical History:  Diagnosis Date  . Arthritis   . Bifascicular block    a. 1st degree AVB/LBBB.  . Cataract   . Chronic systolic CHF (congestive heart failure) ([place])   . COPD (chronic obstructive pulmonary disease) ([place])   . ED (erectile  dysfunction)   . Essential hypertension, benign    chris guess  pcp  . GERD (gastroesophageal reflux disease)   . Hypercholesterolemia   . Kidney stone    renal stone  . LBBB (left bundle branch block) 10/2007  . Migraines   . Mild dietary indigestion   . NICM (nonischemic cardiomyopathy) ([place])   . Nonischemic cardiomyopathy ([place])   . Prostate cancer ([place])   . Reflux esophagitis   . Shortness of breath dyspnea    W/ EXERTION   . Spider bite   . Thyroid cancer ([place])   . Tobacco use disorder   . Unspecified hypothyroidism     Current Outpatient Medications on File Prior to Visit  Medication Sig Dispense Refill  . ANORO ELLIPTA 62.5-25 MCG/INH AEPB INHALE 1 PUFF INTO THE LUNGS DAILY 60 each 3  . atorvastatin (LIPITOR) 40 MG tablet TAKE 1 TABLET BY MOUTH EVERY DAY 90 tablet 3  . carvedilol (COREG) 6.25 MG tablet TAKE 1 TABLET(6.25 MG) BY MOUTH TWICE DAILY WITH A MEAL 180 tablet 3  . furosemide (LASIX) 20 MG tablet Take 1 tablet (20 mg total) by mouth daily. 30 tablet 5  . levothyroxine (SYNTHROID) 200 MCG tablet TAKE 1 TABLET(200 MCG) BY MOUTH DAILY 90 tablet 1  . pantoprazole (PROTONIX) 40  MG tablet TAKE 1 TABLET(40 MG) BY MOUTH DAILY BEFORE BREAKFAST 90 tablet 0  . sacubitril-valsartan (ENTRESTO) 97-103 MG Take 1 tablet by mouth 2 (two) times daily. 180 tablet 3  . spironolactone (ALDACTONE) 25 MG tablet Take 1 tablet (25 mg total) by mouth daily. 90 tablet 3   No current facility-administered medications on file prior to visit.    Allergies  Allergen Reactions  . Doxycycline Other (See Comments)    Esophagitis, severe gi bleed     Assessment/Plan:  1. CHF - Patient BP in room today 138/72 which is above goal of <130/80, however majority of home readings are at goal.  Patient already on max dose Entresto and do not think carvedilol can be safely increased due to pulse rate of 59 today.  Patient's next step would likely be SGLT2i.  Reassuring his EF has recovered recently.     Is due for lab work today. Will contact patient with results and discuss possible addition of Jardiance/Farxiga.  Patient voiced understanding.  Continue Entresto 97-103mg  BID Continue furosemide 20mg  daily Continue spironolactone 25mg  daily Continue carvedilol 6.25mg  BID Continue checking BP and weight daily Recheck in 1 month  Karren Cobble, PharmD, BCACP, CDCES, [place] [address], [place], [place] [address] Phone: [phone]; Fax: [phone] 01/12/2021 4:53 PM

## 2021-01-12 NOTE — Patient Instructions (Addendum)
It was good seeing you again!  We would like your blood pressure to be less than 130/80.  Many of your home readings are at goal.  We checked your lab work today to check your kidneys and electrolytes. We will call you tomorrow with the results.  Continue your Entresto 97-103 twice a day Continue your carvedilol 6.25mg  twice a day Continue your spironolactone 25mg  once a day Continue your furosemide 20mg  daily  Try to avoid salty and processed foods  Please call with any questions   Karren Cobble, PharmD, BCACP, CDCES, [place] [address] [place], [place] [address] Phone: [phone]; Fax: [phone] 01/12/2021 8:23 AM

## 2021-01-15 ENCOUNTER — Other Ambulatory Visit: Payer: Self-pay | Admitting: Internal Medicine

## 2021-01-19 ENCOUNTER — Other Ambulatory Visit: Payer: Self-pay | Admitting: Internal Medicine

## 2021-02-04 DIAGNOSIS — H04123 Dry eye syndrome of bilateral lacrimal glands: Secondary | ICD-10-CM | POA: Diagnosis not present

## 2021-02-04 DIAGNOSIS — Z961 Presence of intraocular lens: Secondary | ICD-10-CM | POA: Diagnosis not present

## 2021-02-05 ENCOUNTER — Encounter: Payer: Self-pay | Admitting: Pulmonary Disease

## 2021-02-05 ENCOUNTER — Other Ambulatory Visit: Payer: Self-pay

## 2021-02-05 ENCOUNTER — Ambulatory Visit: Payer: PPO | Admitting: Pulmonary Disease

## 2021-02-05 DIAGNOSIS — R918 Other nonspecific abnormal finding of lung field: Secondary | ICD-10-CM

## 2021-02-05 DIAGNOSIS — F17200 Nicotine dependence, unspecified, uncomplicated: Secondary | ICD-10-CM | POA: Diagnosis not present

## 2021-02-05 DIAGNOSIS — J432 Centrilobular emphysema: Secondary | ICD-10-CM | POA: Diagnosis not present

## 2021-02-05 MED ORDER — TRELEGY ELLIPTA 100-62.5-25 MCG/INH IN AEPB: 1.0000 | INHALATION_SPRAY | Freq: Every day | RESPIRATORY_TRACT | 0 refills | Status: DC

## 2021-02-05 NOTE — Progress Notes (Signed)
   Subjective:    Patient ID: [name], male    DOB: Aug 04, 1941, 80 y.o.   MRN: [id number]  HPI  80 yo smoker , followed for COPD and multiple pulmonary nodules PMH -HFrEF (EF 35 % )  He races modified cars as a hobby  Reports shortness of breath which she attributes to the hot weather. Complains of dry cough during pollen season. His blood pressure has been fluctuating he follows to weekly with cardiology  EF has actually improved on last echo to 52% 12/2020 He occasionally feels dizzy when he coughs hard, note that he is on Coreg and Entresto and spironolactone.  Labs from 4/22 reviewed, potassium 4.3, creatinine 1.1  Remains on Anoro, complains of pedal edema by evening  Significant tests/ events reviewed 2D Echo 05/2016 Moderate to severe global reduction in LV function; mild LVH; grade 1 diastolic dysfunction with elevated LV filling pressure; mild biatrial enlargement; trace AI, MR and TR. EF 30-35%  Spirometry 2016 >FEV1 66% , ratio 63.  CT chest 09/2015-nodule stable or decreased compared to 02/2015 Ldct 08/2019 stable nodules  Review of Systems neg for any significant sore throat, dysphagia, itching, sneezing, nasal congestion or excess/ purulent secretions, fever, chills, sweats, unintended wt loss, pleuritic or exertional cp, hempoptysis, orthopnea pnd or change in chronic leg swelling. Also denies presyncope, palpitations, heartburn, abdominal pain, nausea, vomiting, diarrhea or change in bowel or urinary habits, dysuria,hematuria, rash, arthralgias, visual complaints, headache, numbness weakness or ataxia.     Objective:   Physical Exam   Gen. Pleasant, well-nourished,elderly, in no distress ENT - no thrush, no pallor/icterus,no post nasal drip Neck: No JVD, no thyromegaly, no carotid bruits Lungs: no use of accessory muscles, no dullness to percussion, clear without rales or rhonchi  Cardiovascular: Rhythm regular, heart sounds  normal, no  murmurs or gallops, no peripheral edema Musculoskeletal: No deformities, no cyanosis or clubbing         Assessment & Plan:

## 2021-02-05 NOTE — Assessment & Plan Note (Signed)
We will step him up from Anoro to triple therapy with Trelegy, he was given a sample and he will call us back for prescription as needed. If he develops wheezing, he will call for prescription for prednisone

## 2021-02-05 NOTE — Assessment & Plan Note (Signed)
Stable on serial CT

## 2021-02-05 NOTE — Patient Instructions (Signed)
Sample of Trelegy 100 once daily, rinse mouth after use. Use instead of Anoro and call us back if this works in 2 weeks for prescription

## 2021-02-05 NOTE — Assessment & Plan Note (Signed)
Smoking cessation was again emphasized ?

## 2021-02-08 NOTE — Progress Notes (Signed)
Patient ID: Arthur [name].                 DOB: 02-22-1941                      MRN: [id number]     HPI: Arthur [name]. is a 80 y.o. male referred by [name]  For CHF management. clinic. PMH is significant for non-ischemic dilated cardiomyopathy with EFat 35-40%on echo 01/23/20 on Entresto, HTN, GERD, LBBB, HLD, COPDwith tobacco useand pulmonary nodules.  Had repeat echo on 12/31/20 and EF has improved to 52%.  Patient recently seen by Kathyrn Drown, NP on 3/9. BP was 122/68. He was not volume overloaded, denied HF symptoms. Current medications were continued.  At last visit with PharmD BP was well controlled at home, slightly elevated in clinic that day. Most recent BMP was stable. No changes were made in medications. There was a plan to discuss SGLT2 at next visit  Home BP cuff previously found to be accurate.  Patient presents to clinic today for follow up. He denies dizziness, lightheadedness. SOB (from COPD) about the same. Pulmonologist just added new inhaler. He has some swelling during the day, but improves overnight. Weight remains stead in the mid 230's. He is trying to loose some weight. Trying to cut back on sweets. He has had bad indigestion since he has been out of his Protonix. Needs to find a new PCP.   Current CHF meds: carvedilol 6.25mg  BID, furosemide 20mg  daily, Entresto 97-103 BID, spironolactone 25mg  daily  Previously tried: chlorthalidone 12.5mg , losartan/HCTZ 100/25  BP goal: <130/80  Social History: only had one beer recently  Diet:  1 country ham biscuit, chicken and rice bowl from [employer]  Home BP readings: 109/65, 107/66, 136/80, 127/66,112/62  Wt Readings from Last 3 Encounters:  02/05/21 236 lb 3.2 oz (107.1 kg)  01/12/21 236 lb (107 kg)  12/22/20 242 lb (109.8 kg)   BP Readings from Last 3 Encounters:  02/05/21 (!) 102/50  01/12/21 138/72  12/22/20 (!) 142/70   Pulse Readings from Last 3 Encounters:  02/05/21 62  01/12/21 (!) 59   12/22/20 (!) 55    Renal function: CrCl cannot be calculated (Patient's most recent lab result is older than the maximum 21 days allowed.).  Past Medical History:  Diagnosis Date  . Arthritis   . Bifascicular block    a. 1st degree AVB/LBBB.  . Cataract   . Chronic systolic CHF (congestive heart failure) ([place])   . COPD (chronic obstructive pulmonary disease) ([place])   . ED (erectile dysfunction)   . Essential hypertension, benign    chris guess  pcp  . GERD (gastroesophageal reflux disease)   . Hypercholesterolemia   . Kidney stone    renal stone  . LBBB (left bundle branch block) 10/2007  . Migraines   . Mild dietary indigestion   . NICM (nonischemic cardiomyopathy) ([place])   . Nonischemic cardiomyopathy ([place])   . Prostate cancer ([place])   . Reflux esophagitis   . Shortness of breath dyspnea    W/ EXERTION   . Spider bite   . Thyroid cancer ([place])   . Tobacco use disorder   . Unspecified hypothyroidism     Current Outpatient Medications on File Prior to Visit  Medication Sig Dispense Refill  . ANORO ELLIPTA 62.5-25 MCG/INH AEPB INHALE 1 PUFF INTO THE LUNGS DAILY 60 each 3  . atorvastatin (LIPITOR) 40 MG tablet TAKE 1 TABLET BY  MOUTH EVERY DAY 90 tablet 3  . carvedilol (COREG) 6.25 MG tablet TAKE 1 TABLET(6.25 MG) BY MOUTH TWICE DAILY WITH A MEAL 180 tablet 3  . Fluticasone-Umeclidin-Vilant (TRELEGY ELLIPTA) 100-62.5-25 MCG/INH AEPB Inhale 1 puff into the lungs daily. 28 each 0  . furosemide (LASIX) 20 MG tablet Take 1 tablet (20 mg total) by mouth daily. 30 tablet 5  . levothyroxine (SYNTHROID) 200 MCG tablet TAKE 1 TABLET(200 MCG) BY MOUTH DAILY 90 tablet 1  . pantoprazole (PROTONIX) 40 MG tablet TAKE 1 TABLET(40 MG) BY MOUTH DAILY BEFORE BREAKFAST (Patient not taking: Reported on 02/05/2021) 90 tablet 0  . sacubitril-valsartan (ENTRESTO) 97-103 MG Take 1 tablet by mouth 2 (two) times daily. 180 tablet 3  . spironolactone (ALDACTONE) 25 MG tablet Take 1 tablet (25 mg total)  by mouth daily. 90 tablet 3   No current facility-administered medications on file prior to visit.    Allergies  Allergen Reactions  . Doxycycline Other (See Comments)    Esophagitis, severe gi bleed     Assessment/Plan:  1. CHF - Blood pressure well controlled today in clinic, at goal of <130/80. He had a few higher readings at home (prior to AM meds) but the majority were at goal. We did discuss SGLT2 to complete the 4 pillars of CHF, however, since patient is on multiple brand name meds this would become cost prohibitive. Continue carvedilol 6.25mg  BID, furosemide 20mg  daily, Entresto 97-103 BID and spironolactone 25mg  daily. We discussed limiting sugar- watching for added sugars especially in drinks and salad dressings. Advised to rinse any canned foods if possible. I have sent him in a 3 month supply of pantoprazole, but advised that he needs to find PCP or make apt with GI for refills. Follow up in clinic only as needed.  Thank you,  Ramond Dial, Pharm.D, BCPS, CPP [facility]  [address], [place], [place] [address]  Phone: [phone]; Fax: [phone]  02/08/2021 2:02 PM

## 2021-02-09 ENCOUNTER — Other Ambulatory Visit: Payer: Self-pay

## 2021-02-09 ENCOUNTER — Ambulatory Visit (INDEPENDENT_AMBULATORY_CARE_PROVIDER_SITE_OTHER): Payer: PPO | Admitting: Pharmacist

## 2021-02-09 VITALS — BP 110/60 | HR 63

## 2021-02-09 DIAGNOSIS — I5022 Chronic systolic (congestive) heart failure: Secondary | ICD-10-CM

## 2021-02-09 DIAGNOSIS — I10 Essential (primary) hypertension: Secondary | ICD-10-CM

## 2021-02-09 MED ORDER — PANTOPRAZOLE SODIUM 40 MG PO TBEC: DELAYED_RELEASE_TABLET | ORAL | 0 refills | Status: DC

## 2021-02-09 NOTE — Patient Instructions (Addendum)
It was nice seeing you!  Please continue carvedilol 6.25mg  twice a day, furosemide 20mg  daily, Entresto 97-103 twice a day and spironolactone 25mg  daily  Call me at [phone] with any questions  Try to limit your sweets- watch for added sugars in products (especially drinks and dressings) Rinse any canned items that you can

## 2021-02-22 ENCOUNTER — Telehealth: Payer: Self-pay | Admitting: Pulmonary Disease

## 2021-02-22 MED ORDER — TRELEGY ELLIPTA 100-62.5-25 MCG/INH IN AEPB: 1.0000 | INHALATION_SPRAY | Freq: Every day | RESPIRATORY_TRACT | 2 refills | Status: DC

## 2021-02-22 NOTE — Telephone Encounter (Signed)
Spoke with pt and confirmed pharmacy for Trelegy order to be placed to. Pt did want to let Dr. Elsworth Soho that he felt the like he was doing much better since he started the Trelegy. Routing to Dr. Elsworth Soho as Juluis Rainier. Order placed for inhaler, nothing further needed at this time.

## 2021-04-08 ENCOUNTER — Inpatient Hospital Stay (HOSPITAL_COMMUNITY)
Admission: EM | Admit: 2021-04-08 | Discharge: 2021-04-12 | DRG: 683 | Disposition: A | Discharge status: Home / Self Care | Payer: PPO | Attending: Student in an Organized Health Care Education/Training Program | Admitting: Student in an Organized Health Care Education/Training Program

## 2021-04-08 ENCOUNTER — Encounter (HOSPITAL_COMMUNITY): Payer: Self-pay | Admitting: Emergency Medicine

## 2021-04-08 ENCOUNTER — Observation Stay (HOSPITAL_COMMUNITY): Payer: PPO

## 2021-04-08 ENCOUNTER — Telehealth: Payer: Self-pay | Admitting: Pharmacist

## 2021-04-08 DIAGNOSIS — Z20822 Contact with and (suspected) exposure to covid-19: Secondary | ICD-10-CM | POA: Diagnosis present

## 2021-04-08 DIAGNOSIS — T679XXA Effect of heat and light, unspecified, initial encounter: Secondary | ICD-10-CM

## 2021-04-08 DIAGNOSIS — Z9049 Acquired absence of other specified parts of digestive tract: Secondary | ICD-10-CM | POA: Diagnosis not present

## 2021-04-08 DIAGNOSIS — R0609 Other forms of dyspnea: Secondary | ICD-10-CM

## 2021-04-08 DIAGNOSIS — Z881 Allergy status to other antibiotic agents status: Secondary | ICD-10-CM

## 2021-04-08 DIAGNOSIS — R260 Ataxic gait: Secondary | ICD-10-CM | POA: Diagnosis not present

## 2021-04-08 DIAGNOSIS — E785 Hyperlipidemia, unspecified: Secondary | ICD-10-CM | POA: Diagnosis present

## 2021-04-08 DIAGNOSIS — R197 Diarrhea, unspecified: Secondary | ICD-10-CM | POA: Diagnosis present

## 2021-04-08 DIAGNOSIS — I428 Other cardiomyopathies: Secondary | ICD-10-CM | POA: Diagnosis present

## 2021-04-08 DIAGNOSIS — J449 Chronic obstructive pulmonary disease, unspecified: Secondary | ICD-10-CM

## 2021-04-08 DIAGNOSIS — E860 Dehydration: Secondary | ICD-10-CM | POA: Diagnosis not present

## 2021-04-08 DIAGNOSIS — Z981 Arthrodesis status: Secondary | ICD-10-CM | POA: Diagnosis not present

## 2021-04-08 DIAGNOSIS — N200 Calculus of kidney: Secondary | ICD-10-CM | POA: Diagnosis present

## 2021-04-08 DIAGNOSIS — I509 Heart failure, unspecified: Secondary | ICD-10-CM

## 2021-04-08 DIAGNOSIS — Z87442 Personal history of urinary calculi: Secondary | ICD-10-CM

## 2021-04-08 DIAGNOSIS — I700 Atherosclerosis of aorta: Secondary | ICD-10-CM | POA: Diagnosis not present

## 2021-04-08 DIAGNOSIS — R918 Other nonspecific abnormal finding of lung field: Secondary | ICD-10-CM | POA: Diagnosis not present

## 2021-04-08 DIAGNOSIS — I452 Bifascicular block: Secondary | ICD-10-CM | POA: Diagnosis present

## 2021-04-08 DIAGNOSIS — R32 Unspecified urinary incontinence: Secondary | ICD-10-CM | POA: Diagnosis not present

## 2021-04-08 DIAGNOSIS — Z8546 Personal history of malignant neoplasm of prostate: Secondary | ICD-10-CM

## 2021-04-08 DIAGNOSIS — Z79890 Hormone replacement therapy (postmenopausal): Secondary | ICD-10-CM | POA: Diagnosis not present

## 2021-04-08 DIAGNOSIS — R911 Solitary pulmonary nodule: Secondary | ICD-10-CM | POA: Diagnosis not present

## 2021-04-08 DIAGNOSIS — N179 Acute kidney failure, unspecified: Principal | ICD-10-CM | POA: Diagnosis present

## 2021-04-08 DIAGNOSIS — J432 Centrilobular emphysema: Secondary | ICD-10-CM | POA: Diagnosis not present

## 2021-04-08 DIAGNOSIS — E875 Hyperkalemia: Secondary | ICD-10-CM | POA: Diagnosis not present

## 2021-04-08 DIAGNOSIS — F17210 Nicotine dependence, cigarettes, uncomplicated: Secondary | ICD-10-CM

## 2021-04-08 DIAGNOSIS — I10 Essential (primary) hypertension: Secondary | ICD-10-CM | POA: Diagnosis not present

## 2021-04-08 DIAGNOSIS — Z87891 Personal history of nicotine dependence: Secondary | ICD-10-CM

## 2021-04-08 DIAGNOSIS — M1990 Unspecified osteoarthritis, unspecified site: Secondary | ICD-10-CM | POA: Diagnosis present

## 2021-04-08 DIAGNOSIS — Z8249 Family history of ischemic heart disease and other diseases of the circulatory system: Secondary | ICD-10-CM

## 2021-04-08 DIAGNOSIS — I5042 Chronic combined systolic (congestive) and diastolic (congestive) heart failure: Secondary | ICD-10-CM | POA: Diagnosis present

## 2021-04-08 DIAGNOSIS — R5381 Other malaise: Secondary | ICD-10-CM | POA: Insufficient documentation

## 2021-04-08 DIAGNOSIS — I110 Hypertensive heart disease with heart failure: Secondary | ICD-10-CM | POA: Diagnosis present

## 2021-04-08 DIAGNOSIS — K219 Gastro-esophageal reflux disease without esophagitis: Secondary | ICD-10-CM | POA: Diagnosis not present

## 2021-04-08 DIAGNOSIS — E039 Hypothyroidism, unspecified: Secondary | ICD-10-CM

## 2021-04-08 DIAGNOSIS — E890 Postprocedural hypothyroidism: Secondary | ICD-10-CM | POA: Diagnosis not present

## 2021-04-08 DIAGNOSIS — D649 Anemia, unspecified: Secondary | ICD-10-CM | POA: Diagnosis present

## 2021-04-08 DIAGNOSIS — Z79899 Other long term (current) drug therapy: Secondary | ICD-10-CM

## 2021-04-08 DIAGNOSIS — Z9079 Acquired absence of other genital organ(s): Secondary | ICD-10-CM

## 2021-04-08 DIAGNOSIS — R42 Dizziness and giddiness: Secondary | ICD-10-CM | POA: Diagnosis not present

## 2021-04-08 DIAGNOSIS — D72829 Elevated white blood cell count, unspecified: Secondary | ICD-10-CM | POA: Diagnosis not present

## 2021-04-08 DIAGNOSIS — K5730 Diverticulosis of large intestine without perforation or abscess without bleeding: Secondary | ICD-10-CM | POA: Diagnosis present

## 2021-04-08 DIAGNOSIS — Z85850 Personal history of malignant neoplasm of thyroid: Secondary | ICD-10-CM

## 2021-04-08 DIAGNOSIS — I5030 Unspecified diastolic (congestive) heart failure: Secondary | ICD-10-CM | POA: Diagnosis present

## 2021-04-08 DIAGNOSIS — I447 Left bundle-branch block, unspecified: Secondary | ICD-10-CM

## 2021-04-08 DIAGNOSIS — I951 Orthostatic hypotension: Secondary | ICD-10-CM | POA: Diagnosis not present

## 2021-04-08 DIAGNOSIS — E861 Hypovolemia: Secondary | ICD-10-CM | POA: Diagnosis present

## 2021-04-08 DIAGNOSIS — Z7951 Long term (current) use of inhaled steroids: Secondary | ICD-10-CM

## 2021-04-08 DIAGNOSIS — G319 Degenerative disease of nervous system, unspecified: Secondary | ICD-10-CM | POA: Diagnosis not present

## 2021-04-08 DIAGNOSIS — J019 Acute sinusitis, unspecified: Secondary | ICD-10-CM | POA: Diagnosis not present

## 2021-04-08 LAB — BASIC METABOLIC PANEL
Anion gap: 8 (ref 5–15)
BUN: 33 mg/dL — ABNORMAL HIGH (ref 8–23)
CO2: 25 mmol/L (ref 22–32)
Calcium: 9.5 mg/dL (ref 8.9–10.3)
Chloride: 103 mmol/L (ref 98–111)
Creatinine, Ser: 2.97 mg/dL — ABNORMAL HIGH (ref 0.61–1.24)
GFR, Estimated: 21 mL/min — ABNORMAL LOW (ref >60)
Glucose, Bld: 96 mg/dL (ref 70–99)
Potassium: 6.3 mmol/L (ref 3.5–5.1)
Sodium: 136 mmol/L (ref 135–145)

## 2021-04-08 LAB — URINALYSIS, ROUTINE W REFLEX MICROSCOPIC
Glucose, UA: NEGATIVE mg/dL
Hgb urine dipstick: NEGATIVE
Ketones, ur: NEGATIVE mg/dL
Leukocytes,Ua: NEGATIVE
Nitrite: NEGATIVE
Protein, ur: 100 mg/dL — AB
Specific Gravity, Urine: >1.03 — ABNORMAL HIGH (ref 1.005–1.030)
pH: 5 (ref 5.0–8.0)

## 2021-04-08 LAB — CBC
HCT: 37.5 % — ABNORMAL LOW (ref 39.0–52.0)
HCT: 39.3 % (ref 39.0–52.0)
Hemoglobin: 12 g/dL — ABNORMAL LOW (ref 13.0–17.0)
Hemoglobin: 12.5 g/dL — ABNORMAL LOW (ref 13.0–17.0)
MCH: 30.5 pg (ref 26.0–34.0)
MCH: 30.5 pg (ref 26.0–34.0)
MCHC: 32 g/dL (ref 30.0–36.0)
MCHC: 31.8 g/dL (ref 30.0–36.0)
MCV: 95.2 fL (ref 80.0–100.0)
MCV: 95.9 fL (ref 80.0–100.0)
Platelets: 207 10*3/uL (ref 150–400)
Platelets: 181 10*3/uL (ref 150–400)
RBC: 3.94 MIL/uL — ABNORMAL LOW (ref 4.22–5.81)
RBC: 4.1 MIL/uL — ABNORMAL LOW (ref 4.22–5.81)
RDW: 12.7 % (ref 11.5–15.5)
RDW: 12.6 % (ref 11.5–15.5)
WBC: 12.5 10*3/uL — ABNORMAL HIGH (ref 4.0–10.5)
WBC: 11.9 10*3/uL — ABNORMAL HIGH (ref 4.0–10.5)
nRBC: 0 % (ref 0.0–0.2)
nRBC: 0 % (ref 0.0–0.2)

## 2021-04-08 LAB — TROPONIN I (HIGH SENSITIVITY): Troponin I (High Sensitivity): 5 ng/L (ref <18)

## 2021-04-08 LAB — URINALYSIS, MICROSCOPIC (REFLEX)

## 2021-04-08 LAB — CK: Total CK: 84 U/L (ref 49–397)

## 2021-04-08 LAB — HEPATIC FUNCTION PANEL
ALT: 26 U/L (ref 0–44)
AST: 19 U/L (ref 15–41)
Albumin: 4.1 g/dL (ref 3.5–5.0)
Alkaline Phosphatase: 87 U/L (ref 38–126)
Bilirubin, Direct: 0.1 mg/dL (ref 0.0–0.2)
Indirect Bilirubin: 1 mg/dL — ABNORMAL HIGH (ref 0.3–0.9)
Total Bilirubin: 1.1 mg/dL (ref 0.3–1.2)
Total Protein: 7.9 g/dL (ref 6.5–8.1)

## 2021-04-08 LAB — CREATININE, SERUM
Creatinine, Ser: 2.87 mg/dL — ABNORMAL HIGH (ref 0.61–1.24)
GFR, Estimated: 22 mL/min — ABNORMAL LOW (ref >60)

## 2021-04-08 LAB — CBG MONITORING, ED: Glucose-Capillary: 138 mg/dL — ABNORMAL HIGH (ref 70–99)

## 2021-04-08 LAB — LACTIC ACID, PLASMA: Lactic Acid, Venous: 1 mmol/L (ref 0.5–1.9)

## 2021-04-08 LAB — POTASSIUM: Potassium: 5.6 mmol/L — ABNORMAL HIGH (ref 3.5–5.1)

## 2021-04-08 MED ORDER — PANTOPRAZOLE SODIUM 40 MG PO TBEC
40.0000 mg | DELAYED_RELEASE_TABLET | Freq: Every day | ORAL | Status: DC
  Administered 2021-04-09 – 2021-04-12 (×4): 40 mg via ORAL
  Filled 2021-04-08 (×5): qty 1

## 2021-04-08 MED ORDER — SODIUM CHLORIDE 0.9 % IV BOLUS
1500.0000 mL | Freq: Once | INTRAVENOUS | Status: AC
  Administered 2021-04-08: 1500 mL via INTRAVENOUS

## 2021-04-08 MED ORDER — FLUTICASONE FUROATE-VILANTEROL 100-25 MCG/INH IN AEPB
1.0000 | INHALATION_SPRAY | Freq: Every day | RESPIRATORY_TRACT | Status: DC
  Administered 2021-04-10 – 2021-04-12 (×3): 1 via RESPIRATORY_TRACT
  Filled 2021-04-08: qty 28

## 2021-04-08 MED ORDER — LEVOTHYROXINE SODIUM 100 MCG PO TABS
200.0000 ug | ORAL_TABLET | Freq: Every day | ORAL | Status: DC
  Administered 2021-04-09 – 2021-04-12 (×4): 200 ug via ORAL
  Filled 2021-04-08 (×5): qty 2

## 2021-04-08 MED ORDER — HEPARIN SODIUM (PORCINE) 5000 UNIT/ML IJ SOLN
5000.0000 [IU] | Freq: Three times a day (TID) | INTRAMUSCULAR | Status: DC
  Administered 2021-04-08 – 2021-04-12 (×12): 5000 [IU] via SUBCUTANEOUS
  Filled 2021-04-08 (×12): qty 1

## 2021-04-08 MED ORDER — UMECLIDINIUM BROMIDE 62.5 MCG/INH IN AEPB
1.0000 | INHALATION_SPRAY | Freq: Every day | RESPIRATORY_TRACT | Status: DC
  Administered 2021-04-10 – 2021-04-12 (×3): 1 via RESPIRATORY_TRACT
  Filled 2021-04-08: qty 7

## 2021-04-08 MED ORDER — ATORVASTATIN CALCIUM 40 MG PO TABS
40.0000 mg | ORAL_TABLET | Freq: Every day | ORAL | Status: DC
  Administered 2021-04-09 – 2021-04-12 (×4): 40 mg via ORAL
  Filled 2021-04-08 (×5): qty 1

## 2021-04-08 NOTE — ED Triage Notes (Signed)
Patient here for evaluation of fatigue and shortness of breath over the last several days. Patient states fatigue is exacerbated by heat and exertion. Patient alert, oriented, and in no apparent distress at his time.

## 2021-04-08 NOTE — ED Notes (Signed)
Attempted to give reportx1 

## 2021-04-08 NOTE — H&P (Addendum)
Date: 04/08/2021               Patient Name:  Arthur [name]. MRN: [id number]  DOB: 02/07/1941 Age / Sex: 80 y.o., male   PCP: Arthur Agreste, MD         Medical Service: Internal Medicine Teaching Service         Attending Physician: Dr. Evette [name], Arthur [name], *    First Contact: Dr. Ileene [name] Pager: [phone]  Second Contact: Dr. Gilford [name] Pager: [phone]       After Hours (After 5p/  First Contact Pager: [phone]  weekends / holidays): Second Contact Pager: [phone]   Chief Complaint: Fatigue and weakness, low blood pressure at home  History of Present Illness: Arthur [name] is a 80 year old man with past medical history of CHF, Non-ischemic cardiomyopathy, COPD, HTN, HLD, GERD, Kidney stones w hx of lithotripsy, prostate cancer and tobacco use disorder.  He present to ED after a few days of generalized weakness, diarrhea, nausea, and fatigue.  He took his blood pressure last night and was concerned that it was 80/40 (normally 120/60) and called his PCP who told him to come in today.  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  Over the past few days he has had diarrhea after eating, about 30 minutes later.  Describes the diarrhea as loose and brown. Even though he has been drinking water lately, he noticed that he has been peeing a lot less. At baseline he is incontinent following history of prostate removal due to cancer and he usually has to change his pad 2x per day, but its been completely dry yesterday and today.  He also complains of right pain in his back. History of Kidney stones with lithotripsy 5 years ago, states that this pain in back feels similar to kidney stone pain in the past.   Denies shortness of breath, chest pain, and constipation.  He has had decreased appetite, polydipsia, and oliguria  Meds:  No current facility-administered medications on file prior to encounter.   Current Outpatient Medications on  File Prior to Encounter  Medication Sig Dispense Refill   albuterol (VENTOLIN HFA) 108 (90 Base) MCG/ACT inhaler Inhale 1-2 puffs into the lungs every 6 (six) hours as needed for wheezing or shortness of breath.     atorvastatin (LIPITOR) 40 MG tablet TAKE 1 TABLET BY MOUTH EVERY DAY (Patient taking differently: Take 40 mg by mouth daily.) 90 tablet 3   carvedilol (COREG) 6.25 MG tablet TAKE 1 TABLET(6.25 MG) BY MOUTH TWICE DAILY WITH A MEAL (Patient taking differently: Take 6.25 mg by mouth 2 (two) times daily with a meal.) 180 tablet 3   Fluticasone-Umeclidin-Vilant (TRELEGY ELLIPTA) 100-62.5-25 MCG/INH AEPB Inhale 1 puff into the lungs daily. 60 each 2   furosemide (LASIX) 20 MG tablet Take 1 tablet (20 mg total) by mouth daily. 30 tablet 5   levothyroxine (SYNTHROID) 200 MCG tablet TAKE 1 TABLET(200 MCG) BY MOUTH DAILY (Patient taking differently: Take 200 mcg by mouth daily before breakfast.) 90 tablet 1   pantoprazole (PROTONIX) 40 MG tablet TAKE 1 TABLET(40 MG) BY MOUTH DAILY BEFORE BREAKFAST (Patient taking differently: Take 40 mg by mouth daily.) 90 tablet 0   sacubitril-valsartan (ENTRESTO) 97-103 MG Take 1 tablet by mouth 2 (two) times daily. 180 tablet 3   spironolactone (ALDACTONE) 25 MG tablet Take 1 tablet (25 mg total) by mouth daily. [place]  tablet 3   ANORO ELLIPTA 62.5-25 MCG/INH AEPB INHALE 1 PUFF INTO THE LUNGS DAILY (Patient not taking: No sig reported) 60 each 3   chlorthalidone (HYGROTON) 25 MG tablet Take 12.5 mg by mouth every morning. (Patient not taking: Reported on 04/08/2021)     Fluticasone-Umeclidin-Vilant (TRELEGY ELLIPTA) 100-62.5-25 MCG/INH AEPB Inhale 1 puff into the lungs daily. (Patient not taking: No sig reported) 28 each 0     Allergies: Allergies as of 04/08/2021 - Review Complete 04/08/2021  Allergen Reaction Noted   Doxycycline Other (See Comments) 11/25/2011   Past Medical History:  Diagnosis Date   Arthritis    Bifascicular block    a. 1st degree  AVB/LBBB.   Cataract    Chronic systolic CHF (congestive heart failure) (HCC)    COPD (chronic obstructive pulmonary disease) (HCC)    ED (erectile dysfunction)    Essential hypertension, benign    Arthur [name]  pcp   GERD (gastroesophageal reflux disease)    Hypercholesterolemia    Kidney stone    renal stone   LBBB (left bundle branch block) 10/2007   Migraines    Mild dietary indigestion    NICM (nonischemic cardiomyopathy) (HCC)    Nonischemic cardiomyopathy (HCC)    Prostate cancer (HCC)    Reflux esophagitis    Shortness of breath dyspnea    W/ EXERTION    Spider bite    Thyroid cancer (HCC)    Tobacco use disorder    Unspecified hypothyroidism     Family History: Family History  Problem Relation Age of Onset   Colon cancer Brother    Heart attack Father    Skin cancer Brother    Diabetes Brother    Esophageal cancer Neg Hx    Rectal cancer Neg Hx    Stomach cancer Neg Hx    Colon polyps Neg Hx      Social History: Patient is a current smoker, states that he has smoked 2 cigarettes this year.  Also occasionally drinks beer.  He lives in [place].    Review of Systems: A complete ROS was negative except as per HPI.   Physical Exam: Blood pressure 125/67, pulse 67, temperature 98.1 F (36.7 C), temperature source Oral, resp. rate 19, SpO2 96 %. Gen: Well appearing, well nourished, in no distress Skin: Good turgor, no rash HENT: NCAT, hearing intact Eyes: conjunctiva clear, slera non-icteric Heart: Regular rhythm, no murmurs or rubs Lungs: CTAB, normal pulmonary effort Abd: No tenderness, bowel sounds normal Back: No CVA tenderness, does have tenderness to palpation on right side of lower back. Psych: oriented x3, normal mood and affect  CT renal stone: pending  EKG: Sinus rhythm with first degree A-V block, LBBB visualized in V4 and V5 as seen in previous EKG 12/2019.  Assessment & Plan by Problem: Active Problems:   AKI (acute kidney injury)  ([place])  Acute Kidney Injury On presentation, his GFR was 21, BUN of 33 with creatinine of 2.97 (baseline of 1.11).  Likely multifactorial including heat, diarrhea, entresto, spironolactone, and chlorthalidone. No nephrolithiasis or obstruction, bladder empty on CT. No muddy brown casts or RBC on UA to suggest ATN.  No hypotension since admission but blood pressures are on the soft side Hyperkalemia. 6.3 on admission. Down to 5.6 on repeat.  Nephrolithiasis. 19mm non-obstructing stone in the left kidney.  Plan -fluid repletion. Receiving 1500cc NS. Monitor volume status closely in the setting of known CHF -hold home medications for tonight. Provided education on importance of staying  hydrated in relation to medications -check FeNa -tele monitoring -Strict I/O  Diarrhea. Likely contributed to dehydration. No gastrointestinal inflammatory changes appreciated on CT. Leukocytosis is likely reactive to dehydration vs hemoconcentration. He denies abdominal pain or other related symptoms. Not on any medications that should cause diarrhea.  -will monitor for recurrence while here and could consider further workup at that time -check TSH  Leukocytosis WBC of 12.5, no fever or chills recently.  His UA showed few bacteria, -ve for leukocytes, but he did have proteinuria.  UA also showed small bilirubin, hepatic functio panel -ve. -Follow up on CBC  Normocytic Anemia Hgb of 12.0 on presentation.  On chart review, his baseline is between 12.4-13.7 over last few years.  Patient stated that recent episodes of diarrhea were not dark, so not concerning for GI bleed.  -ordered ferritin study to evaluate for iron deficiency anemia. -Repeating CBC 7/8, will follow.  #Chronic LBBB. Previous notes indicate that no pacemaker was placed as he was asymptomatic. He continues to follow with cardiology.   #Chronic HFpEF, NCIM. Follows with cardiology. Euvolemic on exam. On GDMT with entresto, coreg,  spirolonolactone. Not on SGLT2.  #Hx of Hypertension. Normotensive on admission -hold home medications in setting of AKI -monitor volume status closely with fluid repletion  #Hyperlipidemia. Continue statin.   #Chronic hypothyroidism--continue synthroid. Check TSH  #COPD--Chronic and stable. no evidence of exacerbation. Lungs are clear on admission. On trelegy at home. Follows with Dr. Elsworth Soho -spiriva and breo   #Chronic lung nodules. Followed by pulmonology. Stable on serial Cts.   #Tobacco use disorder--encourage complete cessation  #GERD. Chronic and stable. Continue protonix  #[name] s/p TURP 2015. Complicated by urinary incontinence.  #History of thyroid cancer s/p thyroidectomy. reported in the chart. Unable to find further information on this.   #PCP needs. Previously followed at [facility] however pt reports not having seen them since the beginning of COVID a couple of years ago. He would like to establish with a provider closer to [place].  -TOC consulted for assistance  Diet: Regular DVT: SubQ heparin due to GFR=33 IVF: NS Code: FULL   Dispo: Admit patient to Observation with expected length of stay less than 2 midnights.  Signed: Christiana Fuchs, DO 04/08/2021, 8:48 PM  Pager: [phone] After 5pm on weekdays and 1pm on weekends: On Call pager: [phone]

## 2021-04-08 NOTE — ED Provider Notes (Addendum)
Emergency Medicine Provider Triage Evaluation Note  Arthur [name]. , a 80 y.o. male  was evaluated in triage.  Pt complains of fatigue and generalized weakness.  Symptoms have been going on for the past few days.  Reports paresthesias in bilateral arms and legs.  Denies any chest pain, abdominal pain or vomiting.  Review of Systems  Positive: Generalized weakness Negative: Chest pain, vomiting, shortness of breath  Physical Exam  BP 102/74   Pulse 71   Temp 98.1 F (36.7 C) (Oral)   Resp (!) 24   SpO2 97%  Gen:   Awake, no distress   Resp:  Normal effort  MSK:   Moves extremities without difficulty  Other:  No facial asymmetry  Medical Decision Making  Medically screening exam initiated at 3:20 PM.  Appropriate orders placed.  Arthur [name]. was informed that the remainder of the evaluation will be completed by another provider, this initial triage assessment does not replace that evaluation, and the importance of remaining in the ED until their evaluation is complete.  Lab work and Copy ordered  5:01 PM Lab work significant for hyperkalemia of 6.3.  We will recheck this due to hemolysis but he does in fact have a new AKI as well so we will consider this accurate, advised nurse that patient needs to be roomed next.    Delia Heady, PA-C 04/08/21 1702    Daleen Bo, MD 04/10/21 [phone]
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
## 2021-04-08 NOTE — Telephone Encounter (Signed)
Patient called stating his BP is very low- 62'I systolic. He is feeling weak, nauseous, diarrhea. He has been out in the heat. I advised that he probably is dehyrated and over heated. He needs to be seen in ED for fluids. Asked if he had anyone that could take him to the ED. He said No. Advised he should call EMS to take him. He states he doesn't want to do that. He would drive himself. I advised again that he really should call EMS. He states he is ok to drive himself. He says [facility] is closer than [place] to him. He will go to [facility] ER.

## 2021-04-08 NOTE — Hospital Course (Addendum)
80 yo male presenting with AKI and hyperkalemia.   Prerenal AKI with hyperkalemia -replete fluids   Says that he is putting out a good amount of urine, denies any pink tinge to the urine, feels like it is about as clear as it was yesterday. Says that he is a little bit congested with a cough this morning, thinks it is due to the [facility] in here. He is "not used to this life of luxury". He says that he gets a cough occasionally at baseline. He does not really have a primary care provider that he follows with anymore but is willing to follow with [facility]. Has an appointment with his cardiologist in mid August. Denies consistent use of NSAIDs maybe an alieve once a month but does not remember taking one recently. Agreed not to take more until he follows up with his doctor.

## 2021-04-09 DIAGNOSIS — N179 Acute kidney failure, unspecified: Secondary | ICD-10-CM | POA: Diagnosis present

## 2021-04-09 DIAGNOSIS — I951 Orthostatic hypotension: Secondary | ICD-10-CM | POA: Diagnosis not present

## 2021-04-09 DIAGNOSIS — I110 Hypertensive heart disease with heart failure: Secondary | ICD-10-CM | POA: Diagnosis present

## 2021-04-09 DIAGNOSIS — K219 Gastro-esophageal reflux disease without esophagitis: Secondary | ICD-10-CM | POA: Diagnosis present

## 2021-04-09 DIAGNOSIS — R32 Unspecified urinary incontinence: Secondary | ICD-10-CM | POA: Diagnosis present

## 2021-04-09 DIAGNOSIS — D649 Anemia, unspecified: Secondary | ICD-10-CM | POA: Diagnosis present

## 2021-04-09 DIAGNOSIS — E785 Hyperlipidemia, unspecified: Secondary | ICD-10-CM | POA: Diagnosis present

## 2021-04-09 DIAGNOSIS — Z20822 Contact with and (suspected) exposure to covid-19: Secondary | ICD-10-CM | POA: Diagnosis present

## 2021-04-09 DIAGNOSIS — I428 Other cardiomyopathies: Secondary | ICD-10-CM | POA: Diagnosis present

## 2021-04-09 DIAGNOSIS — E861 Hypovolemia: Secondary | ICD-10-CM | POA: Diagnosis present

## 2021-04-09 DIAGNOSIS — E860 Dehydration: Secondary | ICD-10-CM | POA: Diagnosis not present

## 2021-04-09 DIAGNOSIS — I452 Bifascicular block: Secondary | ICD-10-CM | POA: Diagnosis present

## 2021-04-09 DIAGNOSIS — N200 Calculus of kidney: Secondary | ICD-10-CM | POA: Diagnosis present

## 2021-04-09 DIAGNOSIS — Z79890 Hormone replacement therapy (postmenopausal): Secondary | ICD-10-CM | POA: Diagnosis not present

## 2021-04-09 DIAGNOSIS — I700 Atherosclerosis of aorta: Secondary | ICD-10-CM | POA: Diagnosis present

## 2021-04-09 DIAGNOSIS — Z981 Arthrodesis status: Secondary | ICD-10-CM | POA: Diagnosis not present

## 2021-04-09 DIAGNOSIS — M1990 Unspecified osteoarthritis, unspecified site: Secondary | ICD-10-CM | POA: Diagnosis present

## 2021-04-09 DIAGNOSIS — I5042 Chronic combined systolic (congestive) and diastolic (congestive) heart failure: Secondary | ICD-10-CM | POA: Diagnosis present

## 2021-04-09 DIAGNOSIS — I509 Heart failure, unspecified: Secondary | ICD-10-CM | POA: Diagnosis not present

## 2021-04-09 DIAGNOSIS — R197 Diarrhea, unspecified: Secondary | ICD-10-CM | POA: Diagnosis present

## 2021-04-09 DIAGNOSIS — K5730 Diverticulosis of large intestine without perforation or abscess without bleeding: Secondary | ICD-10-CM | POA: Diagnosis present

## 2021-04-09 DIAGNOSIS — E890 Postprocedural hypothyroidism: Secondary | ICD-10-CM | POA: Diagnosis present

## 2021-04-09 DIAGNOSIS — J432 Centrilobular emphysema: Secondary | ICD-10-CM | POA: Diagnosis present

## 2021-04-09 DIAGNOSIS — E875 Hyperkalemia: Secondary | ICD-10-CM | POA: Diagnosis present

## 2021-04-09 DIAGNOSIS — R918 Other nonspecific abnormal finding of lung field: Secondary | ICD-10-CM | POA: Diagnosis present

## 2021-04-09 LAB — BASIC METABOLIC PANEL
Anion gap: 7 (ref 5–15)
Anion gap: 7 (ref 5–15)
BUN: 37 mg/dL — ABNORMAL HIGH (ref 8–23)
BUN: 31 mg/dL — ABNORMAL HIGH (ref 8–23)
CO2: 23 mmol/L (ref 22–32)
CO2: 23 mmol/L (ref 22–32)
Calcium: 8.6 mg/dL — ABNORMAL LOW (ref 8.9–10.3)
Calcium: 8.8 mg/dL — ABNORMAL LOW (ref 8.9–10.3)
Chloride: 105 mmol/L (ref 98–111)
Chloride: 106 mmol/L (ref 98–111)
Creatinine, Ser: 2.27 mg/dL — ABNORMAL HIGH (ref 0.61–1.24)
Creatinine, Ser: 1.91 mg/dL — ABNORMAL HIGH (ref 0.61–1.24)
GFR, Estimated: 29 mL/min — ABNORMAL LOW (ref >60)
GFR, Estimated: 35 mL/min — ABNORMAL LOW (ref >60)
Glucose, Bld: 146 mg/dL — ABNORMAL HIGH (ref 70–99)
Glucose, Bld: 161 mg/dL — ABNORMAL HIGH (ref 70–99)
Potassium: 4.8 mmol/L (ref 3.5–5.1)
Potassium: 4.9 mmol/L (ref 3.5–5.1)
Sodium: 135 mmol/L (ref 135–145)
Sodium: 136 mmol/L (ref 135–145)

## 2021-04-09 LAB — CBC
HCT: 30.6 % — ABNORMAL LOW (ref 39.0–52.0)
Hemoglobin: 9.9 g/dL — ABNORMAL LOW (ref 13.0–17.0)
MCH: 30.4 pg (ref 26.0–34.0)
MCHC: 32.4 g/dL (ref 30.0–36.0)
MCV: 93.9 fL (ref 80.0–100.0)
Platelets: 142 10*3/uL — ABNORMAL LOW (ref 150–400)
RBC: 3.26 MIL/uL — ABNORMAL LOW (ref 4.22–5.81)
RDW: 12.5 % (ref 11.5–15.5)
WBC: 9.5 10*3/uL (ref 4.0–10.5)
nRBC: 0 % (ref 0.0–0.2)

## 2021-04-09 LAB — FERRITIN: Ferritin: 91 ng/mL (ref 24–336)

## 2021-04-09 LAB — RESP PANEL BY RT-PCR (FLU A&B, COVID) ARPGX2
Influenza A by PCR: NEGATIVE
Influenza B by PCR: NEGATIVE
SARS Coronavirus 2 by RT PCR: NEGATIVE

## 2021-04-09 LAB — CREATININE, URINE, RANDOM: Creatinine, Urine: 163.96 mg/dL

## 2021-04-09 LAB — SODIUM, URINE, RANDOM: Sodium, Ur: 103 mmol/L

## 2021-04-09 LAB — TSH: TSH: 0.84 u[IU]/mL (ref 0.350–4.500)

## 2021-04-09 MED ORDER — LACTATED RINGERS IV SOLN: INTRAVENOUS | Status: DC

## 2021-04-09 MED ORDER — LACTATED RINGERS IV SOLN: INTRAVENOUS | Status: AC

## 2021-04-09 MED ORDER — SODIUM ZIRCONIUM CYCLOSILICATE 5 G PO PACK
5.0000 g | PACK | Freq: Two times a day (BID) | ORAL | Status: AC
  Administered 2021-04-09 (×2): 5 g via ORAL
  Filled 2021-04-09 (×2): qty 1

## 2021-04-09 MED ORDER — LACTATED RINGERS IV BOLUS
1000.0000 mL | Freq: Once | INTRAVENOUS | Status: AC
  Administered 2021-04-09: 1000 mL via INTRAVENOUS

## 2021-04-09 NOTE — Plan of Care (Signed)

## 2021-04-09 NOTE — TOC Initial Note (Signed)
Transition of Care (TOC) - Initial/Assessment Note    Patient Details  Name: Arthur [name]. MRN: [id number] Date of Birth: December 20, 1940  Transition of Care [facility]) CM/SW Contact:    Verdell Carmine, RN Phone Number: 04/09/2021, 9:14 AM  Clinical Narrative:                  Patient under observation for fatigue, weakness and low blood pressure at home. History of incontinence due to prostate removal , and CHF. Has family  support, lives alone.  May need Home Health PT post hospitalization.  Wants a PCP closer to home, suggested [place] [facility], on patient instructions. CM will follow for needs  Expected Discharge Plan: [place] Barriers to Discharge: Continued Medical Work up   Patient Goals and CMS Choice        Expected Discharge Plan and Services Expected Discharge Plan: [place]       Living arrangements for the past 2 months: Single Family Home                                      Prior Living Arrangements/Services Living arrangements for the past 2 months: Single Family Home Lives with:: Self Patient language and need for interpreter reviewed:: Yes        Need for Family Participation in Patient Care: Yes (Comment) Care giver support system in place?: Yes (comment)   Criminal Activity/Legal Involvement Pertinent to Current Situation/Hospitalization: No - Comment as needed  Activities of Daily Living      Permission Sought/Granted                  Emotional Assessment       Orientation: : Oriented to Self, Oriented to Place, Oriented to  Time, Oriented to Situation Alcohol / Substance Use: Not Applicable Psych Involvement: No (comment)  Admission diagnosis:  Hyperkalemia [E87.5] AKI (acute kidney injury) ([place]) [N17.9] Heat exposure, initial encounter [T67.9XXA] Acute renal failure, unspecified acute renal failure type [facility]) [N17.9] Patient Active Problem List   Diagnosis Date Noted   AKI  (acute kidney injury) ([place]) 04/08/2021   Diarrhea 04/08/2021   (HFpEF) heart failure with preserved ejection fraction ([place]) 04/08/2021   Hyperkalemia 23/34/3568   Chronic systolic heart failure ([place]) 02/18/2020   Aortic atherosclerosis ([place]) 01/23/2020   Esophageal dysphagia 04/04/2018   Dizziness 02/27/2018   Calculus of gallbladder with acute cholecystitis and obstruction    Epigastric pain 03/30/2017   COPD (chronic obstructive pulmonary disease) ([place]) 01/12/2017   Chest pain    Centrilobular emphysema ([place]) 10/13/2014   Left ureteral stone 09/24/2014   Renal calculus, left 09/24/2014   Ureteral stricture, left 09/24/2014   Reflux esophagitis    Osteoarthritis of left knee 08/03/2012   Nicotine addiction 05/10/2012   Personal history of colonic polyps 01/26/2012   GERD (gastroesophageal reflux disease)    Migraines    Cancer [facility])    ED (erectile dysfunction)    HYPOTHYROIDISM 12/30/2008   HYPERCHOLESTEROLEMIA 12/30/2008   Essential hypertension 12/30/2008   Non-ischemic cardiomyopathy ([place]) 12/30/2008   LBBB (left bundle branch block) 12/30/2008   Multiple lung nodules on CT 12/30/2008   ARTHRITIS 12/30/2008   PCP:  Wendie Agreste, MD Pharmacy:   [id number], [address] [place] [address] Phone: [phone] Fax: [phone]  [id number] - [place], [address] [place] [address] Phone: [phone] Fax: [phone]     Social Determinants of Health ([place]) Interventions    Readmission Risk Interventions No flowsheet data found.

## 2021-04-09 NOTE — Progress Notes (Signed)
New Admission Note:   Arrival Method: via wheelchair from ED Mental Orientation: alert and oriented x4 Telemetry: 5M06, CCMD notified Assessment: to be completed Skin: Intact, warm and dry IV: LAC, nsl Pain: 0/10 Tubes: None Safety Measures: Safety Fall Prevention Plan has been discussed  Admission: to be completed 5 [place] Orientation: Patient has been oriented to the room, unit and staff.   Family: none at bedside  Orders to be reviewed and implemented. Will continue to monitor the patient. Call light has been placed within reach and bed alarm has been activated.

## 2021-04-09 NOTE — Progress Notes (Addendum)
HD#0 Subjective:  Overnight Events: no acute events overnight.  Patient reports that he is doing well this morning. Denies any fevers, no nausea or vomiting. Still complaining of pain in his groin. Pain controlled. Complains of numbness and tingling in his hand bilaterally. Does endorse some loose stools 3-4 daily. Baseline 1-2. No cough. No other complaints at this time.   Objective:  Vital signs in last 24 hours: Vitals:   04/09/21 0046 04/09/21 0523 04/09/21 0822 04/09/21 1228  BP: 114/64 97/62 (!) 108/58 107/74  Pulse: 74 63 65 63  Resp: 18 17 18 18   Temp: 98.3 F (36.8 C) 98.4 F (36.9 C) 98 F (36.7 C) 98.1 F (36.7 C)  TempSrc: Oral Oral Oral Oral  SpO2: 97% 94% 97% 98%  Weight: 105.8 kg      Supplemental O2: Room Air SpO2: 98 %   Physical Exam:  Constitutional: well-appearing, sitting in bed, in no acute distress HENT: normocephalic atraumatic, mucous membranes moist Eyes: conjunctiva non-erythematous Cardiovascular: regular rate and rhythm, no m/r/g Pulmonary/Chest: normal work of breathing on room air, lungs clear to auscultation bilaterally Abdominal: soft, mild tenderness RLQ, non-distended MSK: normal bulk and tone Neurological: alert & oriented x 3, 5/5 strength in bilateral upper and lower extremities, Skin: warm and dry Psych: appropriate mood and affect  Filed Weights   04/09/21 0046  Weight: 105.8 kg     Intake/Output Summary (Last 24 hours) at 04/09/2021 1406 Last data filed at 04/09/2021 1300 Gross per 24 hour  Intake 3620.01 ml  Output 930 ml  Net 2690.01 ml   Net IO Since Admission: 2,690.01 mL [04/09/21 1406]  Pertinent Labs: CBC Latest Ref Rng & Units 04/09/2021 04/08/2021 04/08/2021  WBC 4.0 - 10.5 K/uL 9.5 11.9(H) 12.5(H)  Hemoglobin 13.0 - 17.0 g/dL 9.9(L) 12.5(L) 12.0(L)  Hematocrit 39.0 - 52.0 % 30.6(L) 39.3 37.5(L)  Platelets 150 - 400 K/uL 142(L) 181 207    CMP Latest Ref Rng & Units 04/09/2021 04/08/2021 04/08/2021  Glucose 70 - 99  mg/dL 146(H) - -  BUN 8 - 23 mg/dL 37(H) - -  Creatinine 0.61 - 1.24 mg/dL 2.27(H) 2.87(H) -  Sodium 135 - 145 mmol/L 135 - -  Potassium 3.5 - 5.1 mmol/L 4.8 - 5.6(H)  Chloride 98 - 111 mmol/L 105 - -  CO2 22 - 32 mmol/L 23 - -  Calcium 8.9 - 10.3 mg/dL 8.6(L) - -  Total Protein 6.5 - 8.1 g/dL - - 7.9  Total Bilirubin 0.3 - 1.2 mg/dL - - 1.1  Alkaline Phos 38 - 126 U/L - - 87  AST 15 - 41 U/L - - 19  ALT 0 - 44 U/L - - 26    Assessment/Plan:   Principal Problem:   AKI (acute kidney injury) ([place]) Active Problems:   Essential hypertension   (HFpEF) heart failure with preserved ejection fraction (HCC)   Hyperkalemia   Patient Summary: Arthur Aguinaldo. is a 80 y.o. with a pertinent PMH of nonischemic heart failure with preserved EF and HTN, who presented with increasing fatigue, generalized weakness and admitted for decreased blood pressure recorded at home 80/40 and was advised to come to hospital by PCP. He has a history of prostate cancer and prostatectomy that was complicated by urinary incontinence. He has had good compliance with his medications.    #Acute kidney injury # Hyperkalemia Cr up to 2.9 from normal, BUN 33.  CT abd showed a totally decompressed bladder with no evidence of hydronephrosis or  obstructing nephrolithiasis. AKI likely due to dehydration exacerbated by heart failure medications Entresto, spironolactone, Lasix, chlorthalidone. Renal function is improving with IV fluids. No RBCs noted in UA Hyperkalemia at 6.3 with peaked T waves Vs and V3 repeat improving. Bladder scans have been normal. - continue to hold entresto, spiro, lasix, and chlorthalidone - Received about 3 L of IV fluid since admission. Will stop for now, continue oral intake. He is at risk for volume overload due to HFpEF but looks euvolemic today. Arthur [name] 5mg  twice today, then stop  Chronic HFpEF:  euvolemic on exam today. Likely will need to hold some of his GDMT at discharge. - mild  dyspnea on exertion but this is a chronic. - can continue carvedilol 6.25 BID  Normocytic anemia Baseline 12.4-13.7. No obvious signs of bleeding in urine or stool  - will follow up ferritin - repeat CBC  COPD Lung nodules Follows with Dr. Elsworth Soho. Stable on serial CT. On trelegy for COPD. Lung clear on admission. Continue to monitor.  HLD - continue statin.  Hypothyroid - synthroid  Tobacco use - cessation recommended  GERD - continue protonix  Diet: Normal IVF: LR,None VTE: Heparin Code: Full   Dispo: Anticipated discharge to  pending  in 1-2 days pending clinical improvement.   Delene Ruffini, MD Internal Medicine Resident PGY-1 Pager [phone] Please contact the on call pager after 5 pm and on weekends at [phone].

## 2021-04-09 NOTE — Plan of Care (Signed)

## 2021-04-10 DIAGNOSIS — I951 Orthostatic hypotension: Secondary | ICD-10-CM

## 2021-04-10 LAB — BASIC METABOLIC PANEL
Anion gap: 4 — ABNORMAL LOW (ref 5–15)
BUN: 27 mg/dL — ABNORMAL HIGH (ref 8–23)
CO2: 26 mmol/L (ref 22–32)
Calcium: 8.8 mg/dL — ABNORMAL LOW (ref 8.9–10.3)
Chloride: 105 mmol/L (ref 98–111)
Creatinine, Ser: 1.54 mg/dL — ABNORMAL HIGH (ref 0.61–1.24)
GFR, Estimated: 46 mL/min — ABNORMAL LOW (ref >60)
Glucose, Bld: 126 mg/dL — ABNORMAL HIGH (ref 70–99)
Potassium: 4.8 mmol/L (ref 3.5–5.1)
Sodium: 135 mmol/L (ref 135–145)

## 2021-04-10 LAB — CBC
HCT: 31.7 % — ABNORMAL LOW (ref 39.0–52.0)
Hemoglobin: 10.7 g/dL — ABNORMAL LOW (ref 13.0–17.0)
MCH: 31.1 pg (ref 26.0–34.0)
MCHC: 33.8 g/dL (ref 30.0–36.0)
MCV: 92.2 fL (ref 80.0–100.0)
Platelets: 174 10*3/uL (ref 150–400)
RBC: 3.44 MIL/uL — ABNORMAL LOW (ref 4.22–5.81)
RDW: 12.2 % (ref 11.5–15.5)
WBC: 9 10*3/uL (ref 4.0–10.5)
nRBC: 0 % (ref 0.0–0.2)

## 2021-04-10 MED ORDER — LACTATED RINGERS IV SOLN: INTRAVENOUS | Status: AC

## 2021-04-10 NOTE — Evaluation (Signed)
Occupational Therapy Evaluation  Patient Details Name: Arthur [name]. 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 PMH chronic nonischemic heart failure with preserved ejection fraction, hypertension, prostate Cancer, urinary incontinence, COPD   Clinical Impression   This 80 yo male admitted with above presents to acute OT with PLOF of being totally independent with all basic ADLs and very active (still working    Follow Up Recommendations  No OT follow up (24 hour S/prn A initially)    Equipment Recommendations  None recommended by OT           Precautions / Restrictions Precautions Precautions: Fall Precaution Comments: decreased gaze stability when went from sit>supine with c/o dizziness Restrictions Weight Bearing Restrictions: No      Mobility Bed Mobility Overal bed mobility: Independent             General bed mobility comments: Upon laying back in bed pt said he felt dizzy and was unable to fixate gaze    Transfers Overall transfer level: Needs assistance Equipment used: None Transfers: Sit to/from Stand Sit to Stand: Min guard         General transfer comment: pt did sway upon standing    Balance Overall balance assessment: Needs assistance Sitting-balance support: No upper extremity supported;Feet supported Sitting balance-Leahy Scale: Good     Standing balance support: Single extremity supported Standing balance-Leahy Scale: Poor Standing balance comment: posterior sway and "catches"                           ADL either performed or assessed with clinical judgement   ADL Overall ADL's : Needs assistance/impaired Eating/Feeding: Independent;Sitting   Grooming: Min guard;Standing   Upper Body Bathing: Set up;Sitting   Lower Body Bathing: Min guard;Sit  to/from stand   Upper Body Dressing : Set up;Sitting   Lower Body Dressing: Min guard;Sit to/from stand   Toilet Transfer: Minimal assistance;Ambulation Toilet Transfer Details (indicate cue type and reason): No AD Toileting- Clothing Manipulation and Hygiene: Min guard;Sit to/from stand               Vision Patient Visual Report: No change from baseline              Pertinent Vitals/Pain Pain Assessment: No/denies pain     Hand Dominance Right   Extremity/Trunk Assessment Upper Extremity Assessment Upper Extremity Assessment: Overall WFL for tasks assessed   Lower Extremity Assessment Lower Extremity Assessment: Overall WFL for tasks assessed   Cervical / Trunk Assessment Cervical / Trunk Assessment: Normal   Communication Communication Communication: HOH   Cognition Arousal/Alertness: Awake/alert Behavior During Therapy: WFL for tasks assessed/performed Overall Cognitive Status: Within Functional Limits for tasks assessed                                     General Comments  see flowsheet for partial orthostatic BPs (could not stand for 3 minute mark due to dizziness)            Home Living Family/patient expects to be discharged to:: Private residence Living Arrangements: Alone Available Help at Discharge: Friend(s) Type of Home: House Home Access: Stairs to enter [employer] of Steps: 3 Entrance Stairs-Rails: Right;Left Home Layout: One level  Bathroom Shower/Tub: Occupational psychologist: Standard     Home Equipment: Environmental consultant - 2 wheels;Cane - single point;Bedside commode;Shower seat - built in          Prior Functioning/Environment Level of Independence: Independent        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; driving heavy equipment        OT Problem List: Decreased activity tolerance;Impaired balance (sitting and/or standing)      OT Treatment/Interventions: Self-care/ADL training;DME and/or  AE instruction;Patient/family education;Balance training    OT Goals(Current goals can be found in the care plan section) Acute Rehab OT Goals Patient Stated Goal: to feel balanced when I am up on my feet, get back to dancing OT Goal Formulation: With patient Time For Goal Achievement: 04/24/21 Potential to Achieve Goals: Good  OT Frequency: Min 2X/week    AM-PAC OT "6 Clicks" Daily Activity     Outcome Measure Help from another person eating meals?: None Help from another person taking care of personal grooming?: A Little Help from another person toileting, which includes using toliet, bedpan, or urinal?: A Little Help from another person bathing (including washing, rinsing, drying)?: A Little Help from another person to put on and taking off regular upper body clothing?: A Little Help from another person to put on and taking off regular lower body clothing?: A Little 6 Click Score: 19   End of Session    Activity Tolerance:  (pt limited by decreased balance) Patient left: in bed;with call bell/phone within reach;with bed alarm set  OT Visit Diagnosis: Unsteadiness on feet (R26.81);Other abnormalities of gait and mobility (R26.89);Dizziness and giddiness (R42)                Time: 2035-5974 OT Time Calculation (min): 33 min Charges:  OT General Charges $OT Visit: 1 Visit OT Evaluation $OT Eval Moderate Complexity: 1 Mod OT Treatments $Self Care/Home Management : 8-22 mins  [place], OTR/L Acute [employer] Pager [phone] Office [phone]    Almon Register 04/10/2021, 4:42 PM

## 2021-04-10 NOTE — Progress Notes (Signed)
Physical Therapy Evaluation Patient Details Name: Arthur [name]. 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 PMH chronic nonischemic heart failure with preserved ejection fraction, hypertension, prostate Cancer, urinary incontinence, COPD  Clinical Impression   Pt admitted secondary to problem above with deficits below. 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  Pt currently requires min assist to prevent falling due to repeated imbalance and dizziness. BP dropped (see flowsheet) from sit to stand and was unable to stand for 3 minutes to get final bP due to dizziness. Will plan to reassess 7/10 for potential discharge home.  Advised pt to try to line up someone to stay with him or he can stay with on discharge. Anticipate patient will benefit from PT to address problems listed below.Will continue to follow acutely to maximize functional mobility independence and safety.       Follow Up Recommendations Supervision/Assistance - 24 hour (currently needs; may not need by time of discharge)    Equipment Recommendations  None recommended by PT    Recommendations for Other Services OT consult     Precautions / Restrictions Precautions Precautions: Fall Restrictions Weight Bearing Restrictions: No      Mobility  Bed Mobility Overal bed mobility: Independent                  Transfers Overall transfer level: Needs assistance Equipment used: 1 person hand held assist Transfers: Sit to/from Stand Sit to Stand: Min assist         General transfer comment: imbalance posteriorly  Ambulation/Gait Ambulation/Gait assistance: Min assist Gait Distance (Feet): 35 Feet Assistive device: None Gait Pattern/deviations: Step-through pattern;Decreased stride length      General Gait Details: pt with knees buckling x 1 with min assist to recover; imbalance posteriorly x 2 with assist to recover; appears very dizzy/drunk when walking; denies vertigo, +dizziness  Stairs            Wheelchair Mobility    Modified Rankin (Stroke Patients Only)       Balance Overall balance assessment: Needs assistance Sitting-balance support: No upper extremity supported;Feet supported Sitting balance-Leahy Scale: Good     Standing balance support: Single extremity supported Standing balance-Leahy Scale: Poor Standing balance comment: posterior sway and "catches"                             Pertinent Vitals/Pain Pain Assessment: No/denies pain    Home Living Family/patient expects to be discharged to:: Private residence Living Arrangements: Alone Available Help at Discharge: Friend(s) Type of Home: House Home Access: Stairs to enter Entrance Stairs-Rails: Psychiatric nurse of Steps: 3 Home Layout: One level Home Equipment: Environmental consultant - 2 wheels;Cane - single point;Bedside commode;Shower seat - built in      Prior Function Level of Independence: Independent         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; driving heavy Engineer, building services Dominance   Dominant Hand: Right    Extremity/Trunk Assessment   Upper Extremity Assessment Upper Extremity Assessment: Overall WFL for tasks assessed    Lower Extremity Assessment Lower Extremity Assessment: Overall WFL for tasks assessed    Cervical / Trunk Assessment Cervical / Trunk Assessment: Normal  Communication   Communication: HOH  Cognition Arousal/Alertness: Awake/alert Behavior During Therapy: [facility]  for tasks assessed/performed Overall Cognitive Status: Within Functional Limits for tasks assessed                                        General Comments General comments (skin integrity, edema, etc.): see flowsheet for partial orthostatic BPs (could not  stand for 3 minute mark due to dizziness)    Exercises     Assessment/Plan    PT Assessment Patient needs continued PT services  PT Problem List Decreased activity tolerance;Decreased balance;Decreased mobility;Decreased knowledge of use of DME;Cardiopulmonary status limiting activity       PT Treatment Interventions DME instruction;Gait training;Stair training;Functional mobility training;Therapeutic activities;Therapeutic exercise;Balance training;Patient/family education    PT Goals (Current goals can be found in the Care Plan section)  Acute Rehab PT Goals Patient Stated Goal: feel safe enough to go home PT Goal Formulation: With patient Time For Goal Achievement: 04/24/21 Potential to Achieve Goals: Good    Frequency Min 3X/week   Barriers to discharge Decreased caregiver support may be able to get a friend to stay with him    Co-evaluation               AM-PAC PT "6 Clicks" Mobility  Outcome Measure Help needed turning from your back to your side while in a flat bed without using bedrails?: None Help needed moving from lying on your back to sitting on the side of a flat bed without using bedrails?: None Help needed moving to and from a bed to a chair (including a wheelchair)?: A Little Help needed standing up from a chair using your arms (e.g., wheelchair or bedside chair)?: A Little Help needed to walk in hospital room?: A Little Help needed climbing 3-5 steps with a railing? : A Little 6 Click Score: 20    End of Session Equipment Utilized During Treatment: Gait belt Activity Tolerance: Treatment limited secondary to medical complications (Comment) (imbalance and dizziness) Patient left: in chair;with call bell/phone within reach Nurse Communication: Mobility status PT Visit Diagnosis: Unsteadiness on feet (R26.81)    Time: 6063-0160 PT Time Calculation (min) (ACUTE ONLY): 26 min   Charges:   PT Evaluation $PT Eval Low Complexity: 1 Low PT  Treatments $Therapeutic Activity: 8-22 mins         Arby Barrette, PT Pager [phone]   Rexanne Mano 04/10/2021, 1:27 PM

## 2021-04-10 NOTE — Discharge Instructions (Addendum)
Arthur [name],  It was a pleasure taking care of you during your stay here at [facility]  During your stay you were admitted for an acute kidney injury likely from being dehydrated.  Your potassium was also noted to be high. During your stay you were given intravenous fluids and medications, with return of your kidney function and a normal potassium level.  After discharge, please stop taking Lasix, spironolactone, and chlorthalidone.  Please continue taking your Entresto on April 12, 2021.  We will have you follow-up in our internal medicine clinic here at [facility] later this week.  Please reach out to your cardiologist to schedule appointment in the next 1 to 2 weeks.

## 2021-04-10 NOTE — Plan of Care (Signed)
  Problem: Activity: Goal: Risk for activity intolerance will decrease Outcome: Progressing   Problem: Nutrition: Goal: Adequate nutrition will be maintained Outcome: Progressing   Problem: Safety: Goal: Ability to remain free from injury will improve Outcome: Progressing   

## 2021-04-10 NOTE — Progress Notes (Addendum)
HD#1 Subjective:  Overnight Events: No acute events overnight.  Patient seen at bedside this morning.  Patient says that he feels well, he is continuing to produce urine which has been yellow in color.  He denies feelings of dizziness, nausea, vomiting.  Objective:  Vital signs in last 24 hours: Vitals:   04/09/21 2019 04/10/21 0414 04/10/21 0846 04/10/21 0920  BP: 120/75 113/67  115/70  Pulse: 70 64  64  Resp: 18 18  18   Temp: 97.9 F (36.6 C) 97.7 F (36.5 C)  97.7 F (36.5 C)  TempSrc: Oral Oral  Oral  SpO2: 98% 97% 98% 98%  Weight:       Supplemental O2: Room Air SpO2: 98 %   Physical Exam:  Physical Exam Constitutional:      General: He is not in acute distress.    Appearance: He is not ill-appearing.     Comments: Awake and alert, answers questions appropriately, no cute distress.  Cardiovascular:     Rate and Rhythm: Normal rate and regular rhythm.     Pulses: Normal pulses.     Heart sounds: Normal heart sounds. No murmur heard.   No friction rub. No gallop.  Pulmonary:     Effort: Pulmonary effort is normal.     Breath sounds: Normal breath sounds. No wheezing, rhonchi or rales.  Abdominal:     General: Bowel sounds are normal.  Musculoskeletal:     Right lower leg: No edema.     Left lower leg: No edema.  Neurological:     Mental Status: He is alert.     Filed Weights   04/09/21 0046  Weight: 105.8 kg     Intake/Output Summary (Last 24 hours) at 04/10/2021 1327 Last data filed at 04/10/2021 0900 Gross per 24 hour  Intake 960 ml  Output 2750 ml  Net -1790 ml   Net IO Since Admission: 900.01 mL [04/10/21 1327]  Pertinent Labs: CBC Latest Ref Rng & Units 04/10/2021 04/09/2021 04/08/2021  WBC 4.0 - 10.5 K/uL 9.0 9.5 11.9(H)  Hemoglobin 13.0 - 17.0 g/dL 10.7(L) 9.9(L) 12.5(L)  Hematocrit 39.0 - 52.0 % 31.7(L) 30.6(L) 39.3  Platelets 150 - 400 K/uL 174 142(L) 181    CMP Latest Ref Rng & Units 04/10/2021 04/09/2021 04/09/2021  Glucose 70 - 99 mg/dL  126(H) 161(H) 146(H)  BUN 8 - 23 mg/dL 27(H) 31(H) 37(H)  Creatinine 0.61 - 1.24 mg/dL 1.54(H) 1.91(H) 2.27(H)  Sodium 135 - 145 mmol/L 135 136 135  Potassium 3.5 - 5.1 mmol/L 4.8 4.9 4.8  Chloride 98 - 111 mmol/L 105 106 105  CO2 22 - 32 mmol/L 26 23 23   Calcium 8.9 - 10.3 mg/dL 8.8(L) 8.8(L) 8.6(L)  Total Protein 6.5 - 8.1 g/dL - - -  Total Bilirubin 0.3 - 1.2 mg/dL - - -  Alkaline Phos 38 - 126 U/L - - -  AST 15 - 41 U/L - - -  ALT 0 - 44 U/L - - -    Assessment/Plan:   Principal Problem:   AKI (acute kidney injury) ([place]) Active Problems:   Essential hypertension   (HFpEF) heart failure with preserved ejection fraction (HCC)   Hyperkalemia   Patient Summary: Arthur [name]. is a 80 y.o. with a pertinent PMH of nonischemic heart failure with preserved EF and HTN, who presented with increasing fatigue, generalized weakness and admitted for decreased blood pressure recorded at home 80/40 and was advised to come to hospital by PCP. He has  a history of prostate cancer and prostatectomy that was complicated by urinary incontinence. He has had good compliance with his medications.    #Acute kidney injury Continued improvement in his kidney function with a creatinine of 1.54 today from 1.91 on 04/09/2021.  Patient appears euvolemic on today's examination.  He is currently making urine which he describes as yellow. -Continue to hold entresto, spiro, lasix, and chlorthalidone -Continue lactated Ringer infusion 125 mL an hour.  Orthostatic Hypotensive Likely 2/2 to Dehydration Physical therapy was consulted for this gentleman per recommendations prior to discharge.  Received message from physical therapy stating that patient was unable to stand for 3 minutes after checking orthostatics secondary to dizziness, and recommended to hold discharge today.  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  He was severely dehydrated and has been receiving intravenous fluids which were held yesterday at 1300.  He is net +1.1 L.  We will restart LR infusion today.  We appreciate physical therapy's evaluation recommendations. - Continue to work with PT - LR infusion 125 mL an hour.  Chronic HFpEF:   Will hold heart failure medications at discharge.  Normocytic anemia Baseline 12.4-13.7. No obvious signs of bleeding in urine or stool  - will follow up ferritin - repeat CBC  COPD Lung nodules Follows with Dr. Elsworth Soho. Stable on serial CT.  - Continue to monitor.  HLD -Continue Lipitor 40 mg daily  Hypothyroid -Continue home Synthroid 200 MCG daily  GERD -Continue Protonix 40 mg daily  Hyperkalemia: RESOLVED Diet: Normal IVF: LR,None VTE: Heparin Code: Full   Dispo: Anticipated discharge to  pending  in 1-2 days pending clinical improvement.   Maudie Mercury, MD Internal Medicine Resident PGY-3 Pager [phone] Please contact the on call pager after 5 pm and on weekends at [phone].

## 2021-04-11 ENCOUNTER — Inpatient Hospital Stay (HOSPITAL_COMMUNITY): Payer: PPO

## 2021-04-11 LAB — BASIC METABOLIC PANEL
Anion gap: 7 (ref 5–15)
BUN: 21 mg/dL (ref 8–23)
CO2: 27 mmol/L (ref 22–32)
Calcium: 9.2 mg/dL (ref 8.9–10.3)
Chloride: 105 mmol/L (ref 98–111)
Creatinine, Ser: 1.24 mg/dL (ref 0.61–1.24)
GFR, Estimated: 60 mL/min — ABNORMAL LOW (ref >60)
Glucose, Bld: 93 mg/dL (ref 70–99)
Potassium: 4.8 mmol/L (ref 3.5–5.1)
Sodium: 139 mmol/L (ref 135–145)

## 2021-04-11 MED ORDER — ACETAMINOPHEN 325 MG PO TABS
650.0000 mg | ORAL_TABLET | Freq: Four times a day (QID) | ORAL | Status: AC | PRN
Start: 2021-04-11 — End: 2021-04-11
  Administered 2021-04-11: 650 mg via ORAL

## 2021-04-11 MED ORDER — ACETAMINOPHEN 325 MG PO TABS
325.0000 mg | ORAL_TABLET | Freq: Four times a day (QID) | ORAL | Status: DC | PRN
  Filled 2021-04-11: qty 1

## 2021-04-11 NOTE — TOC Progression Note (Signed)
Transition of Care (TOC) - Progression Note    Patient Details  Name: Arthur [name]. MRN: [id number] Date of Birth: August 09, 1941  Transition of Care [facility]) CM/SW Contact  Bartholomew Crews, RN Phone Number: [phone] 04/11/2021, 11:55 AM  Clinical Narrative:     Spoke with patient at the bedside. Discussed recommendations from PT for outpatient PT. Patient agreeable. Discussed different locations - patient requests [facility]. Location. Referral placed. Patient advised to call and make appointment.  Patient stated that his "bossman" will likely pick him up at discharge.   TOC following.   Expected Discharge Plan: OP Rehab Barriers to Discharge: Continued Medical Work up  Expected Discharge Plan and Services Expected Discharge Plan: OP Rehab In-house Referral: NA Discharge Planning Services: CM Consult   Living arrangements for the past 2 months: Single Family Home                 DME Arranged: N/A DME Agency: NA       HH Arranged: NA HH Agency: NA         Social Determinants of Health (SDOH) Interventions    Readmission Risk Interventions No flowsheet data found.

## 2021-04-11 NOTE — Progress Notes (Signed)
Physical Therapy Treatment Patient Details Name: Arthur [name]. 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 +dehydration ?orthostasisPMH chronic nonischemic heart failure with preserved ejection fraction, hypertension, prostate Cancer, urinary incontinence, COPD    PT Comments    Patient reports several week h/o "feeling drunk" and off-balance and had been contributing it to the heat. Vestibular evaluation completed (see below for details). Patient demonstrated a mixture of both peripheral and central vestibular deficits. Central signs included: vertical skew of eyes with testing oculomotor ROM, saccadic movements during testing smooth pursuits, double-vision coming and going during assessment, increased symptoms with VOR cancellation. Peripheral signs included: positive head impulse test (although pt self-limiting with ROM due to h/o neck surgery), duration of symptoms are 0-60 seconds, symptoms are intermittent.   Overall, pt's balance is improved this date and although orthostatic BPs still dropped, they was not enough drop to call it orthostasis. Patient was able to walk in hallway and maintain straight path if looking straight ahead. With head turns, he would stagger and/or drift (especially when looking to his right). As pt has improved, feel he could discharge home if MD does not feel further work-up related to central vestibular deficits is indicated.     Follow Up Recommendations  Outpatient PT;Supervision - Intermittent (OPPT for vestibular rehab; discussed caution with driving and head turns)     Equipment Recommendations  None recommended by PT    Recommendations for Other Services      04/11/21 0001  Vestibular Assessment  General Observation pt reports recent periods of feeling off-balance or "drunk"   Symptom Behavior  Subjective history of current problem reports episodes began recently and he attributed to the heat; denies falls; denies spinning; +blurry and sometimes double-vision  Type of Dizziness  Blurred vision;Diplopia;Imbalance;Unsteady with head/body turns  Frequency of Dizziness throughout the day  Duration of Dizziness seconds to minute  Symptom Nature Motion provoked;Intermittent  Aggravating Factors Turning head quickly;Turning head sideways;Supine to sit;Sit to stand  Relieving Factors Head stationary;Slow movements;Avoiding busy/distracting areas  Progression of Symptoms Better (than 7/9)  History of similar episodes none  Oculomotor Exam  Oculomotor Alignment Abnormal (left eye elevated compared to right)  Ocular ROM WFL  Spontaneous Absent  Gaze-induced  Absent  Smooth Pursuits Saccades  Saccades Undershoots  Vestibulo-Ocular Reflex  VOR 1 Head Only (x 1 viewing) very slow, stops to blink and "reset" his vision  VOR Cancellation Normal  Comment limited ROM/rotation with VOR cancellation; did cause incr symptoms  Positional Testing  Dix-Hallpike Dix-Hallpike Right;Dix-Hallpike Left  Horizontal Canal Testing Horizontal Canal Right;Horizontal Canal Left  Dix-Hallpike Right  Dix-Hallpike Right Duration 0  Dix-Hallpike Right Symptoms No nystagmus  Dix-Hallpike Left  Dix-Hallpike Left Duration 0  Dix-Hallpike Left Symptoms No nystagmus  Horizontal Canal Right  Horizontal Canal Right Duration 0  Horizontal Canal Right Symptoms Normal  Horizontal Canal Left  Horizontal Canal Left Duration 0  Horizontal Canal Left Symptoms Normal  Cognition  Cognition Orientation Level Oriented x 4  Orthostatics  BP supine (x 5 minutes) 128/74  HR supine (x 5 minutes) 69  BP sitting 118/67  HR sitting 72  BP standing (after 1 minute) 108/71  HR standing (after 1 minute) 75  BP standing (after 3 minutes) 109/76  HR standing (after 3 minutes) 79  Orthostatics Comment  denied lightheadedness; static standing did have periods of  imbalance and sel-correcting     Precautions / Restrictions Precautions Precautions: Fall Precaution Comments: denies falls; more frequent episodes of "feeling drunk" Restrictions Weight Bearing Restrictions: No    Mobility  Bed Mobility Overal bed mobility: Independent             General bed mobility comments: rolling, supine to sit, sit to supine    Transfers Overall transfer level: Needs assistance Equipment used: None Transfers: Sit to/from Stand Sit to Stand: Min guard;Supervision         General transfer comment: very slight balance adjustment without assistance upon standing each time  Ambulation/Gait Ambulation/Gait assistance: Min guard Gait Distance (Feet): 200 Feet Assistive device: None Gait Pattern/deviations: Step-through pattern;Decreased stride length;Drifts right/left     General Gait Details: does not drift when looking straight ahead, only with head turns   Marine scientist Rankin (Stroke Patients Only)       Balance Overall balance assessment: Needs assistance Sitting-balance support: No upper extremity supported;Feet supported Sitting balance-Leahy Scale: Good     Standing balance support: No upper extremity supported Standing balance-Leahy Scale: Poor Standing balance comment: posterior sway and then "catches" himself--much improved compared to 7/9                            Cognition Arousal/Alertness: Awake/alert Behavior During Therapy: [facility] for tasks assessed/performed Overall Cognitive Status: Within Functional Limits for tasks assessed                                        Exercises      General Comments        Pertinent Vitals/Pain Pain Assessment: No/denies pain    Home Living                      Prior Function            PT Goals (current goals can now be found in  the care plan section) Acute Rehab PT Goals Patient Stated Goal: to feel balanced when I am up on my feet, get back to dancing PT Goal Formulation: With patient Time For Goal Achievement: 04/24/21 Potential to Achieve Goals: Good Progress towards PT goals: Progressing toward goals    Frequency    Min 3X/week      PT Plan Discharge plan needs to be updated    Co-evaluation              AM-PAC PT "6 Clicks" Mobility   Outcome Measure  Help needed turning from your back to your side while in a flat bed without using bedrails?: None Help needed moving from lying on your back to sitting on the side of a flat bed without using bedrails?: None Help needed moving to and from a bed to a chair (including a wheelchair)?: A Little Help needed standing up from a chair using your arms (e.g., wheelchair or bedside chair)?: A Little Help needed to walk in hospital room?: A Little Help needed climbing 3-5 steps with a railing? : A Little 6 Click Score: 20    End of Session Equipment Utilized During Treatment: Gait belt Activity Tolerance: Patient tolerated treatment well Patient left: with call bell/phone within reach;in bed;with bed alarm set Nurse Communication: Mobility status;Other (comment) (  ortho BPs) PT Visit Diagnosis: Unsteadiness on feet (R26.81)     Time: 7366-8159 PT Time Calculation (min) (ACUTE ONLY): 58 min  Charges:  $Therapeutic Activity: 38-52 mins                      Arby Barrette, PT Pager [phone]    Rexanne Mano 04/11/2021, 11:40 AM

## 2021-04-11 NOTE — Discharge Summary (Addendum)
Name: Arthur [name]. MRN: [id number] DOB: 1941-06-08 80 y.o. PCP: Wendie Agreste, MD  Date of Admission: 04/08/2021  3:07 PM Date of Discharge: 04/12/2021 Attending Physician: Lalla Brothers MD  Discharge Diagnosis: 1. Acute kidney injury 2. Orthostatic Hypotensive Likely 2/2 to Dehydration 3. Chronic HFpEF 4. Hyperkalemia:  5. Normocytic anemia 6. COPD/Lung nodules 7. Vestibular Imbalance   Discharge Medications: Allergies as of 04/12/2021       Reactions   Doxycycline Other (See Comments)   Esophagitis, severe gi bleed        Medication List     STOP taking these medications    Anoro Ellipta 62.5-25 MCG/INH Aepb Generic drug: umeclidinium-vilanterol   chlorthalidone 25 MG tablet Commonly known as: HYGROTON   furosemide 20 MG tablet Commonly known as: LASIX   spironolactone 25 MG tablet Commonly known as: ALDACTONE       TAKE these medications    albuterol 108 (90 Base) MCG/ACT inhaler Commonly known as: VENTOLIN HFA Inhale 1-2 puffs into the lungs every 6 (six) hours as needed for wheezing or shortness of breath.   atorvastatin 40 MG tablet Commonly known as: LIPITOR TAKE 1 TABLET BY MOUTH EVERY DAY   carvedilol 6.25 MG tablet Commonly known as: COREG TAKE 1 TABLET(6.25 MG) BY MOUTH TWICE DAILY WITH A MEAL What changed: See the new instructions.   Entresto 97-103 MG Generic drug: sacubitril-valsartan Take 1 tablet by mouth 2 (two) times daily.   levothyroxine 200 MCG tablet Commonly known as: SYNTHROID TAKE 1 TABLET(200 MCG) BY MOUTH DAILY What changed: See the new instructions.   pantoprazole 40 MG tablet Commonly known as: PROTONIX TAKE 1 TABLET(40 MG) BY MOUTH DAILY BEFORE BREAKFAST What changed:  how much to take how to take this when to take this additional instructions   Trelegy Ellipta 100-62.5-25 MCG/INH Aepb Generic drug: Fluticasone-Umeclidin-Vilant Inhale 1 puff into the lungs daily. What changed: Another  medication with the same name was removed. Continue taking this medication, and follow the directions you see here.        Disposition and follow-up:   Mr.Arthur [name]. was discharged from [facility] in Stable condition.  At the hospital follow up visit please address:  1. Acute kidney injury  - Holding home congestive heart failure medications with the exception of Entresto 97-103 mg twice daily  - Follow up BMP  2. Orthostatic Hypotensive Likely 2/2 to Dehydration  - Orthostatic blood pressures  3. Chronic HFpEF  - Continue Entresto 97-103 mg twice daily  - Evaluate volume status, DC weight of 109.3 kg. If hypervolemic consider PRN lasix.   4. Hyperkalemia:   - BMP  5. COPD/Lung nodules  - Follow up with Dr. Elsworth Soho  6. Vestibular Imbalance  - Follows up with PT/vestibular rehab  2.  Labs / imaging needed at time of follow-up: BMP  3.  Pending labs/ test needing follow-up: none  Follow-up Appointments:  Follow-up Hotel manager at [facility] Follow up.   Specialty: Family Medicine Why: Make a appointment to establish [facility] Contact information: [place]        [place]. Schedule an appointment as soon as possible for a visit in 1 week(s).   Contact information: [address] [phone]        [name], MD. Schedule an appointment as soon as possible for  a visit in 1 week(s).   Specialty: Cardiology Contact information: [address] [address] [place] by problem list: 1. Acute kidney injury Patient is a 80 year old male who was evaluated for acute renal injury with creatinine of 2.9 and hyperkalemia of 6.3. Urinalysis showed no abnormalities. A bladder scan was performed to rule out retention,  secondary to stricture due to his history of prostatectomy, which was unrevealing. CT renal stone study was negative. Suspect that his kidney injury was due to dehydration, and he was started on iv fluids and his GDMT was held. Urine output improved and his kidney function returned to baseline.   Hyperkalemia:  Patient presented to the Emergency department with a potasium level of 6.3 with  EKG changes. His hyperkalemia resolved with IV fluids.     Orthostatic Hypotensive Likely 2/2 to Dehydration Physical therapy was consulted for this gentleman per recommendations prior to discharge.  Physical therapy evaluated the patient and he was found to be orthostatic. He stayed an additional night for IV fluids with resolution of his symptoms. He was discharged in stable condition.    Chronic HFpEF:   Patient found to be hypovolemic on initial examination. He was given IV fluids and discharged in a euvolemic state. He was continued on his home dose of Entresto, and the remainder of his heart failure medications were held.   Vestibular Imbalance: Patient found to be orthostatic by physical therapy, after volume resuscitation he was found to have signs of vestibular imbalance  including vertical skew of the eyes while testing ocular movement, double vision, and saccadic movements during smooth pursuits. MRI brain without contrast showed no signs of neurological insult. He was discharged with home PT and recommendations for vestibular rehab.    Discharge Exam:   BP 126/73 (BP Location: Left Arm)   Pulse 62   Temp 97.9 F (36.6 C) (Oral)   Resp 18   Wt 109.3 kg   SpO2 96%   BMI 32.68 kg/m  Discharge exam: Constitutional: well-appearing, sitting in bed, in no acute distress HENT: normocephalic atraumatic, mucous membranes moist Cardiovascular: regular rate and rhythm, no m/r/g Pulmonary/Chest: normal work of breathing on room air, lungs clear to auscultation bilaterally Abdominal: soft, non-tender,  non-distended Neurological: alert & oriented x 3, 4/5 strength in bilateral lower extremities Skin: warm and dry Psych: normal mood and affect  Pertinent Labs, Studies, and Procedures:  BMP Latest Ref Rng & Units 04/12/2021 04/11/2021 04/10/2021  Glucose 70 - 99 mg/dL 120(H) 93 126(H)  BUN 8 - 23 mg/dL 18 21 27(H)  Creatinine 0.61 - 1.24 mg/dL 1.22 1.24 1.54(H)  BUN/Creat Ratio 10 - 24 - - -  Sodium 135 - 145 mmol/L 135 139 135  Potassium 3.5 - 5.1 mmol/L 4.4 4.8 4.8  Chloride 98 - 111 mmol/L 102 105 105  CO2 22 - 32 mmol/L 26 27 26   Calcium 8.9 - 10.3 mg/dL 9.2 9.2 8.8(L)   CBC Latest Ref Rng & Units 04/10/2021 04/09/2021 04/08/2021  WBC 4.0 - 10.5 K/uL 9.0 9.5 11.9(H)  Hemoglobin 13.0 - 17.0 g/dL 10.7(L) 9.9(L) 12.5(L)  Hematocrit 39.0 - 52.0 % 31.7(L) 30.6(L) 39.3  Platelets 150 - 400 K/uL 174 142(L) 181    EXAM: MRI HEAD WITHOUT CONTRAST   TECHNIQUE: Multiplanar, multiecho pulse sequences of the brain and surrounding structures were obtained without intravenous contrast.   COMPARISON:  MRI March 20, 2015.   FINDINGS: Brain: No acute infarction, hemorrhage, hydrocephalus, extra-axial collection or mass lesion. Scattered mild for age T2/FLAIR hyperintensities within the white matter, nonspecific but potentially related to chronic microvascular ischemic disease. Prominent dilated perivascular spaces, similar to prior. Mild atrophy.   Vascular: Major arterial flow voids are maintained at the skull base.   Skull and upper cervical spine: Normal marrow signal.   Sinuses/Orbits: Moderate mucosal thickening of scattered ethmoid air cells and bilateral sphenoid sinuses. Mild bilateral frontal sinus and maxillary sinus mucosal thickening   Other: Small bilateral mastoid effusions.   IMPRESSION: 1. No evidence of acute intracranial abnormality. Specifically, no acute infarct. 2. Mild for age chronic microvascular ischemic disease. 3. Moderate paranasal sinus disease, as detailed  above. 4. Small bilateral mastoid effusions.   CLINICAL DATA:  Flank pain   EXAM: CT ABDOMEN AND PELVIS WITHOUT CONTRAST   TECHNIQUE: Multidetector CT imaging of the abdomen and pelvis was performed following the standard protocol without IV contrast.   COMPARISON:  CT 12/27/2019   FINDINGS: Lower chest: Lung bases demonstrate no acute consolidation or effusion.   Hepatobiliary: No focal liver abnormality is seen. Status post cholecystectomy. No biliary dilatation.   Pancreas: Unremarkable. No pancreatic ductal dilatation or surrounding inflammatory changes.   Spleen: Normal in size without focal abnormality.   Adrenals/Urinary Tract: Adrenal glands are normal. Kidneys show no hydronephrosis. Multiple low-density lesions in the kidneys consistent with cysts. 7 mm nonobstructing stone lower pole left kidney. The bladder is nearly empty   Stomach/Bowel: Stomach is within normal limits. Appendix appears normal. No evidence of bowel wall thickening, distention, or inflammatory changes. Diverticular disease of the left colon without acute wall thickening.   Vascular/Lymphatic: Moderate aortic atherosclerosis. No aneurysm. No suspicious nodes   Reproductive: Status post prostatectomy.   Other: Negative for free air or free fluid.   Musculoskeletal: No acute or suspicious osseous abnormality.   IMPRESSION: 1. No CT evidence for acute intra-abdominal or pelvic abnormality. 2. Nonobstructing stone in the left kidney 3. Diverticular disease of the left colon without acute inflammatory process  Discharge Instructions: Discharge Instructions     (HEART FAILURE PATIENTS) Call MD:  Anytime you have any of the following symptoms: 1) 3 pound weight gain in 24 hours or 5 pounds in 1 week 2) shortness of breath, with or without a dry hacking cough 3) swelling in the hands, feet or stomach 4) if you have to sleep on extra pillows at night in order to breathe.   Complete by: As  directed    Ambulatory referral to Physical Therapy   Complete by: As directed    Call MD for:  difficulty breathing, headache or visual disturbances   Complete by: As directed    Call MD for:  difficulty breathing, headache or visual disturbances   Complete by: As directed    Call MD for:  extreme fatigue   Complete by: As directed    Call MD for:  extreme fatigue   Complete by: As directed    Call MD for:  persistant dizziness or light-headedness   Complete by: As directed    Call MD for:  persistant dizziness or light-headedness   Complete by: As directed    Call MD for:  persistant nausea and vomiting   Complete by: As directed    Call MD for:  persistant nausea and vomiting   Complete by: As directed    Diet - low sodium heart healthy   Complete by:  As directed    Increase activity slowly   Complete by: As directed    Increase activity slowly   Complete by: As directed      It was a pleasure taking care of you during your stay here at [facility].  During your stay you were admitted for an acute kidney injury likely from being dehydrated.  Your potassium was also noted to be high. During your stay you were given intravenous fluids and medications, with return of your kidney function and a normal potassium level.  After discharge, please stop taking Lasix, spironolactone, and chlorthalidone.  Please continue taking your Entresto on April 12, 2021.  We will have you follow-up in our internal medicine clinic here at [facility] later this week.  Please reach out to your cardiologist to schedule appointment in the next 1 to 2 weeks.  Signed: Maudie Mercury, MD 04/12/2021, 5:07 PM   Pager: [phone]

## 2021-04-11 NOTE — Progress Notes (Addendum)
HD#2 Subjective:  Overnight Events: no acute events overnight   Patient seen and evaluated at bedside. He reports that he is feeling better today. He feels a little stronger. Numbness and tingling in hands is slowly improving. Eating and drinking well. He is urinating often and it is light yellow in color. No complaints at this time.  Objective:  Vital signs in last 24 hours: Vitals:   04/10/21 1736 04/10/21 2248 04/11/21 0540 04/11/21 1210  BP: 135/68 140/78 138/74 128/63  Pulse: 70 66 65 73  Resp: 19 20 18 17   Temp: 98 F (36.7 C) 98.3 F (36.8 C) 97.9 F (36.6 C) 97.8 F (36.6 C)  TempSrc: Oral Oral Oral Oral  SpO2: 99% 96% 98% 99%  Weight:   109.3 kg    Supplemental O2: Room Air SpO2: 99 %   Physical Exam:  Constitutional: well-appearing, sitting in bed, in no acute distress HENT: normocephalic atraumatic, mucous membranes moist Cardiovascular: regular rate and rhythm, no m/r/g Pulmonary/Chest: normal work of breathing on room air, lungs clear to auscultation bilaterally Abdominal: soft, non-tender, non-distended Neurological: alert & oriented x 3, 4/5 strength in bilateral lower extremities Skin: warm and dry Psych: normal mood and affect  Filed Weights   04/09/21 0046 04/11/21 0540  Weight: 105.8 kg 109.3 kg     Intake/Output Summary (Last 24 hours) at 04/11/2021 1550 Last data filed at 04/11/2021 1417 Gross per 24 hour  Intake 1149.18 ml  Output 2090 ml  Net -940.82 ml   Net IO Since Admission: 542.24 mL [04/11/21 1550]  Pertinent Labs: CBC Latest Ref Rng & Units 04/10/2021 04/09/2021 04/08/2021  WBC 4.0 - 10.5 K/uL 9.0 9.5 11.9(H)  Hemoglobin 13.0 - 17.0 g/dL 10.7(L) 9.9(L) 12.5(L)  Hematocrit 39.0 - 52.0 % 31.7(L) 30.6(L) 39.3  Platelets 150 - 400 K/uL 174 142(L) 181    CMP Latest Ref Rng & Units 04/11/2021 04/10/2021 04/09/2021  Glucose 70 - 99 mg/dL 93 126(H) 161(H)  BUN 8 - 23 mg/dL 21 27(H) 31(H)  Creatinine 0.61 - 1.24 mg/dL 1.24 1.54(H) 1.91(H)   Sodium 135 - 145 mmol/L 139 135 136  Potassium 3.5 - 5.1 mmol/L 4.8 4.8 4.9  Chloride 98 - 111 mmol/L 105 105 106  CO2 22 - 32 mmol/L 27 26 23   Calcium 8.9 - 10.3 mg/dL 9.2 8.8(L) 8.8(L)  Total Protein 6.5 - 8.1 g/dL - - -  Total Bilirubin 0.3 - 1.2 mg/dL - - -  Alkaline Phos 38 - 126 U/L - - -  AST 15 - 41 U/L - - -  ALT 0 - 44 U/L - - -    Imaging: No results found.  Assessment/Plan:   Principal Problem:   AKI (acute kidney injury) ([place]) Active Problems:   Essential hypertension   (HFpEF) heart failure with preserved ejection fraction (HCC)   Hyperkalemia   Patient Summary: Arthur [name]. is a 80 y.o. with a pertinent PMH of nonischemic heart failure with preserved EF and HTN, who presented with increasing fatigue, generalized weakness and admitted for decreased blood pressure recorded at home 80/40 and was advised to come to hospital by PCP. He has a history of prostate cancer and prostatectomy that was complicated by urinary incontinence. He has had good compliance with his medications. Patient's renal function has returned to normal, MRI brain negative. He is stable medically, being boarded overnight due to transportation.    #Acute kidney injury Continued improvement in his kidney function with a creatinine of 1.24 today.  Patient appears euvolemic on today's examination.  He is currently making urine which he describes as yellow. LR infusion being held. Patient's renal function has returned to normal. He is stable medically. -Continue to hold entresto, spiro, lasix, and chlorthalidone    Orthostatic Hypotensive Likely 2/2 to Dehydration Physical therapy was consulted per recommendations prior to discharge.  Received message from physical therapy stating that patient was unable to stand for 3 minutes after checking orthostatics secondary to dizziness, and recommended to hold discharge today. PT recommended MRI brain to evaluate for possible cerebellar lesion due to  vestibular symptoms. 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  He is net +1.1 L.     Hyperkalemia: resolved Patient hyperkalemia has resolved with IVF, Still reporting some weakness. MRI brain ordered per PT recommendations and has returned as negative.   Chronic HFpEF:   Euvolemic at this time Will hold heart failure medications at discharge.   Normocytic anemia Baseline 12.4-13.7. No obvious signs of bleeding in urine or stool   COPD Lung nodules Follows with Dr. Elsworth Soho. Stable on serial CT.  - Continue to monitor.   HLD -Continue Lipitor 40 mg daily   Hypothyroid -Continue home Synthroid 200 MCG daily   GERD -Continue Protonix 40 mg daily    Diet: Normal IVF: None,None VTE: Heparin Code: Full PT/OT recs: outpatient PT intermittent supervision none.   Dispo: Anticipated discharge to Home in 1 days pending transportation.   Delene Ruffini, MD Internal Medicine Resident PGY-1 Pager [phone] Please contact the on call pager after 5 pm and on weekends at [phone].

## 2021-04-12 ENCOUNTER — Encounter (HOSPITAL_COMMUNITY): Payer: Self-pay | Admitting: Student in an Organized Health Care Education/Training Program

## 2021-04-12 DIAGNOSIS — R260 Ataxic gait: Secondary | ICD-10-CM

## 2021-04-12 LAB — BASIC METABOLIC PANEL
Anion gap: 7 (ref 5–15)
BUN: 18 mg/dL (ref 8–23)
CO2: 26 mmol/L (ref 22–32)
Calcium: 9.2 mg/dL (ref 8.9–10.3)
Chloride: 102 mmol/L (ref 98–111)
Creatinine, Ser: 1.22 mg/dL (ref 0.61–1.24)
GFR, Estimated: >60 mL/min (ref >60)
Glucose, Bld: 120 mg/dL — ABNORMAL HIGH (ref 70–99)
Potassium: 4.4 mmol/L (ref 3.5–5.1)
Sodium: 135 mmol/L (ref 135–145)

## 2021-04-12 MED ORDER — ENTRESTO 97-103 MG PO TABS: 1.0000 | ORAL_TABLET | Freq: Two times a day (BID) | ORAL | 3 refills | Status: DC

## 2021-04-12 NOTE — Progress Notes (Signed)
Patient has ordered for discharge. Given discharge instructions with paper to the patient. Iv removed. Given all belongings to the patient. 

## 2021-04-12 NOTE — Progress Notes (Signed)
Occupational Therapy Treatment Patient Details Name: Arthur [name]. 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 +dehydration ?orthostasisPMH chronic nonischemic heart failure with preserved ejection fraction, hypertension, prostate Cancer, urinary incontinence, COPD   OT comments  This 80 yo male admitted with above presents to acute OT at a min guard A-S level when up on his feet with [facility] for basic ADLs (has a walking stick at home he plans to use). His balance is better today for ADLs overall but he still has moments of swaying where he has to correct his balance. He will continue to benefit from acute OT without need for OT follow up. 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  Follow Up Recommendations  No OT follow up;Supervision/Assistance - 24 hour    Equipment Recommendations  None recommended by OT          Precautions / Restrictions Precautions Precautions: Fall Precaution Comments: denies falls; more frequent episodes of "feeling drunk" Restrictions Weight Bearing Restrictions: No       Mobility Bed Mobility Overal bed mobility: Independent             General bed mobility comments: up in recliner upon arrival    Transfers Overall transfer level: Needs assistance Equipment used: Straight cane Transfers: Sit to/from Stand Sit to Stand: Supervision         General transfer comment: very slight balance adjustment without assistance upon standing each time    Balance Overall balance assessment: Needs assistance Sitting-balance support: No upper extremity supported;Feet supported Sitting balance-Leahy Scale: Good     Standing balance support: Single extremity supported Standing balance-Leahy Scale: Poor Standing  balance comment: posterior sway and then "catches" himself--much improved compared to 7/9                           ADL either performed or assessed with clinical judgement   ADL Overall ADL's : Needs assistance/impaired     Grooming: Supervision/safety;Standing;Wash/dry face;Oral care Grooming Details (indicate cue type and reason): Had moments of swaying that he self corrected                 Toilet Transfer: Min guard;Ambulation;Regular Glass blower/designer Details (indicate cue type and reason): SPC     Tub/ Shower Transfer: Walk-in shower;Min guard;Ambulation Tub/Shower Transfer Details (indicate cue type and reason): [place]         Vision Patient Visual Report: No change from baseline            Cognition Arousal/Alertness: Awake/alert Behavior During Therapy: WFL for tasks assessed/performed Overall Cognitive Status: Within Functional Limits for tasks assessed                                                     Pertinent Vitals/ Pain       Pain Assessment: No/denies pain         Frequency  Min 2X/week        Progress Toward Goals  OT Goals(current goals can now be found in the care plan section)  Progress towards OT goals: Progressing  toward goals  Acute Rehab OT Goals Patient Stated Goal: 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 OT Goal Formulation: With patient Time For Goal Achievement: 04/24/21 Potential to Achieve Goals: Good  Plan Discharge plan remains appropriate    AM-PAC OT "6 Clicks" Daily Activity     Outcome Measure   Help from another person eating meals?: None Help from another person taking care of personal grooming?: A Little Help from another person toileting, which includes using toliet, bedpan, or urinal?: A Little Help from another person bathing (including washing, rinsing, drying)?: A Little Help from another person to put on and taking off regular upper body clothing?: A  Little Help from another person to put on and taking off regular lower body clothing?: A Little 6 Click Score: 19    End of Session Equipment Utilized During Treatment: Gait belt  OT Visit Diagnosis: Unsteadiness on feet (R26.81);Other abnormalities of gait and mobility (R26.89);Dizziness and giddiness (R42)   Activity Tolerance Patient tolerated treatment well   Patient Left in chair;with call bell/phone within reach             Time: 0850-0912 OT Time Calculation (min): 22 min  Charges: OT General Charges $OT Visit: 1 Visit OT Treatments $Self Care/Home Management : 8-22 mins  [address], OTR/L Acute [employer] Pager [phone] Office [phone]     Almon Register 04/12/2021, 12:48 PM

## 2021-04-12 NOTE — Progress Notes (Signed)
Physical Therapy Treatment Patient Details Name: Arthur [name]. 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 +dehydration ?orthostasisPMH chronic nonischemic heart failure with preserved ejection fraction, hypertension, prostate Cancer, urinary incontinence, COPD    PT Comments    Patient continues with episodes of imbalance with "catching" himself when not using device. With use of cane, no such episodes. Patient educated in proper use and sequencing with cane and quickly used cane appropriately. Patient has a walking stick with a rubber tip at home and told him this may work even better for him than the cane. Educated on cost and places to obtain cane should he choose to get one.     Follow Up Recommendations  Outpatient PT;Supervision - Intermittent (OPPT for vestibular rehab; discussed caution with driving and head turns)     Equipment Recommendations  None recommended by PT (pt owns walking stick with rubber tip)    Recommendations for Other Services       Precautions / Restrictions Precautions Precautions: Fall Precaution Comments: denies falls; more frequent episodes of "feeling drunk" Restrictions Weight Bearing Restrictions: No    Mobility  Bed Mobility Overal bed mobility: Independent             General bed mobility comments: rolling, supine to sit    Transfers Overall transfer level: Needs assistance Equipment used: Straight cane Transfers: Sit to/from Stand Sit to Stand: Supervision         General transfer comment: very slight balance adjustment without assistance upon standing each time  Ambulation/Gait Ambulation/Gait assistance: Min guard;Supervision Gait Distance (Feet): 200 Feet Assistive device: Straight cane Gait Pattern/deviations: Step-through pattern;Decreased  stride length;Wide base of support   Gait velocity interpretation: 1.31 - 2.62 ft/sec, indicative of limited community ambulator General Gait Details: with cane no drifting rt or lt with head turns, looking up/down; without cane x 2 staggering steps due to imbalance   Stairs             Wheelchair Mobility    Modified Rankin (Stroke Patients Only)       Balance Overall balance assessment: Needs assistance Sitting-balance support: No upper extremity supported;Feet supported Sitting balance-Leahy Scale: Good     Standing balance support: No upper extremity supported Standing balance-Leahy Scale: Poor Standing balance comment: posterior sway and then "catches" himself--much improved compared to 7/9                            Cognition Arousal/Alertness: Awake/alert Behavior During Therapy: [facility] for tasks assessed/performed Overall Cognitive Status: Within Functional Limits for tasks assessed                                        Exercises      General Comments        Pertinent Vitals/Pain Pain Assessment: No/denies pain    Home Living                      Prior Function            PT Goals (current goals can now be found in the care plan section) Acute Rehab PT Goals Patient Stated Goal: to feel balanced when I am up on my feet,  get back to dancing Time For Goal Achievement: 04/24/21 Potential to Achieve Goals: Good Progress towards PT goals: Progressing toward goals    Frequency    Min 3X/week      PT Plan Current plan remains appropriate    Co-evaluation              AM-PAC PT "6 Clicks" Mobility   Outcome Measure  Help needed turning from your back to your side while in a flat bed without using bedrails?: None Help needed moving from lying on your back to sitting on the side of a flat bed without using bedrails?: None Help needed moving to and from a bed to a chair (including a wheelchair)?: A  Little Help needed standing up from a chair using your arms (e.g., wheelchair or bedside chair)?: A Little Help needed to walk in hospital room?: A Little Help needed climbing 3-5 steps with a railing? : A Little 6 Click Score: 20    End of Session Equipment Utilized During Treatment: Gait belt Activity Tolerance: Patient tolerated treatment well Patient left: with call bell/phone within reach;in chair Nurse Communication: Mobility status PT Visit Diagnosis: Unsteadiness on feet (R26.81)     Time: 6283-6629 PT Time Calculation (min) (ACUTE ONLY): 22 min  Charges:  $Gait Training: 8-22 mins                      Arby Barrette, PT Pager [phone]    Rexanne Mano 04/12/2021, 9:27 AM

## 2021-04-13 ENCOUNTER — Telehealth: Payer: Self-pay | Admitting: Internal Medicine

## 2021-04-13 DIAGNOSIS — R42 Dizziness and giddiness: Secondary | ICD-10-CM

## 2021-04-13 DIAGNOSIS — R5381 Other malaise: Secondary | ICD-10-CM

## 2021-04-13 NOTE — Telephone Encounter (Signed)
TOC HFU appointment 04/19/2021 at 9:45 am with Dr. Farrel Gordon.  Spoke with patient and he is aware.

## 2021-04-13 NOTE — Telephone Encounter (Signed)
-----   Message from Maudie Mercury, MD sent at 04/13/2021  2:01 PM EDT ----- Regarding: RE: Hospital Follow Up He can wait until Monday!  Maudie Mercury, MD ----- Message ----- From: Meriam Sprague Sent: 04/13/2021   1:48 PM EDT To: Maudie Mercury, MD Subject: RE: Hospital Follow Up                         No appts for this Friday.  Can he wait until Monday?  Just let me know.  Thanks,  [place] ----- Message ----- From: Maudie Mercury, MD Sent: 04/12/2021   2:37 PM EDT To: [facility] Front Desk Pool Subject: Hospital Follow Up                             Good Afternoon,   Could we please schedule Arthur [name] for a follow up appointment for this Friday 04/16/21? Thanks!  Maudie Mercury, MD
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
## 2021-04-14 ENCOUNTER — Encounter: Payer: Self-pay | Admitting: Cardiovascular Disease

## 2021-04-14 ENCOUNTER — Ambulatory Visit: Payer: PPO | Admitting: Cardiovascular Disease

## 2021-04-14 ENCOUNTER — Other Ambulatory Visit: Payer: Self-pay

## 2021-04-14 VITALS — BP 120/54 | HR 73 | Ht 72.0 in | Wt 243.8 lb

## 2021-04-14 DIAGNOSIS — R260 Ataxic gait: Secondary | ICD-10-CM | POA: Insufficient documentation

## 2021-04-14 DIAGNOSIS — T671XXD Heat syncope, subsequent encounter: Secondary | ICD-10-CM

## 2021-04-14 DIAGNOSIS — I420 Dilated cardiomyopathy: Secondary | ICD-10-CM | POA: Diagnosis not present

## 2021-04-14 DIAGNOSIS — R5381 Other malaise: Secondary | ICD-10-CM | POA: Insufficient documentation

## 2021-04-14 MED ORDER — ENTRESTO 49-51 MG PO TABS: 1.0000 | ORAL_TABLET | Freq: Two times a day (BID) | ORAL | 11 refills | Status: DC

## 2021-04-14 NOTE — Patient Instructions (Addendum)
Medication Instructions:  Your physician has recommended you make the following change in your medication:  1-Decrease Entresto to 49/51 mg by mouth twice daily  *If you need a refill on your cardiac medications before your next appointment, please call your pharmacy*  Lab Work: If you have labs (blood work) drawn today and your tests are completely normal, you will receive your results only by: [place] (if you have MyChart) OR A paper copy in the mail If you have any lab test that is abnormal or we need to change your treatment, we will call you to review the results.  Testing/Procedures: None ordered at this time.  Follow-Up: At [facility], you and your health needs are our priority.  As part of our continuing mission to provide you with exceptional heart care, we have created designated Provider Care Teams.  These Care Teams include your primary Cardiologist (physician) and Advanced Practice Providers (APPs -  Physician Assistants and Nurse Practitioners) who all work together to provide you with the care you need, when you need it.  We recommend signing up for the patient portal called "MyChart".  Sign up information is provided on this After Visit Summary.  MyChart is used to connect with patients for Virtual Visits (Telemedicine).  Patients are able to view lab/test results, encounter notes, upcoming appointments, etc.  Non-urgent messages can be sent to your provider as well.   To learn more about what you can do with MyChart, go to [email or web address].    Your next appointment:   Keep your already scheduled appointment  The format for your next appointment:   In Person  Provider:   You may see Jenkins Rouge, MD or one of the following Advanced Practice Providers on your designated Care Team:   Cecilie Kicks, NP

## 2021-04-15 NOTE — Telephone Encounter (Signed)
Transition Care Management Follow-up Telephone Call Date of discharge and from where: Discharged 04/12/21 from the hospital. How have you been since you were released from the hospital? Stated he has been doing "very good"; but "a little weak". Any questions or concerns? No. He stated the unit was short-staffed which he understood but stated "poor service"".  Items Reviewed: Did the pt receive and understand the discharge instructions provided? Yes  Medications obtained and verified? Yes  Any new allergies since your discharge? No  Dietary orders reviewed? No Do you have support at home? Stated he lives alone. Also stated his son lives nearby but they do not have a close relationship.  Home Care and Equipment/Supplies: Were home health services ordered? no   Functional Questionnaire: (I = Independent and D = Dependent) ADLs: I  Meal Prep- I  Eating- I  Maintaining continence- I  Transferring/Ambulation- I  Managing Meds- I  Follow up appointments reviewed:  PCP Hospital f/u appt confirmed? Yes  Scheduled to see Dr Marlou Sa on Monday 04/19/21 @ [facility] f/u appt confirmed?  N/A.   Are transportation arrangements needed? No  If their condition worsens, is the pt aware to call PCP or go to the Emergency Dept.? Yes Was the patient provided with contact information for the PCP's office or ED? Yes Was to pt encouraged to call back with questions or concerns? Yes

## 2021-04-19 ENCOUNTER — Ambulatory Visit (INDEPENDENT_AMBULATORY_CARE_PROVIDER_SITE_OTHER): Payer: PPO | Admitting: Internal Medicine

## 2021-04-19 ENCOUNTER — Other Ambulatory Visit: Payer: Self-pay

## 2021-04-19 ENCOUNTER — Encounter: Payer: Self-pay | Admitting: Internal Medicine

## 2021-04-19 DIAGNOSIS — I10 Essential (primary) hypertension: Secondary | ICD-10-CM | POA: Diagnosis not present

## 2021-04-19 DIAGNOSIS — N179 Acute kidney failure, unspecified: Secondary | ICD-10-CM | POA: Diagnosis not present

## 2021-04-19 DIAGNOSIS — R260 Ataxic gait: Secondary | ICD-10-CM | POA: Diagnosis not present

## 2021-04-19 DIAGNOSIS — J432 Centrilobular emphysema: Secondary | ICD-10-CM

## 2021-04-19 DIAGNOSIS — H818X9 Other disorders of vestibular function, unspecified ear: Secondary | ICD-10-CM

## 2021-04-19 DIAGNOSIS — I5030 Unspecified diastolic (congestive) heart failure: Secondary | ICD-10-CM

## 2021-04-19 NOTE — Assessment & Plan Note (Signed)
Patient denies worsening shortness of breath or wheezing from baseline. Followed by pulmonology.

## 2021-04-19 NOTE — Assessment & Plan Note (Signed)
Patient denies needing to use a cane for ambulatory assistance and feels steady on his feet.

## 2021-04-19 NOTE — Assessment & Plan Note (Signed)
Patient denies worsening shortness of breath, orthopnea, swelling from baseline. Followed by cardiology.

## 2021-04-19 NOTE — Progress Notes (Deleted)
Patient ID: [name], male   DOB: [birth year], 80 y.o.   MRN: [id number]   CC: ***  HPI:  Mr.Tyronne M Serafino Burciaga. is a 80 y.o.   Past Medical History:  Diagnosis Date   Arthritis    Bifascicular block    a. 1st degree AVB/LBBB.   Cataract    Chronic systolic CHF (congestive heart failure) (HCC)    COPD (chronic obstructive pulmonary disease) (HCC)    ED (erectile dysfunction)    Essential hypertension, benign    chris guess  pcp   GERD (gastroesophageal reflux disease)    Hypercholesterolemia    Kidney stone    renal stone   LBBB (left bundle branch block) 10/2007   Migraines    Mild dietary indigestion    NICM (nonischemic cardiomyopathy) (HCC)    Nonischemic cardiomyopathy (HCC)    Prostate cancer (HCC)    Reflux esophagitis    Shortness of breath dyspnea    W/ EXERTION    Spider bite    Thyroid cancer ([place])    Tobacco use disorder    Unspecified hypothyroidism    Review of Systems:  Review of Systems  Constitutional:  Negative for chills and fever.  Respiratory:  Positive for shortness of breath and wheezing.   Cardiovascular:  Negative for chest pain and orthopnea.  Gastrointestinal:  Negative for diarrhea.  Genitourinary:  Negative for frequency and urgency.  Neurological:  Negative for dizziness, loss of consciousness, weakness and headaches.    Physical Exam:  Vitals:   04/19/21 0946  BP: 117/71  Pulse: 69  Temp: 98.6 F (37 C)  TempSrc: Oral  SpO2: 97%  Weight: 245 lb 11.2 oz (111.4 kg)  Height: 6' (1.829 m)   ***  Assessment & Plan:   See Encounters Tab for problem based charting.  Patient {GC/GE:[id number]::"discussed with","seen with"} Dr. {NAMES:[id number]::"Guilloud","Hoffman","Mullen","Narendra","Williams","Vincent"}

## 2021-04-19 NOTE — Assessment & Plan Note (Signed)
Patient has not been taking chlorthalidone, spironolactone, or lasix since being discharged from the hospital and he is taking 1/2 of his prescribed dose of Entresto. Normotensive at 117/71 in office today and denies symptoms of weakness, dizziness, loss of consciousness, headache.  - Continue entresto one time daily instead of 2. - Discontinue lasix, chlorthalidone, spironolactone - Patient advised to establish with a primary care provider. He has been made aware that we can provide primary care services for him if he is not able to establish with the office that he reports being in contact with.

## 2021-04-19 NOTE — Progress Notes (Signed)
Patient ID: [name], male   DOB: Oct 08, 1940, 80 y.o.   MRN: [id number]   CC: AKI admission follow-up  HPI:  Mr.Merlon M Dominyck Reser. is a 80 y.o. male with past medical history as listed below who presents for a follow-up after hospital admission for acute kidney injury. At this time he states that he feels the "best he's felt in a really long time." He is no longer having trouble ambulating as he was on presentation and is not using a cane for ambulation assistance. He denies ongoing diarrhea and reports drinking at least 8 bottles of water per day to maintain adequate hydration. 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 He has not started back on lasix, spironolactone or chlorthalidone since the hospital stay and is taking half of his original dose of Entresto. He is checking his weight daily and notes that there is minimal variation in his weight from day to day. He denies any new lower extremity swelling, changes in baseline shortness of breath and wheezing, or concerns regarding balance.   He no longer sees Dr. Carlota Raspberry for primary care and states that he has reached out to a different office to establish care at this time.   Past Medical History:  Diagnosis Date   Arthritis    Bifascicular block    a. 1st degree AVB/LBBB.   Cataract    Chronic systolic CHF (congestive heart failure) (HCC)    COPD (chronic obstructive pulmonary disease) (HCC)    ED (erectile dysfunction)    Essential hypertension, benign    chris guess  pcp   GERD (gastroesophageal reflux disease)    Hypercholesterolemia    Kidney stone    renal stone   LBBB (left bundle branch block) 10/2007   Migraines    Mild dietary indigestion    NICM (nonischemic cardiomyopathy) (HCC)    Nonischemic cardiomyopathy (HCC)    Prostate cancer (HCC)    Reflux esophagitis    Shortness of breath dyspnea    W/ EXERTION     Spider bite    Thyroid cancer (HCC)    Tobacco use disorder    Unspecified hypothyroidism    Review of Systems:  Review of Systems  Constitutional:  Negative for chills, fever, malaise/fatigue and weight loss.  Respiratory:  Positive for shortness of breath and wheezing. Negative for cough.   Cardiovascular:  Positive for leg swelling. Negative for chest pain and orthopnea.  Gastrointestinal:  Negative for constipation and diarrhea.  Genitourinary:  Negative for dysuria, frequency and urgency.  Neurological:  Negative for dizziness, loss of consciousness, weakness and headaches.    Physical Exam:  Vitals:   04/19/21 0946  BP: 117/71  Pulse: 69  Temp: 98.6 F (37 C)  TempSrc: Oral  SpO2: 97%  Weight: 245 lb 11.2 oz (111.4 kg)  Height: 6' (1.829 m)   Physical Exam Constitutional:      Appearance: Normal appearance.  Cardiovascular:     Rate and Rhythm: Normal rate and regular rhythm.     Pulses:          Carotid pulses are 2+ on the right side and 2+ on the left side.      Posterior tibial pulses are 2+ on the right side and 2+ on the left side.     Heart sounds: Normal heart sounds.  Pulmonary:     Effort:  Pulmonary effort is normal.     Breath sounds: Normal breath sounds.  Abdominal:     Tenderness: There is no abdominal tenderness.  Musculoskeletal:     Right lower leg: 1+ Pitting Edema present.     Left lower leg: 1+ Pitting Edema present.  Skin:    General: Skin is warm and dry.     Nails: There is no clubbing.  Neurological:     Mental Status: He is alert.     Assessment & Plan:   See Encounters Tab for problem based charting.  Patient seen with Dr. Jimmye Norman

## 2021-04-19 NOTE — Patient Instructions (Addendum)
Thank you for visiting the Internal Medicine Clinic today. It was a pleasure to meet you!  Today we followed up on your recent emergency room visit where you were admitted for acute kidney injury. I am so happy to hear that you feel the best that you have in a while today!  Continue to check your weight daily and let us know if you notice a 3 or more pound weight gain. If you start feeling poorly again, please let us know.Please continue to stay hydrated, especially on these hot days when you will be working in the elements. I'd like you to continue not taking your lasix, chlorthalidone, and spironolactone. I'd like you to continue taking Entresto at half the dose as well as your carvedilol.   If you are unable to establish with the primary care office that you have been speaking with, please let us know as we would be happy to be your primary care providers!  If you have any questions or concerns, please call our clinic at [phone] between 9am-5pm and after hours call [phone] and ask for the internal medicine resident on call. If you feel you are having a medical emergency please call 911.  Dr. Marlou Sa

## 2021-04-19 NOTE — Assessment & Plan Note (Signed)
Patient returned to normal kidney function on discharge as shown by labs. He reports maintaining adequate hydration, avoiding excessive heat, and maintaining medication changes as advised on discharge. He reports feeling very well today.  - Follow-up with primary care or establish primary care with [facility] in 3 months.

## 2021-05-05 ENCOUNTER — Encounter (HOSPITAL_COMMUNITY): Payer: Self-pay | Admitting: *Deleted

## 2021-05-05 ENCOUNTER — Telehealth: Payer: Self-pay

## 2021-05-05 ENCOUNTER — Other Ambulatory Visit: Payer: Self-pay

## 2021-05-05 ENCOUNTER — Ambulatory Visit (HOSPITAL_COMMUNITY)
Admission: EM | Admit: 2021-05-05 | Discharge: 2021-05-05 | Disposition: A | Discharge status: Home / Self Care | Payer: PPO | Attending: Physician Assistant | Admitting: Physician Assistant

## 2021-05-05 DIAGNOSIS — M62838 Other muscle spasm: Secondary | ICD-10-CM

## 2021-05-05 MED ORDER — PREDNISONE 10 MG (21) PO TBPK: ORAL_TABLET | Freq: Every day | ORAL | 0 refills | Status: DC
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
## 2021-05-05 NOTE — Discharge Instructions (Addendum)
Take prednisone as prescribed Can take tylenol as needed for pain Recommend alternating ice/heat to affected areas Recommend light stretching.  Return if you develop new or worsening symptoms.

## 2021-05-05 NOTE — Telephone Encounter (Signed)
Received TC from patient who is asking for an appt this afternoon, he states he established care at [facility]. He c/o "severe pain in his right lower back that he woke up with this morning".  He states it is a "20 on the pain scale".  Pt also c/o intermittent nausea and RN notes SOB during conversation.  Pt states he is always SOB d/t COPD.  He denies any add'l symptoms. During conversation, patient screams out in pain X2.  RN advised patient to present to ED for evaluation.  He states he doesn't want to wait in the ED.  He was informed if he is in this much pain/distress, he needs to be evaluated emergently.  He was again advised to go to the ED or [facility] urgent care.  He states he will present to urgent care.  (Will also forward to office manager for PCP assignment) Laurence Compton, RN,BSN

## 2021-05-07 ENCOUNTER — Other Ambulatory Visit: Payer: Self-pay | Admitting: *Deleted

## 2021-05-10 MED ORDER — PANTOPRAZOLE SODIUM 40 MG PO TBEC: DELAYED_RELEASE_TABLET | ORAL | 3 refills | Status: DC

## 2021-05-18 NOTE — Progress Notes (Signed)
Internal Medicine Clinic Attending  I saw and evaluated the patient.  I personally confirmed the key portions of the history and exam documented by Arthur [name] and I reviewed pertinent patient test results.  The assessment, diagnosis, and plan were formulated together and I agree with the documentation in the resident's note. Arthur [name] was intended according to DC summary to have BMP drawn and orthostatics checked this visit.  A number of his usual diuretic and hypertensive medicines were held or modified.

## 2021-05-21 ENCOUNTER — Other Ambulatory Visit: Payer: Self-pay | Admitting: Family Medicine

## 2021-05-21 DIAGNOSIS — E039 Hypothyroidism, unspecified: Secondary | ICD-10-CM
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
## 2021-05-27 ENCOUNTER — Ambulatory Visit: Payer: PPO | Admitting: Cardiovascular Disease

## 2021-05-27 ENCOUNTER — Encounter: Payer: Self-pay | Admitting: Cardiovascular Disease

## 2021-05-27 ENCOUNTER — Encounter (INDEPENDENT_AMBULATORY_CARE_PROVIDER_SITE_OTHER): Payer: Self-pay

## 2021-05-27 ENCOUNTER — Other Ambulatory Visit: Payer: Self-pay

## 2021-05-27 VITALS — BP 100/72 | HR 70 | Ht 72.0 in | Wt 240.0 lb

## 2021-05-27 DIAGNOSIS — I420 Dilated cardiomyopathy: Secondary | ICD-10-CM

## 2021-05-27 DIAGNOSIS — R55 Syncope and collapse: Secondary | ICD-10-CM

## 2021-05-27 DIAGNOSIS — E782 Mixed hyperlipidemia: Secondary | ICD-10-CM

## 2021-05-27 DIAGNOSIS — J449 Chronic obstructive pulmonary disease, unspecified: Secondary | ICD-10-CM | POA: Diagnosis not present

## 2021-05-27 NOTE — Patient Instructions (Signed)
Medication Instructions:  *If you need a refill on your cardiac medications before your next appointment, please call your pharmacy*  Lab Work: If you have labs (blood work) drawn today and your tests are completely normal, you will receive your results only by: MyChart Message (if you have MyChart) OR A paper copy in the mail If you have any lab test that is abnormal or we need to change your treatment, we will call you to review the results.  Testing/Procedures: None ordered today.  Follow-Up: At [facility], you and your health needs are our priority.  As part of our continuing mission to provide you with exceptional heart care, we have created designated Provider Care Teams.  These Care Teams include your primary Cardiologist (physician) and Advanced Practice Providers (APPs -  Physician Assistants and Nurse Practitioners) who all work together to provide you with the care you need, when you need it.  We recommend signing up for the patient portal called "MyChart".  Sign up information is provided on this After Visit Summary.  MyChart is used to connect with patients for Virtual Visits (Telemedicine).  Patients are able to view lab/test results, encounter notes, upcoming appointments, etc.  Non-urgent messages can be sent to your provider as well.   To learn more about what you can do with MyChart, go to [email or web address].    Your next appointment:   6 month(s)  The format for your next appointment:   In Person  Provider:   You may see Peter Nishan, MD or one of the following Advanced Practice Providers on your designated Care Team:   Laura Ingold, NP  

## 2021-06-01 ENCOUNTER — Other Ambulatory Visit: Payer: Self-pay | Admitting: Pulmonary Disease

## 2021-06-14 ENCOUNTER — Ambulatory Visit: Payer: PPO | Admitting: Cardiovascular Disease

## 2021-06-22 ENCOUNTER — Encounter: Payer: Self-pay | Admitting: Pulmonary Disease

## 2021-06-22 ENCOUNTER — Ambulatory Visit: Payer: PPO | Admitting: Pulmonary Disease

## 2021-06-22 ENCOUNTER — Other Ambulatory Visit: Payer: Self-pay

## 2021-06-22 DIAGNOSIS — R918 Other nonspecific abnormal finding of lung field: Secondary | ICD-10-CM | POA: Diagnosis not present

## 2021-06-22 DIAGNOSIS — Z23 Encounter for immunization: Secondary | ICD-10-CM | POA: Diagnosis not present

## 2021-06-22 DIAGNOSIS — J432 Centrilobular emphysema: Secondary | ICD-10-CM

## 2021-06-22 NOTE — Assessment & Plan Note (Addendum)
Continue Trelegy. Smoking cessation again advised is the most important intervention Flu shot today

## 2021-06-22 NOTE — Patient Instructions (Signed)
Continue Trelegy. Schedule CT chest without contrast to follow-up on nodule. Flu shot today

## 2021-06-22 NOTE — Assessment & Plan Note (Signed)
6 mm nodule noted on CT from 03/2020. We will schedule I year follow-up CT chest without contrast
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
## 2021-06-28 ENCOUNTER — Other Ambulatory Visit: Payer: Self-pay | Admitting: Family Medicine

## 2021-06-28 ENCOUNTER — Other Ambulatory Visit: Payer: Self-pay

## 2021-06-28 ENCOUNTER — Ambulatory Visit (INDEPENDENT_AMBULATORY_CARE_PROVIDER_SITE_OTHER)
Admission: RE | Admit: 2021-06-28 | Discharge: 2021-06-28 | Disposition: A | Discharge status: Home / Self Care | Payer: PPO | Source: Ambulatory Visit | Attending: Pulmonary Disease | Admitting: Pulmonary Disease

## 2021-06-28 DIAGNOSIS — J432 Centrilobular emphysema: Secondary | ICD-10-CM

## 2021-06-28 DIAGNOSIS — I700 Atherosclerosis of aorta: Secondary | ICD-10-CM | POA: Diagnosis not present

## 2021-06-28 DIAGNOSIS — R918 Other nonspecific abnormal finding of lung field: Secondary | ICD-10-CM

## 2021-06-28 DIAGNOSIS — R911 Solitary pulmonary nodule: Secondary | ICD-10-CM | POA: Diagnosis not present

## 2021-06-28 DIAGNOSIS — E039 Hypothyroidism, unspecified: Secondary | ICD-10-CM
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
## 2021-07-12 ENCOUNTER — Other Ambulatory Visit: Payer: Self-pay

## 2021-07-12 ENCOUNTER — Ambulatory Visit (INDEPENDENT_AMBULATORY_CARE_PROVIDER_SITE_OTHER): Payer: PPO | Admitting: Nurse Practitioner

## 2021-07-12 ENCOUNTER — Encounter: Payer: Self-pay | Admitting: Nurse Practitioner

## 2021-07-12 VITALS — BP 138/70 | HR 67 | Temp 98.7°F | Ht 72.0 in | Wt 246.0 lb

## 2021-07-12 DIAGNOSIS — E039 Hypothyroidism, unspecified: Secondary | ICD-10-CM | POA: Diagnosis not present

## 2021-07-12 DIAGNOSIS — I10 Essential (primary) hypertension: Secondary | ICD-10-CM

## 2021-07-12 DIAGNOSIS — I700 Atherosclerosis of aorta: Secondary | ICD-10-CM

## 2021-07-12 DIAGNOSIS — Z7689 Persons encountering health services in other specified circumstances: Secondary | ICD-10-CM | POA: Diagnosis not present

## 2021-07-12 DIAGNOSIS — Z85850 Personal history of malignant neoplasm of thyroid: Secondary | ICD-10-CM | POA: Diagnosis not present

## 2021-07-12 DIAGNOSIS — J449 Chronic obstructive pulmonary disease, unspecified: Secondary | ICD-10-CM

## 2021-07-12 DIAGNOSIS — Z8546 Personal history of malignant neoplasm of prostate: Secondary | ICD-10-CM

## 2021-07-12 DIAGNOSIS — E785 Hyperlipidemia, unspecified: Secondary | ICD-10-CM | POA: Diagnosis not present

## 2021-07-12 NOTE — Assessment & Plan Note (Addendum)
Chronic.  Secondary to thyroid cancer.  Recheck TSH with free T4.  Plan to continue levothyroxine 200 mcg daily as long as TSH stable.  Follow up 6 months.

## 2021-07-13 LAB — COMPLETE METABOLIC PANEL WITH GFR
AG Ratio: 1.5 (calc) (ref 1.0–2.5)
ALT: 15 U/L (ref 9–46)
AST: 13 U/L (ref 10–35)
Albumin: 4 g/dL (ref 3.6–5.1)
Alkaline phosphatase (APISO): 88 U/L (ref 35–144)
BUN: 19 mg/dL (ref 7–25)
CO2: 32 mmol/L (ref 20–32)
Calcium: 9.2 mg/dL (ref 8.6–10.3)
Chloride: 104 mmol/L (ref 98–110)
Creat: 1.1 mg/dL (ref 0.70–1.28)
Globulin: 2.6 g/dL (calc) (ref 1.9–3.7)
Glucose, Bld: 106 mg/dL — ABNORMAL HIGH (ref 65–99)
Potassium: 4 mmol/L (ref 3.5–5.3)
Sodium: 142 mmol/L (ref 135–146)
Total Bilirubin: 0.6 mg/dL (ref 0.2–1.2)
Total Protein: 6.6 g/dL (ref 6.1–8.1)
eGFR: 69 mL/min/{1.73_m2} (ref >=60)

## 2021-07-13 LAB — CBC WITH DIFFERENTIAL/PLATELET
Absolute Monocytes: 616 cells/uL (ref 200–950)
Basophils Absolute: 31 cells/uL (ref 0–200)
Basophils Relative: 0.4 %
Eosinophils Absolute: 400 cells/uL (ref 15–500)
Eosinophils Relative: 5.2 %
HCT: 38 % — ABNORMAL LOW (ref 38.5–50.0)
Hemoglobin: 12.6 g/dL — ABNORMAL LOW (ref 13.2–17.1)
Lymphs Abs: 2141 cells/uL (ref 850–3900)
MCH: 29.9 pg (ref 27.0–33.0)
MCHC: 33.2 g/dL (ref 32.0–36.0)
MCV: 90.3 fL (ref 80.0–100.0)
MPV: 11.6 fL (ref 7.5–12.5)
Monocytes Relative: 8 %
Neutro Abs: 4512 cells/uL (ref 1500–7800)
Neutrophils Relative %: 58.6 %
Platelets: 218 10*3/uL (ref 140–400)
RBC: 4.21 10*6/uL (ref 4.20–5.80)
RDW: 12 % (ref 11.0–15.0)
Total Lymphocyte: 27.8 %
WBC: 7.7 10*3/uL (ref 3.8–10.8)

## 2021-07-13 LAB — T4, FREE: Free T4: 1.5 ng/dL (ref 0.8–1.8)

## 2021-07-13 LAB — LIPID PANEL
Cholesterol: 122 mg/dL (ref <200)
HDL: 35 mg/dL — ABNORMAL LOW (ref >=40)
LDL Cholesterol (Calc): 65 mg/dL (calc)
Non-HDL Cholesterol (Calc): 87 mg/dL (calc) (ref <130)
Total CHOL/HDL Ratio: 3.5 (calc) (ref <5.0)
Triglycerides: 140 mg/dL (ref <150)

## 2021-07-13 LAB — PSA: PSA: 0.04 ng/mL (ref <=4.00)

## 2021-07-13 LAB — TSH: TSH: 1.74 mIU/L (ref 0.40–4.50)

## 2021-07-14 ENCOUNTER — Encounter: Payer: Self-pay | Admitting: Nurse Practitioner

## 2021-07-14 ENCOUNTER — Other Ambulatory Visit: Payer: Self-pay | Admitting: Nurse Practitioner

## 2021-07-14 DIAGNOSIS — E039 Hypothyroidism, unspecified: Secondary | ICD-10-CM

## 2021-07-14 MED ORDER — LEVOTHYROXINE SODIUM 200 MCG PO TABS: 200.0000 ug | ORAL_TABLET | Freq: Every day | ORAL | 1 refills | Status: DC

## 2021-07-16 ENCOUNTER — Encounter: Payer: Self-pay | Admitting: Nurse Practitioner

## 2021-07-16 DIAGNOSIS — Z85850 Personal history of malignant neoplasm of thyroid: Secondary | ICD-10-CM | POA: Insufficient documentation

## 2021-07-16 DIAGNOSIS — Z8546 Personal history of malignant neoplasm of prostate: Secondary | ICD-10-CM | POA: Insufficient documentation

## 2021-07-16 NOTE — Assessment & Plan Note (Addendum)
Chronic.  Will check lipids today, patient is fasting.  Continue atorvastatin 40 mg daily for now.  Check liver function today.  Follow up in 6 months.

## 2021-07-16 NOTE — Assessment & Plan Note (Addendum)
Chronic.  BP is elevated above goal in clinic.  Previously, he was well controlled on this regimen.  It also sounds like in the past, he was taking Lasix, chlorthalidone, and/or spironolactone.  We can consider resuming one of these medications if BP remains elevated.  I've asked the patient to check his blood pressure at home and notify me with the readings in 2 weeks.  Will check CBC and kidney function with electrolytes today.  Continue collaboration with Cardiology.  Follow up in 6 months.

## 2021-07-16 NOTE — Assessment & Plan Note (Signed)
Chronic.  Follows with Pulmonology.  Stable with Trelegy. Continue collaboration.

## 2021-08-25 ENCOUNTER — Other Ambulatory Visit: Payer: Self-pay | Admitting: Pulmonary Disease

## 2021-09-07 DIAGNOSIS — J449 Chronic obstructive pulmonary disease, unspecified: Secondary | ICD-10-CM | POA: Diagnosis not present

## 2021-09-07 DIAGNOSIS — I509 Heart failure, unspecified: Secondary | ICD-10-CM | POA: Diagnosis not present

## 2021-09-07 DIAGNOSIS — Z7951 Long term (current) use of inhaled steroids: Secondary | ICD-10-CM | POA: Diagnosis not present

## 2021-09-07 DIAGNOSIS — I110 Hypertensive heart disease with heart failure: Secondary | ICD-10-CM | POA: Diagnosis not present

## 2021-09-07 DIAGNOSIS — F17210 Nicotine dependence, cigarettes, uncomplicated: Secondary | ICD-10-CM | POA: Diagnosis not present

## 2021-09-07 DIAGNOSIS — Z6832 Body mass index (BMI) 32.0-32.9, adult: Secondary | ICD-10-CM | POA: Diagnosis not present

## 2021-09-07 DIAGNOSIS — F330 Major depressive disorder, recurrent, mild: Secondary | ICD-10-CM | POA: Diagnosis not present
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
## 2021-11-29 ENCOUNTER — Other Ambulatory Visit: Payer: Self-pay

## 2021-11-29 ENCOUNTER — Ambulatory Visit: Payer: PPO | Admitting: Cardiovascular Disease

## 2021-11-29 VITALS — BP 150/78 | HR 66 | Ht 72.0 in | Wt 245.0 lb

## 2021-11-29 DIAGNOSIS — I10 Essential (primary) hypertension: Secondary | ICD-10-CM | POA: Diagnosis not present

## 2021-11-29 DIAGNOSIS — I420 Dilated cardiomyopathy: Secondary | ICD-10-CM

## 2021-11-29 DIAGNOSIS — E782 Mixed hyperlipidemia: Secondary | ICD-10-CM | POA: Diagnosis not present

## 2021-11-29 NOTE — Patient Instructions (Signed)
Medication Instructions:  ?*If you need a refill on your cardiac medications before your next appointment, please call your pharmacy* ? ?Lab Work: ?If you have labs (blood work) drawn today and your tests are completely normal, you will receive your results only by: ?MyChart Message (if you have MyChart) OR ?A paper copy in the mail ?If you have any lab test that is abnormal or we need to change your treatment, we will call you to review the results. ? ?Follow-Up: ?At [facility], you and your health needs are our priority.  As part of our continuing mission to provide you with exceptional heart care, we have created designated Provider Care Teams.  These Care Teams include your primary Cardiologist (physician) and Advanced Practice Providers (APPs -  Physician Assistants and Nurse Practitioners) who all work together to provide you with the care you need, when you need it. ? ?We recommend signing up for the patient portal called "MyChart".  Sign up information is provided on this After Visit Summary.  MyChart is used to connect with patients for Virtual Visits (Telemedicine).  Patients are able to view lab/test results, encounter notes, upcoming appointments, etc.  Non-urgent messages can be sent to your provider as well.   ?To learn more about what you can do with MyChart, go to [email or web address].   ? ?Your next appointment:   ?1 year(s) ? ?The format for your next appointment:   ?In Person ? ?Provider:   ?Peter Nishan, MD { ? ?

## 2021-12-03 DIAGNOSIS — I110 Hypertensive heart disease with heart failure: Secondary | ICD-10-CM | POA: Diagnosis not present

## 2021-12-03 DIAGNOSIS — Z7951 Long term (current) use of inhaled steroids: Secondary | ICD-10-CM | POA: Diagnosis not present

## 2021-12-03 DIAGNOSIS — I509 Heart failure, unspecified: Secondary | ICD-10-CM | POA: Diagnosis not present

## 2021-12-03 DIAGNOSIS — Z634 Disappearance and death of family member: Secondary | ICD-10-CM | POA: Diagnosis not present

## 2021-12-03 DIAGNOSIS — F331 Major depressive disorder, recurrent, moderate: Secondary | ICD-10-CM | POA: Diagnosis not present

## 2021-12-03 DIAGNOSIS — G4700 Insomnia, unspecified: Secondary | ICD-10-CM | POA: Diagnosis not present

## 2021-12-03 DIAGNOSIS — Z87891 Personal history of nicotine dependence: Secondary | ICD-10-CM | POA: Diagnosis not present

## 2021-12-03 DIAGNOSIS — Z6832 Body mass index (BMI) 32.0-32.9, adult: Secondary | ICD-10-CM | POA: Diagnosis not present

## 2021-12-03 DIAGNOSIS — I700 Atherosclerosis of aorta: Secondary | ICD-10-CM | POA: Diagnosis not present

## 2021-12-03 DIAGNOSIS — D692 Other nonthrombocytopenic purpura: Secondary | ICD-10-CM | POA: Diagnosis not present

## 2021-12-03 DIAGNOSIS — J449 Chronic obstructive pulmonary disease, unspecified: Secondary | ICD-10-CM | POA: Diagnosis not present

## 2021-12-06 ENCOUNTER — Other Ambulatory Visit: Payer: Self-pay | Admitting: Cardiovascular Disease

## 2021-12-14 ENCOUNTER — Encounter: Payer: Self-pay | Admitting: Pulmonary Disease

## 2021-12-14 ENCOUNTER — Other Ambulatory Visit: Payer: Self-pay

## 2021-12-14 ENCOUNTER — Ambulatory Visit: Payer: PPO | Admitting: Pulmonary Disease

## 2021-12-14 DIAGNOSIS — J432 Centrilobular emphysema: Secondary | ICD-10-CM

## 2021-12-14 DIAGNOSIS — R918 Other nonspecific abnormal finding of lung field: Secondary | ICD-10-CM | POA: Diagnosis not present

## 2021-12-14 MED ORDER — PREDNISONE 10 MG PO TABS: 10.0000 mg | ORAL_TABLET | Freq: Every day | ORAL | 0 refills | Status: DC

## 2021-12-14 MED ORDER — ALBUTEROL SULFATE HFA 108 (90 BASE) MCG/ACT IN AERS: 1.0000 | INHALATION_SPRAY | Freq: Four times a day (QID) | RESPIRATORY_TRACT | 3 refills | Status: DC | PRN

## 2021-12-14 NOTE — Assessment & Plan Note (Addendum)
Reviewed last CT from September 2022 which is stable ?Right lower lobe nodule appears to be benign and stable for many years ?He will turn 81 this year and we can probably stop surveillance ?

## 2021-12-14 NOTE — Assessment & Plan Note (Signed)
He has mild bronchospasm and clear sputum, does not appear to be an infective exacerbation but could be related to allergies. ?We will treat with a short course of prednisone 20 mg for 5 days followed by 10 mg for 5 days ? ?He will continue on Trelegy. ?Refills will be provided on his albuterol ?

## 2021-12-14 NOTE — Patient Instructions (Signed)
?  X  Prednisone 10 mg tabs  Take 2 tabs daily with food x 5ds, then 1 tab daily with food x 5ds then STOP ? ? ?Refills on albuterol ? ?

## 2021-12-14 NOTE — Progress Notes (Signed)
? ?  Subjective:  ? ? Patient ID: Arthur [name]., male    DOB: 1940/10/21, 81 y.o.   MRN: [id number] ? ?HPI ? ?81 yo smoker , followed for COPD and stable pulmonary nodules ?PMH -HFrEF (EF 35 % ) , has recovered ?He races modified cars as a hobby ? ?3-monthfollow-up visit. ?He quit smoking completely 2 months ago, previously would smoke 1 to 2 cigarettes daily.  He had chest congestion few weeks ago and this is improved but still has some residual ?Reviewed cardiology consultation, remains on Entresto, EF has recovered, occasionally has pedal edema but off diuretic ?Compliant with Trelegy. ?He has an active lifestyle, races cars, works for the form, goes dancing every weekend ? ? ?Significant tests/ events reviewed ? ?2D Echo 05/2016 Moderate to severe global reduction in LV function; mild LVH; ?  grade 1 diastolic dysfunction with elevated LV filling pressure; ?  mild biatrial enlargement; trace AI, MR and TR. EF 30-35% ?  ?3/2022EF improved to 52%  ?  ?Spirometry 2016 >FEV1 66% , ratio 63.  ?  ?CT chest wo con 06/2021 >> stable RLL nodule ?CT angiogram 12/2019 shows stable right lower lobe 6 mm nodule ?Ldct 08/2019 stable nodules ?CT chest 09/2015-nodule stable or decreased compared to 02/2015 ? ? ? ?Review of Systems ?neg for any significant sore throat, dysphagia, itching, sneezing, nasal congestion or excess/ purulent secretions, fever, chills, sweats, unintended wt loss, pleuritic or exertional cp, hempoptysis, orthopnea pnd or change in chronic leg swelling. Also denies presyncope, palpitations, heartburn, abdominal pain, nausea, vomiting, diarrhea or change in bowel or urinary habits, dysuria,hematuria, rash, arthralgias, visual complaints, headache, numbness weakness or ataxia. ? ?   ?Objective:  ? Physical Exam ? ?Gen. Pleasant, obese, in no distress, normal affect ?ENT - no pallor,icterus, no post nasal drip, class 2-3 airway ?Neck: No JVD, no thyromegaly, no carotid bruits ?Lungs: no use of accessory  muscles, no dullness to percussion, faint scattered  rhonchi  ?Cardiovascular: Rhythm regular, heart sounds  normal, no murmurs or gallops, no peripheral edema ?Abdomen: soft and non-tender, no hepatosplenomegaly, BS normal. ?Musculoskeletal: No deformities, no cyanosis or clubbing ?Neuro:  alert, non focal, no tremors ? ? ? ?   ?Assessment & Plan:  ? ? ?

## 2021-12-22 ENCOUNTER — Encounter: Payer: Self-pay | Admitting: Pulmonary Disease

## 2021-12-22 ENCOUNTER — Telehealth: Payer: Self-pay | Admitting: Pulmonary Disease

## 2021-12-22 ENCOUNTER — Ambulatory Visit (INDEPENDENT_AMBULATORY_CARE_PROVIDER_SITE_OTHER): Payer: PPO

## 2021-12-22 ENCOUNTER — Ambulatory Visit (INDEPENDENT_AMBULATORY_CARE_PROVIDER_SITE_OTHER): Payer: PPO | Admitting: Pulmonary Disease

## 2021-12-22 ENCOUNTER — Other Ambulatory Visit: Payer: Self-pay

## 2021-12-22 VITALS — BP 150/72 | HR 70 | Temp 98.8°F | Ht 72.0 in | Wt 246.0 lb

## 2021-12-22 DIAGNOSIS — J432 Centrilobular emphysema: Secondary | ICD-10-CM

## 2021-12-22 DIAGNOSIS — R059 Cough, unspecified: Secondary | ICD-10-CM | POA: Diagnosis not present

## 2021-12-22 DIAGNOSIS — R0989 Other specified symptoms and signs involving the circulatory and respiratory systems: Secondary | ICD-10-CM | POA: Diagnosis not present

## 2021-12-22 MED ORDER — AZITHROMYCIN 250 MG PO TABS
250.0000 mg | ORAL_TABLET | Freq: Every day | ORAL | 0 refills | Status: DC
Start: 2021-12-22 — End: 2022-01-02

## 2021-12-22 MED ORDER — PREDNISONE 10 MG PO TABS
ORAL_TABLET | ORAL | 0 refills | Status: DC
Start: 2021-12-22 — End: 2021-12-27

## 2021-12-22 NOTE — Patient Instructions (Signed)
?  X Solumedrol  80 mg x 1 ? ?Prednisone 10 mg tabs Take 4 tabs  daily with food x 4 days, then 3 tabs daily x 4 days, then 2 tabs daily x 4 days, then 1 tab daily x4 days then stop. #40 ? ?CXR today ? ?Z-pak ? ?X Rx for nebuliser  ?Albuterol nebs every 6h as needed # 30 x 1 refill ?

## 2021-12-22 NOTE — Telephone Encounter (Signed)
Called [facility] and clarified the order for Prednisone. Nothing further needed  ?

## 2021-12-22 NOTE — Assessment & Plan Note (Signed)
We will treat as exacerbation with steroids and antibiotic ? ?Solumedrol  80 mg x 1 ? ?Prednisone 10 mg tabs Take 4 tabs  daily with food x 4 days, then 3 tabs daily x 4 days, then 2 tabs daily x 4 days, then 1 tab daily x4 days then stop. #40 ? ?CXR -obtained and reviewed which does not show any evidence of infiltrate or pneumonia ?Z-pak ? ?X Rx for nebuliser  ?Albuterol nebs every 6h as needed # 30 x 1 refill ?

## 2021-12-22 NOTE — Progress Notes (Signed)
? ?  Subjective:  ? ? Patient ID: Arthur [name]., male    DOB: July 07, 1941, 81 y.o.   MRN: [id number] ? ?HPI ? ?81 yo smoker , followed for COPD and stable pulmonary nodules ?PMH -HFrEF (EF 35 % ) , has recovered ?He races modified cars as a hobby ? ? ?Chief Complaint  ?Patient presents with  ? Acute Visit  ?  Cough is getting worse. Mucus is gray to white and very thick. Pt states that this has been getting worse since last office visit. Chest congestion noted as well   ? ? ?Last OV, we gave him a short course of prednisone.  He was complaining of chest congestion and wheezing, no sick contacts ?He returns today stating that congestion is worse, he is bringing up gray sputum, has increased wheezing and has been mostly inactive at home.  He did not even go dancing! ?Again no sick contacts, no fevers has not tested for COVID ? ? ?Significant tests/ events reviewed ?2D Echo 05/2016 Moderate to severe global reduction in LV function; mild LVH; ?  grade 1 diastolic dysfunction with elevated LV filling pressure; ?  mild biatrial enlargement; trace AI, MR and TR. EF 30-35% ?  ?3/2022EF improved to 52%  ?  ?Spirometry 2016 >FEV1 66% , ratio 63.  ?  ?CT chest wo con 06/2021 >> stable RLL nodule ?CT angiogram 12/2019 shows stable right lower lobe 6 mm nodule ?Ldct 08/2019 stable nodules ?CT chest 09/2015-nodule stable or decreased compared to 02/2015 ? ? ?Review of Systems ?neg for any significant sore throat, dysphagia, itching, sneezing, nasal congestion or excess/ purulent secretions, fever, chills, sweats, unintended wt loss, pleuritic or exertional cp, hempoptysis, orthopnea pnd or change in chronic leg swelling. Also denies presyncope, palpitations, heartburn, abdominal pain, nausea, vomiting, diarrhea or change in bowel or urinary habits, dysuria,hematuria, rash, arthralgias, visual complaints, headache, numbness weakness or ataxia. ? ?   ?Objective:  ? Physical Exam ? ?Gen. Pleasant, obese, in no distress ?ENT - no  lesions, no post nasal drip ?Neck: No JVD, no thyromegaly, no carotid bruits ?Lungs: no use of accessory muscles, no dullness to percussion, BL scattered rhonchi ?Cardiovascular: Rhythm regular, heart sounds  normal, no murmurs or gallops, no peripheral edema ?Musculoskeletal: No deformities, no cyanosis or clubbing , no tremors ? ? ? ?   ?Assessment & Plan:  ? ? ?

## 2021-12-24 DIAGNOSIS — J432 Centrilobular emphysema: Secondary | ICD-10-CM | POA: Diagnosis not present

## 2021-12-27 ENCOUNTER — Observation Stay (HOSPITAL_COMMUNITY): Payer: PPO

## 2021-12-27 ENCOUNTER — Inpatient Hospital Stay (HOSPITAL_COMMUNITY)
Admission: EM | Admit: 2021-12-27 | Discharge: 2022-01-02 | DRG: 190 | Disposition: A | Discharge status: Home / Self Care | Payer: PPO | Attending: Internal Medicine | Admitting: Internal Medicine

## 2021-12-27 ENCOUNTER — Telehealth: Payer: Self-pay | Admitting: Pulmonary Disease

## 2021-12-27 ENCOUNTER — Other Ambulatory Visit: Payer: Self-pay

## 2021-12-27 ENCOUNTER — Emergency Department (HOSPITAL_COMMUNITY): Payer: PPO

## 2021-12-27 ENCOUNTER — Encounter (HOSPITAL_COMMUNITY): Payer: Self-pay | Admitting: Emergency Medicine

## 2021-12-27 DIAGNOSIS — Z808 Family history of malignant neoplasm of other organs or systems: Secondary | ICD-10-CM | POA: Diagnosis not present

## 2021-12-27 DIAGNOSIS — R059 Cough, unspecified: Secondary | ICD-10-CM | POA: Diagnosis not present

## 2021-12-27 DIAGNOSIS — R918 Other nonspecific abnormal finding of lung field: Secondary | ICD-10-CM | POA: Diagnosis not present

## 2021-12-27 DIAGNOSIS — T380X5A Adverse effect of glucocorticoids and synthetic analogues, initial encounter: Secondary | ICD-10-CM | POA: Diagnosis present

## 2021-12-27 DIAGNOSIS — Z881 Allergy status to other antibiotic agents status: Secondary | ICD-10-CM | POA: Diagnosis not present

## 2021-12-27 DIAGNOSIS — Z833 Family history of diabetes mellitus: Secondary | ICD-10-CM

## 2021-12-27 DIAGNOSIS — Z7951 Long term (current) use of inhaled steroids: Secondary | ICD-10-CM | POA: Diagnosis not present

## 2021-12-27 DIAGNOSIS — Z79899 Other long term (current) drug therapy: Secondary | ICD-10-CM | POA: Diagnosis not present

## 2021-12-27 DIAGNOSIS — I452 Bifascicular block: Secondary | ICD-10-CM | POA: Diagnosis not present

## 2021-12-27 DIAGNOSIS — R079 Chest pain, unspecified: Secondary | ICD-10-CM | POA: Diagnosis not present

## 2021-12-27 DIAGNOSIS — Z8546 Personal history of malignant neoplasm of prostate: Secondary | ICD-10-CM | POA: Diagnosis not present

## 2021-12-27 DIAGNOSIS — Z85850 Personal history of malignant neoplasm of thyroid: Secondary | ICD-10-CM

## 2021-12-27 DIAGNOSIS — K219 Gastro-esophageal reflux disease without esophagitis: Secondary | ICD-10-CM | POA: Diagnosis not present

## 2021-12-27 DIAGNOSIS — I5032 Chronic diastolic (congestive) heart failure: Secondary | ICD-10-CM | POA: Diagnosis not present

## 2021-12-27 DIAGNOSIS — Z800 Family history of malignant neoplasm of digestive organs: Secondary | ICD-10-CM

## 2021-12-27 DIAGNOSIS — E7800 Pure hypercholesterolemia, unspecified: Secondary | ICD-10-CM | POA: Diagnosis present

## 2021-12-27 DIAGNOSIS — J441 Chronic obstructive pulmonary disease with (acute) exacerbation: Secondary | ICD-10-CM | POA: Diagnosis present

## 2021-12-27 DIAGNOSIS — I5022 Chronic systolic (congestive) heart failure: Secondary | ICD-10-CM | POA: Diagnosis not present

## 2021-12-27 DIAGNOSIS — I428 Other cardiomyopathies: Secondary | ICD-10-CM | POA: Diagnosis not present

## 2021-12-27 DIAGNOSIS — I110 Hypertensive heart disease with heart failure: Secondary | ICD-10-CM | POA: Diagnosis present

## 2021-12-27 DIAGNOSIS — Z79890 Hormone replacement therapy (postmenopausal): Secondary | ICD-10-CM

## 2021-12-27 DIAGNOSIS — R0602 Shortness of breath: Secondary | ICD-10-CM | POA: Diagnosis not present

## 2021-12-27 DIAGNOSIS — Z20822 Contact with and (suspected) exposure to covid-19: Secondary | ICD-10-CM | POA: Diagnosis present

## 2021-12-27 DIAGNOSIS — J439 Emphysema, unspecified: Secondary | ICD-10-CM | POA: Diagnosis not present

## 2021-12-27 DIAGNOSIS — I10 Essential (primary) hypertension: Secondary | ICD-10-CM | POA: Diagnosis present

## 2021-12-27 DIAGNOSIS — Z87891 Personal history of nicotine dependence: Secondary | ICD-10-CM

## 2021-12-27 DIAGNOSIS — J9621 Acute and chronic respiratory failure with hypoxia: Secondary | ICD-10-CM | POA: Diagnosis not present

## 2021-12-27 DIAGNOSIS — R0789 Other chest pain: Secondary | ICD-10-CM | POA: Diagnosis not present

## 2021-12-27 DIAGNOSIS — E039 Hypothyroidism, unspecified: Secondary | ICD-10-CM | POA: Diagnosis present

## 2021-12-27 DIAGNOSIS — Z8249 Family history of ischemic heart disease and other diseases of the circulatory system: Secondary | ICD-10-CM

## 2021-12-27 DIAGNOSIS — I5042 Chronic combined systolic (congestive) and diastolic (congestive) heart failure: Secondary | ICD-10-CM | POA: Diagnosis not present

## 2021-12-27 DIAGNOSIS — J449 Chronic obstructive pulmonary disease, unspecified: Secondary | ICD-10-CM | POA: Diagnosis not present

## 2021-12-27 LAB — CBC WITH DIFFERENTIAL/PLATELET
Abs Immature Granulocytes: 0.13 10*3/uL — ABNORMAL HIGH (ref 0.00–0.07)
Basophils Absolute: 0 10*3/uL (ref 0.0–0.1)
Basophils Relative: 0 %
Eosinophils Absolute: 0 10*3/uL (ref 0.0–0.5)
Eosinophils Relative: 0 %
HCT: 42.3 % (ref 39.0–52.0)
Hemoglobin: 13.3 g/dL (ref 13.0–17.0)
Immature Granulocytes: 1 %
Lymphocytes Relative: 8 %
Lymphs Abs: 1.2 10*3/uL (ref 0.7–4.0)
MCH: 28.6 pg (ref 26.0–34.0)
MCHC: 31.4 g/dL (ref 30.0–36.0)
MCV: 91 fL (ref 80.0–100.0)
Monocytes Absolute: 0.6 10*3/uL (ref 0.1–1.0)
Monocytes Relative: 4 %
Neutro Abs: 14.5 10*3/uL — ABNORMAL HIGH (ref 1.7–7.7)
Neutrophils Relative %: 87 %
Platelets: 272 10*3/uL (ref 150–400)
RBC: 4.65 MIL/uL (ref 4.22–5.81)
RDW: 13.6 % (ref 11.5–15.5)
WBC: 16.5 10*3/uL — ABNORMAL HIGH (ref 4.0–10.5)
nRBC: 0 % (ref 0.0–0.2)

## 2021-12-27 LAB — COMPREHENSIVE METABOLIC PANEL
ALT: 24 U/L (ref 0–44)
AST: 17 U/L (ref 15–41)
Albumin: 3.4 g/dL — ABNORMAL LOW (ref 3.5–5.0)
Alkaline Phosphatase: 84 U/L (ref 38–126)
Anion gap: 8 (ref 5–15)
BUN: 19 mg/dL (ref 8–23)
CO2: 27 mmol/L (ref 22–32)
Calcium: 8.9 mg/dL (ref 8.9–10.3)
Chloride: 104 mmol/L (ref 98–111)
Creatinine, Ser: 1.03 mg/dL (ref 0.61–1.24)
GFR, Estimated: >60 mL/min (ref >60)
Glucose, Bld: 137 mg/dL — ABNORMAL HIGH (ref 70–99)
Potassium: 4.2 mmol/L (ref 3.5–5.1)
Sodium: 139 mmol/L (ref 135–145)
Total Bilirubin: 0.4 mg/dL (ref 0.3–1.2)
Total Protein: 7 g/dL (ref 6.5–8.1)

## 2021-12-27 LAB — RESP PANEL BY RT-PCR (FLU A&B, COVID) ARPGX2
Influenza A by PCR: NEGATIVE
Influenza B by PCR: NEGATIVE
SARS Coronavirus 2 by RT PCR: NEGATIVE

## 2021-12-27 LAB — LACTIC ACID, PLASMA: Lactic Acid, Venous: 1.7 mmol/L (ref 0.5–1.9)

## 2021-12-27 LAB — BRAIN NATRIURETIC PEPTIDE: B Natriuretic Peptide: 59.2 pg/mL (ref 0.0–100.0)

## 2021-12-27 MED ORDER — AZITHROMYCIN 500 MG PO TABS
250.0000 mg | ORAL_TABLET | Freq: Every day | ORAL | Status: DC
Start: 2021-12-28 — End: 2021-12-29
  Administered 2021-12-28 – 2021-12-29 (×2): 250 mg via ORAL
  Filled 2021-12-27 (×2): qty 1

## 2021-12-27 MED ORDER — FLUTICASONE FUROATE-VILANTEROL 100-25 MCG/ACT IN AEPB
1.0000 | INHALATION_SPRAY | Freq: Every day | RESPIRATORY_TRACT | Status: DC
  Administered 2021-12-28 – 2022-01-02 (×6): 1 via RESPIRATORY_TRACT
  Filled 2021-12-27 (×2): qty 28

## 2021-12-27 MED ORDER — ACETAMINOPHEN 325 MG PO TABS
650.0000 mg | ORAL_TABLET | Freq: Four times a day (QID) | ORAL | Status: DC | PRN
  Administered 2021-12-31 – 2022-01-01 (×3): 650 mg via ORAL
  Filled 2021-12-27 (×3): qty 2

## 2021-12-27 MED ORDER — ALBUTEROL SULFATE HFA 108 (90 BASE) MCG/ACT IN AERS
2.0000 | INHALATION_SPRAY | Freq: Once | RESPIRATORY_TRACT | Status: AC
  Administered 2021-12-27: 2 via RESPIRATORY_TRACT
  Filled 2021-12-27: qty 6.7

## 2021-12-27 MED ORDER — ENOXAPARIN SODIUM 40 MG/0.4ML IJ SOSY
40.0000 mg | PREFILLED_SYRINGE | INTRAMUSCULAR | Status: DC
  Administered 2021-12-27 – 2022-01-01 (×6): 40 mg via SUBCUTANEOUS
  Filled 2021-12-27 (×6): qty 0.4

## 2021-12-27 MED ORDER — UMECLIDINIUM BROMIDE 62.5 MCG/ACT IN AEPB
1.0000 | INHALATION_SPRAY | Freq: Every day | RESPIRATORY_TRACT | Status: DC
  Administered 2021-12-28 – 2022-01-02 (×6): 1 via RESPIRATORY_TRACT
  Filled 2021-12-27 (×2): qty 7

## 2021-12-27 MED ORDER — ALBUTEROL SULFATE (2.5 MG/3ML) 0.083% IN NEBU
2.5000 mg | INHALATION_SOLUTION | RESPIRATORY_TRACT | Status: DC | PRN
  Administered 2021-12-28 – 2022-01-01 (×3): 2.5 mg via RESPIRATORY_TRACT
  Filled 2021-12-27 (×3): qty 3

## 2021-12-27 MED ORDER — PANTOPRAZOLE SODIUM 40 MG PO TBEC
40.0000 mg | DELAYED_RELEASE_TABLET | Freq: Every day | ORAL | Status: DC
  Administered 2021-12-28 – 2022-01-02 (×6): 40 mg via ORAL
  Filled 2021-12-27 (×6): qty 1

## 2021-12-27 MED ORDER — LEVOTHYROXINE SODIUM 100 MCG PO TABS
200.0000 ug | ORAL_TABLET | Freq: Every day | ORAL | Status: DC
  Administered 2021-12-28 – 2022-01-02 (×6): 200 ug via ORAL
  Filled 2021-12-27 (×6): qty 2

## 2021-12-27 MED ORDER — IOHEXOL 350 MG/ML SOLN
80.0000 mL | Freq: Once | INTRAVENOUS | Status: AC | PRN
  Administered 2021-12-27: 80 mL via INTRAVENOUS

## 2021-12-27 MED ORDER — ACETAMINOPHEN 650 MG RE SUPP: 650.0000 mg | Freq: Four times a day (QID) | RECTAL | Status: DC | PRN

## 2021-12-27 MED ORDER — IPRATROPIUM-ALBUTEROL 0.5-2.5 (3) MG/3ML IN SOLN
3.0000 mL | Freq: Four times a day (QID) | RESPIRATORY_TRACT | Status: DC
  Administered 2021-12-27 – 2021-12-28 (×5): 3 mL via RESPIRATORY_TRACT
  Filled 2021-12-27 (×4): qty 3

## 2021-12-27 MED ORDER — CARVEDILOL 6.25 MG PO TABS
6.2500 mg | ORAL_TABLET | Freq: Two times a day (BID) | ORAL | Status: DC
  Administered 2021-12-28 – 2022-01-02 (×11): 6.25 mg via ORAL
  Filled 2021-12-27 (×4): qty 1
  Filled 2021-12-27: qty 2
  Filled 2021-12-27 (×6): qty 1

## 2021-12-27 MED ORDER — POLYETHYLENE GLYCOL 3350 17 G PO PACK: 17.0000 g | PACK | Freq: Every day | ORAL | Status: DC | PRN

## 2021-12-27 MED ORDER — SODIUM CHLORIDE 0.9% FLUSH
3.0000 mL | Freq: Two times a day (BID) | INTRAVENOUS | Status: DC
  Administered 2021-12-28 – 2022-01-02 (×11): 3 mL via INTRAVENOUS

## 2021-12-27 MED ORDER — METHYLPREDNISOLONE SODIUM SUCC 125 MG IJ SOLR
125.0000 mg | Freq: Once | INTRAMUSCULAR | Status: AC
  Administered 2021-12-27: 125 mg via INTRAVENOUS
  Filled 2021-12-27: qty 2

## 2021-12-27 MED ORDER — PREDNISONE 20 MG PO TABS: 40.0000 mg | ORAL_TABLET | Freq: Every day | ORAL | Status: DC

## 2021-12-27 MED ORDER — SACUBITRIL-VALSARTAN 49-51 MG PO TABS
1.0000 | ORAL_TABLET | Freq: Two times a day (BID) | ORAL | Status: DC
  Administered 2021-12-28 – 2021-12-31 (×9): 1 via ORAL
  Filled 2021-12-27 (×11): qty 1

## 2021-12-27 MED ORDER — ATORVASTATIN CALCIUM 40 MG PO TABS
40.0000 mg | ORAL_TABLET | Freq: Every day | ORAL | Status: DC
  Administered 2021-12-28 – 2022-01-02 (×6): 40 mg via ORAL
  Filled 2021-12-27 (×6): qty 1

## 2021-12-27 MED ORDER — AEROCHAMBER PLUS FLO-VU LARGE MISC: 1.0000 | Freq: Once | Status: DC

## 2021-12-27 NOTE — H&P (Signed)
?History and Physical  ? ?[name]. MWU:[id number] DOB: October 11, 1940 DOA: 12/27/2021 ? ?PCP: [name], MD  ? ?Patient coming from: Home ? ?Chief Complaint: Shortness of breath ? ?HPI: Arthur [name]. is a 81 y.o. male with medical history significant of hypothyroidism, hyperlipidemia, hypertension, GERD, COPD, CHF, prostate cancer, thyroid cancer presenting with cough and shortness of breath. ? ?Patient has had ongoing cough shortness of breath for the past week or so.  Has seen his pulmonologist twice in that time.  Initially was prescribed a Z-Pak and 2 mg daily prednisone.  Went back for follow-up and prednisone dose was increased to 4 mg daily.  Patient has not improved despite this increased dose and present to the ED for further evaluation.  Reports productive cough with gray sputum.  Also reports chest tightness in dyspnea on exertion.  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 ? ?He denies fevers, chills, abdominal pain, constipation, diarrhea, nausea, vomiting. ? ?ED Course: Vital signs in the ED significant for respiratory rate in the 20s, blood pressure in the 725D to 664Q systolic, requiring 2 to 4 L to maintain saturations.  Lab work-up included CMP which showed glucose 137, albumin 3.4.  CBC with leukocytosis to 16.5.  Lactic acid normal on first check.  BMP normal.  Rest were panel for flu and COVID-negative.  Blood cultures pending.  Chest x-ray showed stable emphysema.  CTA PE study has been ordered and is pending.  Patient received Solu-Medrol albuterol in the ED. ? ?Review of Systems: As per HPI otherwise all other systems reviewed and are negative. ? ?Past Medical History:  ?Diagnosis Date  ? Arthritis   ? Bifascicular block   ? a. 1st degree AVB/LBBB.  ? Cancer [facility])   ? thyroid and prostate  ? Cataract   ? Chronic systolic CHF (congestive heart failure) ([place])   ? COPD (chronic obstructive pulmonary disease) ([place])   ? ED (erectile dysfunction)    ? Essential hypertension, benign   ? chris guess  pcp  ? GERD (gastroesophageal reflux disease)   ? Hypercholesterolemia   ? Kidney stone   ? renal stone  ? LBBB (left bundle branch block) 10/2007  ? Migraines   ? Mild dietary indigestion   ? NICM (nonischemic cardiomyopathy) ([place])   ? Nonischemic cardiomyopathy ([place])   ? Prostate cancer ([place])   ? Reflux esophagitis   ? Reflux esophagitis   ? Shortness of breath dyspnea   ? W/ EXERTION   ? Spider bite   ? Thyroid cancer ([place])   ? Tobacco use disorder   ? Unspecified hypothyroidism   ? ? ?Past Surgical History:  ?Procedure Laterality Date  ? CARDIAC CATHETERIZATION    ? 2009   no stents  ? CARDIAC CATHETERIZATION N/A 09/02/2016  ? Procedure: Left Heart Cath and Coronary Angiography;  Surgeon: [name], MD;  Location: [place] [facility];  Service: Cardiovascular;  Laterality: N/A;  ? CHOLECYSTECTOMY N/A 03/30/2017  ? Procedure: LAPAROSCOPIC CHOLECYSTECTOMY;  Surgeon: [name], MD;  Location: [place];  Service: General;  Laterality: N/A;  ? CHOLECYSTECTOMY  2018  ? COLONOSCOPY  Multiple  ? Adenomatous polyps  ? CYSTOSCOPY/RETROGRADE/URETEROSCOPY/STONE EXTRACTION WITH BASKET Left 09/24/2014  ? Procedure: CYSTOSCOPY/RETROGRADE/URETEROSCOPY/STONE EXTRACTION WITH BASKET FROM URETER AND KIDNEY/STENT PLACEMENT;  Surgeon: [name], MD;  Location: [facility];  Service: Urology;  Laterality: Left;  ? ESOPHAGOGASTRODUODENOSCOPY  Multiple  ? GERD  ?  FOOT SURGERY    ? RIGHT   ? HERNIA REPAIR  1976  ? HOLMIUM LASER APPLICATION Left 99/35/7017  ? Procedure: HOLMIUM LASER APPLICATION;  Surgeon: [name], MD;  Location: [facility];  Service: Urology;  Laterality: Left;  ? NASAL SINUS SURGERY    ? NECK SURGERY    ? plates/screws from [place]  ? PROSTATECTOMY    ? SINUS ENDO W/FUSION Bilateral 10/28/2015  ? Procedure: ENDOSCOPIC SINUS SURGERY WITH NAVIGATION;  Surgeon: [name], MD;  Location: [place];  Service: ENT;  Laterality: Bilateral;  ? THYROIDECTOMY    ? TOTAL KNEE  ARTHROPLASTY    ? right  ? TOTAL KNEE ARTHROPLASTY  08/03/2012  ? Procedure: TOTAL KNEE ARTHROPLASTY;  Surgeon: [name], MD;  Location: [place];  Service: Orthopedics;  Laterality: Left;  ? UPPER GASTROINTESTINAL ENDOSCOPY    ? ? ?Social History ? reports that he quit smoking about 5 years ago. His smoking use included cigarettes. He has a 5.20 pack-year smoking history. He has never used smokeless tobacco. He reports current alcohol use. He reports that he does not use drugs. ? ?Allergies  ?Allergen Reactions  ? Doxycycline Other (See Comments)  ?  Esophagitis, severe gi bleed  ? ? ?Family History  ?Problem Relation Age of Onset  ? Colon cancer Brother   ? Heart attack Father   ? Skin cancer Brother   ? Diabetes Brother   ? Esophageal cancer Neg Hx   ? Rectal cancer Neg Hx   ? Stomach cancer Neg Hx   ? Colon polyps Neg Hx   ?Reviewed on admission ? ?Prior to Admission medications   ?Medication Sig Start Date End Date Taking? Authorizing Provider  ?albuterol (VENTOLIN HFA) 108 (90 Base) MCG/ACT inhaler Inhale 1-2 puffs into the lungs every 6 (six) hours as needed for wheezing or shortness of breath. 12/14/21   [name], MD  ?atorvastatin (LIPITOR) 40 MG tablet TAKE 1 TABLET BY MOUTH EVERY DAY 12/07/21   [name], MD  ?azithromycin (ZITHROMAX) 250 MG tablet Take 1 tablet (250 mg total) by mouth daily. 12/22/21   [name], MD  ?carvedilol (COREG) 6.25 MG tablet TAKE 1 TABLET(6.25 MG) BY MOUTH TWICE DAILY WITH A MEAL 01/01/21   [name], MD  ?levothyroxine (SYNTHROID) 200 MCG tablet Take 1 tablet (200 mcg total) by mouth daily before breakfast. 07/14/21   Eulogio Bear, NP  ?pantoprazole (PROTONIX) 40 MG tablet TAKE 1 TABLET(40 MG) BY MOUTH DAILY BEFORE BREAKFAST 05/10/21   [name], MD  ?sacubitril-valsartan (ENTRESTO) 49-51 MG Take 1 tablet by mouth 2 (two) times daily. 04/14/21   [name], MD  ?Donnal Debar 100-62.5-25 MCG/ACT AEPB INHALE 1 PUFF INTO THE LUNGS DAILY  08/25/21   [name], MD  ? ? ?Physical Exam: ?Vitals:  ? 12/27/21 1645 12/27/21 1730 12/27/21 1815 12/27/21 1834  ?BP:  (!) 153/105 (!) 150/85   ?Pulse: 78 66 79 70  ?Resp: (!) 22 (!) 21 (!) 26 (!) 21  ?Temp:      ?TempSrc:      ?SpO2: 96% 96% 94% 96%  ? ? ?Physical Exam ?Constitutional:   ?   General: He is not in acute distress. ?   Appearance: Normal appearance.  ?HENT:  ?   Head: Normocephalic and atraumatic.  ?   Mouth/Throat:  ?   Mouth: Mucous membranes are moist.  ?   Pharynx: Oropharynx is clear.  ?Eyes:  ?  Extraocular Movements: Extraocular movements intact.  ?   Pupils: Pupils are equal, round, and reactive to light.  ?Cardiovascular:  ?   Rate and Rhythm: Normal rate and regular rhythm.  ?   Pulses: Normal pulses.  ?   Heart sounds: Normal heart sounds.  ?Pulmonary:  ?   Effort: Pulmonary effort is normal. No respiratory distress.  ?   Breath sounds: Wheezing present.  ?Abdominal:  ?   General: Bowel sounds are normal. There is no distension.  ?   Palpations: Abdomen is soft.  ?   Tenderness: There is no abdominal tenderness.  ?Musculoskeletal:     ?   General: No swelling or deformity.  ?Skin: ?   General: Skin is warm and dry.  ?Neurological:  ?   General: No focal deficit present.  ?   Mental Status: Mental status is at baseline.  ? ?Labs on Admission: I have personally reviewed following labs and imaging studies ? ?CBC: ?Recent Labs  ?Lab 12/27/21 ?1633  ?WBC 16.5*  ?NEUTROABS 14.5*  ?HGB 13.3  ?HCT 42.3  ?MCV 91.0  ?PLT 272  ? ? ?Basic Metabolic Panel: ?Recent Labs  ?Lab 12/27/21 ?1633  ?NA 139  ?K 4.2  ?CL 104  ?CO2 27  ?GLUCOSE 137*  ?BUN 19  ?CREATININE 1.03  ?CALCIUM 8.9  ? ? ?GFR: ?Estimated Creatinine Clearance: 75 mL/min (by C-G formula based on SCr of 1.03 mg/dL). ? ?Liver Function Tests: ?Recent Labs  ?Lab 12/27/21 ?1633  ?AST 17  ?ALT 24  ?ALKPHOS 84  ?BILITOT 0.4  ?PROT 7.0  ?ALBUMIN 3.4*  ? ? ?Urine analysis: ?   ?Component Value Date/Time  ? COLORURINE YELLOW 04/08/2021 1503  ?  APPEARANCEUR CLEAR 04/08/2021 1503  ? LABSPEC >1.030 (H) 04/08/2021 1503  ? PHURINE 5.0 04/08/2021 1503  ? GLUCOSEU NEGATIVE 04/08/2021 1503  ? HGBUR NEGATIVE 04/08/2021 1503  ? BILIRUBINUR SMALL (A) 04/08/2021

## 2021-12-27 NOTE — ED Provider Triage Note (Signed)
Emergency Medicine Provider Triage Evaluation Note ? ?Arthur [name] , a 81 y.o. male  was evaluated in triage.  Pt complains of productive cough and shortness of breath.  Patient reports that he has been having productive cough and shortness of breath since last Monday.  Symptoms have been getting progressively worse.  Per chart review patient saw his pulmonologist and was started on Z-Pak as well as steroid taper due to COPD exacerbation.  Patient has been using his albuterol inhaler every hour with minimal improvement in his breathing.  Patient endorses chest pain when coughing.  Denies any chest pain outside of this. ? ?Denies any leg swelling or tenderness, palpitations,, known sick contacts. ? ?Review of Systems  ?Positive: Productive cough, shortness of breath ?Negative: See above ? ?Physical Exam  ?BP (!) 141/79   Pulse 86   Temp 99 ?F (37.2 ?C) (Oral)   Resp (!) 25   SpO2 92%  ?Gen:   Awake, no distress   ?Resp:  Speaks in shortened sentences expiratory wheezing throughout.  Coarse lung sounds throughout.  Rales to left lower lobe. ?MSK:   Moves extremities without difficulty; no swelling or tenderness to bilateral lower extremities ?Other:   ? ?Medical Decision Making  ?Medically screening exam initiated at 2:55 PM.  Appropriate orders placed.  Arthur [name]. was informed that the remainder of the evaluation will be completed by another provider, this initial triage assessment does not replace that evaluation, and the importance of remaining in the ED until their evaluation is complete. ? ?Patient has history of CHF concern for possible CHF exacerbation.  Concern for worsening COPD exacerbation versus pneumonia. ?  ?Loni Beckwith, PA-C ?12/27/21 1458 ? ?

## 2021-12-27 NOTE — ED Provider Notes (Signed)
?[facility] ?Provider Note ? ? ?CSN: [id number] ?Arrival date & time: 12/27/21  1415 ? ?  ? ?History ? ?Chief Complaint  ?Patient presents with  ? Shortness of Breath  ? Cough  ? ? ?Arthur [name]. is a 81 y.o. male. ? ?81 y.o male with a PMH of COPD, LBBB, presents to the ED with a chief complaint of shortness of breath x1 week.  Patient previously evaluated by his pulmonologist Dr. Kara Mead was started on a Z-Pak, 2 mg steroids for 4 days.  Patient was then evaluated by his pulmonologist after, was up the dose of the prednisone to 4 mg daily, he has been on this therapy for the past 2 days without any improvements.  He continues to complain of a productive cough with some clear to gray sputum.  His last tobacco use was in the month of January.  He does not wear oxygen at home and runs a baseline of 95% on room air.  Continues to endorse chest tightness, reports he has not been able to lie flat, continues to cough.  This of breath is exacerbated with any type of walking, activity.  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  Denies any prior vaccination against COVID-19, influenza.  Denies any fever, chest pain, leg swelling.  No prior history of VTE or PE. ? ? ? ? ?The history is provided by the patient and medical records.  ?Shortness of Breath ?Severity:  Moderate ?Onset quality:  Gradual ?Duration:  1 week ?Timing:  Constant ?Progression:  Worsening ?Associated symptoms: cough   ?Associated symptoms: no abdominal pain, no chest pain, no fever, no headaches, no sore throat and no vomiting   ?Cough ?Associated symptoms: shortness of breath   ?Associated symptoms: no chest pain, no chills, no fever, no headaches and no sore throat   ? ?  ? ?Home Medications ?Prior to Admission medications   ?Medication Sig Start Date End Date Taking? Authorizing Provider  ?albuterol (VENTOLIN HFA) 108 (90 Base) MCG/ACT inhaler Inhale 1-2 puffs into the  lungs every 6 (six) hours as needed for wheezing or shortness of breath. 12/14/21   [name], MD  ?atorvastatin (LIPITOR) 40 MG tablet TAKE 1 TABLET BY MOUTH EVERY DAY 12/07/21   [name], MD  ?azithromycin (ZITHROMAX) 250 MG tablet Take 1 tablet (250 mg total) by mouth daily. 12/22/21   [name], MD  ?carvedilol (COREG) 6.25 MG tablet TAKE 1 TABLET(6.25 MG) BY MOUTH TWICE DAILY WITH A MEAL 01/01/21   [name], MD  ?levothyroxine (SYNTHROID) 200 MCG tablet Take 1 tablet (200 mcg total) by mouth daily before breakfast. 07/14/21   Eulogio Bear, NP  ?pantoprazole (PROTONIX) 40 MG tablet TAKE 1 TABLET(40 MG) BY MOUTH DAILY BEFORE BREAKFAST 05/10/21   [name], MD  ?predniSONE (DELTASONE) 10 MG tablet Take 1 tablet (10 mg total) by mouth daily with breakfast. 12/14/21   [name], MD  ?predniSONE (DELTASONE) 10 MG tablet Take 4 tablets (40 mg total) by mouth daily with breakfast for 4 days, THEN 3 tablets (30 mg total) daily with breakfast for 4 days, THEN 2 tablets (20 mg total) daily with breakfast for 4 days, THEN 1 tablet (10 mg total) daily with breakfast for 4 days. 12/22/21 01/07/22  [name], MD  ?sacubitril-valsartan (ENTRESTO) 49-51 MG Take 1 tablet by mouth 2 (two) times daily. 04/14/21   Nishan,  Wallis Bamberg, MD  ?Donnal Debar 100-62.5-25 MCG/ACT AEPB INHALE 1 PUFF INTO THE LUNGS DAILY 08/25/21   [name], MD  ?   ? ?Allergies    ?Doxycycline   ? ?Review of Systems   ?Review of Systems  ?Constitutional:  Negative for chills and fever.  ?HENT:  Negative for sore throat.   ?Respiratory:  Positive for cough, chest tightness and shortness of breath.   ?Cardiovascular:  Negative for chest pain.  ?Gastrointestinal:  Negative for abdominal pain, nausea and vomiting.  ?Genitourinary:  Negative for flank pain.  ?Neurological:  Negative for dizziness, light-headedness and headaches.  ?All other systems reviewed and are negative. ? ?Physical Exam ?Updated Vital Signs ?BP (!)  150/85   Pulse 70   Temp 99 ?F (37.2 ?C) (Oral)   Resp (!) 21   SpO2 96%  ?Physical Exam ?Vitals and nursing note reviewed.  ?Constitutional:   ?   Appearance: He is well-developed.  ?HENT:  ?   Head: Normocephalic and atraumatic.  ?Eyes:  ?   Pupils: Pupils are equal, round, and reactive to light.  ?Cardiovascular:  ?   Rate and Rhythm: Normal rate.  ?Pulmonary:  ?   Effort: Tachypnea present.  ?   Breath sounds: Examination of the right-upper field reveals wheezing. Examination of the left-upper field reveals wheezing. Examination of the right-lower field reveals wheezing. Examination of the left-lower field reveals wheezing. Wheezing present. No decreased breath sounds.  ?Chest:  ?   Chest wall: No tenderness.  ?Abdominal:  ?   Palpations: Abdomen is soft. There is no mass.  ?Musculoskeletal:  ?   Right lower leg: No edema.  ?   Left lower leg: No edema.  ?   Comments: No BL leg edema, no calf tenderness.   ?Skin: ?   General: Skin is warm and dry.  ?Neurological:  ?   Mental Status: He is alert and oriented to person, place, and time.  ? ? ?ED Results / Procedures / Treatments   ?Labs ?(all labs ordered are listed, but only abnormal results are displayed) ?Labs Reviewed  ?COMPREHENSIVE METABOLIC PANEL - Abnormal; Notable for the following components:  ?    Result Value  ? Glucose, Bld 137 (*)   ? Albumin 3.4 (*)   ? All other components within normal limits  ?CBC WITH DIFFERENTIAL/PLATELET - Abnormal; Notable for the following components:  ? WBC 16.5 (*)   ? Neutro Abs 14.5 (*)   ? Abs Immature Granulocytes 0.13 (*)   ? All other components within normal limits  ?RESP PANEL BY RT-PCR (FLU A&B, COVID) [id number]  ?CULTURE, BLOOD (ROUTINE X 2)  ?CULTURE, BLOOD (ROUTINE X 2)  ?LACTIC ACID, PLASMA  ?BRAIN NATRIURETIC PEPTIDE  ? ? ?EKG ?EKG Interpretation ? ?Date/Time:  Monday December 27 2021 16:36:07 EDT ?Ventricular Rate:  68 ?PR Interval:  274 ?QRS Duration: 143 ?QT Interval:  457 ?QTC Calculation: 487 ?R  Axis:   86 ?Text Interpretation: Sinus rhythm Prolonged PR interval Nonspecific intraventricular conduction delay Repol abnrm suggests ischemia, inferior leads Confirmed by Thamas Jaegers ([id number]) on 12/27/2021 6:13:26 PM ? ?Radiology ?DG Chest 2 View ? ?Result Date: 12/27/2021 ?CLINICAL DATA:  Cough, congestion, short of breath for 1 week EXAM: CHEST - 2 VIEW COMPARISON:  12/22/2021 FINDINGS: Frontal and lateral views of the chest demonstrate a stable cardiac silhouette. Stable hyperinflation and background scarring consistent with emphysema. No acute airspace disease, effusion, or pneumothorax. Stable postsurgical changes from thyroidectomy and lower cervical fusion. No  acute bony abnormalities. IMPRESSION: 1. Stable emphysema.  No acute process. Electronically Signed   By: Randa Ngo M.D.   On: 12/27/2021 15:27   ? ?Procedures ?Procedures  ? ? ?Medications Ordered in ED ?Medications  ?AeroChamber Plus Flo-Vu Large MISC 1 each (has no administration in time range)  ?methylPREDNISolone sodium succinate (SOLU-MEDROL) 125 mg/2 mL injection 125 mg (125 mg Intravenous Given 12/27/21 1716)  ?albuterol (VENTOLIN HFA) 108 (90 Base) MCG/ACT inhaler 2 puff (2 puffs Inhalation Given 12/27/21 1716)  ? ? ?ED Course/ Medical Decision Making/ A&P ?  ?                        ?Medical Decision Making ?Amount and/or Complexity of Data Reviewed ?Radiology: ordered. ? ?Risk ?Prescription drug management. ? ? ?This patient presents to the ED for concern of shortness of breath, this involves a number of treatment options, and is a complaint that carries with it a high risk of complications and morbidity.  The differential diagnosis includes Pulmonary embolism, Pneumonia versus COPD exacerbation.  ? ? ?Co morbidities: ?Discussed in HPI ? ? ?Brief History: ? ?Patient with a past medical history of COPD presents to the ED with worsening shortness of breath that is been ongoing for the past week.  Evaluated by his pulmonologist Dr. Eartha Inch who  placed him on steroids, Z-Pak.  Reports his steroids got increased a couple of days ago, he continues to have no relief.  Continues with a productive cough, no oxygen at baseline.  However arrived to the ED with an oxygen s

## 2021-12-27 NOTE — Telephone Encounter (Signed)
Spoke to patient.  ?C/o severe SOB, prod cough with grey to clear sputum, wheezing and night sweats x1wk. ?Currently taking prednisone taper at '30mg'$  and Azithromycin. He will complete zpak tomorrow.   ?Denied fever, chills or additional sx.  ?He is using albuterol HFA 6-7x daily and trelegy once daily. ?He does not wear supplemental oxygen or monitor oxygen levels.  ?He is unable to complete full sentence without stopping to caught his breath.  ?Work on breathing noted during call. ? ?Dr. Elsworth Soho, please advise. Thanks ?

## 2021-12-27 NOTE — Telephone Encounter (Signed)
Patient is aware of below message/recommendations and voiced his understanding.  ?He will go to ED now.  ?Nothing further needed.  ? ?

## 2021-12-27 NOTE — ED Triage Notes (Signed)
Patient coming from home, complaint of cough and shortness of breath for approximately 1 week states getting worse. ?

## 2021-12-28 DIAGNOSIS — E7800 Pure hypercholesterolemia, unspecified: Secondary | ICD-10-CM | POA: Diagnosis not present

## 2021-12-28 DIAGNOSIS — J441 Chronic obstructive pulmonary disease with (acute) exacerbation: Secondary | ICD-10-CM | POA: Diagnosis not present

## 2021-12-28 DIAGNOSIS — I110 Hypertensive heart disease with heart failure: Secondary | ICD-10-CM | POA: Diagnosis not present

## 2021-12-28 DIAGNOSIS — Z8546 Personal history of malignant neoplasm of prostate: Secondary | ICD-10-CM | POA: Diagnosis not present

## 2021-12-28 DIAGNOSIS — R0602 Shortness of breath: Secondary | ICD-10-CM | POA: Diagnosis present

## 2021-12-28 DIAGNOSIS — I428 Other cardiomyopathies: Secondary | ICD-10-CM | POA: Diagnosis not present

## 2021-12-28 DIAGNOSIS — I5042 Chronic combined systolic (congestive) and diastolic (congestive) heart failure: Secondary | ICD-10-CM

## 2021-12-28 DIAGNOSIS — I5032 Chronic diastolic (congestive) heart failure: Secondary | ICD-10-CM | POA: Diagnosis not present

## 2021-12-28 DIAGNOSIS — Z808 Family history of malignant neoplasm of other organs or systems: Secondary | ICD-10-CM | POA: Diagnosis not present

## 2021-12-28 DIAGNOSIS — Z800 Family history of malignant neoplasm of digestive organs: Secondary | ICD-10-CM | POA: Diagnosis not present

## 2021-12-28 DIAGNOSIS — Z7951 Long term (current) use of inhaled steroids: Secondary | ICD-10-CM | POA: Diagnosis not present

## 2021-12-28 DIAGNOSIS — Z79890 Hormone replacement therapy (postmenopausal): Secondary | ICD-10-CM | POA: Diagnosis not present

## 2021-12-28 DIAGNOSIS — Z20822 Contact with and (suspected) exposure to covid-19: Secondary | ICD-10-CM | POA: Diagnosis not present

## 2021-12-28 DIAGNOSIS — K219 Gastro-esophageal reflux disease without esophagitis: Secondary | ICD-10-CM | POA: Diagnosis not present

## 2021-12-28 DIAGNOSIS — Z881 Allergy status to other antibiotic agents status: Secondary | ICD-10-CM | POA: Diagnosis not present

## 2021-12-28 DIAGNOSIS — Z79899 Other long term (current) drug therapy: Secondary | ICD-10-CM | POA: Diagnosis not present

## 2021-12-28 DIAGNOSIS — I10 Essential (primary) hypertension: Secondary | ICD-10-CM | POA: Diagnosis not present

## 2021-12-28 DIAGNOSIS — Z87891 Personal history of nicotine dependence: Secondary | ICD-10-CM | POA: Diagnosis not present

## 2021-12-28 DIAGNOSIS — I452 Bifascicular block: Secondary | ICD-10-CM | POA: Diagnosis not present

## 2021-12-28 DIAGNOSIS — Z833 Family history of diabetes mellitus: Secondary | ICD-10-CM | POA: Diagnosis not present

## 2021-12-28 DIAGNOSIS — T380X5A Adverse effect of glucocorticoids and synthetic analogues, initial encounter: Secondary | ICD-10-CM | POA: Diagnosis not present

## 2021-12-28 DIAGNOSIS — Z85850 Personal history of malignant neoplasm of thyroid: Secondary | ICD-10-CM | POA: Diagnosis not present

## 2021-12-28 DIAGNOSIS — J9621 Acute and chronic respiratory failure with hypoxia: Secondary | ICD-10-CM | POA: Diagnosis not present

## 2021-12-28 DIAGNOSIS — Z8249 Family history of ischemic heart disease and other diseases of the circulatory system: Secondary | ICD-10-CM | POA: Diagnosis not present

## 2021-12-28 DIAGNOSIS — J439 Emphysema, unspecified: Secondary | ICD-10-CM | POA: Diagnosis not present

## 2021-12-28 LAB — CBC
HCT: 44.3 % (ref 39.0–52.0)
Hemoglobin: 13.8 g/dL (ref 13.0–17.0)
MCH: 27.9 pg (ref 26.0–34.0)
MCHC: 31.2 g/dL (ref 30.0–36.0)
MCV: 89.7 fL (ref 80.0–100.0)
Platelets: 313 10*3/uL (ref 150–400)
RBC: 4.94 MIL/uL (ref 4.22–5.81)
RDW: 13.5 % (ref 11.5–15.5)
WBC: 14.1 10*3/uL — ABNORMAL HIGH (ref 4.0–10.5)
nRBC: 0 % (ref 0.0–0.2)

## 2021-12-28 LAB — COMPREHENSIVE METABOLIC PANEL
ALT: 26 U/L (ref 0–44)
AST: 21 U/L (ref 15–41)
Albumin: 3.4 g/dL — ABNORMAL LOW (ref 3.5–5.0)
Alkaline Phosphatase: 92 U/L (ref 38–126)
Anion gap: 11 (ref 5–15)
BUN: 21 mg/dL (ref 8–23)
CO2: 29 mmol/L (ref 22–32)
Calcium: 9.2 mg/dL (ref 8.9–10.3)
Chloride: 98 mmol/L (ref 98–111)
Creatinine, Ser: 0.98 mg/dL (ref 0.61–1.24)
GFR, Estimated: >60 mL/min (ref >60)
Glucose, Bld: 149 mg/dL — ABNORMAL HIGH (ref 70–99)
Potassium: 4.1 mmol/L (ref 3.5–5.1)
Sodium: 138 mmol/L (ref 135–145)
Total Bilirubin: 0.6 mg/dL (ref 0.3–1.2)
Total Protein: 7.5 g/dL (ref 6.5–8.1)

## 2021-12-28 LAB — PROCALCITONIN: Procalcitonin: <0.1 ng/mL

## 2021-12-28 LAB — RESPIRATORY PANEL BY PCR

## 2021-12-28 MED ORDER — METHYLPREDNISOLONE SODIUM SUCC 125 MG IJ SOLR
120.0000 mg | Freq: Every day | INTRAMUSCULAR | Status: DC
  Administered 2021-12-28 – 2021-12-29 (×2): 120 mg via INTRAVENOUS
  Filled 2021-12-28 (×2): qty 2

## 2021-12-28 MED ORDER — IPRATROPIUM-ALBUTEROL 0.5-2.5 (3) MG/3ML IN SOLN
3.0000 mL | Freq: Four times a day (QID) | RESPIRATORY_TRACT | Status: DC
  Administered 2021-12-29 – 2021-12-30 (×5): 3 mL via RESPIRATORY_TRACT
  Filled 2021-12-28 (×5): qty 3

## 2021-12-28 NOTE — Care Management (Signed)
?  Transition of Care (TOC) Screening Note ? ? ?Patient Details  ?Name: Arthur [name]. ?Date of Birth: 18-Sep-1941 ? ? ?Transition of Care (TOC) CM/SW Contact:    ?Carles Collet, RN ?Phone Number: ?12/28/2021, 3:12 PM ? ? ? ?Transition of Care Department [facility]) has reviewed patient and we will continue to monitor patient advancement through interdisciplinary progression rounds. If new patient transition needs arise, please place a TOC consult. ?  ?

## 2021-12-28 NOTE — Assessment & Plan Note (Signed)
Continue PPI ?

## 2021-12-28 NOTE — Progress Notes (Signed)
?PROGRESS NOTE ? ?Arthur [name]. VEH:[id number] DOB: 05/26/1941  ? ?PCP: [name], MD ? ?Patient is from: Home.  Independent at baseline.  Truck driver. ? ?DOA: 12/27/2021 LOS: 0 ? ?Chief complaints ?Chief Complaint  ?Patient presents with  ? Shortness of Breath  ? Cough  ?  ? ?Brief Narrative / Interim history: ?81 year old M with PMH of COPD, systolic CHF/NICM with recovered EF, prostate cancer s/p prostatectomy, thyroid cancer s/p thyroidectomy, hypothyroidism, HTN, HLD and GERD presenting with ongoing productive cough and shortness of breath for over a week that did not improve despite outpatient treatment with Z-Pak and prednisone by his pulmonologist, and admitted for COPD exacerbation.  Work-up in ED including basic labs, BNP and full RVP unrevealing.  CTA chest negative for PE but bronchial wall thickening with mild atelectasis, infiltrate or scarring in the medial aspect of upper lobes bilaterally, stable bilateral pulmonary nodules measuring up to 6 mm and coronary artery calcification.  Patient was started on Solu-Medrol, azithromycin and scheduled DuoNebs, and admitted. ?  ? ?Subjective: ?Seen and examined earlier this morning.  No major events overnight of this morning.  Continues to endorse shortness of breath, productive cough and chest pain with cough.  Denies runny nose, sore throat, GI or UTI symptoms.  He thinks his COPD exacerbation was provoked by recent exposure to pollen and smoke after yard work.  He denies edema, orthopnea or PND. ? ?Objective: ?Vitals:  ? 12/28/21 0300 12/28/21 0500 12/28/21 0600 12/28/21 1033  ?BP: (!) 155/75 (!) 164/82 (!) 166/80 (!) 144/82  ?Pulse: 63 62 63 68  ?Resp: (!) '21 14 14 '$ (!) 22  ?Temp:      ?TempSrc:      ?SpO2: 94% 97% 94% 92%  ? ? ?Examination: ? ?GENERAL: No apparent distress.  Nontoxic. ?HEENT: MMM.  Vision and hearing grossly intact.  ?NECK: Supple.  No apparent JVD.  ?RESP:  No IWOB.  Fair aeration bilaterally. ?CVS:  RRR. Heart sounds normal.   ?ABD/GI/GU: BS+. Abd soft, NTND.  ?MSK/EXT:  Moves extremities. No apparent deformity. No edema.  ?SKIN: no apparent skin lesion or wound ?NEURO: Awake, alert and oriented appropriately.  No apparent focal neuro deficit. ?PSYCH: Calm. Normal affect.  ? ?Procedures:  ?None ? ?Microbiology summarized: ?COVID-19 and influenza PCR nonreactive. ?Full RVP negative. ?Blood cultures NGTD. ? ?Assessment and Plan: ?* COPD exacerbation ([place]) ?Presents with persistent productive cough and SOB despite outpatient treatment with p.o. prednisone and azithromycin.  Reports good compliance with this Trelegy Ellipta.  Administer pollen exposure and smoke exposure from yardwork.  COVID-19, influenza, full RVP and blood cultures negative.  CTA chest raises concern for bronchitis and infiltrates.  BNP within normal.  Still with productive cough, SOB and chest pain from coughing.  Rhonchorous on exam. ?-Continue IV Solu-Medrol, azithromycin, scheduled DuoNeb and home inhaler ?-Wean oxygen as able.  Minimum oxygen to keep saturation above 88%. ?-Check procalcitonin.  If elevated, will add ceftriaxone to cover for CAP ?-May consider changing Coreg to [facility] for better beta-1 selectivity. ?-Incentive spirometry, OOB and ambulatory saturation ? ?Chronic combined systolic and diastolic CHF (congestive heart failure) ([place]) ?TTE in 12/2020 with LVEF of 50 to 55% (35 to 40% a year prior), G1-DD.  Patient is not on diuretics.  On Coreg and Entresto for GDMT.  Appears euvolemic on exam.  BNP within normal. ?-Closely monitor fluid status ? ?Essential hypertension ?BP slightly elevated. ?-Continue home cardiac meds including Coreg and Entresto. ? ?Acute on chronic respiratory failure with  hypoxia ([place]) ?Ruled out.  No documented desaturation.  Not on oxygen at home. ?-Check ambulatory saturation ? ?GERD (gastroesophageal reflux disease) ?Continue PPI ? ?HYPERCHOLESTEROLEMIA ?Continue home statin ? ?Hypothyroidism (acquired) ?Continue home  Synthroid ? ? ? ? ?DVT prophylaxis:  ?enoxaparin (LOVENOX) injection 40 mg Start: 12/27/21 2000 ? ?Code Status: Full code ?Family Communication: Patient and/or RN. Available if any question.  ?Level of care: Telemetry Medical ?Status is: Observation ?The patient will require care spanning > 2 midnights and should be moved to inpatient because: COPD exacerbation requiring IV steroid, nebulizers and antibiotics ? ? ?Final disposition: Likely home in the next 24 to 48 hours ? ?Consultants:  ?None ? ?Sch Meds:  ?Scheduled Meds: ? AeroChamber Plus Flo-Vu Large  1 each Other Once  ? atorvastatin  40 mg Oral Daily  ? azithromycin  250 mg Oral Daily  ? carvedilol  6.25 mg Oral BID WC  ? enoxaparin (LOVENOX) injection  40 mg Subcutaneous Q24H  ? fluticasone furoate-vilanterol  1 puff Inhalation Daily  ? And  ? umeclidinium bromide  1 puff Inhalation Daily  ? ipratropium-albuterol  3 mL Nebulization Q6H  ? levothyroxine  200 mcg Oral QAC breakfast  ? methylPREDNISolone (SOLU-MEDROL) injection  120 mg Intravenous Daily  ? pantoprazole  40 mg Oral Daily  ? sacubitril-valsartan  1 tablet Oral BID  ? sodium chloride flush  3 mL Intravenous Q12H  ? ?Continuous Infusions: ?PRN Meds:.acetaminophen **OR** acetaminophen, albuterol, polyethylene glycol ? ?Antimicrobials: ?Anti-infectives (From admission, onward)  ? ? Start     Dose/Rate Route Frequency Ordered Stop  ? 12/28/21 1000  azithromycin (ZITHROMAX) tablet 250 mg       ? 250 mg Oral Daily 12/27/21 1905    ? ?  ? ? ? ?I have personally reviewed the following labs and images: ?CBC: ?Recent Labs  ?Lab 12/27/21 ?1633 12/28/21 ?0425  ?WBC 16.5* 14.1*  ?NEUTROABS 14.5*  --   ?HGB 13.3 13.8  ?HCT 42.3 44.3  ?MCV 91.0 89.7  ?PLT 272 313  ? ?BMP &GFR ?Recent Labs  ?Lab 12/27/21 ?1633 12/28/21 ?0425  ?NA 139 138  ?K 4.2 4.1  ?CL 104 98  ?CO2 27 29  ?GLUCOSE 137* 149*  ?BUN 19 21  ?CREATININE 1.03 0.98  ?CALCIUM 8.9 9.2  ? ?Estimated Creatinine Clearance: 78.8 mL/min (by C-G formula based  on SCr of 0.98 mg/dL). ?Liver & Pancreas: ?Recent Labs  ?Lab 12/27/21 ?1633 12/28/21 ?0425  ?AST 17 21  ?ALT 24 26  ?ALKPHOS 84 92  ?BILITOT 0.4 0.6  ?PROT 7.0 7.5  ?ALBUMIN 3.4* 3.4*  ? ?No results for input(s): LIPASE, AMYLASE in the last 168 hours. ?No results for input(s): AMMONIA in the last 168 hours. ?Diabetic: ?No results for input(s): HGBA1C in the last 72 hours. ?No results for input(s): GLUCAP in the last 168 hours. ?Cardiac Enzymes: ?No results for input(s): CKTOTAL, CKMB, CKMBINDEX, TROPONINI in the last 168 hours. ?No results for input(s): PROBNP in the last 8760 hours. ?Coagulation Profile: ?No results for input(s): INR, PROTIME in the last 168 hours. ?Thyroid Function Tests: ?No results for input(s): TSH, T4TOTAL, FREET4, T3FREE, THYROIDAB in the last 72 hours. ?Lipid Profile: ?No results for input(s): CHOL, HDL, LDLCALC, TRIG, CHOLHDL, LDLDIRECT in the last 72 hours. ?Anemia Panel: ?No results for input(s): VITAMINB12, FOLATE, FERRITIN, TIBC, IRON, RETICCTPCT in the last 72 hours. ?Urine analysis: ?   ?Component Value Date/Time  ? COLORURINE YELLOW 04/08/2021 1503  ? APPEARANCEUR CLEAR 04/08/2021 1503  ? LABSPEC >1.030 (H)  04/08/2021 1503  ? PHURINE 5.0 04/08/2021 1503  ? GLUCOSEU NEGATIVE 04/08/2021 1503  ? HGBUR NEGATIVE 04/08/2021 1503  ? BILIRUBINUR SMALL (A) 04/08/2021 1503  ? BILIRUBINUR negative 04/14/2016 1509  ? BILIRUBINUR neg 08/05/2014 1112  ? [place] NEGATIVE 04/08/2021 1503  ? PROTEINUR 100 (A) 04/08/2021 1503  ? UROBILINOGEN 0.2 04/14/2016 1509  ? UROBILINOGEN 0.2 12/26/2014 1125  ? NITRITE NEGATIVE 04/08/2021 1503  ? LEUKOCYTESUR NEGATIVE 04/08/2021 1503  ? ?Sepsis Labs: ?Invalid input(s): PROCALCITONIN, LACTICIDVEN ? ?Microbiology: ?Recent Results (from the past 240 hour(s))  ?Resp Panel by RT-PCR (Flu A&B, Covid)     Status: None  ? Collection Time: 12/27/21  4:33 PM  ? Specimen: Nasopharyngeal(NP) swabs in vial transport medium  ?Result Value Ref Range Status  ? SARS Coronavirus  2 by RT PCR NEGATIVE NEGATIVE Final  ?  Comment: (NOTE) ?SARS-CoV-2 target nucleic acids are NOT DETECTED. ? ?The SARS-CoV-2 RNA is generally detectable in upper respiratory ?specimens during the acute phase

## 2021-12-28 NOTE — ED Notes (Addendum)
MD in and had decreased o2 to 1 liter n/c ?

## 2021-12-28 NOTE — Hospital Course (Signed)
81 year old M with PMH of COPD, systolic CHF/NICM with recovered EF, prostate cancer s/p prostatectomy, thyroid cancer s/p thyroidectomy, hypothyroidism, HTN, HLD and GERD presenting with ongoing productive cough and shortness of breath for over a week that did not improve despite outpatient treatment with Z-Pak and prednisone by his pulmonologist, and admitted for COPD exacerbation.  Work-up in ED including basic labs, BNP and full RVP unrevealing.  CTA chest negative for PE but bronchial wall thickening with mild atelectasis, infiltrate or scarring in the medial aspect of upper lobes bilaterally, stable bilateral pulmonary nodules measuring up to 6 mm and coronary artery calcification.  Patient was started on Solu-Medrol, azithromycin and scheduled DuoNebs, and admitted. ? ?

## 2021-12-28 NOTE — Assessment & Plan Note (Addendum)
Presents with persistent productive cough and SOB despite outpatient treatment with p.o. prednisone and azithromycin.  Reports good compliance with this Trelegy Ellipta.  Administer pollen exposure and smoke exposure from yardwork.  COVID-19, influenza, full RVP and blood cultures negative.  CTA chest raises concern for bronchitis and infiltrates.  BNP within normal.  Still with productive cough, SOB and chest pain from coughing.  Rhonchorous on exam. ?-Continue IV Solu-Medrol, azithromycin, scheduled DuoNeb and home inhaler ?-Wean oxygen as able.  Minimum oxygen to keep saturation above 88%. ?-Check procalcitonin.  If elevated, will add ceftriaxone to cover for CAP ?-May consider changing Coreg to [facility] for better beta-1 selectivity. ?-Incentive spirometry, OOB and ambulatory saturation ?

## 2021-12-28 NOTE — Assessment & Plan Note (Signed)
TTE in 12/2020 with LVEF of 50 to 55% (35 to 40% a year prior), G1-DD.  Patient is not on diuretics.  On Coreg and Entresto for GDMT.  Appears euvolemic on exam.  BNP within normal. ?-Closely monitor fluid status ?

## 2021-12-28 NOTE — ED Notes (Signed)
This RN acknowledges orders to ambulate patient and attempt to wean to room air. However, RN noted that while assisting pt with linen change, pt was short of breath with standing and walking in room. Will notify oncoming RN of the same.  ?

## 2021-12-28 NOTE — Assessment & Plan Note (Signed)
-   Continue home statin °

## 2021-12-28 NOTE — Assessment & Plan Note (Signed)
Ruled out.  No documented desaturation.  Not on oxygen at home. ?-Check ambulatory saturation ?

## 2021-12-28 NOTE — ED Notes (Signed)
Pt provided clean linen, call light in reach, and is setting up right on the side of his bed eating his breakfast  ?

## 2021-12-28 NOTE — Assessment & Plan Note (Signed)
-   Continue home Synthroid °

## 2021-12-28 NOTE — ED Notes (Addendum)
This tech ambulated pt from room to hall way. Pt 02 dropped from 96 on RA to 92 on RA while ambulating. Pt voice concerns to stop for a while before returning back to his room. No other complaints was made.  ?

## 2021-12-28 NOTE — Assessment & Plan Note (Signed)
BP slightly elevated. ?-Continue home cardiac meds including Coreg and Entresto. ?

## 2021-12-29 ENCOUNTER — Inpatient Hospital Stay (HOSPITAL_COMMUNITY): Payer: PPO

## 2021-12-29 DIAGNOSIS — J441 Chronic obstructive pulmonary disease with (acute) exacerbation: Secondary | ICD-10-CM | POA: Diagnosis not present

## 2021-12-29 LAB — CBC
HCT: 38.4 % — ABNORMAL LOW (ref 39.0–52.0)
Hemoglobin: 12.3 g/dL — ABNORMAL LOW (ref 13.0–17.0)
MCH: 28.3 pg (ref 26.0–34.0)
MCHC: 32 g/dL (ref 30.0–36.0)
MCV: 88.3 fL (ref 80.0–100.0)
Platelets: 269 10*3/uL (ref 150–400)
RBC: 4.35 MIL/uL (ref 4.22–5.81)
RDW: 13.5 % (ref 11.5–15.5)
WBC: 18.6 10*3/uL — ABNORMAL HIGH (ref 4.0–10.5)
nRBC: 0 % (ref 0.0–0.2)

## 2021-12-29 LAB — RENAL FUNCTION PANEL
Albumin: 2.9 g/dL — ABNORMAL LOW (ref 3.5–5.0)
Anion gap: 7 (ref 5–15)
BUN: 27 mg/dL — ABNORMAL HIGH (ref 8–23)
CO2: 30 mmol/L (ref 22–32)
Calcium: 8.8 mg/dL — ABNORMAL LOW (ref 8.9–10.3)
Chloride: 100 mmol/L (ref 98–111)
Creatinine, Ser: 0.97 mg/dL (ref 0.61–1.24)
GFR, Estimated: >60 mL/min (ref >60)
Glucose, Bld: 138 mg/dL — ABNORMAL HIGH (ref 70–99)
Phosphorus: 4.2 mg/dL (ref 2.5–4.6)
Potassium: 4.4 mmol/L (ref 3.5–5.1)
Sodium: 137 mmol/L (ref 135–145)

## 2021-12-29 LAB — MAGNESIUM: Magnesium: 2.3 mg/dL (ref 1.7–2.4)

## 2021-12-29 LAB — PROCALCITONIN: Procalcitonin: <0.1 ng/mL

## 2021-12-29 LAB — BRAIN NATRIURETIC PEPTIDE: B Natriuretic Peptide: 42.7 pg/mL (ref 0.0–100.0)

## 2021-12-29 MED ORDER — GUAIFENESIN-DM 100-10 MG/5ML PO SYRP
5.0000 mL | ORAL_SOLUTION | ORAL | Status: DC | PRN
  Administered 2021-12-29 – 2022-01-02 (×9): 5 mL via ORAL
  Filled 2021-12-29 (×9): qty 5

## 2021-12-29 MED ORDER — LEVOFLOXACIN 500 MG PO TABS
500.0000 mg | ORAL_TABLET | Freq: Every day | ORAL | Status: AC
  Administered 2021-12-29 – 2022-01-01 (×4): 500 mg via ORAL
  Filled 2021-12-29 (×4): qty 1

## 2021-12-29 MED ORDER — METHYLPREDNISOLONE SODIUM SUCC 125 MG IJ SOLR
60.0000 mg | Freq: Two times a day (BID) | INTRAMUSCULAR | Status: DC
  Administered 2021-12-29 – 2022-01-01 (×6): 60 mg via INTRAVENOUS
  Filled 2021-12-29 (×6): qty 2

## 2021-12-29 NOTE — Plan of Care (Signed)
?  Problem: Activity: ?Goal: Ability to tolerate increased activity will improve ?Outcome: Progressing ?  ?Problem: Respiratory: ?Goal: Ability to maintain a clear airway will improve ?Outcome: Progressing ?Goal: Levels of oxygenation will improve ?Outcome: Progressing ?Goal: Ability to maintain adequate ventilation will improve ?Outcome: Progressing ?  ?Problem: Respiratory: ?Goal: Levels of oxygenation will improve ?Outcome: Progressing ?  ?Problem: Respiratory: ?Goal: Ability to maintain adequate ventilation will improve ?Outcome: Progressing ?  ?

## 2021-12-29 NOTE — Progress Notes (Signed)
?                                  PROGRESS NOTE                                             ?                                                                                                                     ?                                         ? ? Patient Demographics:  ? ? Arthur [name], is a 81 y.o. male, DOB - 17-Jan-1941, KGU:[id number] ? ?Outpatient Primary MD for the patient is [name], MD    LOS - 1  Admit date - 12/27/2021   ? ?Chief Complaint  ?Patient presents with  ? Shortness of Breath  ? Cough  ?    ? ?Brief Narrative (HPI from H&P)    81 year old M with PMH of COPD, systolic CHF/NICM with recovered EF, prostate cancer s/p prostatectomy, thyroid cancer s/p thyroidectomy, hypothyroidism, HTN, HLD and GERD presenting with ongoing productive cough and shortness of breath for over a week that did not improve despite outpatient treatment with Z-Pak and prednisone by his pulmonologist, and admitted for COPD exacerbation ? ? Subjective:  ? ? Arthur [name] today has, No headache, No chest pain, No abdominal pain - No Nausea, No new weakness tingling or numbness, still has cough and shortness of breath. ? ? Assessment  & Plan :  ? ?Acute Hypoxic Resp. Failure due to Acute on chronic COPD exacerbation - he is already failed outpatient azithromycin and prednisone treatment, he is now on IV Solu-Medrol will switch azithromycin to Levaquin for 4 doses as he has already failed azithromycin.  CTA chest reassuring, continue supportive care with nebulizer treatments and inhalers. Encouraged the patient to sit up in chair in the daytime use I-S and flutter valve for pulmonary toiletry.  Will advance activity and titrate down oxygen as possible. ? ?2.  Chronic diastolic CHF.  EF now 50% this year.  Continue combination of Coreg and Entresto.  As needed diuretics as needed. ? ?3.  GERD.  PPI. ? ?4.  Dyslipidemia.  On statin. ? ?5.  Hypothyroidism.  On Synthroid continue. ? ?6.   Reactionary leukocytosis due to combination of COPD exacerbation and steroid use.  Monitor afebrile, stable procalcitonin. ? ?SpO2: 94 % ?O2 Flow Rate (L/min): 2 L/min ?  ? ?   ? ?Condition - Fair ? ?Family Communication  :  None present ? ?Code Status :  Full ? ?Consults  :  None ? ?PUD Prophylaxis :  PPI ? ? Procedures  :    ? ?CTA - 1. No evidence of pulmonary embolism. 2. Bronchial wall thickening bilaterally with mild atelectasis, infiltrate or scarring in the medial aspects the upper lobes bilaterally, which may be infectious or inflammatory. 3. Stable bilateral pulmonary nodules measuring up to 6 mm, unchanged from 2021. 4. Aortic atherosclerosis. 5. Coronary artery calcifications.  ? ?   ? ?Disposition Plan  :   ? ?Status is: Inpatient ? ?DVT Prophylaxis  :   ? ?enoxaparin (LOVENOX) injection 40 mg Start: 12/27/21 2000 ? ?Lab Results  ?Component Value Date  ? PLT 269 12/29/2021  ? ? ?Diet :  ?Diet Order   ? ?       ?  Diet Heart Room service appropriate? Yes; Fluid consistency: Thin  Diet effective now       ?  ? ?  ?  ? ?  ?  ? ?Inpatient Medications ? ?Scheduled Meds: ? AeroChamber Plus Flo-Vu Large  1 each Other Once  ? atorvastatin  40 mg Oral Daily  ? azithromycin  250 mg Oral Daily  ? carvedilol  6.25 mg Oral BID WC  ? enoxaparin (LOVENOX) injection  40 mg Subcutaneous Q24H  ? fluticasone furoate-vilanterol  1 puff Inhalation Daily  ? And  ? umeclidinium bromide  1 puff Inhalation Daily  ? ipratropium-albuterol  3 mL Nebulization QID  ? levothyroxine  200 mcg Oral QAC breakfast  ? methylPREDNISolone (SOLU-MEDROL) injection  60 mg Intravenous Q12H  ? pantoprazole  40 mg Oral Daily  ? sacubitril-valsartan  1 tablet Oral BID  ? sodium chloride flush  3 mL Intravenous Q12H  ? ?Continuous Infusions: ?PRN Meds:.acetaminophen **OR** acetaminophen, albuterol, guaiFENesin-dextromethorphan, polyethylene glycol ? ?Antibiotics  :   ? ?Anti-infectives (From admission, onward)  ? ? Start     Dose/Rate Route Frequency  Ordered Stop  ? 12/28/21 1000  azithromycin (ZITHROMAX) tablet 250 mg       ? 250 mg Oral Daily 12/27/21 1905    ? ?  ? ? ? Time Spent in minutes  30 ? ? ?Lala Lund M.D on 12/29/2021 at 10:14 AM ? ?To page go to [email or web address]  ? ?[facility] -  Office  [phone] ? ?See all Orders from today for further details ? ? ? Objective:  ? ?Vitals:  ? 12/29/21 0400 12/29/21 0500 12/29/21 0813 12/29/21 0824  ?BP: (!) 154/83   128/65  ?Pulse: (!) 52     ?Resp: '15  20 20  '$ ?Temp: (!) 97.5 ?F (36.4 ?C)   97.8 ?F (36.6 ?C)  ?TempSrc: Oral   Oral  ?SpO2: 94%     ?Weight:  109.5 kg    ? ? ?Wt Readings from Last 3 Encounters:  ?12/29/21 109.5 kg  ?12/22/21 111.6 kg  ?12/14/21 113 kg  ? ? ? ?Intake/Output Summary (Last 24 hours) at 12/29/2021 1014 ?Last data filed at 12/29/2021 0500 ?Gross per 24 hour  ?Intake 360 ml  ?Output 800 ml  ?Net -440 ml  ? ? ? ?Physical Exam ? ?Awake Alert, No new F.N deficits, Normal affect ?[place].AT,PERRAL ?Supple Neck, No JVD,   ?Symmetrical Chest wall movement, Mod air movement bilaterally, some wheezing ?RRR,No Gallops,Rubs or new Murmurs,  ?+ve B.Sounds, Abd Soft, No tenderness,   ?No Cyanosis, Clubbing or edema  ?  ? ? Data Review:  ? ? ?CBC ?Recent Labs  ?Lab 12/27/21 ?1633 12/28/21 ?0425 12/29/21 ?0246  ?WBC 16.5* 14.1* 18.6*  ?HGB 13.3 13.8 12.3*  ?HCT  42.3 44.3 38.4*  ?PLT 272 313 269  ?MCV 91.0 89.7 88.3  ?MCH 28.6 27.9 28.3  ?MCHC 31.4 31.2 32.0  ?RDW 13.6 13.5 13.5  ?LYMPHSABS 1.2  --   --   ?MONOABS 0.6  --   --   ?EOSABS 0.0  --   --   ?BASOSABS 0.0  --   --   ? ? ?Electrolytes ?Recent Labs  ?Lab 12/27/21 ?1633 12/28/21 ?0425 12/28/21 ?1612 12/29/21 ?0246  ?NA 139 138  --  137  ?K 4.2 4.1  --  4.4  ?CL 104 98  --  100  ?CO2 27 29  --  30  ?GLUCOSE 137* 149*  --  138*  ?BUN 19 21  --  27*  ?CREATININE 1.03 0.98  --  0.97  ?CALCIUM 8.9 9.2  --  8.8*  ?AST 17 21  --   --   ?ALT 24 26  --   --   ?ALKPHOS 84 92  --   --   ?BILITOT 0.4 0.6  --   --   ?ALBUMIN 3.4* 3.4*  --  2.9*  ?MG  --    --   --  2.3  ?PROCALCITON  --   --  <0.10 <0.10  ?LATICACIDVEN 1.7  --   --   --   ?BNP 59.2  --   --  42.7  ? ? ?------------------------------------------------------------------------------------------------------------------ ?No results for input(s): CHOL, HDL, LDLCALC, TRIG, CHOLHDL, LDLDIRECT in the last 72 hours. ? ?Lab Results  ?Component Value Date  ? HGBA1C 5.5 03/30/2017  ? ? ?No results for input(s): TSH, T4TOTAL, T3FREE, THYROIDAB in the last 72 hours. ? ?Invalid input(s): FREET3 ?------------------------------------------------------------------------------------------------------------------ ?ID Labs ?Recent Labs  ?Lab 12/27/21 ?1633 12/28/21 ?0425 12/28/21 ?1612 12/29/21 ?0246  ?WBC 16.5* 14.1*  --  18.6*  ?PLT 272 313  --  269  ?PROCALCITON  --   --  <0.10 <0.10  ?LATICACIDVEN 1.7  --   --   --   ?CREATININE 1.03 0.98  --  0.97  ? ?Cardiac Enzymes ?No results for input(s): CKMB, TROPONINI, MYOGLOBIN in the last 168 hours. ? ?Invalid input(s): CK ? ? ?Radiology Reports ?DG Chest 2 View ? ?Result Date: 12/27/2021 ?CLINICAL DATA:  Cough, congestion, short of breath for 1 week EXAM: CHEST - 2 VIEW COMPARISON:  12/22/2021 FINDINGS: Frontal and lateral views of the chest demonstrate a stable cardiac silhouette. Stable hyperinflation and background scarring consistent with emphysema. No acute airspace disease, effusion, or pneumothorax. Stable postsurgical changes from thyroidectomy and lower cervical fusion. No acute bony abnormalities. IMPRESSION: 1. Stable emphysema.  No acute process. Electronically Signed   By: Randa Ngo M.D.   On: 12/27/2021 15:27  ? ?CT Angio Chest PE W and/or Wo Contrast ? ?Result Date: 12/27/2021 ?CLINICAL DATA:  Chest pain or shortness of breath, pleurisy or effusion suspected. EXAM: CT ANGIOGRAPHY CHEST WITH CONTRAST TECHNIQUE: Multidetector CT imaging of the chest was performed using the standard protocol during bolus administration of intravenous contrast. Multiplanar CT  image reconstructions and MIPs were obtained to evaluate the vascular anatomy. RADIATION DOSE REDUCTION: This exam was performed according to the departmental dose-optimization program which includes

## 2021-12-29 NOTE — Evaluation (Addendum)
Physical Therapy Evaluation ?Patient Details ?Name: Arthur [name]. ?MRN: [id number] ?DOB: 11/10/1940 ?Today's Date: 12/29/2021 ? ?History of Present Illness ? Pt adm 3/27 with acute on chronic respiratory failure likely due to copd exacerbation. PMH chf, htn, prostate Ca, COPD, lt TKR, rt TKR  ?Clinical Impression ? Pt presents to PT with slightly unsteady gait due to illness and inactivity. Expect pt will make good progress back to baseline with mobility. Will follow acutely but doubt pt will need PT after DC. Currently using supplemental O2 which he does not use at baseline. Hopefully will improve and not require home O2. Notified by nurse after pt already up that he had an abnormal EKG so did not increase amb distance. No c/o's by pt. ?   ?   ? ?Recommendations for follow up therapy are one component of a multi-disciplinary discharge planning process, led by the attending physician.  Recommendations may be updated based on patient status, additional functional criteria and insurance authorization. ? ?Follow Up Recommendations No PT follow up ? ?  ?Assistance Recommended at Discharge None  ?Patient can return home with the following ?   ? ?  ?Equipment Recommendations None recommended by PT  ?Recommendations for Other Services ?    ?  ?Functional Status Assessment Patient has had a recent decline in their functional status and demonstrates the ability to make significant improvements in function in a reasonable and predictable amount of time.  ? ?  ?Precautions / Restrictions Precautions ?Precautions: Other (comment) ?Precaution Comments: watch SpO2  ? ?  ? ?Mobility ? Bed Mobility ?Overal bed mobility: Modified Independent ?  ?  ?  ?  ?  ?  ?  ?  ? ?Transfers ?Overall transfer level: Modified independent ?Equipment used: None ?  ?  ?  ?  ?  ?  ?  ?  ?  ? ?Ambulation/Gait ?Ambulation/Gait assistance: Supervision ?Gait Distance (Feet): 80 Feet ?Assistive device: None ?Gait Pattern/deviations: Decreased stride  length, Drifts right/left ?Gait velocity: decr ?Gait velocity interpretation: 1.31 - 2.62 ft/sec, indicative of limited community ambulator ?  ?General Gait Details: Pt with slight drift with gait but no loss of balance ? ?Stairs ?  ?  ?  ?  ?  ? ?Wheelchair Mobility ?  ? ?Modified Rankin (Stroke Patients Only) ?  ? ?  ? ?Balance Overall balance assessment: Mild deficits observed, not formally tested ?  ?  ?  ?  ?  ?  ?  ?  ?  ?  ?  ?  ?  ?  ?  ?  ?  ?  ?   ? ? ? ?Pertinent Vitals/Pain Pain Assessment ?Pain Assessment: No/denies pain  ? ? ?Home Living Family/patient expects to be discharged to:: Private residence ?Living Arrangements: Alone ?Available Help at Discharge: Friend(s) ?Type of Home: House ?Home Access: Stairs to enter ?Entrance Stairs-Rails: Right;Left ?Entrance Stairs-Number of Steps: 3 ?  ?Home Layout: One level ?Home Equipment: Conservation officer, nature (2 wheels);Cane - single point;BSC/3in1;Shower seat - built in ?   ?  ?Prior Function Prior Level of Function : Independent/Modified Independent;Driving;Working/employed ?  ?  ?  ?  ?  ?  ?Mobility Comments: Pt works on other people's land (clearing brush, etc) ?  ?  ? ? ?Hand Dominance  ? Dominant Hand: Right ? ?  ?Extremity/Trunk Assessment  ? Upper Extremity Assessment ?Upper Extremity Assessment: Defer to OT evaluation ?  ? ?Lower Extremity Assessment ?Lower Extremity Assessment: Overall WFL for tasks assessed ?  ? ?   ?  Communication  ? Communication: HOH  ?Cognition Arousal/Alertness: Awake/alert ?Behavior During Therapy: [facility] for tasks assessed/performed ?Overall Cognitive Status: Within Functional Limits for tasks assessed ?  ?  ?  ?  ?  ?  ?  ?  ?  ?  ?  ?  ?  ?  ?  ?  ?  ?  ?  ? ?  ?General Comments General comments (skin integrity, edema, etc.): At rest pt on 2L O2 with SpO2 93%. Amb on 1L with SpO2 89%. ? ?  ?Exercises    ? ?Assessment/Plan  ?  ?PT Assessment Patient needs continued PT services  ?PT Problem List Decreased mobility;Cardiopulmonary status  limiting activity ? ?   ?  ?PT Treatment Interventions Gait training;Functional mobility training;Therapeutic activities;Therapeutic exercise;Patient/family education   ? ?PT Goals (Current goals can be found in the Care Plan section)  ?Acute Rehab PT Goals ?Patient Stated Goal: return home ?PT Goal Formulation: With patient ?Time For Goal Achievement: 01/05/22 ?Potential to Achieve Goals: Good ? ?  ?Frequency Min 3X/week ?  ? ? ?Co-evaluation   ?  ?  ?  ?  ? ? ?  ?AM-PAC PT "6 Clicks" Mobility  ?Outcome Measure Help needed turning from your back to your side while in a flat bed without using bedrails?: None ?Help needed moving from lying on your back to sitting on the side of a flat bed without using bedrails?: None ?Help needed moving to and from a bed to a chair (including a wheelchair)?: A Little ?Help needed standing up from a chair using your arms (e.g., wheelchair or bedside chair)?: None ?Help needed to walk in hospital room?: A Little ?Help needed climbing 3-5 steps with a railing? : A Little ?6 Click Score: 21 ? ?  ?End of Session Equipment Utilized During Treatment: Oxygen ?Activity Tolerance: Patient tolerated treatment well ?Patient left: in bed;with call bell/phone within reach;with bed alarm set ?Nurse Communication: Mobility status ?PT Visit Diagnosis: Other abnormalities of gait and mobility (R26.89) ?  ? ?Time: 0630-1601 ?PT Time Calculation (min) (ACUTE ONLY): 12 min ? ? ?Charges:   PT Evaluation ?$PT Eval Low Complexity: 1 Low ?  ?  ?   ? ? ?[facility] PT ?Acute Rehabilitation Services ?Pager [phone] ?Office [phone] ? ? ?Shary Decamp [facility] ?12/29/2021, 10:33 AM ? ?

## 2021-12-29 NOTE — Evaluation (Signed)
Occupational Therapy Evaluation ?Patient Details ?Name: Arthur [name]. ?MRN: [id number] ?DOB: July 09, 1941 ?Today's Date: 12/29/2021 ? ? ?History of Present Illness Pt adm 3/27 with acute on chronic respiratory failure likely due to copd exacerbation. PMH chf, htn, prostate Ca, COPD, lt TKR, rt TKR  ? ?Clinical Impression ?  ?Pt currently presents with modified independence for selfcare tasks and functional transfers.  His O2 sats remained greater than 90% on 3Ls nasal cannula throughout transfer to the bedside chair as well as with simulated selfcare tasks.  Feel he does not need any further acute or post acute OT needs at this time. Provided energy conservation strategies for reference at home.   ?   ? ?Recommendations for follow up therapy are one component of a multi-disciplinary discharge planning process, led by the attending physician.  Recommendations may be updated based on patient status, additional functional criteria and insurance authorization.  ? ?Follow Up Recommendations ? No OT follow up  ?  ?   ?   ?   ?Equipment Recommendations ? None recommended by OT  ?  ?   ?Precautions / Restrictions Precautions ?Precautions: Other (comment) ?Precaution Comments: watch SpO2 ?Restrictions ?Weight Bearing Restrictions: No  ? ?  ? ?Mobility Bed Mobility ?Overal bed mobility: Modified Independent ?  ?  ?  ?  ?  ?  ?  ?  ? ?Transfers ?Overall transfer level: Modified independent ?Equipment used: None ?  ?  ?  ?  ?  ?  ?  ?  ?  ? ?  ?Balance Overall balance assessment: Modified Independent ?  ?  ?  ?  ?  ?  ?  ?  ?  ?  ?  ?  ?  ?  ?  ?  ?  ?  ?   ? ?ADL either performed or assessed with clinical judgement  ? ?ADL Overall ADL's : Modified independent ?  ?  ?  ?  ?  ?  ?  ?  ?  ?  ?  ?  ?  ?  ?  ?  ?  ?  ?  ?General ADL Comments: Pt overall  modified independent with simulated selfcare tasks and functional transfers.  Still with SOB and on 3Ls nasal cannula with O2 sats staying around 90% or greater throughout.   Issued Medical illustrator.  ? ? ? ?Vision Baseline Vision/History: 0 No visual deficits ?Ability to See in Adequate Light: 0 Adequate ?Patient Visual Report: No change from baseline ?Vision Assessment?: No apparent visual deficits  ?   ?Perception Perception ?Perception: Within Functional Limits ?  ?Praxis Praxis ?Praxis: Intact ?  ? ?Pertinent Vitals/Pain Pain Assessment ?Pain Assessment: 0-10 ?Pain Score: 0-No pain ?Pain Descriptors / Indicators: Discomfort  ? ? ? ?Hand Dominance   ?  ?Extremity/Trunk Assessment Upper Extremity Assessment ?Upper Extremity Assessment: Overall WFL for tasks assessed ?  ?Lower Extremity Assessment ?Lower Extremity Assessment: Defer to PT evaluation ?  ?Cervical / Trunk Assessment ?Cervical / Trunk Assessment: Normal ?  ?Communication   ?  ?Cognition Arousal/Alertness: Awake/alert ?Behavior During Therapy: [facility] for tasks assessed/performed ?Overall Cognitive Status: Within Functional Limits for tasks assessed ?  ?  ?  ?  ?  ?  ?  ?  ?  ?  ?  ?  ?  ?  ?  ?  ?  ?  ?  ?   ?   ?   ? ? ?   ?   ? ?  ?  ?   ?   ?   ?   ?   ? ?   ?  AM-PAC OT "6 Clicks" Daily Activity     ?Outcome Measure Help from another person eating meals?: None ?Help from another person taking care of personal grooming?: None ?Help from another person toileting, which includes using toliet, bedpan, or urinal?: None ?Help from another person bathing (including washing, rinsing, drying)?: None ?Help from another person to put on and taking off regular upper body clothing?: None ?Help from another person to put on and taking off regular lower body clothing?: None ?6 Click Score: 24 ?  ?End of Session Equipment Utilized During Treatment: Oxygen ?Nurse Communication: Mobility status ? ?Activity Tolerance: Patient tolerated treatment well ?Patient left: in chair;with call bell/phone within reach;with chair alarm set ? ?   ?              ?Time: 0109-3235 ?OT Time Calculation (min): 34 min ?Charges:  OT Evaluation ?$OT  Eval Low Complexity: 1 Low ?OT Treatments ?$Self Care/Home Management : 8-22 mins ? ?Elantra Caprara,Terry OTR/L ?12/29/2021, 3:00 PM ?

## 2021-12-30 DIAGNOSIS — J441 Chronic obstructive pulmonary disease with (acute) exacerbation: Secondary | ICD-10-CM | POA: Diagnosis not present

## 2021-12-30 LAB — CBC WITH DIFFERENTIAL/PLATELET
Abs Immature Granulocytes: 0.17 10*3/uL — ABNORMAL HIGH (ref 0.00–0.07)
Basophils Absolute: 0 10*3/uL (ref 0.0–0.1)
Basophils Relative: 0 %
Eosinophils Absolute: 0 10*3/uL (ref 0.0–0.5)
Eosinophils Relative: 0 %
HCT: 40.3 % (ref 39.0–52.0)
Hemoglobin: 13.1 g/dL (ref 13.0–17.0)
Immature Granulocytes: 1 %
Lymphocytes Relative: 7 %
Lymphs Abs: 1.2 10*3/uL (ref 0.7–4.0)
MCH: 28.7 pg (ref 26.0–34.0)
MCHC: 32.5 g/dL (ref 30.0–36.0)
MCV: 88.4 fL (ref 80.0–100.0)
Monocytes Absolute: 0.3 10*3/uL (ref 0.1–1.0)
Monocytes Relative: 2 %
Neutro Abs: 16.1 10*3/uL — ABNORMAL HIGH (ref 1.7–7.7)
Neutrophils Relative %: 90 %
Platelets: 282 10*3/uL (ref 150–400)
RBC: 4.56 MIL/uL (ref 4.22–5.81)
RDW: 13.4 % (ref 11.5–15.5)
WBC: 17.8 10*3/uL — ABNORMAL HIGH (ref 4.0–10.5)
nRBC: 0 % (ref 0.0–0.2)

## 2021-12-30 LAB — COMPREHENSIVE METABOLIC PANEL
ALT: 24 U/L (ref 0–44)
AST: 14 U/L — ABNORMAL LOW (ref 15–41)
Albumin: 2.9 g/dL — ABNORMAL LOW (ref 3.5–5.0)
Alkaline Phosphatase: 78 U/L (ref 38–126)
Anion gap: 10 (ref 5–15)
BUN: 35 mg/dL — ABNORMAL HIGH (ref 8–23)
CO2: 28 mmol/L (ref 22–32)
Calcium: 8.6 mg/dL — ABNORMAL LOW (ref 8.9–10.3)
Chloride: 98 mmol/L (ref 98–111)
Creatinine, Ser: 1.2 mg/dL (ref 0.61–1.24)
GFR, Estimated: >60 mL/min (ref >60)
Glucose, Bld: 184 mg/dL — ABNORMAL HIGH (ref 70–99)
Potassium: 4.9 mmol/L (ref 3.5–5.1)
Sodium: 136 mmol/L (ref 135–145)
Total Bilirubin: 0.7 mg/dL (ref 0.3–1.2)
Total Protein: 6.4 g/dL — ABNORMAL LOW (ref 6.5–8.1)

## 2021-12-30 LAB — MAGNESIUM: Magnesium: 2.2 mg/dL (ref 1.7–2.4)

## 2021-12-30 LAB — PROCALCITONIN: Procalcitonin: <0.1 ng/mL

## 2021-12-30 LAB — BRAIN NATRIURETIC PEPTIDE: B Natriuretic Peptide: 26.4 pg/mL (ref 0.0–100.0)

## 2021-12-30 MED ORDER — IPRATROPIUM-ALBUTEROL 0.5-2.5 (3) MG/3ML IN SOLN
3.0000 mL | Freq: Two times a day (BID) | RESPIRATORY_TRACT | Status: DC
  Administered 2021-12-30 – 2022-01-01 (×4): 3 mL via RESPIRATORY_TRACT
  Filled 2021-12-30 (×4): qty 3

## 2021-12-30 NOTE — Progress Notes (Signed)
?                                  PROGRESS NOTE                                             ?                                                                                                                     ?                                         ? ? Patient Demographics:  ? ? Arthur [name], is a 81 y.o. male, DOB - 03-Jun-1941, KZS:[id number] ? ?Outpatient Primary MD for the patient is [name], MD    LOS - 2  Admit date - 12/27/2021   ? ?Chief Complaint  ?Patient presents with  ? Shortness of Breath  ? Cough  ?    ? ?Brief Narrative (HPI from H&P)    81 year old M with PMH of COPD, systolic CHF/NICM with recovered EF, prostate cancer s/p prostatectomy, thyroid cancer s/p thyroidectomy, hypothyroidism, HTN, HLD and GERD presenting with ongoing productive cough and shortness of breath for over a week that did not improve despite outpatient treatment with Z-Pak and prednisone by his pulmonologist, and admitted for COPD exacerbation ? ? Subjective:  ? ?Patient in bed, appears comfortable, denies any headache, no fever, no chest pain or pressure, improved shortness of breath , no abdominal pain. No new focal weakness. ? ? Assessment  & Plan :  ? ?Acute Hypoxic Resp. Failure due to Acute on chronic COPD exacerbation - he is already failed outpatient azithromycin and prednisone treatment, he is now on IV Solu-Medrol will switch azithromycin to Levaquin for 4 doses as he has already failed azithromycin.  CTA chest reassuring, continue supportive care with nebulizer treatments and inhalers. Encouraged the patient to sit up in chair in the daytime use I-S and flutter valve for pulmonary toiletry.  Will advance activity and titrate down oxygen as possible. ? ?2.  Chronic diastolic CHF.  EF now 50% this year.  Continue combination of Coreg and Entresto.  As needed diuretics as needed. ? ?3.  GERD.  PPI. ? ?4.  Dyslipidemia.  On statin. ? ?5.  Hypothyroidism.  On Synthroid  continue. ? ?6.  Reactionary leukocytosis due to combination of COPD exacerbation and steroid use.  Monitor afebrile, stable procalcitonin. ? ?SpO2: 92 % ?O2 Flow Rate (L/min): 2 L/min ?  ? ?   ? ?Condition - Fair ? ?Family Communication  :  None present ? ?Code Status :  Full ? ?Consults  :  None ? ?PUD Prophylaxis : PPI ? ?  Procedures  :    ? ?CTA - 1. No evidence of pulmonary embolism. 2. Bronchial wall thickening bilaterally with mild atelectasis, infiltrate or scarring in the medial aspects the upper lobes bilaterally, which may be infectious or inflammatory. 3. Stable bilateral pulmonary nodules measuring up to 6 mm, unchanged from 2021. 4. Aortic atherosclerosis. 5. Coronary artery calcifications.  ? ?   ? ?Disposition Plan  :   ? ?Status is: Inpatient ? ?DVT Prophylaxis  :   ? ?enoxaparin (LOVENOX) injection 40 mg Start: 12/27/21 2000 ? ?Lab Results  ?Component Value Date  ? PLT 282 12/30/2021  ? ? ?Diet :  ?Diet Order   ? ?       ?  Diet Heart Room service appropriate? Yes; Fluid consistency: Thin  Diet effective now       ?  ? ?  ?  ? ?  ?  ? ?Inpatient Medications ? ?Scheduled Meds: ? AeroChamber Plus Flo-Vu Large  1 each Other Once  ? atorvastatin  40 mg Oral Daily  ? carvedilol  6.25 mg Oral BID WC  ? enoxaparin (LOVENOX) injection  40 mg Subcutaneous Q24H  ? fluticasone furoate-vilanterol  1 puff Inhalation Daily  ? And  ? umeclidinium bromide  1 puff Inhalation Daily  ? ipratropium-albuterol  3 mL Nebulization QID  ? levofloxacin  500 mg Oral Daily  ? levothyroxine  200 mcg Oral QAC breakfast  ? methylPREDNISolone (SOLU-MEDROL) injection  60 mg Intravenous Q12H  ? pantoprazole  40 mg Oral Daily  ? sacubitril-valsartan  1 tablet Oral BID  ? sodium chloride flush  3 mL Intravenous Q12H  ? ?Continuous Infusions: ?PRN Meds:.acetaminophen **OR** acetaminophen, albuterol, guaiFENesin-dextromethorphan, polyethylene glycol ? ?Antibiotics  :   ? ?Anti-infectives (From admission, onward)  ? ? Start      Dose/Rate Route Frequency Ordered Stop  ? 12/29/21 1115  levofloxacin (LEVAQUIN) tablet 500 mg       ? 500 mg Oral Daily 12/29/21 1017 01/02/22 0959  ? 12/28/21 1000  azithromycin (ZITHROMAX) tablet 250 mg  Status:  Discontinued       ? 250 mg Oral Daily 12/27/21 1905 12/29/21 1017  ? ?  ? ? ? Time Spent in minutes  30 ? ? ?Lala Lund M.D on 12/30/2021 at 10:43 AM ? ?To page go to [email or web address]  ? ?[facility] -  Office  [phone] ? ?See all Orders from today for further details ? ? ? Objective:  ? ?Vitals:  ? 12/30/21 0747 12/30/21 0751 12/30/21 0800 12/30/21 0900  ?BP: (!) 143/89  133/68   ?Pulse: 65  68 77  ?Resp: '19  12 20  '$ ?Temp: 97.6 ?F (36.4 ?C)     ?TempSrc: Oral     ?SpO2: 91% 92% 95% 92%  ?Weight:      ?Height:      ? ? ?Wt Readings from Last 3 Encounters:  ?12/30/21 109.4 kg  ?12/22/21 111.6 kg  ?12/14/21 113 kg  ? ? ? ?Intake/Output Summary (Last 24 hours) at 12/30/2021 1043 ?Last data filed at 12/30/2021 0900 ?Gross per 24 hour  ?Intake 240 ml  ?Output 150 ml  ?Net 90 ml  ? ? ? ?Physical Exam ? ?Awake Alert, No new F.N deficits, Normal affect ?[place].AT,PERRAL ?Supple Neck, No JVD,   ?Symmetrical Chest wall movement, Good air movement bilaterally, mild wheezing ?RRR,No Gallops, Rubs or new Murmurs,  ?+ve B.Sounds, Abd Soft, No tenderness,   ?No Cyanosis, Clubbing or edema  ?  ? ? Data  Review:  ? ? ?CBC ?Recent Labs  ?Lab 12/27/21 ?1633 12/28/21 ?0425 12/29/21 ?0246 12/30/21 ?6314  ?WBC 16.5* 14.1* 18.6* 17.8*  ?HGB 13.3 13.8 12.3* 13.1  ?HCT 42.3 44.3 38.4* 40.3  ?PLT 272 313 269 282  ?MCV 91.0 89.7 88.3 88.4  ?MCH 28.6 27.9 28.3 28.7  ?MCHC 31.4 31.2 32.0 32.5  ?RDW 13.6 13.5 13.5 13.4  ?LYMPHSABS 1.2  --   --  1.2  ?MONOABS 0.6  --   --  0.3  ?EOSABS 0.0  --   --  0.0  ?BASOSABS 0.0  --   --  0.0  ? ? ?Electrolytes ?Recent Labs  ?Lab 12/27/21 ?1633 12/28/21 ?0425 12/28/21 ?1612 12/29/21 ?0246 12/30/21 ?9702  ?NA 139 138  --  137 136  ?K 4.2 4.1  --  4.4 4.9  ?CL 104 98  --  100 98  ?CO2 27 29   --  30 28  ?GLUCOSE 137* 149*  --  138* 184*  ?BUN 19 21  --  27* 35*  ?CREATININE 1.03 0.98  --  0.97 1.20  ?CALCIUM 8.9 9.2  --  8.8* 8.6*  ?AST 17 21  --   --  14*  ?ALT 24 26  --   --  24  ?ALKPHOS 84 92  --   --  78  ?BILITOT 0.4 0.6  --   --  0.7  ?ALBUMIN 3.4* 3.4*  --  2.9* 2.9*  ?MG  --   --   --  2.3 2.2  ?PROCALCITON  --   --  <0.10 <0.10 <0.10  ?LATICACIDVEN 1.7  --   --   --   --   ?BNP 59.2  --   --  42.7 26.4  ? ? ?------------------------------------------------------------------------------------------------------------------ ?No results for input(s): CHOL, HDL, LDLCALC, TRIG, CHOLHDL, LDLDIRECT in the last 72 hours. ? ?Lab Results  ?Component Value Date  ? HGBA1C 5.5 03/30/2017  ? ? ?No results for input(s): TSH, T4TOTAL, T3FREE, THYROIDAB in the last 72 hours. ? ?Invalid input(s): FREET3 ?------------------------------------------------------------------------------------------------------------------ ?ID Labs ?Recent Labs  ?Lab 12/27/21 ?1633 12/28/21 ?0425 12/28/21 ?1612 12/29/21 ?0246 12/30/21 ?6378  ?WBC 16.5* 14.1*  --  18.6* 17.8*  ?PLT 272 313  --  269 282  ?PROCALCITON  --   --  <0.10 <0.10 <0.10  ?LATICACIDVEN 1.7  --   --   --   --   ?CREATININE 1.03 0.98  --  0.97 1.20  ? ?Cardiac Enzymes ?No results for input(s): CKMB, TROPONINI, MYOGLOBIN in the last 168 hours. ? ?Invalid input(s): CK ? ? ?Radiology Reports ?DG Chest 2 View ? ?Result Date: 12/27/2021 ?CLINICAL DATA:  Cough, congestion, short of breath for 1 week EXAM: CHEST - 2 VIEW COMPARISON:  12/22/2021 FINDINGS: Frontal and lateral views of the chest demonstrate a stable cardiac silhouette. Stable hyperinflation and background scarring consistent with emphysema. No acute airspace disease, effusion, or pneumothorax. Stable postsurgical changes from thyroidectomy and lower cervical fusion. No acute bony abnormalities. IMPRESSION: 1. Stable emphysema.  No acute process. Electronically Signed   By: Randa Ngo M.D.   On: 12/27/2021  15:27  ? ?CT Angio Chest PE W and/or Wo Contrast ? ?Result Date: 12/27/2021 ?CLINICAL DATA:  Chest pain or shortness of breath, pleurisy or effusion suspected. EXAM: CT ANGIOGRAPHY CHEST WITH CONTRAST TECHNIQUE: Multidete

## 2021-12-30 NOTE — Progress Notes (Signed)
Physical Therapy Treatment ?Patient Details ?Name: Arthur [name]. ?MRN: [id number] ?DOB: [birth year] ?Today's Date: 12/30/2021 ? ? ?History of Present Illness Pt adm 3/27 with acute on chronic respiratory failure likely due to copd exacerbation. PMH chf, htn, prostate Ca, COPD, lt TKR, rt TKR ? ?  ?PT Comments  ? ? Patient eager to participate with PT. Slight drifting with his gait, but likely exacerbated by his insisting on pushing the oxygen carrier. Did have to increase from 2 to 3L to maintain sats >87% (with good pleth).    ?Recommendations for follow up therapy are one component of a multi-disciplinary discharge planning process, led by the attending physician.  Recommendations may be updated based on patient status, additional functional criteria and insurance authorization. ? ?Follow Up Recommendations ? No PT follow up ?  ?  ?Assistance Recommended at Discharge None  ?Patient can return home with the following   ?  ?Equipment Recommendations ? None recommended by PT  ?  ?Recommendations for Other Services   ? ? ?  ?Precautions / Restrictions Precautions ?Precautions: Other (comment) ?Precaution Comments: watch SpO2 ?Restrictions ?Weight Bearing Restrictions: No  ?  ? ?Mobility ? Bed Mobility ?Overal bed mobility: Modified Independent ?  ?  ?  ?  ?  ?  ?  ?  ? ?Transfers ?Overall transfer level: Modified independent ?Equipment used: None ?  ?  ?  ?  ?  ?  ?  ?  ?  ? ?Ambulation/Gait ?Ambulation/Gait assistance: Supervision ?Gait Distance (Feet): 180 Feet ?Assistive device: None ?Gait Pattern/deviations: Decreased stride length, Drifts right/left ?Gait velocity: decr ?Gait velocity interpretation: >2.62 ft/sec, indicative of community ambulatory ?  ?General Gait Details: Pt with slight drift with gait but no loss of balance. Pt insisted on pushing portable oxygen carrier and this could have effected his gait path. ? ? ?Stairs ?  ?  ?  ?  ?  ? ? ?Wheelchair Mobility ?  ? ?Modified Rankin (Stroke Patients  Only) ?  ? ? ?  ?Balance Overall balance assessment: Modified Independent ?  ?  ?  ?  ?  ?  ?  ?  ?  ?  ?  ?  ?  ?  ?  ?  ?  ?  ?  ? ?  ?Cognition Arousal/Alertness: Awake/alert ?Behavior During Therapy: [facility] for tasks assessed/performed ?Overall Cognitive Status: Within Functional Limits for tasks assessed ?  ?  ?  ?  ?  ?  ?  ?  ?  ?  ?  ?  ?  ?  ?  ?  ?  ?  ?  ? ?  ?Exercises   ? ?  ?General Comments General comments (skin integrity, edema, etc.): see separate ambulatory oxygen saturation note ?  ?  ? ?Pertinent Vitals/Pain Pain Assessment ?Pain Assessment: No/denies pain  ? ? ?Home Living   ?  ?  ?  ?  ?  ?  ?  ?  ?  ?   ?  ?Prior Function    ?  ?  ?   ? ?PT Goals (current goals can now be found in the care plan section) Acute Rehab PT Goals ?Patient Stated Goal: return home ?Time For Goal Achievement: 01/05/22 ?Potential to Achieve Goals: Good ?Progress towards PT goals: Progressing toward goals ? ?  ?Frequency ? ? ? Min 3X/week ? ? ? ?  ?PT Plan Current plan remains appropriate  ? ? ?Co-evaluation   ?  ?  ?  ?  ? ?  ?  AM-PAC PT "6 Clicks" Mobility   ?Outcome Measure ? Help needed turning from your back to your side while in a flat bed without using bedrails?: None ?Help needed moving from lying on your back to sitting on the side of a flat bed without using bedrails?: None ?Help needed moving to and from a bed to a chair (including a wheelchair)?: None ?Help needed standing up from a chair using your arms (e.g., wheelchair or bedside chair)?: None ?Help needed to walk in hospital room?: A Little ?Help needed climbing 3-5 steps with a railing? : A Little ?6 Click Score: 22 ? ?  ?End of Session Equipment Utilized During Treatment: Oxygen ?Activity Tolerance: Patient tolerated treatment well ?Patient left: in bed;with call bell/phone within reach;with nursing/sitter in room ?Nurse Communication: Mobility status;Other (comment) (O2 note done) ?PT Visit Diagnosis: Other abnormalities of gait and mobility (R26.89) ?   ? ? ?Time: 1440-1501 ?PT Time Calculation (min) (ACUTE ONLY): 21 min ? ?Charges:  $Gait Training: 8-22 mins          ?          ? ? ?Arby Barrette, PT ?Acute Rehabilitation Services  ?Pager [phone] ?Office [phone] ? ? ? ?Jeanie Cooks Contina Strain ?12/30/2021, 3:17 PM ? ?

## 2021-12-30 NOTE — Progress Notes (Signed)
SATURATION QUALIFICATIONS: (This note is used to comply with regulatory documentation for home oxygen) ? ?Patient Saturations on Room Air at Rest = 87% ? ?Patient Saturations on Room Air while Ambulating = NA-dropped at rest ? ?Patient Saturations on 3 Liters of oxygen while Ambulating = 88% ? ?Please briefly explain why patient needs home oxygen: ? ?To maintain oxygen saturation >87% during functional activities. ? ? ?Arby Barrette, PT ?Acute Rehabilitation Services  ?Pager [phone] ?Office [phone] ? ?

## 2021-12-31 ENCOUNTER — Inpatient Hospital Stay (HOSPITAL_COMMUNITY): Payer: PPO

## 2021-12-31 DIAGNOSIS — J441 Chronic obstructive pulmonary disease with (acute) exacerbation: Secondary | ICD-10-CM | POA: Diagnosis not present

## 2021-12-31 LAB — CBC WITH DIFFERENTIAL/PLATELET
Abs Immature Granulocytes: 0.15 10*3/uL — ABNORMAL HIGH (ref 0.00–0.07)
Basophils Absolute: 0 10*3/uL (ref 0.0–0.1)
Basophils Relative: 0 %
Eosinophils Absolute: 0 10*3/uL (ref 0.0–0.5)
Eosinophils Relative: 0 %
HCT: 40.9 % (ref 39.0–52.0)
Hemoglobin: 13.3 g/dL (ref 13.0–17.0)
Immature Granulocytes: 1 %
Lymphocytes Relative: 7 %
Lymphs Abs: 1.3 10*3/uL (ref 0.7–4.0)
MCH: 29 pg (ref 26.0–34.0)
MCHC: 32.5 g/dL (ref 30.0–36.0)
MCV: 89.1 fL (ref 80.0–100.0)
Monocytes Absolute: 0.5 10*3/uL (ref 0.1–1.0)
Monocytes Relative: 2 %
Neutro Abs: 17.9 10*3/uL — ABNORMAL HIGH (ref 1.7–7.7)
Neutrophils Relative %: 90 %
Platelets: 286 10*3/uL (ref 150–400)
RBC: 4.59 MIL/uL (ref 4.22–5.81)
RDW: 13.4 % (ref 11.5–15.5)
WBC: 19.8 10*3/uL — ABNORMAL HIGH (ref 4.0–10.5)
nRBC: 0 % (ref 0.0–0.2)

## 2021-12-31 LAB — COMPREHENSIVE METABOLIC PANEL
ALT: 23 U/L (ref 0–44)
AST: 14 U/L — ABNORMAL LOW (ref 15–41)
Albumin: 2.9 g/dL — ABNORMAL LOW (ref 3.5–5.0)
Alkaline Phosphatase: 73 U/L (ref 38–126)
Anion gap: 10 (ref 5–15)
BUN: 31 mg/dL — ABNORMAL HIGH (ref 8–23)
CO2: 30 mmol/L (ref 22–32)
Calcium: 8.9 mg/dL (ref 8.9–10.3)
Chloride: 96 mmol/L — ABNORMAL LOW (ref 98–111)
Creatinine, Ser: 1.03 mg/dL (ref 0.61–1.24)
GFR, Estimated: >60 mL/min (ref >60)
Glucose, Bld: 140 mg/dL — ABNORMAL HIGH (ref 70–99)
Potassium: 4.8 mmol/L (ref 3.5–5.1)
Sodium: 136 mmol/L (ref 135–145)
Total Bilirubin: 0.4 mg/dL (ref 0.3–1.2)
Total Protein: 6.3 g/dL — ABNORMAL LOW (ref 6.5–8.1)

## 2021-12-31 LAB — MAGNESIUM: Magnesium: 2.4 mg/dL (ref 1.7–2.4)

## 2021-12-31 LAB — BRAIN NATRIURETIC PEPTIDE: B Natriuretic Peptide: 32.6 pg/mL (ref 0.0–100.0)

## 2021-12-31 MED ORDER — FUROSEMIDE 10 MG/ML IJ SOLN
40.0000 mg | Freq: Once | INTRAMUSCULAR | Status: AC
Start: 2021-12-31 — End: 2021-12-31
  Administered 2021-12-31: 40 mg via INTRAVENOUS
  Filled 2021-12-31: qty 4

## 2021-12-31 MED ORDER — ACETYLCYSTEINE 20 % IN SOLN
4.0000 mL | Freq: Once | RESPIRATORY_TRACT | Status: AC
  Administered 2021-12-31: 4 mL via RESPIRATORY_TRACT
  Filled 2021-12-31 (×2): qty 4

## 2021-12-31 NOTE — Progress Notes (Signed)
SATURATION QUALIFICATIONS: (This note is used to comply with regulatory documentation for home oxygen) ? ?Patient Saturations on Room Air at Rest = 87% ? ?Patient Saturations on Room Air while Ambulating = Not tested due to decr SpO2 at rest ? ?Patient Saturations on 3 Liters of oxygen while Ambulating = 91% ? ?Please briefly explain why patient needs home oxygen: Unable to maintain adequate level oxygenation with activity without supplemental O2.  ? ?[facility] PT ?Acute Rehabilitation Services ?Pager [phone] ?Office [phone] ? ?

## 2021-12-31 NOTE — Care Management Important Message (Signed)
Important Message ? ?Patient Details  ?Name: Arthur [name]. ?MRN: [id number] ?Date of Birth: Sep 20, 1941 ? ? ?Medicare Important Message Given:  Yes ? ? ? ? ?[name] ?12/31/2021, 1:04 PM ?

## 2021-12-31 NOTE — Progress Notes (Signed)
Physical Therapy Treatment ?Patient Details ?Name: Arthur [name]. ?MRN: [id number] ?DOB: 01-22-1941 ?Today's Date: 12/31/2021 ? ? ?History of Present Illness Pt adm 3/27 with acute on chronic respiratory failure likely due to copd exacerbation. PMH chf, htn, prostate Ca, COPD, lt TKR, rt TKR ? ?  ?PT Comments  ? ? Pt continues to be limited with activity by pulmonary status. Pt continues to require supplemental O2 and with incr wheezing with activity.    ?Recommendations for follow up therapy are one component of a multi-disciplinary discharge planning process, led by the attending physician.  Recommendations may be updated based on patient status, additional functional criteria and insurance authorization. ? ?Follow Up Recommendations ? No PT follow up ?  ?  ?Assistance Recommended at Discharge None  ?Patient can return home with the following   ?  ?Equipment Recommendations ? None recommended by PT  ?  ?Recommendations for Other Services   ? ? ?  ?Precautions / Restrictions Precautions ?Precautions: Other (comment) ?Precaution Comments: watch SpO2 ?Restrictions ?Weight Bearing Restrictions: No  ?  ? ?Mobility ? Bed Mobility ?Overal bed mobility: Modified Independent ?  ?  ?  ?  ?  ?  ?  ?  ? ?Transfers ?Overall transfer level: Modified independent ?Equipment used: None ?  ?  ?  ?  ?  ?  ?  ?  ?  ? ?Ambulation/Gait ?Ambulation/Gait assistance: Supervision ?Gait Distance (Feet): 180 Feet ?Assistive device: None ?Gait Pattern/deviations: Decreased stride length, Drifts right/left ?Gait velocity: decr ?Gait velocity interpretation: >2.62 ft/sec, indicative of community ambulatory ?  ?General Gait Details: Slight drifting with pt pushing O2. No loss of balance. ? ? ?Stairs ?  ?  ?  ?  ?  ? ? ?Wheelchair Mobility ?  ? ?Modified Rankin (Stroke Patients Only) ?  ? ? ?  ?Balance Overall balance assessment: Modified Independent ?  ?  ?  ?  ?  ?  ?  ?  ?  ?  ?  ?  ?  ?  ?  ?  ?  ?  ?  ? ?  ?Cognition Arousal/Alertness:  Awake/alert ?Behavior During Therapy: [facility] for tasks assessed/performed ?Overall Cognitive Status: Within Functional Limits for tasks assessed ?  ?  ?  ?  ?  ?  ?  ?  ?  ?  ?  ?  ?  ?  ?  ?  ?  ?  ?  ? ?  ?Exercises   ? ?  ?General Comments General comments (skin integrity, edema, etc.): Pt amb on 3L O2 with SpO2 91%. SpO2  92% at rest on 3L and 87% at rest on RA. ?  ?  ? ?Pertinent Vitals/Pain    ? ? ?Home Living   ?  ?  ?  ?  ?  ?  ?  ?  ?  ?   ?  ?Prior Function    ?  ?  ?   ? ?PT Goals (current goals can now be found in the care plan section) Acute Rehab PT Goals ?Patient Stated Goal: return home ?Progress towards PT goals: Progressing toward goals ? ?  ?Frequency ? ? ? Min 3X/week ? ? ? ?  ?PT Plan Current plan remains appropriate  ? ? ?Co-evaluation   ?  ?  ?  ?  ? ?  ?AM-PAC PT "6 Clicks" Mobility   ?Outcome Measure ? Help needed turning from your back to your side while in a flat bed  without using bedrails?: None ?Help needed moving from lying on your back to sitting on the side of a flat bed without using bedrails?: None ?Help needed moving to and from a bed to a chair (including a wheelchair)?: None ?Help needed standing up from a chair using your arms (e.g., wheelchair or bedside chair)?: None ?Help needed to walk in hospital room?: A Little ?Help needed climbing 3-5 steps with a railing? : A Little ?6 Click Score: 22 ? ?  ?End of Session Equipment Utilized During Treatment: Oxygen ?Activity Tolerance: Patient tolerated treatment well ?Patient left: with call bell/phone within reach;in chair;with chair alarm set ?Nurse Communication: Mobility status ?PT Visit Diagnosis: Other abnormalities of gait and mobility (R26.89) ?  ? ? ?Time: 0941-1000 ?PT Time Calculation (min) (ACUTE ONLY): 19 min ? ?Charges:  $Gait Training: 8-22 mins          ?          ? ?[facility] PT ?Acute Rehabilitation Services ?Pager [phone] ?Office [phone] ? ? ? ?Shary Decamp [facility] ?12/31/2021, 10:13 AM ? ?

## 2021-12-31 NOTE — Progress Notes (Signed)
?                                  PROGRESS NOTE                                             ?                                                                                                                     ?                                         ? ? Patient Demographics:  ? ? Arthur [name], is a 81 y.o. male, DOB - 1941/03/10, PJK:[id number] ? ?Outpatient Primary MD for the patient is [name], MD    LOS - 3  Admit date - 12/27/2021   ? ?Chief Complaint  ?Patient presents with  ? Shortness of Breath  ? Cough  ?    ? ?Brief Narrative (HPI from H&P)    81 year old M with PMH of COPD, systolic CHF/NICM with recovered EF, prostate cancer s/p prostatectomy, thyroid cancer s/p thyroidectomy, hypothyroidism, HTN, HLD and GERD presenting with ongoing productive cough and shortness of breath for over a week that did not improve despite outpatient treatment with Z-Pak and prednisone by his pulmonologist, and admitted for COPD exacerbation. ? ? Subjective:  ? ?Patient in bed, appears comfortable, denies any headache, no fever, no chest pain or pressure, having a lot of cough and some shortness of breath and wheezing, no abdominal pain. No new focal weakness. ? ? Assessment  & Plan :  ? ?Acute Hypoxic Resp. Failure due to Acute on chronic COPD exacerbation - he is already failed outpatient azithromycin and prednisone treatment, he is now on IV Solu-Medrol will switch azithromycin to Levaquin for 4 doses as he has already failed azithromycin.  CTA chest reassuring, continue supportive care with nebulizer treatments and inhalers. Encouraged the patient to sit up in chair in the daytime use I-S and flutter valve for pulmonary toiletry.  Will give one-time treatment of Mucomyst with aggressive oral suctioning on 12/31/2021,  advance activity and titrate down oxygen as possible. ? ?2.  Chronic diastolic CHF.  EF now 50% this year.  Continue combination of Coreg and Entresto.  As needed  diuretics as needed. ? ?3.  GERD.  PPI. ? ?4.  Dyslipidemia.  On statin. ? ?5.  Hypothyroidism.  On Synthroid continue. ? ?6.  Reactionary leukocytosis due to combination of COPD exacerbation and steroid use.  Monitor afebrile, stable procalcitonin. ? ?SpO2: 93 % ?O2 Flow Rate (L/min): 3 L/min ?  ? ?   ? ?Condition - Fair ? ?Family Communication  :  None present ? ?Code Status :  Full ? ?Consults  :  None ? ?PUD Prophylaxis : PPI ? ? Procedures  :    ? ?CTA - 1. No evidence of pulmonary embolism. 2. Bronchial wall thickening bilaterally with mild atelectasis, infiltrate or scarring in the medial aspects the upper lobes bilaterally, which may be infectious or inflammatory. 3. Stable bilateral pulmonary nodules measuring up to 6 mm, unchanged from 2021. 4. Aortic atherosclerosis. 5. Coronary artery calcifications.  ? ?   ? ?Disposition Plan  :   ? ?Status is: Inpatient ? ?DVT Prophylaxis  :   ? ?enoxaparin (LOVENOX) injection 40 mg Start: 12/27/21 2000 ? ?Lab Results  ?Component Value Date  ? PLT 286 12/31/2021  ? ? ?Diet :  ?Diet Order   ? ?       ?  Diet Heart Room service appropriate? Yes; Fluid consistency: Thin  Diet effective now       ?  ? ?  ?  ? ?  ?  ? ?Inpatient Medications ? ?Scheduled Meds: ? acetylcysteine  4 mL Nebulization Once  ? AeroChamber Plus Flo-Vu Large  1 each Other Once  ? atorvastatin  40 mg Oral Daily  ? carvedilol  6.25 mg Oral BID WC  ? enoxaparin (LOVENOX) injection  40 mg Subcutaneous Q24H  ? fluticasone furoate-vilanterol  1 puff Inhalation Daily  ? And  ? umeclidinium bromide  1 puff Inhalation Daily  ? furosemide  40 mg Intravenous Once  ? ipratropium-albuterol  3 mL Nebulization BID  ? levofloxacin  500 mg Oral Daily  ? levothyroxine  200 mcg Oral QAC breakfast  ? methylPREDNISolone (SOLU-MEDROL) injection  60 mg Intravenous Q12H  ? pantoprazole  40 mg Oral Daily  ? sacubitril-valsartan  1 tablet Oral BID  ? sodium chloride flush  3 mL Intravenous Q12H  ? ?Continuous Infusions: ?PRN  Meds:.acetaminophen **OR** acetaminophen, albuterol, guaiFENesin-dextromethorphan, polyethylene glycol ? ?Antibiotics  :   ? ?Anti-infectives (From admission, onward)  ? ? Start     Dose/Rate Route Frequency Ordered Stop  ? 12/29/21 1115  levofloxacin (LEVAQUIN) tablet 500 mg       ? 500 mg Oral Daily 12/29/21 1017 01/02/22 0959  ? 12/28/21 1000  azithromycin (ZITHROMAX) tablet 250 mg  Status:  Discontinued       ? 250 mg Oral Daily 12/27/21 1905 12/29/21 1017  ? ?  ? ? ? Time Spent in minutes  30 ? ? ?Lala Lund M.D on 12/31/2021 at 10:18 AM ? ?To page go to [email or web address]  ? ?[facility] -  Office  [phone] ? ?See all Orders from today for further details ? ? ? Objective:  ? ?Vitals:  ? 12/31/21 0735 12/31/21 0752 12/31/21 0753 12/31/21 0800  ?BP:  (!) 155/73  (!) 148/90  ?Pulse: 61 60  60  ?Resp: 16 (!) '22 18 20  '$ ?Temp:  97.6 ?F (36.4 ?C)    ?TempSrc:  Oral    ?SpO2: 94% 91% 91% 93%  ?Weight:      ?Height:      ? ? ?Wt Readings from Last 3 Encounters:  ?12/30/21 109.4 kg  ?12/22/21 111.6 kg  ?12/14/21 113 kg  ? ? ? ?Intake/Output Summary (Last 24 hours) at 12/31/2021 1018 ?Last data filed at 12/31/2021 0981 ?Gross per 24 hour  ?Intake 480 ml  ?Output 2600 ml  ?Net -2120 ml  ? ? ? ?Physical Exam ? ?Awake Alert, No new F.N deficits, Normal affect ?[place].AT,PERRAL ?Supple Neck, No  JVD,   ?Symmetrical Chest wall movement, moderate air movement bilaterally with wheezing and some coarse breath sounds ?RRR,No Gallops, Rubs or new Murmurs,  ?+ve B.Sounds, Abd Soft, No tenderness,   ?No Cyanosis, Clubbing or edema  ? ? ? Data Review:  ? ? ?CBC ?Recent Labs  ?Lab 12/27/21 ?1633 12/28/21 ?0425 12/29/21 ?0246 12/30/21 ?7341 12/31/21 ?0136  ?WBC 16.5* 14.1* 18.6* 17.8* 19.8*  ?HGB 13.3 13.8 12.3* 13.1 13.3  ?HCT 42.3 44.3 38.4* 40.3 40.9  ?PLT 272 313 269 282 286  ?MCV 91.0 89.7 88.3 88.4 89.1  ?MCH 28.6 27.9 28.3 28.7 29.0  ?MCHC 31.4 31.2 32.0 32.5 32.5  ?RDW 13.6 13.5 13.5 13.4 13.4  ?LYMPHSABS 1.2  --   --  1.2  1.3  ?MONOABS 0.6  --   --  0.3 0.5  ?EOSABS 0.0  --   --  0.0 0.0  ?BASOSABS 0.0  --   --  0.0 0.0  ? ? ?Electrolytes ?Recent Labs  ?Lab 12/27/21 ?1633 12/28/21 ?0425 12/28/21 ?1612 12/29/21 ?0246 12/30/21 ?9379 12/31/21 ?0136  ?NA 139 138  --  137 136 136  ?K 4.2 4.1  --  4.4 4.9 4.8  ?CL 104 98  --  100 98 96*  ?CO2 27 29  --  '30 28 30  '$ ?GLUCOSE 137* 149*  --  138* 184* 140*  ?BUN 19 21  --  27* 35* 31*  ?CREATININE 1.03 0.98  --  0.97 1.20 1.03  ?CALCIUM 8.9 9.2  --  8.8* 8.6* 8.9  ?AST 17 21  --   --  14* 14*  ?ALT 24 26  --   --  24 23  ?ALKPHOS 84 92  --   --  78 73  ?BILITOT 0.4 0.6  --   --  0.7 0.4  ?ALBUMIN 3.4* 3.4*  --  2.9* 2.9* 2.9*  ?MG  --   --   --  2.3 2.2 2.4  ?PROCALCITON  --   --  <0.10 <0.10 <0.10  --   ?LATICACIDVEN 1.7  --   --   --   --   --   ?BNP 59.2  --   --  42.7 26.4 32.6  ? ? ?------------------------------------------------------------------------------------------------------------------ ?No results for input(s): CHOL, HDL, LDLCALC, TRIG, CHOLHDL, LDLDIRECT in the last 72 hours. ? ?Lab Results  ?Component Value Date  ? HGBA1C 5.5 03/30/2017  ? ? ?No results for input(s): TSH, T4TOTAL, T3FREE, THYROIDAB in the last 72 hours. ? ?Invalid input(s): FREET3 ?------------------------------------------------------------------------------------------------------------------ ?ID Labs ?Recent Labs  ?Lab 12/27/21 ?1633 12/28/21 ?0425 12/28/21 ?1612 12/29/21 ?0246 12/30/21 ?0240 12/31/21 ?0136  ?WBC 16.5* 14.1*  --  18.6* 17.8* 19.8*  ?PLT 272 313  --  269 282 286  ?PROCALCITON  --   --  <0.10 <0.10 <0.10  --   ?LATICACIDVEN 1.7  --   --   --   --   --   ?CREATININE 1.03 0.98  --  0.97 1.20 1.03  ? ?Cardiac Enzymes ?No results for input(s): CKMB, TROPONINI, MYOGLOBIN in the last 168 hours. ? ?Invalid input(s): CK ? ? ?Radiology Reports ?DG Chest 2 View ? ?Result Date: 12/27/2021 ?CLINICAL DATA:  Cough, congestion, short of breath for 1 week EXAM: CHEST - 2 VIEW COMPARISON:  12/22/2021 FINDINGS:  Frontal and lateral views of the chest demonstrate a stable cardiac silhouette. Stable hyperinflation and background scarring consistent with emphysema. No acute airspace disease, effusion, or pneumothorax.

## 2022-01-01 ENCOUNTER — Inpatient Hospital Stay (HOSPITAL_COMMUNITY): Payer: PPO

## 2022-01-01 DIAGNOSIS — I5032 Chronic diastolic (congestive) heart failure: Secondary | ICD-10-CM

## 2022-01-01 LAB — COMPREHENSIVE METABOLIC PANEL
ALT: 21 U/L (ref 0–44)
AST: 12 U/L — ABNORMAL LOW (ref 15–41)
Albumin: 2.9 g/dL — ABNORMAL LOW (ref 3.5–5.0)
Alkaline Phosphatase: 66 U/L (ref 38–126)
Anion gap: 9 (ref 5–15)
BUN: 40 mg/dL — ABNORMAL HIGH (ref 8–23)
CO2: 30 mmol/L (ref 22–32)
Calcium: 9 mg/dL (ref 8.9–10.3)
Chloride: 96 mmol/L — ABNORMAL LOW (ref 98–111)
Creatinine, Ser: 1.31 mg/dL — ABNORMAL HIGH (ref 0.61–1.24)
GFR, Estimated: 55 mL/min — ABNORMAL LOW (ref >60)
Glucose, Bld: 191 mg/dL — ABNORMAL HIGH (ref 70–99)
Potassium: 4.5 mmol/L (ref 3.5–5.1)
Sodium: 135 mmol/L (ref 135–145)
Total Bilirubin: 0.7 mg/dL (ref 0.3–1.2)
Total Protein: 6.1 g/dL — ABNORMAL LOW (ref 6.5–8.1)

## 2022-01-01 LAB — MAGNESIUM: Magnesium: 2.1 mg/dL (ref 1.7–2.4)

## 2022-01-01 LAB — CULTURE, BLOOD (ROUTINE X 2)
Culture: NO GROWTH
Special Requests: ADEQUATE

## 2022-01-01 LAB — CBC WITH DIFFERENTIAL/PLATELET
Abs Immature Granulocytes: 0.27 10*3/uL — ABNORMAL HIGH (ref 0.00–0.07)
Basophils Absolute: 0 10*3/uL (ref 0.0–0.1)
Basophils Relative: 0 %
Eosinophils Absolute: 0 10*3/uL (ref 0.0–0.5)
Eosinophils Relative: 0 %
HCT: 41.4 % (ref 39.0–52.0)
Hemoglobin: 13.3 g/dL (ref 13.0–17.0)
Immature Granulocytes: 1 %
Lymphocytes Relative: 6 %
Lymphs Abs: 1.3 10*3/uL (ref 0.7–4.0)
MCH: 28.6 pg (ref 26.0–34.0)
MCHC: 32.1 g/dL (ref 30.0–36.0)
MCV: 89 fL (ref 80.0–100.0)
Monocytes Absolute: 0.7 10*3/uL (ref 0.1–1.0)
Monocytes Relative: 3 %
Neutro Abs: 19.5 10*3/uL — ABNORMAL HIGH (ref 1.7–7.7)
Neutrophils Relative %: 90 %
Platelets: 292 10*3/uL (ref 150–400)
RBC: 4.65 MIL/uL (ref 4.22–5.81)
RDW: 13.3 % (ref 11.5–15.5)
WBC: 21.8 10*3/uL — ABNORMAL HIGH (ref 4.0–10.5)
nRBC: 0 % (ref 0.0–0.2)

## 2022-01-01 LAB — ECHOCARDIOGRAM COMPLETE
AR max vel: 2.6 cm2
AV Area VTI: 2.9 cm2
AV Area mean vel: 2.47 cm2
AV Mean grad: 6 mmHg
AV Peak grad: 12.8 mmHg
Ao pk vel: 1.79 m/s
Area-P 1/2: 2.53 cm2
Calc EF: 51 %
Height: 72 in
MV VTI: 2.55 cm2
S' Lateral: 3.8 cm
Single Plane A2C EF: 60 %
Single Plane A4C EF: 50.2 %
Weight: 3858.93 oz

## 2022-01-01 LAB — BRAIN NATRIURETIC PEPTIDE: B Natriuretic Peptide: 16.9 pg/mL (ref 0.0–100.0)

## 2022-01-01 LAB — TSH: TSH: 1.446 u[IU]/mL (ref 0.350–4.500)

## 2022-01-01 MED ORDER — AMLODIPINE BESYLATE 10 MG PO TABS
10.0000 mg | ORAL_TABLET | Freq: Once | ORAL | Status: AC
  Administered 2022-01-01: 10 mg via ORAL
  Filled 2022-01-01: qty 1

## 2022-01-01 MED ORDER — SACUBITRIL-VALSARTAN 49-51 MG PO TABS
1.0000 | ORAL_TABLET | Freq: Two times a day (BID) | ORAL | Status: DC
  Administered 2022-01-02: 1 via ORAL
  Filled 2022-01-01 (×2): qty 1

## 2022-01-01 MED ORDER — LACTATED RINGERS IV SOLN
INTRAVENOUS | Status: AC
Start: 2022-01-01 — End: 2022-01-01

## 2022-01-01 MED ORDER — METHYLPREDNISOLONE SODIUM SUCC 40 MG IJ SOLR
40.0000 mg | Freq: Two times a day (BID) | INTRAMUSCULAR | Status: DC
  Administered 2022-01-01 – 2022-01-02 (×2): 40 mg via INTRAVENOUS
  Filled 2022-01-01 (×2): qty 1

## 2022-01-01 NOTE — Progress Notes (Signed)
Physical Therapy Treatment ?Patient Details ?Name: Arthur [name]. ?MRN: [id number] ?DOB: [birth year] ?Today's Date: 01/01/2022 ? ? ?History of Present Illness Pt adm 3/27 with acute on chronic respiratory failure likely due to copd exacerbation. PMH chf, htn, prostate Ca, COPD, lt TKR, rt TKR ? ?  ?PT Comments  ? ? Pt supine in bed finishing lunch on entry, agreeable to walk with therapy. Pt with [place] on but no oxygen running SpO2 96%O2. With ambulation on RA SpO2 88-92% O2 rebounds quickly with purse lip breathing. D/c plans remain appropriate. PT will continue to follow acutely. ?  ?Recommendations for follow up therapy are one component of a multi-disciplinary discharge planning process, led by the attending physician.  Recommendations may be updated based on patient status, additional functional criteria and insurance authorization. ? ?Follow Up Recommendations ? No PT follow up ?  ?  ?Assistance Recommended at Discharge None  ?   ?Equipment Recommendations ? None recommended by PT  ?  ?   ?Precautions / Restrictions Precautions ?Precautions: Other (comment) ?Precaution Comments: watch SpO2 ?Restrictions ?Weight Bearing Restrictions: No  ?  ? ?Mobility ? Bed Mobility ?Overal bed mobility: Modified Independent ?  ?  ?  ?  ?  ?  ?  ?  ? ?Transfers ?Overall transfer level: Modified independent ?Equipment used: None ?  ?  ?  ?  ?  ?  ?  ?  ?  ? ?Ambulation/Gait ?Ambulation/Gait assistance: Supervision ?Gait Distance (Feet): 200 Feet ?Assistive device: None ?Gait Pattern/deviations: Decreased stride length, Dr[place] right/left ?Gait velocity: decr ?Gait velocity interpretation: >2.62 ft/sec, indicative of community ambulatory ?  ?General Gait Details: mildly unsteady gait no LoB ? ? ? ? ?  ?Balance Overall balance assessment: Modified Independent ?  ?  ?  ?  ?  ?  ?  ?  ?  ?  ?  ?  ?  ?  ?  ?  ?  ?  ?  ? ?  ?Cognition Arousal/Alertness: Awake/alert ?Behavior During Therapy: [facility] for tasks assessed/performed ?Overall  Cognitive Status: Within Functional Limits for tasks assessed ?  ?  ?  ?  ?  ?  ?  ?  ?  ?  ?  ?  ?  ?  ?  ?  ?  ?  ?  ? ?  ?   ?General Comments General comments (skin integrity, edema, etc.): Ambulated on RA with SpO2 88-92%O2, rebounds quickly with cues for purse lipped breathing ?  ?  ? ?Pertinent Vitals/Pain Pain Assessment ?Pain Assessment: No/denies pain  ? ? ? ?PT Goals (current goals can now be found in the care plan section) Acute Rehab PT Goals ?Patient Stated Goal: return home ?PT Goal Formulation: With patient ?Time For Goal Achievement: 01/05/22 ?Potential to Achieve Goals: Good ?Progress towards PT goals: Progressing toward goals ? ?  ?Frequency ? ? ? Min 3X/week ? ? ? ?  ?PT Plan Current plan remains appropriate  ? ? ?   ?AM-PAC PT "6 Clicks" Mobility   ?Outcome Measure ? Help needed turning from your back to your side while in a flat bed without using bedrails?: None ?Help needed moving from lying on your back to sitting on the side of a flat bed without using bedrails?: None ?Help needed moving to and from a bed to a chair (including a wheelchair)?: None ?Help needed standing up from a chair using your arms (e.g., wheelchair or bedside chair)?: None ?Help needed to walk in hospital room?:  A Little ?Help needed climbing 3-5 steps with a railing? : A Little ?6 Click Score: 22 ? ?  ?End of Session Equipment Utilized During Treatment: Gait belt ?Activity Tolerance: Patient tolerated treatment well ?Patient left: with call bell/phone within reach;in chair;with chair alarm set ?Nurse Communication: Mobility status ?PT Visit Diagnosis: Other abnormalities of gait and mobility (R26.89) ?  ? ? ?Time: 8841-6606 ?PT Time Calculation (min) (ACUTE ONLY): 27 min ? ?Charges:  $Therapeutic Exercise: 8-22 mins ?$Therapeutic Activity: 8-22 mins          ?          ? ?Kellon Chalk B. Migdalia Dk PT, DPT ?Acute Rehabilitation Services ?Pager [phone] ?Office [phone] ? ? ? ?[place] ?01/01/2022, 3:24  PM ? ?

## 2022-01-01 NOTE — Plan of Care (Signed)
?  Problem: Education: ?Goal: Knowledge of disease or condition will improve ?Outcome: Progressing ?Goal: Knowledge of the prescribed therapeutic regimen will improve ?Outcome: Progressing ?Goal: Individualized Educational Video(s) ?Outcome: Progressing ?  ?Problem: Activity: ?Goal: Ability to tolerate increased activity will improve ?Outcome: Progressing ?Goal: Will verbalize the importance of balancing activity with adequate rest periods ?Outcome: Progressing ?  ?Problem: Respiratory: ?Goal: Ability to maintain a clear airway will improve ?Outcome: Progressing ?Goal: Levels of oxygenation will improve ?Outcome: Progressing ?Goal: Ability to maintain adequate ventilation will improve ?Outcome: Progressing ?  ?Problem: Education: ?Goal: Knowledge of General Education information will improve ?Description: Including pain rating scale, medication(s)/side effects and non-pharmacologic comfort measures ?Outcome: Progressing ?  ?Problem: Health Behavior/Discharge Planning: ?Goal: Ability to manage health-related needs will improve ?Outcome: Progressing ?  ?Problem: Clinical Measurements: ?Goal: Ability to maintain clinical measurements within normal limits will improve ?Outcome: Progressing ?Goal: Will remain free from infection ?Outcome: Progressing ?Goal: Diagnostic test results will improve ?Outcome: Progressing ?Goal: Respiratory complications will improve ?Outcome: Progressing ?Goal: Cardiovascular complication will be avoided ?Outcome: Progressing ?  ?Problem: Clinical Measurements: ?Goal: Respiratory complications will improve ?Outcome: Progressing ?  ?Problem: Clinical Measurements: ?Goal: Cardiovascular complication will be avoided ?Outcome: Progressing ?  ?Problem: Activity: ?Goal: Risk for activity intolerance will decrease ?Outcome: Progressing ?  ?Problem: Nutrition: ?Goal: Adequate nutrition will be maintained ?Outcome: Progressing ?  ?Problem: Coping: ?Goal: Level of anxiety will decrease ?Outcome:  Progressing ?  ?Problem: Elimination: ?Goal: Will not experience complications related to bowel motility ?Outcome: Progressing ?Goal: Will not experience complications related to urinary retention ?Outcome: Progressing ?  ?

## 2022-01-01 NOTE — Progress Notes (Signed)
Patient HR became tachycardic hr up to 140's respirations 26 O2 99-100% on 2 liters patient used inhaler  and stats did improved some gave Albuterol nebulizer treatment and patient was asked to stop talking and focus on breathing. Temperature was lowered in room and patient was given a fan as he complained of it being to hot. After treatment patient stabilized patient O2 was increase to 3liter nasal cannula. Arthor Captain LPN  ?

## 2022-01-01 NOTE — Progress Notes (Signed)
*  PRELIMINARY RESULTS* ?Echocardiogram ?2D Echocardiogram has been performed. ? ?Arthur [name] ?01/01/2022, 1:20 PM ?

## 2022-01-01 NOTE — Progress Notes (Signed)
?                                  PROGRESS NOTE                                             ?                                                                                                                     ?                                         ? ? Patient Demographics:  ? ? Arthur [name], is a 81 y.o. male, DOB - December 09, 1940, FTD:[id number] ? ?Outpatient Primary MD for the patient is [name], MD    LOS - 4  Admit date - 12/27/2021   ? ?Chief Complaint  ?Patient presents with  ? Shortness of Breath  ? Cough  ?    ? ?Brief Narrative (HPI from H&P)    81 year old M with PMH of COPD, systolic CHF/NICM with recovered EF, prostate cancer s/p prostatectomy, thyroid cancer s/p thyroidectomy, hypothyroidism, HTN, HLD and GERD presenting with ongoing productive cough and shortness of breath for over a week that did not improve despite outpatient treatment with Z-Pak and prednisone by his pulmonologist, and admitted for COPD exacerbation. ? ? Subjective:  ? ?Patient in bed, appears comfortable, denies any headache, no fever, no chest pain or pressure, having a lot of cough and some shortness of breath and wheezing, no abdominal pain. No new focal weakness. ? ? Assessment  & Plan :  ? ?Acute Hypoxic Resp. Failure due to Acute on chronic COPD exacerbation - he is already failed outpatient azithromycin and prednisone treatment, he is now on IV Solu-Medrol will switch azithromycin to Levaquin for 4 doses as he has already failed azithromycin.  CTA chest reassuring, continue supportive care with nebulizer treatments and inhalers. Encouraged the patient to sit up in chair in the daytime use I-S and flutter valve for pulmonary toiletry.  Will give one-time treatment of Mucomyst with aggressive oral suctioning on 12/31/2021,  advance activity and titrate down oxygen as possible. ? ?2.  Chronic diastolic CHF.  EF now 50% this year.  Continue combination of Coreg and Entresto.  As needed  diuretics.  Echocardiogram pending. ? ?3.  GERD.  PPI. ? ?4.  Dyslipidemia.  On statin. ? ?5.  Hypothyroidism.  On Synthroid continue. ? ?6.  Reactionary leukocytosis due to combination of COPD exacerbation and steroid use.  Monitor afebrile, stable procalcitonin. ? ?7. 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  Continue beta-blocker outpatient cardiothoracic surgery follow-up to be  arranged by PCP. ? ? ?SpO2: 100 % ?O2 Flow Rate (L/min): 3 L/min ?FiO2 (%): 32 % ?  ? ?   ? ?Condition - Fair ? ?Family Communication  :  None present ? ?Code Status :  Full ? ?Consults  :  None ? ?PUD Prophylaxis : PPI ? ? Procedures  :    ? ?TTE - ? ?CT - Bronchial wall thickening in keeping with diffuse airway inflammation. Peribronchial nodularity within the right lower lobe in keeping with acute infectious bronchiolitis or the sequela of subacute to remote aspiration or chronic atypical infection as can be seen with atypical mycobacterial or fungal infection. Moderate multi-vessel coronary artery calcification. 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 Recommend annual imaging followup by CTA or MRA. ? ?CTA - 1. No evidence of pulmonary embolism. 2. Bronchial wall thickening bilaterally with mild atelectasis, infiltrate or scarring in the medial aspects the upper lobes bilaterally, which may be infectious or inflammatory. 3. Stable bilateral pulmonary nodules measuring up to 6 mm, unchanged from 2021. 4. Aortic atherosclerosis. 5. Coronary artery calcifications.  ? ?   ? ?Disposition Plan  :   ? ?Status is: Inpatient ? ?DVT Prophylaxis  :   ? ?enoxaparin (LOVENOX) injection 40 mg Start: 12/27/21 2000 ? ?Lab Results  ?Component Value Date  ? PLT 292 01/01/2022  ? ? ?Diet :  ?Diet Order   ? ?       ?  Diet Heart Room service appropriate? Yes; Fluid consistency: Thin  Diet effective now       ?  ? ?  ?  ? ?  ?  ? ?Inpatient  Medications ? ?Scheduled Meds: ? AeroChamber Plus Flo-Vu Large  1 each Other Once  ? atorvastatin  40 mg Oral Daily  ? carvedilol  6.25 mg Oral BID WC  ? enoxaparin (LOVENOX) injection  40 mg Subcutaneous Q24H  ? fluticasone furoate-vilanterol  1 puff Inhalation Daily  ? And  ? umeclidinium bromide  1 puff Inhalation Daily  ? levothyroxine  200 mcg Oral QAC breakfast  ? methylPREDNISolone (SOLU-MEDROL) injection  40 mg Intravenous Q12H  ? pantoprazole  40 mg Oral Daily  ? [START ON 01/02/2022] sacubitril-valsartan  1 tablet Oral BID  ? sodium chloride flush  3 mL Intravenous Q12H  ? ?Continuous Infusions: ? lactated ringers 100 mL/hr at 01/01/22 4235  ? ?PRN Meds:.acetaminophen **OR** acetaminophen, albuterol, guaiFENesin-dextromethorphan, polyethylene glycol ? ?Antibiotics  :   ? ?Anti-infectives (From admission, onward)  ? ? Start     Dose/Rate Route Frequency Ordered Stop  ? 12/29/21 1115  levofloxacin (LEVAQUIN) tablet 500 mg       ? 500 mg Oral Daily 12/29/21 1017 01/01/22 0915  ? 12/28/21 1000  azithromycin (ZITHROMAX) tablet 250 mg  Status:  Discontinued       ? 250 mg Oral Daily 12/27/21 1905 12/29/21 1017  ? ?  ? ? ? Time Spent in minutes  30 ? ? ?Lala Lund M.D on 01/01/2022 at 10:22 AM ? ?To page go to [email or web address]  ? ?[facility] -  Office  [phone] ? ?See all Orders from today for further details ? ? ? Objective:  ? ?Vitals:  ? 01/01/22 0867 01/01/22 6195 01/01/22 0932 01/01/22 0825  ?BP:  (!) 141/85  93/60  ?Pulse: (!) 116  62   ?Resp: '18 17 12 18  '$ ?Temp:  97.6 ?F (36.4 ?C)  98.2 ?F (36.8 ?C)  ?TempSrc:  Oral  Oral  ?SpO2: 100%  100%   ?Weight:      ?Height:      ? ? ?Wt Readings from Last 3 Encounters:  ?12/30/21 109.4 kg  ?12/22/21 111.6 kg  ?12/14/21 113 kg  ? ? ? ?Intake/Output Summary (Last 24 hours) at 01/01/2022 1022 ?Last data filed at 01/01/2022 0100 ?Gross per 24 hour  ?Intake 480 ml  ?Output 1950 ml  ?Net -1470 ml  ? ? ? ?Physical Exam ? ?Awake Alert, No new F.N deficits, Normal  affect ?[place].AT,PERRAL ?Supple Neck, No JVD,   ?Symmetrical Chest wall movement, moderate air movement bilaterally with wheezing and some coarse breath sounds ?RRR,No Gallops, Rubs or new Murmurs,  ?+ve B.Sounds, Abd Soft, No tenderness,   ?No Cyanosis, Clubbing or edema  ? ? ? ? Data Review:  ? ? ?CBC ?Recent Labs  ?Lab 12/27/21 ?1633 12/28/21 ?0425 12/29/21 ?0246 12/30/21 ?0923 12/31/21 ?0136 01/01/22 ?0124  ?WBC 16.5* 14.1* 18.6* 17.8* 19.8* 21.8*  ?HGB 13.3 13.8 12.3* 13.1 13.3 13.3  ?HCT 42.3 44.3 38.4* 40.3 40.9 41.4  ?PLT 272 313 269 282 286 292  ?MCV 91.0 89.7 88.3 88.4 89.1 89.0  ?MCH 28.6 27.9 28.3 28.7 29.0 28.6  ?MCHC 31.4 31.2 32.0 32.5 32.5 32.1  ?RDW 13.6 13.5 13.5 13.4 13.4 13.3  ?LYMPHSABS 1.2  --   --  1.2 1.3 1.3  ?MONOABS 0.6  --   --  0.3 0.5 0.7  ?EOSABS 0.0  --   --  0.0 0.0 0.0  ?BASOSABS 0.0  --   --  0.0 0.0 0.0  ? ? ?Electrolytes ?Recent Labs  ?Lab 12/27/21 ?1633 12/28/21 ?0425 12/28/21 ?1612 12/29/21 ?0246 12/30/21 ?3007 12/31/21 ?0136 01/01/22 ?0124  ?NA 139 138  --  137 136 136 135  ?K 4.2 4.1  --  4.4 4.9 4.8 4.5  ?CL 104 98  --  100 98 96* 96*  ?CO2 27 29  --  '30 28 30 30  '$ ?GLUCOSE 137* 149*  --  138* 184* 140* 191*  ?BUN 19 21  --  27* 35* 31* 40*  ?CREATININE 1.03 0.98  --  0.97 1.20 1.03 1.31*  ?CALCIUM 8.9 9.2  --  8.8* 8.6* 8.9 9.0  ?AST 17 21  --   --  14* 14* 12*  ?ALT 24 26  --   --  '24 23 21  '$ ?ALKPHOS 84 92  --   --  78 73 66  ?BILITOT 0.4 0.6  --   --  0.7 0.4 0.7  ?ALBUMIN 3.4* 3.4*  --  2.9* 2.9* 2.9* 2.9*  ?MG  --   --   --  2.3 2.2 2.4 2.1  ?PROCALCITON  --   --  <0.10 <0.10 <0.10  --   --   ?LATICACIDVEN 1.7  --   --   --   --   --   --   ?BNP 59.2  --   --  42.7 26.4 32.6 16.9  ? ? ?------------------------------------------------------------------------------------------------------------------ ?No results for input(s): CHOL, HDL, LDLCALC, TRIG, CHOLHDL, LDLDIRECT in the last 72 hours. ? ?Lab Results  ?Component Value Date  ? HGBA1C 5.5 03/30/2017  ? ? ?No results for  input(s): TSH, T4TOTAL, T3FREE, THYROIDAB in the last 72 hours. ? ?Invalid input(s): FREET3 ?------------------------------------------------------------------------------------------------------------------ ?ID Labs

## 2022-01-01 NOTE — Evaluation (Signed)
Clinical/Bedside Swallow Evaluation ?Patient Details  ?Name: Arthur [name]. ?MRN: [id number] ?Date of Birth: December 20, 1940 ? ?Today's Date: 01/01/2022 ?Time: SLP Start Time (ACUTE ONLY): 8144 SLP Stop Time (ACUTE ONLY): 0930 ?SLP Time Calculation (min) (ACUTE ONLY): 16 min ? ?Past Medical History:  ?Past Medical History:  ?Diagnosis Date  ? Arthritis   ? Bifascicular block   ? a. 1st degree AVB/LBBB.  ? Cancer [facility])   ? thyroid and prostate  ? Cataract   ? Chronic systolic CHF (congestive heart failure) ([place] [address])   ? COPD (chronic obstructive pulmonary disease) ([place])   ? ED (erectile dysfunction)   ? Essential hypertension, benign   ? chris guess  pcp  ? GERD (gastroesophageal reflux disease)   ? Hypercholesterolemia   ? Kidney stone   ? renal stone  ? LBBB (left bundle branch block) 10/2007  ? Migraines   ? Mild dietary indigestion   ? NICM (nonischemic cardiomyopathy) ([place])   ? Nonischemic cardiomyopathy ([place])   ? Prostate cancer ([place])   ? Reflux esophagitis   ? Reflux esophagitis   ? Shortness of breath dyspnea   ? W/ EXERTION   ? Spider bite   ? Thyroid cancer ([place])   ? Tobacco use disorder   ? Unspecified hypothyroidism   ? ?Past Surgical History:  ?Past Surgical History:  ?Procedure Laterality Date  ? CARDIAC CATHETERIZATION    ? 2009   no stents  ? CARDIAC CATHETERIZATION N/A 09/02/2016  ? Procedure: Left Heart Cath and Coronary Angiography;  Surgeon: [name], MD;  Location: [place] [facility];  Service: Cardiovascular;  Laterality: N/A;  ? CHOLECYSTECTOMY N/A 03/30/2017  ? Procedure: LAPAROSCOPIC CHOLECYSTECTOMY;  Surgeon: [name], MD;  Location: [place];  Service: General;  Laterality: N/A;  ? CHOLECYSTECTOMY  2018  ? COLONOSCOPY  Multiple  ? Adenomatous polyps  ? CYSTOSCOPY/RETROGRADE/URETEROSCOPY/STONE EXTRACTION WITH BASKET Left 09/24/2014  ? Procedure: CYSTOSCOPY/RETROGRADE/URETEROSCOPY/STONE EXTRACTION WITH BASKET FROM URETER AND KIDNEY/STENT PLACEMENT;  Surgeon: [name], MD;  Location: [facility]  ORS;  Service: Urology;  Laterality: Left;  ? ESOPHAGOGASTRODUODENOSCOPY  Multiple  ? GERD  ? FOOT SURGERY    ? RIGHT   ? HERNIA REPAIR  1976  ? HOLMIUM LASER APPLICATION Left 81/85/6314  ? Procedure: HOLMIUM LASER APPLICATION;  Surgeon: [name], MD;  Location: [facility] ORS;  Service: Urology;  Laterality: Left;  ? NASAL SINUS SURGERY    ? NECK SURGERY    ? plates/screws from [place]  ? PROSTATECTOMY    ? SINUS ENDO W/FUSION Bilateral 10/28/2015  ? Procedure: ENDOSCOPIC SINUS SURGERY WITH NAVIGATION;  Surgeon: [name], MD;  Location: [facility];  Service: ENT;  Laterality: Bilateral;  ? THYROIDECTOMY    ? TOTAL KNEE ARTHROPLASTY    ? right  ? TOTAL KNEE ARTHROPLASTY  08/03/2012  ? Procedure: TOTAL KNEE ARTHROPLASTY;  Surgeon: [name], MD;  Location: [place];  Service: Orthopedics;  Laterality: Left;  ? UPPER GASTROINTESTINAL ENDOSCOPY    ? ?HPI:  ?Pt adm 3/27 with acute on chronic respiratory failure likely due to copd exacerbation. PMH chf, htn, prostate Ca, COPD, lt TKR, rt TKR. CT of chest with Bronchial wall thickening in keeping with diffuse airway  inflammation, Peribronchial nodularity within the right lower lobe in keeping with acute infectious bronchiolitis or the sequela of subacute to remote aspiration or chronic atypical infection as can be seen with atypical mycobacterial or fungal infection  ?  ?Assessment / Plan / Recommendation  ?Clinical Impression ?  Pt presents with a suspected grossly functional swallow. He reports hx of esophageal dilation, infrequent reports of globus sensation with "peas". He reports no general swallowing capability complaints. Pt assessed with regular and thin liquid breakfast meal. 3 oz water challege was unremarkable. Pt does have a baseline cough that was observed x1 (delayed) following solid PO, suspected to be related to pulmonary status with COPD baseline vs airway invasion during PO consumption. No overt s/sx of aspiration with any PO. No further ST needs identified. ? ?SLP  Visit Diagnosis: Dysphagia, unspecified (R13.10) ?   ?Aspiration Risk ? Mild aspiration risk  ?  ?Diet Recommendation   Regular thin liquids ? ?Medication Administration: Whole meds with liquid  ?  ?Other  Recommendations Oral Care Recommendations: Oral care BID   ? ?Recommendations for follow up therapy are one component of a multi-disciplinary discharge planning process, led by the attending physician.  Recommendations may be updated based on patient status, additional functional criteria and insurance authorization. ? ?Follow up Recommendations No SLP follow up  ? ? ?  ?Assistance Recommended at Discharge None  ?Functional Status Assessment    ?Frequency and Duration    ?  ?  ?   ? ?Prognosis    ? ?  ? ?Swallow Study   ?General Date of Onset: 12/27/21 ?HPI: Pt adm 3/27 with acute on chronic respiratory failure likely due to copd exacerbation. PMH chf, htn, prostate Ca, COPD, lt TKR, rt TKR. CT of chest with Bronchial wall thickening in keeping with diffuse airway  inflammation, Peribronchial nodularity within the right lower lobe in keeping with acute infectious bronchiolitis or the sequela of subacute to remote aspiration or chronic atypical infection as can be seen with atypical mycobacterial or fungal infection ?Type of Study: Bedside Swallow Evaluation ?Previous Swallow Assessment: none on file ?Diet Prior to this Study: Regular;Thin liquids ?Temperature Spikes Noted: No ?Respiratory Status: Nasal cannula ?History of Recent Intubation: No ?Behavior/Cognition: Alert;Cooperative;Pleasant mood ?Oral Cavity Assessment: Within Functional Limits ?Oral Care Completed by SLP: No ?Oral Cavity - Dentition: Dentures, top;Dentures, bottom ?Vision: Functional for self-feeding ?Self-Feeding Abilities: Able to feed self ?Patient Positioning: Upright in bed ?Baseline Vocal Quality: Normal ?Volitional Cough: Strong ?Volitional Swallow: Able to elicit  ?  ?Oral/Motor/Sensory Function Overall Oral Motor/Sensory Function: Within  functional limits   ?Ice Chips Ice chips: Not tested   ?Thin Liquid Thin Liquid: Within functional limits ?Presentation: Cup;Straw  ?  ?Nectar Thick Nectar Thick Liquid: Not tested   ?Honey Thick Honey Thick Liquid: Not tested   ?Puree Puree: Within functional limits   ?Solid ? ? ?  Solid: Within functional limits ?Presentation: Self Fed  ? ?  ? ?[place], CCC-SLP ?Acute Rehabilitation Services  ? ?01/01/2022,9:45 AM ? ? ? ?

## 2022-01-01 NOTE — Discharge Instructions (Signed)
Follow with Primary MD [name], MD in 7 days , review your CT scan reports with your PCP in detail in 1 week. ? ?Get CBC, CMP, 2 view Chest X ray -  checked next visit within 1 week by Primary MD  ? ?Activity: As tolerated with Full fall precautions use walker/cane & assistance as needed ? ?Disposition Home   ? ?Diet: Heart Healthy   ? ?Special Instructions: If you have smoked or chewed Tobacco  in the last 2 yrs please stop smoking, stop any regular Alcohol  and or any Recreational drug use. ? ?On your next visit with your primary care physician please Get Medicines reviewed and adjusted. ? ?Please request your Prim.MD to go over all Hospital Tests and Procedure/Radiological results at the follow up, please get all Hospital records sent to your Prim MD by signing hospital release before you go home. ? ?If you experience worsening of your admission symptoms, develop shortness of breath, life threatening emergency, suicidal or homicidal thoughts you must seek medical attention immediately by calling 911 or calling your MD immediately  if symptoms less severe. ? ?You Must read complete instructions/literature along with all the possible adverse reactions/side effects for all the Medicines you take and that have been prescribed to you. Take any new Medicines after you have completely understood and accpet all the possible adverse reactions/side effects.  ? ?  ?

## 2022-01-02 LAB — COMPREHENSIVE METABOLIC PANEL
ALT: 20 U/L (ref 0–44)
AST: 11 U/L — ABNORMAL LOW (ref 15–41)
Albumin: 2.7 g/dL — ABNORMAL LOW (ref 3.5–5.0)
Alkaline Phosphatase: 58 U/L (ref 38–126)
Anion gap: 6 (ref 5–15)
BUN: 43 mg/dL — ABNORMAL HIGH (ref 8–23)
CO2: 31 mmol/L (ref 22–32)
Calcium: 8.8 mg/dL — ABNORMAL LOW (ref 8.9–10.3)
Chloride: 99 mmol/L (ref 98–111)
Creatinine, Ser: 1 mg/dL (ref 0.61–1.24)
GFR, Estimated: >60 mL/min (ref >60)
Glucose, Bld: 152 mg/dL — ABNORMAL HIGH (ref 70–99)
Potassium: 4.7 mmol/L (ref 3.5–5.1)
Sodium: 136 mmol/L (ref 135–145)
Total Bilirubin: 0.3 mg/dL (ref 0.3–1.2)
Total Protein: 5.7 g/dL — ABNORMAL LOW (ref 6.5–8.1)

## 2022-01-02 LAB — CBC WITH DIFFERENTIAL/PLATELET
Abs Immature Granulocytes: 0.22 10*3/uL — ABNORMAL HIGH (ref 0.00–0.07)
Basophils Absolute: 0 10*3/uL (ref 0.0–0.1)
Basophils Relative: 0 %
Eosinophils Absolute: 0 10*3/uL (ref 0.0–0.5)
Eosinophils Relative: 0 %
HCT: 41.1 % (ref 39.0–52.0)
Hemoglobin: 13.2 g/dL (ref 13.0–17.0)
Immature Granulocytes: 1 %
Lymphocytes Relative: 8 %
Lymphs Abs: 1.5 10*3/uL (ref 0.7–4.0)
MCH: 28.5 pg (ref 26.0–34.0)
MCHC: 32.1 g/dL (ref 30.0–36.0)
MCV: 88.8 fL (ref 80.0–100.0)
Monocytes Absolute: 0.5 10*3/uL (ref 0.1–1.0)
Monocytes Relative: 3 %
Neutro Abs: 16.7 10*3/uL — ABNORMAL HIGH (ref 1.7–7.7)
Neutrophils Relative %: 88 %
Platelets: 275 10*3/uL (ref 150–400)
RBC: 4.63 MIL/uL (ref 4.22–5.81)
RDW: 13.3 % (ref 11.5–15.5)
WBC: 18.9 10*3/uL — ABNORMAL HIGH (ref 4.0–10.5)
nRBC: 0 % (ref 0.0–0.2)

## 2022-01-02 LAB — BRAIN NATRIURETIC PEPTIDE: B Natriuretic Peptide: 16.9 pg/mL (ref 0.0–100.0)

## 2022-01-02 LAB — CULTURE, BLOOD (ROUTINE X 2): Culture: NO GROWTH

## 2022-01-02 LAB — MAGNESIUM: Magnesium: 2.3 mg/dL (ref 1.7–2.4)

## 2022-01-02 MED ORDER — ASPIRIN EC 81 MG PO TBEC: 81.0000 mg | DELAYED_RELEASE_TABLET | Freq: Every day | ORAL | 0 refills | Status: DC

## 2022-01-02 MED ORDER — IPRATROPIUM-ALBUTEROL 0.5-2.5 (3) MG/3ML IN SOLN: 3.0000 mL | Freq: Two times a day (BID) | RESPIRATORY_TRACT | 0 refills | Status: DC

## 2022-01-02 MED ORDER — ALBUTEROL SULFATE (2.5 MG/3ML) 0.083% IN NEBU: 2.5000 mg | INHALATION_SOLUTION | RESPIRATORY_TRACT | 12 refills | Status: DC | PRN

## 2022-01-02 MED ORDER — PREDNISONE 5 MG PO TABS: ORAL_TABLET | ORAL | 0 refills | Status: DC

## 2022-01-02 NOTE — Plan of Care (Signed)

## 2022-01-02 NOTE — Plan of Care (Signed)
Pt has required oxygen throughout shift.  Continues to have cough.  Medicated per [facility] for cough.  Pt also reported a headache and was medicated per [facility] for that as well.  Pt slept well throughout shift.  ?Problem: Clinical Measurements: ?Goal: Respiratory complications will improve ?Outcome: Progressing ?  ?

## 2022-01-02 NOTE — Progress Notes (Signed)
Per order, patient discharged. PIV removed. Patient transported via wheelchair to front entrance of hospital. Patient left hospital in [place] with his sister-in-law.  ?

## 2022-01-02 NOTE — Progress Notes (Signed)
SATURATION QUALIFICATIONS: (This note is used to comply with regulatory documentation for home oxygen) ? ?Patient Saturations on Room Air at Rest = 94% ? ?Patient Saturations on Room Air while Ambulating = 98% ? ?Patient Saturations on 2 Liters of oxygen while Ambulating = 100 % ? ?Please briefly explain why patient needs home oxygen: patient has visible signs of labored breathing evidenced by increased respirations of 23-26. Patient also experiences an increase in cough. Patient also verbalizes he is short of breath.  ? ? ? ? ?

## 2022-01-02 NOTE — Care Management (Signed)
Patient does not qualify for home oxygen, ambulating 98%RA. Notified attending Dr Candiss Norse. ?Spoke with patient and he has a nebulizer at home ?No other TOC needs identified for DC ?

## 2022-01-02 NOTE — Discharge Summary (Addendum)
?                                                                                ? ?Arthur [name]. HTD:[id number] DOB: 19-Jul-1941 DOA: 12/27/2021 ? ?PCP: Eulogio Bear, NP ? ?Admit date: 12/27/2021  Discharge date: 01/02/2022 ? ?Admitted From: Home   Disposition:  Home ? ? ?Recommendations for Outpatient Follow-up:  ? ?Follow up with PCP in 1-2 weeks ? ?PCP Please obtain BMP/CBC, 2 view CXR in 1week,  (see Discharge instructions)  ? ?PCP Please follow up on the following pending results: Please review CT results will require close monitoring of his thoracic aneurysm, also please make sure that patient gets his nebulizer medications.  Close outpatient pulmonary and cardiology follow-up postdischarge. ? ? ?Home Health: None ?Equipment/Devices: 2 L nasal cannula oxygen if he qualifies today although he is saturating very well on room air, along with nebulizer machine ?Consultations: None  ?Discharge Condition: Stable    ?CODE STATUS: Full    ?Diet Recommendation: Heart Healthy  ? ?Diet Order   ? ?       ?  Diet - low sodium heart healthy       ?  ?  Diet Heart Room service appropriate? Yes; Fluid consistency: Thin  Diet effective now       ?  ? ?  ?  ? ?  ?  ? ?Chief Complaint  ?Patient presents with  ? Shortness of Breath  ? Cough  ?  ? ?Brief history of present illness from the day of admission and additional interim summary   ? ?81 year old M with PMH of COPD, systolic CHF/NICM with recovered EF, prostate cancer s/p prostatectomy, thyroid cancer s/p thyroidectomy, hypothyroidism, HTN, HLD and GERD presenting with ongoing productive cough and shortness of breath for over a week that did not improve despite outpatient treatment with Z-Pak and prednisone by his pulmonologist, and admitted for COPD exacerbation. ? ?                                                               Hospital Course  ? ?Acute Hypoxic Resp. Failure  due to Acute on chronic COPD exacerbation - he is already failed outpatient azithromycin and prednisone treatment, he was placed on IV steroids along with oral Levaquin, nebulizer treatments and oxygen.  He gradually improved and now much better and down to  RA even on ambulation, overall minimal to no wheezing now, close to his baseline will be discharged home on nebulizer treatments along with oral steroid taper with close outpatient pulmonary and PCP follow-up, request PCP to please make sure he is able to get his nebulizer medicines postdischarge and that he follows with pulmonary on a close basis. ?  ?2.  Chronic diastolic CHF.  EF now 50% this year.  Continue combination of ASA, Statin, Coreg and Entresto.  Echocardiogram was reviewed which was unchanged from prior with some hypokinesis and EF around 50% .  He  will require close outpatient cardiology follow-up and may benefit from outpatient stress test but will defer this to PCP and primary cardiologist. ?  ?3.  GERD.  PPI. ?  ?4.  Dyslipidemia.  On statin. ?  ?5.  Hypothyroidism.  On Synthroid continue. ?  ?6.  Reactionary leukocytosis due to combination of COPD exacerbation and steroid use.  Monitor afebrile, stable procalcitonin. ?  ?7. 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  Continue beta-blocker outpatient cardiothoracic surgery follow-up to be arranged by PCP. ? ? ?Discharge diagnosis   ? ? ?Principal Problem: ?  COPD exacerbation ([place]) ?Active Problems: ?  Essential hypertension ?  Chronic combined systolic and diastolic CHF (congestive heart failure) ([place]) ?  Hypothyroidism (acquired) ?  HYPERCHOLESTEROLEMIA ?  GERD (gastroesophageal reflux disease) ?  Acute on chronic respiratory failure with hypoxia ([facility]) ? ? ? ?Discharge instructions   ? ?Discharge Instructions   ? ? Diet - low sodium heart healthy   Complete by: As directed ?  ? Discharge instructions   Complete by: As directed ?  ? Follow  with Primary MD [name], MD in 7 days , review your CT scan reports with your PCP in detail in 1 week. ? ?Get CBC, CMP, 2 view Chest X ray -  checked next visit within 1 week by Primary MD  ? ?Activity: As tolerated with Full fall precautions use walker/cane & assistance as needed ? ?Disposition Home   ? ?Diet: Heart Healthy   ? ?Special Instructions: If you have smoked or chewed Tobacco  in the last 2 yrs please stop smoking, stop any regular Alcohol  and or any Recreational drug use. ? ?On your next visit with your primary care physician please Get Medicines reviewed and adjusted. ? ?Please request your Prim.MD to go over all Hospital Tests and Procedure/Radiological results at the follow up, please get all Hospital records sent to your Prim MD by signing hospital release before you go home. ? ?If you experience worsening of your admission symptoms, develop shortness of breath, life threatening emergency, suicidal or homicidal thoughts you must seek medical attention immediately by calling 911 or calling your MD immediately  if symptoms less severe. ? ?You Must read complete instructions/literature along with all the possible adverse reactions/side effects for all the Medicines you take and that have been prescribed to you. Take any new Medicines after you have completely understood and accpet all the possible adverse reactions/side effects.  ? Increase activity slowly   Complete by: As directed ?  ? ?  ? ? ?Discharge Medications  ? ?Allergies as of 01/02/2022   ? ?   Reactions  ? Doxycycline Other (See Comments)  ? Esophagitis, severe gi bleed  ? ?  ? ?  ?Medication List  ?  ? ?STOP taking these medications   ? ?azithromycin 250 MG tablet ?Commonly known as: ZITHROMAX ?  ? ?  ? ?TAKE these medications   ? ?albuterol 108 (90 Base) MCG/ACT inhaler ?Commonly known as: VENTOLIN HFA ?Inhale 1-2 puffs into the lungs every 6 (six) hours as needed for wheezing or shortness of breath. ?What changed: Another  medication with the same name was added. Make sure you understand how and when to take each. ?  ?albuterol (2.5 MG/3ML) 0.083% nebulizer solution ?Commonly known as: PROVENTIL ?Take 3 mLs (2.5 mg total) by nebulization every 2 (two) hours as needed for shortness of breath. ?What changed: You were already taking a  medication with the same name, and this prescription was added. Make sure you understand how and when to take each. ?  ?aspirin EC 81 MG tablet ?Take 1 tablet (81 mg total) by mouth daily. ?  ?atorvastatin 40 MG tablet ?Commonly known as: LIPITOR ?TAKE 1 TABLET BY MOUTH EVERY DAY ?  ?carvedilol 6.25 MG tablet ?Commonly known as: COREG ?TAKE 1 TABLET(6.25 MG) BY MOUTH TWICE DAILY WITH A MEAL ?What changed: See the new instructions. ?  ?Entresto 49-51 MG ?Generic drug: sacubitril-valsartan ?Take 1 tablet by mouth 2 (two) times daily. ?  ?ipratropium-albuterol 0.5-2.5 (3) MG/3ML Soln ?Commonly known as: DUONEB ?Take 3 mLs by nebulization 2 (two) times daily. ?  ?levothyroxine 200 MCG tablet ?Commonly known as: SYNTHROID ?Take 1 tablet (200 mcg total) by mouth daily before breakfast. ?  ?pantoprazole 40 MG tablet ?Commonly known as: PROTONIX ?TAKE 1 TABLET(40 MG) BY MOUTH DAILY BEFORE BREAKFAST ?What changed:  ?how much to take ?how to take this ?when to take this ?  ?predniSONE 5 MG tablet ?Commonly known as: DELTASONE ?Label  & dispense according to the schedule below. take 8 Pills PO for 3 days, 6 Pills PO for 3 days, 4 Pills PO for 3 days, 2 Pills PO for 3 days, 1 Pills PO for 3 days, 1/2 Pill  PO for 3 days then STOP. Total 65 pills. ?  ?Trelegy Ellipta 100-62.5-25 MCG/ACT Aepb ?Generic drug: Fluticasone-Umeclidin-Vilant ?INHALE 1 PUFF INTO THE LUNGS DAILY ?  ? ?  ? ?  ?  ? ? ?  ?Durable Medical Equipment  ?(From admission, onward)  ?  ? ? ?  ? ?  Start     Ordered  ? 01/02/22 0853  For home use only DME Nebulizer machine  Once       ?Question Answer Comment  ?Patient needs a nebulizer to treat with the  following condition COPD (chronic obstructive pulmonary disease) ([place])   ?Length of Need 6 Months   ?  ? 01/02/22 0852  ? 01/02/22 0853  For home use only DME Nebulizer/meds  Once       ?Question Answer Comment

## 2022-01-10 ENCOUNTER — Ambulatory Visit: Payer: PPO | Admitting: Nurse Practitioner

## 2022-01-11 ENCOUNTER — Encounter: Payer: Self-pay | Admitting: Nurse Practitioner

## 2022-01-11 ENCOUNTER — Ambulatory Visit: Payer: PPO | Admitting: Nurse Practitioner

## 2022-01-11 VITALS — BP 122/64 | HR 64 | Temp 98.7°F | Ht 72.0 in | Wt 240.6 lb

## 2022-01-11 DIAGNOSIS — R5383 Other fatigue: Secondary | ICD-10-CM | POA: Diagnosis not present

## 2022-01-11 DIAGNOSIS — J441 Chronic obstructive pulmonary disease with (acute) exacerbation: Secondary | ICD-10-CM

## 2022-01-11 DIAGNOSIS — J449 Chronic obstructive pulmonary disease, unspecified: Secondary | ICD-10-CM

## 2022-01-11 DIAGNOSIS — Z758 Other problems related to medical facilities and other health care: Secondary | ICD-10-CM | POA: Diagnosis not present

## 2022-01-11 DIAGNOSIS — I712 Thoracic aortic aneurysm, without rupture, unspecified: Secondary | ICD-10-CM | POA: Diagnosis not present

## 2022-01-11 MED ORDER — ALBUTEROL SULFATE (2.5 MG/3ML) 0.083% IN NEBU: 2.5000 mg | INHALATION_SOLUTION | Freq: Four times a day (QID) | RESPIRATORY_TRACT | 12 refills | Status: DC | PRN

## 2022-01-11 MED ORDER — GUAIFENESIN ER 600 MG PO TB12: 600.0000 mg | ORAL_TABLET | Freq: Two times a day (BID) | ORAL | 1 refills | Status: DC

## 2022-01-11 NOTE — Assessment & Plan Note (Signed)
Incidental finding on CT during hospitalization. Will need annual monitoring per recommendations. Discussed with pt at visit. Referral sent to new PCP who will follow this.  ?

## 2022-01-11 NOTE — Progress Notes (Signed)
? ?'@Patient'$  ID: Arthur Quince., male    DOB: 1941/02/26, 81 y.o.   MRN: [id number] ? ?Chief Complaint  ?Patient presents with  ? Hospitalization Follow-up  ?  Hospital follow up. Pt was seen for chronic res failure in the hospital with hypoxia. Pt states he is doing better since leaving the hospital.   ? ? ?Referring provider: ?Thurnell Lose, MD ? ?HPI: ?81 year old male, former smoker followed for centrilobular emphysema and lung nodules. He is a patient of Dr. Bari Mantis and last seen in office on 12/22/2021. He was recently hospitalized for AECOPD and acute respiratory failure from 12/27/2021-01/02/2022. Past medical history significant for HTN, non-ischemic cardiomyopathy, LBB, CHF, GERD, hypothyroid, OA, hx of prostate cancer, hx of thyroid cancer.  ? ?TEST/EVENTS:  ?01/01/2022 CT chest: Atherosclerosis with CAD.  Aneurysmal dilation of thoracic aorta measuring 3.8 cm.  Diffuse bronchial wall thickening in keeping with airway inflammation.  Peribronchial nodularity within the peripheral right lower lobe is nonspecific and can be seen in the setting of acute infectious bronchiolitis or may reflect the sequela of subacute aspiration or chronic atypical infection.  No confluent pulmonary infiltrate. ? ?12/22/2021: OV with Dr. Elsworth Soho. Worsening AECOPD. Tx with Depo 80 mg x 1 in office. Extended prednisone taper and z pack. CXR was without superimposed infection. RX sent for albuterol nebs. Contacted office a few days later with worsening symptoms - advised to go to the ED. ? ?12/27/2021-01/02/2022: Hospitalization for AECOPD, failed outpatient treatment. Treated with IV steroids and PO levaquin. Initially required O2 therapy but was weaned to RA prior to d/c. Close follow up outpatient.  ? ?01/11/2022: Today - follow up ?Patient presents today for hospital follow up. He reports feeling significantly better; although, he still is feeling somewhat fatigued and still has a congested cough. He does feel like both of these are  improving. He is now able to walk from the house, to the car, and back without being short of breath, which he was unable to do when he was first discharged and could barely go from his living room to his kitchen without having to hold onto the wall for support. His cough has also improved and is not as frequent as it was. It is less productive and when he does produce sputum, it is no longer purulent and more clear-white. He does still have some chest congestion that improves throughout the day. Denies any wheezing, fevers, hemoptysis, or lower extremity swelling. He continues on Trelegy daily and has not required his albuterol much in the past few days. He is still on his extended prednisone taper.  ? ?Allergies  ?Allergen Reactions  ? Doxycycline Other (See Comments)  ?  Esophagitis, severe gi bleed  ? ? ?Immunization History  ?Administered Date(s) Administered  ? Fluad Quad(high Dose 65+) 07/03/2019, 08/10/2020  ? Influenza Split 08/01/2011, 08/06/2012  ? Influenza, High Dose Seasonal PF 06/27/2016, 07/20/2018, 06/22/2021  ? Influenza,inj,Quad PF,6+ Mos 06/27/2013, 09/18/2014, 06/08/2015, 09/14/2017  ? Moderna Sars-Covid-2 Vaccination 12/23/2019, 01/06/2020  ? Pneumococcal Conjugate-13 10/20/2015  ? Pneumococcal Polysaccharide-23 01/10/2000, 10/26/2007  ? Tdap 04/26/2010  ? Zoster Recombinat (Shingrix) 05/10/2018, 07/20/2018  ? ? ?Past Medical History:  ?Diagnosis Date  ? Arthritis   ? Bifascicular block   ? a. 1st degree AVB/LBBB.  ? Cancer [facility])   ? thyroid and prostate  ? Cataract   ? Chronic systolic CHF (congestive heart failure) ([place])   ? COPD (chronic obstructive pulmonary disease) ([place])   ? ED (erectile dysfunction)   ?  Essential hypertension, benign   ? chris guess  pcp  ? GERD (gastroesophageal reflux disease)   ? Hypercholesterolemia   ? Kidney stone   ? renal stone  ? LBBB (left bundle branch block) 10/2007  ? Migraines   ? Mild dietary indigestion   ? NICM (nonischemic cardiomyopathy) ([place])   ?  Nonischemic cardiomyopathy ([place])   ? Prostate cancer ([place])   ? Reflux esophagitis   ? Reflux esophagitis   ? Shortness of breath dyspnea   ? W/ EXERTION   ? Spider bite   ? Thyroid cancer ([place])   ? Tobacco use disorder   ? Unspecified hypothyroidism   ? ? ?Tobacco History: ?Social History  ? ?Tobacco Use  ?Smoking Status Former  ? Packs/day: 0.10  ? Years: 52.00  ? Pack years: 5.20  ? Types: Cigarettes  ? Quit date: 04/04/2016  ? Years since quitting: 5.7  ?Smokeless Tobacco Never  ?Tobacco Comments  ? Smoked 2 cigs this weekend after 2-3 months   ? ?Counseling given: Not Answered ?Tobacco comments: Smoked 2 cigs this weekend after 2-3 months  ? ? ?Outpatient Medications Prior to Visit  ?Medication Sig Dispense Refill  ? albuterol (VENTOLIN HFA) 108 (90 Base) MCG/ACT inhaler Inhale 1-2 puffs into the lungs every 6 (six) hours as needed for wheezing or shortness of breath. 18 g 3  ? atorvastatin (LIPITOR) 40 MG tablet TAKE 1 TABLET BY MOUTH EVERY DAY (Patient taking differently: Take 40 mg by mouth daily.) 90 tablet 3  ? carvedilol (COREG) 6.25 MG tablet TAKE 1 TABLET(6.25 MG) BY MOUTH TWICE DAILY WITH A MEAL (Patient taking differently: Take 6.25 mg by mouth 2 (two) times daily with a meal.) 180 tablet 3  ? levothyroxine (SYNTHROID) 200 MCG tablet Take 1 tablet (200 mcg total) by mouth daily before breakfast. 90 tablet 1  ? pantoprazole (PROTONIX) 40 MG tablet TAKE 1 TABLET(40 MG) BY MOUTH DAILY BEFORE BREAKFAST (Patient taking differently: Take 40 mg by mouth daily. TAKE 1 TABLET(40 MG) BY MOUTH DAILY BEFORE BREAKFAST) 90 tablet 3  ? sacubitril-valsartan (ENTRESTO) 49-51 MG Take 1 tablet by mouth 2 (two) times daily. 60 tablet 11  ? TRELEGY ELLIPTA 100-62.5-25 MCG/ACT AEPB INHALE 1 PUFF INTO THE LUNGS DAILY (Patient taking differently: Inhale 1 puff into the lungs daily.) 60 each 5  ? albuterol (PROVENTIL) (2.5 MG/3ML) 0.083% nebulizer solution Take 3 mLs (2.5 mg total) by nebulization every 2 (two) hours as needed  for shortness of breath. (Patient not taking: Reported on 01/11/2022) 75 mL 12  ? aspirin EC 81 MG tablet Take 1 tablet (81 mg total) by mouth daily. 30 tablet 0  ? ipratropium-albuterol (DUONEB) 0.5-2.5 (3) MG/3ML SOLN Take 3 mLs by nebulization 2 (two) times daily. (Patient not taking: Reported on 01/11/2022) 360 mL 0  ? predniSONE (DELTASONE) 5 MG tablet Label  & dispense according to the schedule below. take 8 Pills PO for 3 days, 6 Pills PO for 3 days, 4 Pills PO for 3 days, 2 Pills PO for 3 days, 1 Pills PO for 3 days, 1/2 Pill  PO for 3 days then STOP. Total 65 pills. 65 tablet 0  ? ?No facility-administered medications prior to visit.  ? ? ? ?Review of Systems:  ? ?Constitutional: No weight loss or gain, night sweats, fevers, chills. +fatigue (improving) ?HEENT: No headaches, difficulty swallowing, tooth/dental problems, or sore throat. No sneezing, itching, ear ache, nasal congestion, or post nasal drip ?CV:  No chest pain, orthopnea, PND, swelling  in lower extremities, anasarca, dizziness, palpitations, syncope ?Resp: +shortness of breath with exertion (significantly improved); congested, minimally productive cough (improving). No excess mucus or change in color of mucus. No hemoptysis. No wheezing.  No chest wall deformity ?GI:  No heartburn, indigestion, abdominal pain, nausea, vomiting, diarrhea, change in bowel habits, loss of appetite, bloody stools.  ?Skin: No rash, lesions, ulcerations ?MSK:  No joint pain or swelling.  No decreased range of motion.  No back pain. ?Neuro: No dizziness or lightheadedness.  ?Psych: No depression or anxiety. Mood stable.  ? ? ? ?Physical Exam: ? ?BP 122/64 (BP Location: Left Arm, Patient Position: Sitting, Cuff Size: Normal)   Pulse 64   Temp 98.7 ?F (37.1 ?C) (Oral)   Ht 6' (1.829 m)   Wt 240 lb 9.6 oz (109.1 kg)   SpO2 96%   BMI 32.63 kg/m?  ? ?GEN: Pleasant, interactive, well-appearing; obese; in no acute distress. ?HEENT:  Normocephalic and atraumatic. PERRLA.  Sclera white. Nasal turbinates pink, moist and patent bilaterally. No rhinorrhea present. Oropharynx pink and moist, without exudate or edema. No lesions, ulcerations, or postnasal drip.  ?NECK:  Supple w/ fair R

## 2022-01-11 NOTE — Patient Instructions (Addendum)
-  Stop Trelegy. Trial Breztri 2 puffs Twice daily. Brush tongue and rinse mouth afterwards. If you feel this helps with your breathing, let me know and I will send a prescription in for you.  ?-Continue Albuterol inhaler 2 puffs or 3 mL neb every 6 hours as needed for shortness of breath or wheezing. Notify if symptoms persist despite rescue inhaler/neb use. ?-Continue protonix 40 mg daily  ?-Complete prednisone taper as previously directed ? ?-Mucinex 600 mg Twice daily for chest congestion/cough ?-Flutter valve 2-3 times a day  ? ?Follow up in 3-4 weeks with Dr. Elsworth Soho or Alanson Aly If symptoms do not improve or worsen, please contact office for sooner follow up or seek emergency care. ?

## 2022-01-11 NOTE — Assessment & Plan Note (Signed)
Improving. Advised to monitor and notify if he does not return to his baseline or worsens so we can perform further workup. Suspect r/t deconditioning from hospitalization and slowly resolving severe AECOPD.  ?

## 2022-01-11 NOTE — Assessment & Plan Note (Addendum)
Resolving AECOPD. Significant improvement in respiratory symptoms. Still on extended prednisone taper. Trial switch from Trelegy to [employer]. Concerned unable to generate airflow for DPI. Mucociliary clearance therapies. Advised him to notify if symptoms do not continue to improve or worsen. Close follow up ? ?Patient Instructions  ?-Stop Trelegy. Trial Breztri 2 puffs Twice daily. Brush tongue and rinse mouth afterwards. If you feel this helps with your breathing, let me know and I will send a prescription in for you.  ?-Continue Albuterol inhaler 2 puffs or 3 mL neb every 6 hours as needed for shortness of breath or wheezing. Notify if symptoms persist despite rescue inhaler/neb use. ?-Continue protonix 40 mg daily  ?-Complete prednisone taper as previously directed ? ?-Mucinex 600 mg Twice daily for chest congestion/cough ?-Flutter valve 2-3 times a day  ? ?Follow up in 3-4 weeks with Dr. Elsworth Soho or Alanson Aly If symptoms do not improve or worsen, please contact office for sooner follow up or seek emergency care. ? ? ?

## 2022-01-17 ENCOUNTER — Encounter (HOSPITAL_COMMUNITY): Payer: Self-pay | Admitting: Emergency Medicine

## 2022-01-17 ENCOUNTER — Emergency Department (HOSPITAL_COMMUNITY): Payer: PPO

## 2022-01-17 ENCOUNTER — Telehealth: Payer: Self-pay | Admitting: Cardiovascular Disease

## 2022-01-17 ENCOUNTER — Other Ambulatory Visit: Payer: Self-pay

## 2022-01-17 ENCOUNTER — Emergency Department (HOSPITAL_COMMUNITY)
Admission: EM | Admit: 2022-01-17 | Discharge: 2022-01-17 | Disposition: A | Discharge status: Home / Self Care | Payer: PPO | Attending: Emergency Medicine | Admitting: Emergency Medicine

## 2022-01-17 DIAGNOSIS — D171 Benign lipomatous neoplasm of skin and subcutaneous tissue of trunk: Secondary | ICD-10-CM | POA: Diagnosis not present

## 2022-01-17 DIAGNOSIS — R0602 Shortness of breath: Secondary | ICD-10-CM | POA: Insufficient documentation

## 2022-01-17 DIAGNOSIS — R197 Diarrhea, unspecified: Secondary | ICD-10-CM | POA: Insufficient documentation

## 2022-01-17 DIAGNOSIS — I509 Heart failure, unspecified: Secondary | ICD-10-CM | POA: Diagnosis not present

## 2022-01-17 DIAGNOSIS — Z7951 Long term (current) use of inhaled steroids: Secondary | ICD-10-CM | POA: Insufficient documentation

## 2022-01-17 DIAGNOSIS — R112 Nausea with vomiting, unspecified: Secondary | ICD-10-CM | POA: Diagnosis not present

## 2022-01-17 DIAGNOSIS — R109 Unspecified abdominal pain: Secondary | ICD-10-CM | POA: Diagnosis not present

## 2022-01-17 DIAGNOSIS — R079 Chest pain, unspecified: Secondary | ICD-10-CM | POA: Diagnosis not present

## 2022-01-17 DIAGNOSIS — R61 Generalized hyperhidrosis: Secondary | ICD-10-CM | POA: Insufficient documentation

## 2022-01-17 DIAGNOSIS — I714 Abdominal aortic aneurysm, without rupture, unspecified: Secondary | ICD-10-CM | POA: Diagnosis not present

## 2022-01-17 DIAGNOSIS — S299XXA Unspecified injury of thorax, initial encounter: Secondary | ICD-10-CM | POA: Diagnosis not present

## 2022-01-17 DIAGNOSIS — N200 Calculus of kidney: Secondary | ICD-10-CM | POA: Diagnosis not present

## 2022-01-17 DIAGNOSIS — I712 Thoracic aortic aneurysm, without rupture, unspecified: Secondary | ICD-10-CM | POA: Diagnosis not present

## 2022-01-17 DIAGNOSIS — I110 Hypertensive heart disease with heart failure: Secondary | ICD-10-CM | POA: Diagnosis not present

## 2022-01-17 DIAGNOSIS — J449 Chronic obstructive pulmonary disease, unspecified: Secondary | ICD-10-CM | POA: Diagnosis not present

## 2022-01-17 DIAGNOSIS — I700 Atherosclerosis of aorta: Secondary | ICD-10-CM | POA: Diagnosis not present

## 2022-01-17 DIAGNOSIS — R0789 Other chest pain: Secondary | ICD-10-CM | POA: Diagnosis not present

## 2022-01-17 LAB — I-STAT CHEM 8, ED
BUN: 21 mg/dL (ref 8–23)
Calcium, Ion: 1.21 mmol/L (ref 1.15–1.40)
Chloride: 104 mmol/L (ref 98–111)
Creatinine, Ser: 1.1 mg/dL (ref 0.61–1.24)
Glucose, Bld: 107 mg/dL — ABNORMAL HIGH (ref 70–99)
HCT: 39 % (ref 39.0–52.0)
Hemoglobin: 13.3 g/dL (ref 13.0–17.0)
Potassium: 4.5 mmol/L (ref 3.5–5.1)
Sodium: 140 mmol/L (ref 135–145)
TCO2: 29 mmol/L (ref 22–32)

## 2022-01-17 LAB — TROPONIN I (HIGH SENSITIVITY)
Troponin I (High Sensitivity): 6 ng/L (ref <18)
Troponin I (High Sensitivity): 5 ng/L (ref <18)

## 2022-01-17 LAB — CBC
HCT: 40.5 % (ref 39.0–52.0)
Hemoglobin: 12.6 g/dL — ABNORMAL LOW (ref 13.0–17.0)
MCH: 28.8 pg (ref 26.0–34.0)
MCHC: 31.1 g/dL (ref 30.0–36.0)
MCV: 92.7 fL (ref 80.0–100.0)
Platelets: 179 10*3/uL (ref 150–400)
RBC: 4.37 MIL/uL (ref 4.22–5.81)
RDW: 14.1 % (ref 11.5–15.5)
WBC: 8.3 10*3/uL (ref 4.0–10.5)
nRBC: 0 % (ref 0.0–0.2)

## 2022-01-17 LAB — BASIC METABOLIC PANEL
Anion gap: 7 (ref 5–15)
BUN: 19 mg/dL (ref 8–23)
CO2: 27 mmol/L (ref 22–32)
Calcium: 9.4 mg/dL (ref 8.9–10.3)
Chloride: 107 mmol/L (ref 98–111)
Creatinine, Ser: 0.99 mg/dL (ref 0.61–1.24)
GFR, Estimated: >60 mL/min (ref >60)
Glucose, Bld: 102 mg/dL — ABNORMAL HIGH (ref 70–99)
Potassium: 4.4 mmol/L (ref 3.5–5.1)
Sodium: 141 mmol/L (ref 135–145)

## 2022-01-17 MED ORDER — IOHEXOL 350 MG/ML SOLN
100.0000 mL | Freq: Once | INTRAVENOUS | Status: AC | PRN
  Administered 2022-01-17: 100 mL via INTRAVENOUS

## 2022-01-17 NOTE — Discharge Instructions (Addendum)
Return to the ED with any new symptoms such as increased chest pain, shortness of breath, leg swelling ?Please follow-up with the PCP as we discussed this Thursday ?Please follow-up with your cardiology team next month as we discussed ?Please also follow-up with the pulmonology team for further management of your pulmonary nodule ? ?

## 2022-01-17 NOTE — Telephone Encounter (Signed)
? ?  Tish Frederickson NP [phone]  Joneen Roach N 4 minutes ago (12:26 PM)  ? ?[place]   ?  ?Brandy with [facility] is seeing the pt today for a post hosp for recent COPD/ Emphysema/ Bronchitis... he is very SOB in the office, he cannot walk across the office and short area before he gives out... he has had pressure type pain for the last 2 days including this morning that has led to him to vomit this morning... she has recommended that he goes to the ED but wanted to call and be sure we do not have any different recommendations... I have advised her that he should go to the ED... last Cath was 2017. ? ?She says she will arrange transport to the ED.  ?

## 2022-01-17 NOTE — ED Provider Triage Note (Signed)
Emergency Medicine Provider Triage Evaluation Note ? ?[name] , a 81 y.o. male  was evaluated in triage.  Pt complains of substernal chest heaviness that began last night around 2 AM. Pt also complains of SOB and nausea with vomiting. His [place] came and evaluated him today and told him to come to the ED for further eval but he didn't want to. His [place] called his cardiologist who recommend he come here for further eval. He does report hx of thoracic aortic aneurysm.  ? ?Review of Systems  ?Positive: + Chest pain, SOB, nausea, vomiting, diaphoresis ?Negative:  ? ?Physical Exam  ?BP 122/72 (BP Location: Right Arm)   Pulse 66   Temp 98.5 ?F (36.9 ?C) (Oral)   Resp (!) 22   SpO2 97%  ?Gen:   Awake, no distress   ?Resp:  Normal effort  ?MSK:   Moves extremities without difficulty  ?Other:   ? ?Medical Decision Making  ?Medically screening exam initiated at 2:37 PM.  Appropriate orders placed.  [name] was informed that the remainder of the evaluation will be completed by another provider, this initial triage assessment does not replace that evaluation, and the importance of remaining in the ED until their evaluation is complete. ? ? ?  ?Eustaquio Maize, PA-C ?01/17/22 1441 ? ?

## 2022-01-17 NOTE — ED Triage Notes (Signed)
Pt reports chest pain that started at 2am.  States it woke him up and felt like someone standing on chest.  Reports SOB, nausea, and vomiting.  States pain is almost gone now.  Home health RN called his cardiologist and told him to come to ED. ?

## 2022-01-17 NOTE — ED Provider Notes (Signed)
?[facility] ?Provider Note ? ? ?CSN: [id number] ?Arrival date & time: 01/17/22  1420 ? ?  ? ?History ?No chief complaint on file. ? ? ?Arthur [name]. is a 81 y.o. male with medical history of COPD, left bundle branch block, systolic congestive heart failure, GERD, prostate cancer, thyroid cancer, tobacco use.  Patient presents to ED for evaluation of chest pain.  Patient states that he [place]  Patient states that this morning, at 2 AM he was awoken from his sleep due to sudden onset chest pain that he described as "an elephant standing on my chest" along with diaphoresis, shortness of breath, nausea and vomiting and diarrhea.  Patient states that the episode of chest pain lasted for about an hour along with the nausea and then resolved on its own.  Patient reports that his home health nurse came to his residence and he explained this episode to her.  The patient continues that his home health nurse advised him he needed to be seen in the ER.  Patient states home health nurse called cardiologist who agreed with her. Patient states currently he has no active chest pain and denies any other symptoms at present.  Patient states that he was recently discharged from the facility due to COPD exacerbation and was sent to have outpatient follow-up with repeat chest x-ray which she never had conducted.  The patient is endorsing chest pain, shortness of breath, nausea, vomiting, diarrhea.  Patient denies any fevers, abdominal pain, lightheadedness, dizziness, weakness. ? ?HPI ? ?  ? ?Home Medications ?Prior to Admission medications   ?Medication Sig Start Date End Date Taking? Authorizing Provider  ?albuterol (VENTOLIN HFA) 108 (90 Base) MCG/ACT inhaler Inhale 1-2 puffs into the lungs every 6 (six) hours as needed for wheezing or shortness of breath. 12/14/21  Yes [name], MD  ?atorvastatin (LIPITOR) 40 MG tablet TAKE 1 TABLET BY  MOUTH EVERY DAY ?Patient taking differently: Take 40 mg by mouth daily. 12/07/21  Yes [name], MD  ?carvedilol (COREG) 6.25 MG tablet TAKE 1 TABLET(6.25 MG) BY MOUTH TWICE DAILY WITH A MEAL ?Patient taking differently: Take 6.25 mg by mouth 2 (two) times daily with a meal. 01/01/21  Yes [name], MD  ?levothyroxine (SYNTHROID) 200 MCG tablet Take 1 tablet (200 mcg total) by mouth daily before breakfast. 07/14/21  Yes Noemi Chapel A, NP  ?pantoprazole (PROTONIX) 40 MG tablet TAKE 1 TABLET(40 MG) BY MOUTH DAILY BEFORE BREAKFAST ?Patient taking differently: Take 40 mg by mouth daily. TAKE 1 TABLET(40 MG) BY MOUTH DAILY BEFORE BREAKFAST 05/10/21  Yes [name], MD  ?sacubitril-valsartan (ENTRESTO) 49-51 MG Take 1 tablet by mouth 2 (two) times daily. 04/14/21  Yes [name], MD  ?albuterol (PROVENTIL) (2.5 MG/3ML) 0.083% nebulizer solution Take 3 mLs (2.5 mg total) by nebulization every 6 (six) hours as needed for wheezing or shortness of breath. 01/11/22   Cobb, Karie Schwalbe, NP  ?guaiFENesin (MUCINEX) 600 MG 12 hr tablet Take 1 tablet (600 mg total) by mouth 2 (two) times daily. ?Patient not taking: Reported on 01/17/2022 01/11/22   Clayton Bibles, NP  ?TRELEGY ELLIPTA 100-62.5-25 MCG/ACT AEPB INHALE 1 PUFF INTO THE LUNGS DAILY ?Patient not taking: Reported on 01/17/2022 08/25/21   [name], MD  ?   ? ?Allergies    ?Doxycycline   ? ?Review of Systems   ?Review of Systems  ?Constitutional:  Negative for fever.  ?Respiratory:  Positive for shortness of breath.   ?Cardiovascular:  Positive for chest pain.  ?Gastrointestinal:  Positive for diarrhea, nausea and vomiting. Negative for abdominal pain.  ?Neurological:  Negative for dizziness, weakness and light-headedness.  ?All other systems reviewed and are negative. ? ?Physical Exam ?Updated Vital Signs ?BP (!) 156/93 (BP Location: Right Arm)   Pulse 65   Temp (!) 97.5 ?F (36.4 ?C) (Oral)   Resp 16   SpO2 99%  ?Physical Exam ?Vitals and  nursing note reviewed.  ?Constitutional:   ?   General: He is not in acute distress. ?   Appearance: Normal appearance. He is not ill-appearing, toxic-appearing or diaphoretic.  ?HENT:  ?   Head: Normocephalic and atraumatic.  ?   Nose: Nose normal.  ?   Mouth/Throat:  ?   Mouth: Mucous membranes are moist.  ?   Pharynx: Oropharynx is clear.  ?Eyes:  ?   Extraocular Movements: Extraocular movements intact.  ?   Conjunctiva/sclera: Conjunctivae normal.  ?   Pupils: Pupils are equal, round, and reactive to light.  ?Cardiovascular:  ?   Rate and Rhythm: Normal rate and regular rhythm.  ?Pulmonary:  ?   Effort: Pulmonary effort is normal.  ?   Breath sounds: Normal breath sounds.  ?Abdominal:  ?   General: Abdomen is flat.  ?   Palpations: Abdomen is soft.  ?   Tenderness: There is no abdominal tenderness.  ?Musculoskeletal:  ?   Cervical back: Normal range of motion and neck supple. No tenderness.  ?   Right lower leg: No edema.  ?   Left lower leg: No edema.  ?Skin: ?   General: Skin is warm and dry.  ?   Capillary Refill: Capillary refill takes less than 2 seconds.  ?Neurological:  ?   Mental Status: He is alert and oriented to person, place, and time.  ? ? ?ED Results / Procedures / Treatments   ?Labs ?(all labs ordered are listed, but only abnormal results are displayed) ?Labs Reviewed  ?BASIC METABOLIC PANEL - Abnormal; Notable for the following components:  ?    Result Value  ? Glucose, Bld 102 (*)   ? All other components within normal limits  ?CBC - Abnormal; Notable for the following components:  ? Hemoglobin 12.6 (*)   ? All other components within normal limits  ?I-STAT CHEM 8, ED - Abnormal; Notable for the following components:  ? Glucose, Bld 107 (*)   ? All other components within normal limits  ?TROPONIN I (HIGH SENSITIVITY)  ?TROPONIN I (HIGH SENSITIVITY)  ? ? ?EKG ?None ? ?Radiology ?DG Chest 2 View ? ?Result Date: 01/17/2022 ?CLINICAL DATA:  Chest pain EXAM: CHEST - 2 VIEW COMPARISON:  Chest x-rays  dated 12/31/2021, 12/29/2021 and 02/26/2016 FINDINGS: Bronchitic changes centrally. Lungs otherwise clear. No confluent opacity to suggest a developing pneumonia. No pleural effusion or pneumothorax is seen. Osseous structures about the chest are unremarkable. IMPRESSION: 1. No active cardiopulmonary disease. No evidence of pneumonia or pulmonary edema. 2. Chronic bronchitic changes. Electronically Signed   By: Franki Cabot M.D.   On: 01/17/2022 15:37  ? ?CT Angio Chest/Abd/Pel for Dissection W and/or W/WO ? ?Result Date: 01/17/2022 ?CLINICAL DATA:  Aortic aneurysm, known or suspected EXAM: CT ANGIOGRAPHY CHEST, ABDOMEN AND PELVIS TECHNIQUE: Non-contrast CT of the chest was initially obtained. Multidetector CT imaging through the chest, abdomen and pelvis was performed using the standard protocol during bolus administration of intravenous contrast.  Multiplanar reconstructed images and MIPs were obtained and reviewed to evaluate the vascular anatomy. RADIATION DOSE REDUCTION: This exam was performed according to the departmental dose-optimization program which includes automated exposure control, adjustment of the mA and/or kV according to patient size and/or use of iterative reconstruction technique. CONTRAST:  151m OMNIPAQUE IOHEXOL 350 MG/ML SOLN COMPARISON:  Chest x-ray 01/17/2022 trauma chest 06/28/2028 FINDINGS: CTA CHEST FINDINGS Cardiovascular: Preferential opacification of the thoracic aorta. Stable 3.7 cm focal aneurysmal dilatation of the mid aortic arch. No evidence of thoracic aortic aneurysm or dissection. No atherosclerotic plaque of the thoracic aorta. Normal heart size. No significant pericardial effusion. Left anterior descending coronary artery calcifications. The main pulmonary artery is normal in caliber. No central or segmental pulmonary embolus. Mediastinum/Nodes: No enlarged mediastinal, hilar, or axillary lymph nodes. Thyroid gland, trachea, and esophagus demonstrate no significant findings.  Lungs/Pleura: Biapical pleural/pulmonary scarring. Biapical paraseptal emphysematous changes. Mild centrilobular emphysematous changes. Interval development of a 1 cm nodular-like density (8:21) within the right

## 2022-01-17 NOTE — ED Notes (Signed)
Patient transported to X-ray 

## 2022-01-17 NOTE — Telephone Encounter (Signed)
NP is calling due to currently seeing the patient for a hospital f/u. Reports he is currently having Chest pressure, worsening SOB and one episode of vomiting. Wanting to know if pt can be seen today or if Nishan recommends him going to the ED. ?

## 2022-01-20 ENCOUNTER — Ambulatory Visit (INDEPENDENT_AMBULATORY_CARE_PROVIDER_SITE_OTHER): Payer: PPO | Admitting: Nurse Practitioner

## 2022-01-20 ENCOUNTER — Encounter: Payer: Self-pay | Admitting: Nurse Practitioner

## 2022-01-20 VITALS — BP 128/64 | HR 68 | Temp 97.5°F | Resp 18 | Ht 72.0 in | Wt 245.0 lb

## 2022-01-20 DIAGNOSIS — K219 Gastro-esophageal reflux disease without esophagitis: Secondary | ICD-10-CM | POA: Diagnosis not present

## 2022-01-20 DIAGNOSIS — I712 Thoracic aortic aneurysm, without rupture, unspecified: Secondary | ICD-10-CM | POA: Diagnosis not present

## 2022-01-20 DIAGNOSIS — R0789 Other chest pain: Secondary | ICD-10-CM

## 2022-01-20 DIAGNOSIS — Z79899 Other long term (current) drug therapy: Secondary | ICD-10-CM

## 2022-01-20 DIAGNOSIS — J432 Centrilobular emphysema: Secondary | ICD-10-CM

## 2022-01-20 DIAGNOSIS — I5030 Unspecified diastolic (congestive) heart failure: Secondary | ICD-10-CM | POA: Diagnosis not present

## 2022-01-20 DIAGNOSIS — I700 Atherosclerosis of aorta: Secondary | ICD-10-CM

## 2022-01-20 DIAGNOSIS — I10 Essential (primary) hypertension: Secondary | ICD-10-CM

## 2022-01-20 DIAGNOSIS — Z8546 Personal history of malignant neoplasm of prostate: Secondary | ICD-10-CM | POA: Diagnosis not present

## 2022-01-20 DIAGNOSIS — Z85850 Personal history of malignant neoplasm of thyroid: Secondary | ICD-10-CM | POA: Diagnosis not present

## 2022-01-20 DIAGNOSIS — E7800 Pure hypercholesterolemia, unspecified: Secondary | ICD-10-CM

## 2022-01-20 DIAGNOSIS — Z5181 Encounter for therapeutic drug level monitoring: Secondary | ICD-10-CM

## 2022-01-20 DIAGNOSIS — E039 Hypothyroidism, unspecified: Secondary | ICD-10-CM | POA: Diagnosis not present

## 2022-01-20 DIAGNOSIS — R918 Other nonspecific abnormal finding of lung field: Secondary | ICD-10-CM | POA: Diagnosis not present

## 2022-01-20 DIAGNOSIS — G43909 Migraine, unspecified, not intractable, without status migrainosus: Secondary | ICD-10-CM | POA: Diagnosis not present

## 2022-01-20 DIAGNOSIS — R5383 Other fatigue: Secondary | ICD-10-CM

## 2022-01-20 LAB — CBC
HCT: 39.4 % (ref 39.0–52.0)
Hemoglobin: 12.6 g/dL — ABNORMAL LOW (ref 13.0–17.0)
MCHC: 32 g/dL (ref 30.0–36.0)
MCV: 89.3 fl (ref 78.0–100.0)
Platelets: 171 10*3/uL (ref 150.0–400.0)
RBC: 4.42 Mil/uL (ref 4.22–5.81)
RDW: 15 % (ref 11.5–15.5)
WBC: 6.3 10*3/uL (ref 4.0–10.5)

## 2022-01-20 LAB — COMPREHENSIVE METABOLIC PANEL
ALT: 20 U/L (ref 0–53)
AST: 12 U/L (ref 0–37)
Albumin: 3.9 g/dL (ref 3.5–5.2)
Alkaline Phosphatase: 73 U/L (ref 39–117)
BUN: 18 mg/dL (ref 6–23)
CO2: 34 mEq/L — ABNORMAL HIGH (ref 19–32)
Calcium: 9.2 mg/dL (ref 8.4–10.5)
Chloride: 104 mEq/L (ref 96–112)
Creatinine, Ser: 1.08 mg/dL (ref 0.40–1.50)
GFR: 65.27 mL/min (ref >60.00)
Glucose, Bld: 124 mg/dL — ABNORMAL HIGH (ref 70–99)
Potassium: 4.2 mEq/L (ref 3.5–5.1)
Sodium: 144 mEq/L (ref 135–145)
Total Bilirubin: 0.7 mg/dL (ref 0.2–1.2)
Total Protein: 6.2 g/dL (ref 6.0–8.3)

## 2022-01-20 LAB — VITAMIN B12: Vitamin B-12: 209 pg/mL — ABNORMAL LOW (ref 211–911)

## 2022-01-20 LAB — MAGNESIUM: Magnesium: 1.7 mg/dL (ref 1.5–2.5)

## 2022-01-20 LAB — BRAIN NATRIURETIC PEPTIDE: Pro B Natriuretic peptide (BNP): 51 pg/mL (ref 0.0–100.0)

## 2022-01-20 LAB — TSH: TSH: 5.84 u[IU]/mL — ABNORMAL HIGH (ref 0.35–5.50)

## 2022-01-20 NOTE — Assessment & Plan Note (Signed)
Seems noncardiac in nature has been going on for extended period time.  Patient is recently in the emergency department with a chest x-ray, EKG, lab work including troponins that were negative.  Continue to monitor keep appointment with Dr. Jenkins Rouge cardiologist in May ?

## 2022-01-20 NOTE — Assessment & Plan Note (Signed)
Currently on atorvastatin 40 mg.  Followed by cardiology continue taking medication as prescribed follow-up with cardiology as recommended. ?

## 2022-01-20 NOTE — Assessment & Plan Note (Signed)
Margin complaint could be multifactorial.  Patient does have CHF along with COPD.  Also on a beta-blocker and thyroid medication.  We will check panel labs, pending lab result. ?

## 2022-01-20 NOTE — Assessment & Plan Note (Signed)
This was rectified with a total prostatectomy.  Patient no longer followed by urology ?

## 2022-01-20 NOTE — Assessment & Plan Note (Signed)
Currently being followed by Dr. Elsworth Soho pulmonology. ?

## 2022-01-20 NOTE — Assessment & Plan Note (Signed)
Patient is followed by Dr. Elsworth Soho pulmonology.  Patient currently on breztri, albuterol inhaler, albuterol nebulizer.  Patient states the pharmacy would not give him the nebulizer solution as he is already on albuterol inhaler.  Patient does have nebulizer machine at home.  We will reach out to pharmacy to see if she can be medicated. ?

## 2022-01-20 NOTE — Assessment & Plan Note (Signed)
This is secondary to thyroid cancer which required a thyroidectomy.  Patient currently maintained on 200 mcg of levothyroxine daily.  Pending lab results.  Continue taking medication as prescribed ?

## 2022-01-20 NOTE — Assessment & Plan Note (Signed)
Patient states he is aged out of the EGDs and colonoscopies.  Currently on Protonix 40 mg daily.  We will continue medication as prescribed ?

## 2022-01-20 NOTE — Assessment & Plan Note (Signed)
Currently well controlled as patient is on Entresto and carvedilol.  Continue medications follow-up with cardiology as recommended. ?

## 2022-01-20 NOTE — Patient Instructions (Signed)
Nice to see you today ?I will be in touch with the labs once I have them ?Follow up with me in 6 months, sooner if you need me ?Keep the appointment with Dr. Jenkins Rouge ?

## 2022-01-20 NOTE — Assessment & Plan Note (Signed)
Patient currently on atorvastatin 40 mg.  Patient is followed and managed by cardiology continue taking atorvastatin 40 mg follow-up as recommended. ?

## 2022-01-20 NOTE — Progress Notes (Signed)
? ?New Patient Office Visit ? ?Subjective   ? ?Patient ID: Arthur Quince., male    DOB: 1941-01-24  Age: 81 y.o. MRN: [id number] ? ?CC:  ?Chief Complaint  ?Patient presents with  ? Establish Care  ?  Previous PCP [facility]  ? ER follow up  ? ? ?HPI ?Arthur Quince. presents to establish care ?HF: sees Dr. Jenkins Rouge. [id number] last office visit and was cleared for a year until recent events.  Patient does have an appointment with [name] in May ?  ?Aortic atherosclerosis: Patient currently maintained on atorvastatin 40 mg from cardiologist. ? ?COPD: States that he will use the abluterol several times a day with weather and pollen. Does help.  States when he is not having flares with weather he rarely uses albuterol inhaler at all he is currently followed by Dr. Kara Mead from pulmonology and recently seen Delice Lesch, NP on 01/11/2022. ? ?Hypothyroid: Takes medication and does well.  He has acquired hypothyroidism due to thyroid cancer and a total thyroidectomy in the past. ? ?Chest pain/ED follow up: Woke up Tuesday morning with the feeling of an elephant on the chest. States that he was wringing with sweat. Home health came and checked his vitals. States that he went to the hospital on 01/17/2022 ?Dull pain on left side of chest. Activity does not mak e a difference. States that he has tried OTC treatments without relief. Thinks it is the way he sleeps.  Upon further vesication this left-sided dull chest pain has been present for years. ? ?SHOB/ Patient hospitalization follow-up: was admitted on 12/27/2021 and discharged on 01/02/2022 for acute hypoxic respiratory failure due to acute on chronic COPD exacerbation he had failed outpatient azithromycin and prednisone was placed on IV steroids along with oral Levaquin and nebulizer treatments and oxygen which she gradually improved and no longer requires oxygen currently.  It was noted that he had a 3.7 cm thoracic aorta aneurysm recommended rescan in 1  year. ? ?Urologist: does not follow since having prostate cancer and total prostatectomy.  Patient states he does have incontinence at times and will wear briefs splint ongoing for approximately 20 years ? ? ?Outpatient Encounter Medications as of 01/20/2022  ?Medication Sig  ? albuterol (PROVENTIL) (2.5 MG/3ML) 0.083% nebulizer solution Take 3 mLs (2.5 mg total) by nebulization every 6 (six) hours as needed for wheezing or shortness of breath.  ? albuterol (VENTOLIN HFA) 108 (90 Base) MCG/ACT inhaler Inhale 1-2 puffs into the lungs every 6 (six) hours as needed for wheezing or shortness of breath.  ? atorvastatin (LIPITOR) 40 MG tablet TAKE 1 TABLET BY MOUTH EVERY DAY (Patient taking differently: Take 40 mg by mouth daily.)  ? Budeson-Glycopyrrol-Formoterol (BREZTRI AEROSPHERE) 160-9-4.8 MCG/ACT AERO Inhale 2 puffs into the lungs in the morning and at bedtime. Per pulmonologist-was given samples  ? carvedilol (COREG) 6.25 MG tablet TAKE 1 TABLET(6.25 MG) BY MOUTH TWICE DAILY WITH A MEAL (Patient taking differently: Take 6.25 mg by mouth 2 (two) times daily with a meal.)  ? levothyroxine (SYNTHROID) 200 MCG tablet Take 1 tablet (200 mcg total) by mouth daily before breakfast.  ? pantoprazole (PROTONIX) 40 MG tablet TAKE 1 TABLET(40 MG) BY MOUTH DAILY BEFORE BREAKFAST (Patient taking differently: Take 40 mg by mouth daily. TAKE 1 TABLET(40 MG) BY MOUTH DAILY BEFORE BREAKFAST)  ? sacubitril-valsartan (ENTRESTO) 49-51 MG Take 1 tablet by mouth 2 (two) times daily.  ? [DISCONTINUED] guaiFENesin (MUCINEX) 600 MG 12 hr tablet  Take 1 tablet (600 mg total) by mouth 2 (two) times daily. (Patient not taking: Reported on 01/17/2022)  ? [DISCONTINUED] TRELEGY ELLIPTA 100-62.5-25 MCG/ACT AEPB INHALE 1 PUFF INTO THE LUNGS DAILY (Patient not taking: Reported on 01/17/2022)  ? ?No facility-administered encounter medications on file as of 01/20/2022.  ? ? ?Past Medical History:  ?Diagnosis Date  ? Arthritis   ? Bifascicular block   ? a.  1st degree AVB/LBBB.  ? Cancer [facility])   ? thyroid and prostate  ? Cataract   ? Chronic systolic CHF (congestive heart failure) ([place])   ? COPD (chronic obstructive pulmonary disease) ([place])   ? ED (erectile dysfunction)   ? Essential hypertension, benign   ? chris guess  pcp  ? GERD (gastroesophageal reflux disease)   ? Hypercholesterolemia   ? Kidney stone   ? renal stone  ? LBBB (left bundle branch block) 10/2007  ? Migraines   ? Mild dietary indigestion   ? NICM (nonischemic cardiomyopathy) ([place])   ? Nonischemic cardiomyopathy ([place])   ? Prostate cancer ([place])   ? Reflux esophagitis   ? Reflux esophagitis   ? Shortness of breath dyspnea   ? W/ EXERTION   ? Spider bite   ? Thyroid cancer ([place])   ? Tobacco use disorder   ? Unspecified hypothyroidism   ? ? ?Past Surgical History:  ?Procedure Laterality Date  ? CARDIAC CATHETERIZATION    ? 2009   no stents  ? CARDIAC CATHETERIZATION N/A 09/02/2016  ? Procedure: Left Heart Cath and Coronary Angiography;  Surgeon: [name], MD;  Location: [place] [facility];  Service: Cardiovascular;  Laterality: N/A;  ? CHOLECYSTECTOMY N/A 03/30/2017  ? Procedure: LAPAROSCOPIC CHOLECYSTECTOMY;  Surgeon: [name], MD;  Location: [place];  Service: General;  Laterality: N/A;  ? CHOLECYSTECTOMY  2018  ? COLONOSCOPY  Multiple  ? Adenomatous polyps  ? CYSTOSCOPY/RETROGRADE/URETEROSCOPY/STONE EXTRACTION WITH BASKET Left 09/24/2014  ? Procedure: CYSTOSCOPY/RETROGRADE/URETEROSCOPY/STONE EXTRACTION WITH BASKET FROM URETER AND KIDNEY/STENT PLACEMENT;  Surgeon: [name], MD;  Location: [facility];  Service: Urology;  Laterality: Left;  ? ESOPHAGOGASTRODUODENOSCOPY  Multiple  ? GERD  ? FOOT SURGERY    ? RIGHT   ? HERNIA REPAIR  1976  ? HOLMIUM LASER APPLICATION Left 63/84/6659  ? Procedure: HOLMIUM LASER APPLICATION;  Surgeon: [name], MD;  Location: [facility];  Service: Urology;  Laterality: Left;  ? NASAL SINUS SURGERY    ? NECK SURGERY    ? plates/screws from [place]  ? PROSTATECTOMY    ?  SINUS ENDO W/FUSION Bilateral 10/28/2015  ? Procedure: ENDOSCOPIC SINUS SURGERY WITH NAVIGATION;  Surgeon: [name], MD;  Location: [place];  Service: ENT;  Laterality: Bilateral;  ? THYROIDECTOMY    ? TOTAL KNEE ARTHROPLASTY    ? right  ? TOTAL KNEE ARTHROPLASTY  08/03/2012  ? Procedure: TOTAL KNEE ARTHROPLASTY;  Surgeon: [name], MD;  Location: [place];  Service: Orthopedics;  Laterality: Left;  ? UPPER GASTROINTESTINAL ENDOSCOPY    ? ? ?Family History  ?Problem Relation Age of Onset  ? Heart attack Father   ? Colon cancer Brother   ? Heart attack Brother   ? Skin cancer Brother   ? Diabetes Brother   ? Esophageal cancer Neg Hx   ? Rectal cancer Neg Hx   ? Stomach cancer Neg Hx   ? Colon polyps Neg Hx   ? ? ?Social History  ? ?Socioeconomic History  ? Marital status: Widowed  ?  Spouse name: Not on file  ? Number of children: 1  ? Years of education: Not on file  ? Highest education level: Not on file  ?Occupational History  ? Occupation: retired farming  ?Tobacco Use  ? Smoking status: Former  ?  Packs/day: 0.10  ?  Years: 52.00  ?  Pack years: 5.20  ?  Types: Cigarettes  ?  Quit date: 04/04/2016  ?  Years since quitting: 5.8  ? Smokeless tobacco: Never  ? Tobacco comments:  ?  Smoked 2 cigs this weekend after 2-3 months   ?Vaping Use  ? Vaping Use: Never used  ?Substance and Sexual Activity  ? Alcohol use: Yes  ?  Comment: rare  ? Drug use: No  ? Sexual activity: Not on file  ?Other Topics Concern  ? Not on file  ?Social History Narrative  ? Exercise walking  ? ?Social Determinants of Health  ? ?Financial Resource Strain: Not on file  ?Food Insecurity: Not on file  ?Transportation Needs: Not on file  ?Physical Activity: Not on file  ?Stress: Not on file  ?Social Connections: Not on file  ?Intimate Partner Violence: Not on file  ? ? ?Review of Systems  ?Constitutional:  Negative for chills and fever.  ?Respiratory:  Positive for shortness of breath. Negative for cough.   ?     No history of DVT  ?Cardiovascular:   Positive for chest pain and leg swelling (intermittent).  ?Gastrointestinal:  Negative for diarrhea, nausea and vomiting.  ?     BM daily ?  ?Genitourinary:  Negative for dysuria and frequency.  ?

## 2022-01-20 NOTE — Assessment & Plan Note (Signed)
Cervical diagnosis for patient.  States over the last year or so he has not had to do with any migraines.  We will give a headache on occasion that he get to abate with over-the-counter analgesics.  Continue to monitor ?

## 2022-01-20 NOTE — Assessment & Plan Note (Signed)
Patient currently being followed by Dr. Jenkins Rouge, allergy.  Currently maintained on Entresto 49-51 mg.  Continue take medication prescribed follow-up with [name] as scheduled in May ?

## 2022-01-20 NOTE — Assessment & Plan Note (Signed)
Patient has a 3.7 cm thoracic aortic aneurysm captured on CT angio from emergency room visit.  Seems stable per scan report.  Did discuss about having adequate blood pressure control and the fact that he has stopped smoking.  We will continue to monitor with recommended yearly scans ?

## 2022-01-20 NOTE — Assessment & Plan Note (Signed)
Rectified by a total thyroidectomy.  Patient currently maintained on levothyroxine 200 mcg daily. ?

## 2022-01-24 ENCOUNTER — Other Ambulatory Visit: Payer: Self-pay | Admitting: Nurse Practitioner

## 2022-01-24 DIAGNOSIS — J449 Chronic obstructive pulmonary disease, unspecified: Secondary | ICD-10-CM | POA: Diagnosis not present

## 2022-01-24 DIAGNOSIS — E039 Hypothyroidism, unspecified: Secondary | ICD-10-CM

## 2022-01-24 DIAGNOSIS — J432 Centrilobular emphysema: Secondary | ICD-10-CM | POA: Diagnosis not present

## 2022-01-25 ENCOUNTER — Other Ambulatory Visit: Payer: Self-pay | Admitting: Nurse Practitioner

## 2022-01-25 DIAGNOSIS — R7989 Other specified abnormal findings of blood chemistry: Secondary | ICD-10-CM

## 2022-02-07 ENCOUNTER — Other Ambulatory Visit: Payer: Self-pay | Admitting: Nurse Practitioner

## 2022-02-07 ENCOUNTER — Telehealth: Payer: Self-pay

## 2022-02-07 ENCOUNTER — Other Ambulatory Visit: Payer: Self-pay | Admitting: Cardiovascular Disease

## 2022-02-07 ENCOUNTER — Ambulatory Visit (INDEPENDENT_AMBULATORY_CARE_PROVIDER_SITE_OTHER): Payer: PPO

## 2022-02-07 VITALS — Ht 72.0 in | Wt 245.0 lb

## 2022-02-07 DIAGNOSIS — E039 Hypothyroidism, unspecified: Secondary | ICD-10-CM

## 2022-02-07 DIAGNOSIS — Z0000 Encounter for general adult medical examination without abnormal findings: Secondary | ICD-10-CM

## 2022-02-07 DIAGNOSIS — I10 Essential (primary) hypertension: Secondary | ICD-10-CM

## 2022-02-07 MED ORDER — LEVOTHYROXINE SODIUM 200 MCG PO TABS: 200.0000 ug | ORAL_TABLET | Freq: Every day | ORAL | 1 refills | Status: DC

## 2022-02-07 NOTE — Patient Instructions (Signed)
Arthur [name] , ?Thank you for taking time to come for your Medicare Wellness Visit. I appreciate your ongoing commitment to your health goals. Please review the following plan we discussed and let me know if I can assist you in the future.  ? ?Screening recommendations/referrals: ?Colonoscopy: Done 04/04/2018. No longer required.  ? ?Recommended yearly ophthalmology/optometry visit for glaucoma screening and checkup ?Recommended yearly dental visit for hygiene and checkup ? ?Vaccinations: ?Influenza vaccine: Done 06/22/2021. Repeat annually ? ?Pneumococcal vaccine: Done 49/2001, 10/26/2007, 10/20/2015. ?Tdap vaccine: Done 04/26/2010 ?Shingles vaccine: Done Done 07/20/2018 and 05/10/2018 ?Covid-19: Done 12/23/2019 and 01/06/2020   ? ?Advanced directives: Advance directive discussed with you today. Even though you declined this today, please call our office should you change your mind, and we can give you the proper paperwork for you to fill out. ? ? ?Conditions/risks identified: KEEP UP THE GOOD WORK!! ? ?Next appointment: Follow up in one year for your annual wellness visit. 2024, PHONE VISIT. ? ?Preventive Care 33 Years and Older, Male ? ?Preventive care refers to lifestyle choices and visits with your health care provider that can promote health and wellness. ?What does preventive care include? ?A yearly physical exam. This is also called an annual well check. ?Dental exams once or twice a year. ?Routine eye exams. Ask your health care provider how often you should have your eyes checked. ?Personal lifestyle choices, including: ?Daily care of your teeth and gums. ?Regular physical activity. ?Eating a healthy diet. ?Avoiding tobacco and drug use. ?Limiting alcohol use. ?Practicing safe sex. ?Taking low doses of aspirin every day. ?Taking vitamin and mineral supplements as recommended by your health care provider. ?What happens during an annual well check? ?The services and screenings done by your health care provider during  your annual well check will depend on your age, overall health, lifestyle risk factors, and family history of disease. ?Counseling  ?Your health care provider may ask you questions about your: ?Alcohol use. ?Tobacco use. ?Drug use. ?Emotional well-being. ?Home and relationship well-being. ?Sexual activity. ?Eating habits. ?History of falls. ?Memory and ability to understand (cognition). ?Work and work Statistician. ?Screening  ?You may have the following tests or measurements: ?Height, weight, and BMI. ?Blood pressure. ?Lipid and cholesterol levels. These may be checked every 5 years, or more frequently if you are over 67 years old. ?Skin check. ?Lung cancer screening. You may have this screening every year starting at age 72 if you have a 30-pack-year history of smoking and currently smoke or have quit within the past 15 years. ?Fecal occult blood test (FOBT) of the stool. You may have this test every year starting at age 1. ?Flexible sigmoidoscopy or colonoscopy. You may have a sigmoidoscopy every 5 years or a colonoscopy every 10 years starting at age 34. ?Prostate cancer screening. Recommendations will vary depending on your family history and other risks. ?Hepatitis C blood test. ?Hepatitis B blood test. ?Sexually transmitted disease (STD) testing. ?Diabetes screening. This is done by checking your blood sugar (glucose) after you have not eaten for a while (fasting). You may have this done every 1-3 years. ?Abdominal aortic aneurysm (AAA) screening. You may need this if you are a current or former smoker. ?Osteoporosis. You may be screened starting at age 43 if you are at high risk. ?Talk with your health care provider about your test results, treatment options, and if necessary, the need for more tests. ?Vaccines  ?Your health care provider may recommend certain vaccines, such as: ?Influenza vaccine. This is recommended every  year. ?Tetanus, diphtheria, and acellular pertussis (Tdap, Td) vaccine. You may need  a Td booster every 10 years. ?Zoster vaccine. You may need this after age 44. ?Pneumococcal 13-valent conjugate (PCV13) vaccine. One dose is recommended after age 81. ?Pneumococcal polysaccharide (PPSV23) vaccine. One dose is recommended after age 15. ?Talk to your health care provider about which screenings and vaccines you need and how often you need them. ?This information is not intended to replace advice given to you by your health care provider. Make sure you discuss any questions you have with your health care provider. ?Document Released: 10/16/2015 Document Revised: 06/08/2016 Document Reviewed: 07/21/2015 ?Elsevier Interactive Patient Education ? 2017 [place]. ? ?Fall Prevention in the Home ?Falls can cause injuries. They can happen to people of all ages. There are many things you can do to make your home safe and to help prevent falls. ?What can I do on the outside of my home? ?Regularly fix the edges of walkways and driveways and fix any cracks. ?Remove anything that might make you trip as you walk through a door, such as a raised step or threshold. ?Trim any bushes or trees on the path to your home. ?Use bright outdoor lighting. ?Clear any walking paths of anything that might make someone trip, such as rocks or tools. ?Regularly check to see if handrails are loose or broken. Make sure that both sides of any steps have handrails. ?Any raised decks and porches should have guardrails on the edges. ?Have any leaves, snow, or ice cleared regularly. ?Use sand or salt on walking paths during winter. ?Clean up any spills in your garage right away. This includes oil or grease spills. ?What can I do in the bathroom? ?Use night lights. ?Install grab bars by the toilet and in the tub and shower. Do not use towel bars as grab bars. ?Use non-skid mats or decals in the tub or shower. ?If you need to sit down in the shower, use a plastic, non-slip stool. ?Keep the floor dry. Clean up any water that spills on the  floor as soon as it happens. ?Remove soap buildup in the tub or shower regularly. ?Attach bath mats securely with double-sided non-slip rug tape. ?Do not have throw rugs and other things on the floor that can make you trip. ?What can I do in the bedroom? ?Use night lights. ?Make sure that you have a light by your bed that is easy to reach. ?Do not use any sheets or blankets that are too big for your bed. They should not hang down onto the floor. ?Have a firm chair that has side arms. You can use this for support while you get dressed. ?Do not have throw rugs and other things on the floor that can make you trip. ?What can I do in the kitchen? ?Clean up any spills right away. ?Avoid walking on wet floors. ?Keep items that you use a lot in easy-to-reach places. ?If you need to reach something above you, use a strong step stool that has a grab bar. ?Keep electrical cords out of the way. ?Do not use floor polish or wax that makes floors slippery. If you must use wax, use non-skid floor wax. ?Do not have throw rugs and other things on the floor that can make you trip. ?What can I do with my stairs? ?Do not leave any items on the stairs. ?Make sure that there are handrails on both sides of the stairs and use them. Fix handrails that are broken or  loose. Make sure that handrails are as long as the stairways. ?Check any carpeting to make sure that it is firmly attached to the stairs. Fix any carpet that is loose or worn. ?Avoid having throw rugs at the top or bottom of the stairs. If you do have throw rugs, attach them to the floor with carpet tape. ?Make sure that you have a light switch at the top of the stairs and the bottom of the stairs. If you do not have them, ask someone to add them for you. ?What else can I do to help prevent falls? ?Wear shoes that: ?Do not have high heels. ?Have rubber bottoms. ?Are comfortable and fit you well. ?Are closed at the toe. Do not wear sandals. ?If you use a stepladder: ?Make sure that  it is fully opened. Do not climb a closed stepladder. ?Make sure that both sides of the stepladder are locked into place. ?Ask someone to hold it for you, if possible. ?Clearly mark and make sure that yo

## 2022-02-07 NOTE — Telephone Encounter (Signed)
Refill sent in
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
## 2022-02-10 ENCOUNTER — Encounter: Payer: Self-pay | Admitting: Pulmonary Disease

## 2022-02-10 ENCOUNTER — Ambulatory Visit: Payer: PPO | Admitting: Pulmonary Disease

## 2022-02-10 DIAGNOSIS — I5042 Chronic combined systolic (congestive) and diastolic (congestive) heart failure: Secondary | ICD-10-CM | POA: Diagnosis not present

## 2022-02-10 DIAGNOSIS — J432 Centrilobular emphysema: Secondary | ICD-10-CM | POA: Diagnosis not present

## 2022-02-10 NOTE — Assessment & Plan Note (Signed)
On Entresto and is tolerating well ?

## 2022-02-10 NOTE — Patient Instructions (Addendum)
?  Continue on trelegy ? ? ?

## 2022-02-10 NOTE — Assessment & Plan Note (Signed)
Recovering from recent exacerbation, unfortunately failed outpatient therapy and required hospital admission.  Seems to have been related to viral etiology, possibly seasonal allergies contributing. ?He is off steroids now, continues on triple therapy with Trelegy.  He has albuterol as needed for rescue. ?We discussed signs and symptoms of COPD exacerbation and COPD action plan.  He would benefit from pulmonary rehab but he stays very active and prefers to do this on his own ?

## 2022-02-10 NOTE — Progress Notes (Signed)
? ?  Subjective:  ? ? Patient ID: Arthur Quince., male    DOB: 1940/10/16, 81 y.o.   MRN: [id number] ? ?HPI ? ?80 yo smoker , followed for COPD and stable pulmonary nodules ? ?PMH -HFrEF (EF 35 % ) , has recovered ?He races modified cars as a hobby ? ?12/27/2021-01/02/2022: Hospitalization for AECOPD, failed outpatient treatment. Treated with IV steroids and PO levaquin. Initially required O2 therapy but was weaned.  He failed outpatient therapy prior to admission ? ?Chief Complaint  ?Patient presents with  ? Follow-up  ?  * Wants to go back on trelegy since he has some left over ?* Wasn't able to get neb meds due to insurance issues  ? ?Posthospital visit he is much improved about 75% but not fully back to baseline yet.  He was given Judithann Sauger on last office visit with APP but he would prefer to go back to Trelegy since he still has some leftover.  He received a nebulizer but never received albuterol nebs due to insurance issues he does not feel like he needs them now ? ?Significant tests/ events reviewed ? ?2D Echo 05/2016 Moderate to severe global reduction in LV function; mild LVH;   grade 1 diastolic dysfunction with elevated LV filling pressure; ?  mild biatrial enlargement; trace AI, MR and TR. EF 30-35% ?  ?3/2022EF improved to 52%  ?  ?Spirometry 2016 >FEV1 66% , ratio 63.  ?  ?CT chest wo con 01/2022 TAA aneurysm 3.7 cm, RLL nodularity c/w acute bronchitis ?CT chest wo con 06/2021 >> stable RLL nodule ?CT angiogram 12/2019 shows stable right lower lobe 6 mm nodule ? ?CT chest 09/2015-nodule stable or decreased compared to 02/2015 ? ?Review of Systems ?neg for any significant sore throat, dysphagia, itching, sneezing, nasal congestion or excess/ purulent secretions, fever, chills, sweats, unintended wt loss, pleuritic or exertional cp, hempoptysis, orthopnea pnd or change in chronic leg swelling. Also denies presyncope, palpitations, heartburn, abdominal pain, nausea, vomiting, diarrhea or change in bowel or  urinary habits, dysuria,hematuria, rash, arthralgias, visual complaints, headache, numbness weakness or ataxia. ? ?   ?Objective:  ? Physical Exam ? ? ?Gen. Pleasant, elderly, obese, in no distress ?ENT - no lesions, no post nasal drip ?Neck: No JVD, no thyromegaly, no carotid bruits ?Lungs: no use of accessory muscles, no dullness to percussion, decreased without rales or rhonchi  ?Cardiovascular: Rhythm regular, heart sounds  normal, no murmurs or gallops, no peripheral edema ?Musculoskeletal: No deformities, no cyanosis or clubbing , no tremors ? ? ? ? ?   ?Assessment & Plan:  ? ? ?
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
## 2022-02-23 DIAGNOSIS — J432 Centrilobular emphysema: Secondary | ICD-10-CM | POA: Diagnosis not present

## 2022-03-02 ENCOUNTER — Ambulatory Visit: Payer: PPO | Admitting: Cardiovascular Disease

## 2022-03-02 ENCOUNTER — Encounter: Payer: Self-pay | Admitting: Cardiovascular Disease

## 2022-03-02 VITALS — BP 140/70 | HR 65 | Ht 72.0 in | Wt 253.8 lb

## 2022-03-02 DIAGNOSIS — E782 Mixed hyperlipidemia: Secondary | ICD-10-CM | POA: Diagnosis not present

## 2022-03-02 DIAGNOSIS — J449 Chronic obstructive pulmonary disease, unspecified: Secondary | ICD-10-CM | POA: Diagnosis not present

## 2022-03-02 DIAGNOSIS — R079 Chest pain, unspecified: Secondary | ICD-10-CM | POA: Diagnosis not present

## 2022-03-02 DIAGNOSIS — I428 Other cardiomyopathies: Secondary | ICD-10-CM | POA: Diagnosis not present

## 2022-03-02 DIAGNOSIS — I10 Essential (primary) hypertension: Secondary | ICD-10-CM | POA: Diagnosis not present

## 2022-03-02 DIAGNOSIS — I420 Dilated cardiomyopathy: Secondary | ICD-10-CM

## 2022-03-02 LAB — CBC WITH DIFFERENTIAL/PLATELET
Basophils Absolute: 0 10*3/uL (ref 0.0–0.2)
Basos: 0 %
EOS (ABSOLUTE): 0.4 10*3/uL (ref 0.0–0.4)
Eos: 5 %
Hematocrit: 38.4 % (ref 37.5–51.0)
Hemoglobin: 12.4 g/dL — ABNORMAL LOW (ref 13.0–17.7)
Lymphocytes Absolute: 1.9 10*3/uL (ref 0.7–3.1)
Lymphs: 27 %
MCH: 29 pg (ref 26.6–33.0)
MCHC: 32.3 g/dL (ref 31.5–35.7)
MCV: 90 fL (ref 79–97)
Monocytes Absolute: 0.8 10*3/uL (ref 0.1–0.9)
Monocytes: 12 %
Neutrophils Absolute: 3.9 10*3/uL (ref 1.4–7.0)
Neutrophils: 56 %
Platelets: 195 10*3/uL (ref 150–450)
RBC: 4.27 x10E6/uL (ref 4.14–5.80)
RDW: 14.7 % (ref 11.6–15.4)
WBC: 7.1 10*3/uL (ref 3.4–10.8)

## 2022-03-02 LAB — BASIC METABOLIC PANEL
BUN/Creatinine Ratio: 21 (ref 10–24)
BUN: 24 mg/dL (ref 8–27)
CO2: 31 mmol/L — ABNORMAL HIGH (ref 20–29)
Calcium: 9.9 mg/dL (ref 8.6–10.2)
Chloride: 105 mmol/L (ref 96–106)
Creatinine, Ser: 1.16 mg/dL (ref 0.76–1.27)
Glucose: 98 mg/dL (ref 70–99)
Potassium: 4.4 mmol/L (ref 3.5–5.2)
Sodium: 143 mmol/L (ref 134–144)
eGFR: 64 mL/min/{1.73_m2} (ref >59)

## 2022-03-02 NOTE — Patient Instructions (Signed)
Medication Instructions:  Your physician recommends that you continue on your current medications as directed. Please refer to the Current Medication list given to you today.  *If you need a refill on your cardiac medications before your next appointment, please call your pharmacy*  Lab Work: Your physician recommends that you have lab work today.   If you have labs (blood work) drawn today and your tests are completely normal, you will receive your results only by: [place] (if you have MyChart) OR A paper copy in the mail If you have any lab test that is abnormal or we need to change your treatment, we will call you to review the results.  Testing/Procedures: None ordered today.  Follow-Up: At [facility], you and your health needs are our priority.  As part of our continuing mission to provide you with exceptional heart care, we have created designated Provider Care Teams.  These Care Teams include your primary Cardiologist (physician) and Advanced Practice Providers (APPs -  Physician Assistants and Nurse Practitioners) who all work together to provide you with the care you need, when you need it.  We recommend signing up for the patient portal called "MyChart".  Sign up information is provided on this After Visit Summary.  MyChart is used to connect with patients for Virtual Visits (Telemedicine).  Patients are able to view lab/test results, encounter notes, upcoming appointments, etc.  Non-urgent messages can be sent to your provider as well.   To learn more about what you can do with MyChart, go to [email or web address].    Your next appointment:   3 weeks  The format for your next appointment:   In Person  Provider:   PA or NP     [place] OFFICE [place], [place] [address] Dept: [phone] Loc: [place].  03/02/2022  You are  scheduled for a Cardiac Catheterization on Monday, June 5 with Dr. Harrell Gave End.  1. Please arrive at the Main Entrance A at [facility]: [place], [place] [address] at 7:00 AM (This time is two hours before your procedure to ensure your preparation). Free valet parking service is available.   Special note: Every effort is made to have your procedure done on time. Please understand that emergencies sometimes delay scheduled procedures.  2. Diet: Do not eat solid foods after midnight.  You may have clear liquids until 5 AM upon the day of the procedure.  3. Labs: You will need to have blood drawn on Wednesday, May 31 at [facility]. [address]  Open: 7:30am - 5pm    Phone: [phone]. You do not need to be fasting.  4. Medication instructions in preparation for your procedure:   Contrast Allergy: No  Stop taking, Entresto Monday, June 5,  On the morning of your procedure, take Aspirin and any morning medicines NOT listed above.  You may use sips of water.  5. Plan to go home the same day, you will only stay overnight if medically necessary. 6. You MUST have a responsible adult to drive you home. 7. An adult MUST be with you the first 24 hours after you arrive home. 8. Bring a current list of your medications, and the last time and date medication taken. 9. Bring ID and current insurance cards. 10.Please wear clothes that are easy to get on and off  and wear slip-on shoes.  Thank you for allowing [place] to care for you!   -- [facility]  Important Information About [place]

## 2022-03-03 ENCOUNTER — Other Ambulatory Visit: Payer: Self-pay | Admitting: Cardiovascular Disease

## 2022-03-03 ENCOUNTER — Other Ambulatory Visit: Payer: Self-pay | Admitting: Pulmonary Disease

## 2022-03-03 DIAGNOSIS — R079 Chest pain, unspecified: Secondary | ICD-10-CM

## 2022-03-03 MED ORDER — SODIUM CHLORIDE 0.9% FLUSH: 3.0000 mL | Freq: Two times a day (BID) | INTRAVENOUS | Status: DC

## 2022-03-04 ENCOUNTER — Other Ambulatory Visit: Payer: Self-pay | Admitting: Pulmonary Disease

## 2022-03-07 ENCOUNTER — Other Ambulatory Visit: Payer: Self-pay

## 2022-03-07 ENCOUNTER — Other Ambulatory Visit: Payer: Self-pay | Admitting: Nurse Practitioner

## 2022-03-07 ENCOUNTER — Ambulatory Visit (HOSPITAL_COMMUNITY)
Admission: RE | Admit: 2022-03-07 | Discharge: 2022-03-07 | Disposition: A | Discharge status: Home / Self Care | Payer: PPO | Attending: Internal Medicine | Admitting: Internal Medicine

## 2022-03-07 ENCOUNTER — Encounter (HOSPITAL_COMMUNITY)
Admission: RE | Disposition: A | Discharge status: Home / Self Care | Payer: Self-pay | Source: Home / Self Care | Attending: Internal Medicine

## 2022-03-07 DIAGNOSIS — J449 Chronic obstructive pulmonary disease, unspecified: Secondary | ICD-10-CM | POA: Diagnosis not present

## 2022-03-07 DIAGNOSIS — E538 Deficiency of other specified B group vitamins: Secondary | ICD-10-CM

## 2022-03-07 DIAGNOSIS — I2510 Atherosclerotic heart disease of native coronary artery without angina pectoris: Secondary | ICD-10-CM | POA: Insufficient documentation

## 2022-03-07 DIAGNOSIS — Z87891 Personal history of nicotine dependence: Secondary | ICD-10-CM | POA: Diagnosis not present

## 2022-03-07 DIAGNOSIS — I447 Left bundle-branch block, unspecified: Secondary | ICD-10-CM | POA: Insufficient documentation

## 2022-03-07 DIAGNOSIS — E039 Hypothyroidism, unspecified: Secondary | ICD-10-CM | POA: Insufficient documentation

## 2022-03-07 DIAGNOSIS — R739 Hyperglycemia, unspecified: Secondary | ICD-10-CM

## 2022-03-07 DIAGNOSIS — R079 Chest pain, unspecified: Secondary | ICD-10-CM | POA: Diagnosis not present

## 2022-03-07 DIAGNOSIS — I77819 Aortic ectasia, unspecified site: Secondary | ICD-10-CM | POA: Insufficient documentation

## 2022-03-07 DIAGNOSIS — I428 Other cardiomyopathies: Secondary | ICD-10-CM | POA: Diagnosis not present

## 2022-03-07 DIAGNOSIS — Z79899 Other long term (current) drug therapy: Secondary | ICD-10-CM | POA: Diagnosis not present

## 2022-03-07 DIAGNOSIS — E785 Hyperlipidemia, unspecified: Secondary | ICD-10-CM | POA: Diagnosis not present

## 2022-03-07 DIAGNOSIS — Z79890 Hormone replacement therapy (postmenopausal): Secondary | ICD-10-CM | POA: Insufficient documentation

## 2022-03-07 HISTORY — PX: LEFT HEART CATH AND CORONARY ANGIOGRAPHY: CATH118249

## 2022-03-07 SURGERY — LEFT HEART CATH AND CORONARY ANGIOGRAPHY
Anesthesia: LOCAL

## 2022-03-07 MED ORDER — MIDAZOLAM HCL 2 MG/2ML IJ SOLN
INTRAMUSCULAR | Status: AC
  Filled 2022-03-07: qty 2

## 2022-03-07 MED ORDER — LIDOCAINE HCL (PF) 1 % IJ SOLN
INTRAMUSCULAR | Status: AC
  Filled 2022-03-07: qty 30

## 2022-03-07 MED ORDER — VERAPAMIL HCL 2.5 MG/ML IV SOLN
INTRAVENOUS | Status: DC | PRN
  Administered 2022-03-07: 10 mL via INTRA_ARTERIAL

## 2022-03-07 MED ORDER — SODIUM CHLORIDE 0.9 % IV SOLN: 250.0000 mL | INTRAVENOUS | Status: DC | PRN

## 2022-03-07 MED ORDER — SODIUM CHLORIDE 0.9 % WEIGHT BASED INFUSION
3.0000 mL/kg/h | INTRAVENOUS | Status: AC
  Administered 2022-03-07: 3 mL/kg/h via INTRAVENOUS

## 2022-03-07 MED ORDER — HYDRALAZINE HCL 20 MG/ML IJ SOLN: 10.0000 mg | INTRAMUSCULAR | Status: DC | PRN

## 2022-03-07 MED ORDER — ONDANSETRON HCL 4 MG/2ML IJ SOLN: 4.0000 mg | Freq: Four times a day (QID) | INTRAMUSCULAR | Status: DC | PRN

## 2022-03-07 MED ORDER — ACETAMINOPHEN 325 MG PO TABS: 650.0000 mg | ORAL_TABLET | ORAL | Status: DC | PRN

## 2022-03-07 MED ORDER — HEPARIN (PORCINE) IN NACL 1000-0.9 UT/500ML-% IV SOLN
INTRAVENOUS | Status: DC | PRN
  Administered 2022-03-07 (×2): 500 mL

## 2022-03-07 MED ORDER — HEPARIN SODIUM (PORCINE) 1000 UNIT/ML IJ SOLN
INTRAMUSCULAR | Status: AC
  Filled 2022-03-07: qty 10

## 2022-03-07 MED ORDER — SODIUM CHLORIDE 0.9% FLUSH: 3.0000 mL | INTRAVENOUS | Status: DC | PRN

## 2022-03-07 MED ORDER — IOHEXOL 350 MG/ML SOLN
INTRAVENOUS | Status: DC | PRN
  Administered 2022-03-07: 65 mL via INTRA_ARTERIAL

## 2022-03-07 MED ORDER — LABETALOL HCL 5 MG/ML IV SOLN: 10.0000 mg | INTRAVENOUS | Status: DC | PRN

## 2022-03-07 MED ORDER — MIDAZOLAM HCL 2 MG/2ML IJ SOLN
INTRAMUSCULAR | Status: DC | PRN
  Administered 2022-03-07: 1 mg via INTRAVENOUS

## 2022-03-07 MED ORDER — LIDOCAINE HCL (PF) 1 % IJ SOLN
INTRAMUSCULAR | Status: DC | PRN
  Administered 2022-03-07: 2 mL via INTRADERMAL

## 2022-03-07 MED ORDER — SODIUM CHLORIDE 0.9% FLUSH: 3.0000 mL | Freq: Two times a day (BID) | INTRAVENOUS | Status: DC

## 2022-03-07 MED ORDER — SODIUM CHLORIDE 0.9 % WEIGHT BASED INFUSION: 1.0000 mL/kg/h | INTRAVENOUS | Status: DC

## 2022-03-07 MED ORDER — FENTANYL CITRATE (PF) 100 MCG/2ML IJ SOLN
INTRAMUSCULAR | Status: AC
  Filled 2022-03-07: qty 2

## 2022-03-07 MED ORDER — FENTANYL CITRATE (PF) 100 MCG/2ML IJ SOLN
INTRAMUSCULAR | Status: DC | PRN
  Administered 2022-03-07: 25 ug via INTRAVENOUS

## 2022-03-07 MED ORDER — HEPARIN SODIUM (PORCINE) 1000 UNIT/ML IJ SOLN
INTRAMUSCULAR | Status: DC | PRN
  Administered 2022-03-07: 5000 [IU] via INTRAVENOUS

## 2022-03-07 MED ORDER — VERAPAMIL HCL 2.5 MG/ML IV SOLN
INTRAVENOUS | Status: AC
  Filled 2022-03-07: qty 2

## 2022-03-07 MED ORDER — HEPARIN (PORCINE) IN NACL 1000-0.9 UT/500ML-% IV SOLN
INTRAVENOUS | Status: AC
  Filled 2022-03-07: qty 1000

## 2022-03-07 MED ORDER — ASPIRIN 81 MG PO CHEW: 81.0000 mg | CHEWABLE_TABLET | ORAL | Status: DC

## 2022-03-07 SURGICAL SUPPLY — 10 items
BAND ZEPHYR COMPRESS 30 LONG (HEMOSTASIS) ×1 IMPLANT
CATH 5FR JL3.5 JR4 ANG PIG MP (CATHETERS) ×1 IMPLANT
GLIDESHEATH SLEND SS 6F .021 (SHEATH) ×1 IMPLANT
GUIDEWIRE INQWIRE 1.5J.035X260 (WIRE) IMPLANT
INQWIRE 1.5J .035X260CM (WIRE) ×2
KIT HEART LEFT (KITS) ×2 IMPLANT
PACK CARDIAC CATHETERIZATION (CUSTOM PROCEDURE TRAY) ×2 IMPLANT
SYR MEDRAD MARK 7 150ML (SYRINGE) ×2 IMPLANT
TRANSDUCER W/STOPCOCK (MISCELLANEOUS) ×2 IMPLANT
TUBING CIL FLEX 10 FLL-RA (TUBING) ×2 IMPLANT

## 2022-03-07 NOTE — Interval H&P Note (Signed)
History and Physical Interval Note:  03/07/2022 9:18 AM  Arthur [name].  has presented today for surgery, with the diagnosis of chest pain.  The various methods of treatment have been discussed with the patient and family. After consideration of risks, benefits and other options for treatment, the patient has consented to  Procedure(s): LEFT HEART CATH AND CORONARY ANGIOGRAPHY (N/A) as a surgical intervention.  The patient's history has been reviewed, patient examined, no change in status, stable for surgery.  I have reviewed the patient's chart and labs.  Questions were answered to the patient's satisfaction.    Cath Lab Visit (complete for each Cath Lab visit)  Clinical Evaluation Leading to the Procedure:   ACS: No.  Non-ACS:    Anginal Classification: CCS IV  Anti-ischemic medical therapy: Minimal Therapy (1 class of medications)  Non-Invasive Test Results: No non-invasive testing performed  Prior CABG: No previous CABG  [name]

## 2022-03-07 NOTE — Brief Op Note (Signed)
BRIEF CARDIAC CATHETERIZATION NOTE  DATE: 03/07/2022  TIME: 10:19 AM  PATIENT:  Arthur [name].  81 y.o. male  PRE-OPERATIVE DIAGNOSIS:  Chest pain  POST-OPERATIVE DIAGNOSIS:  Nonobstructive coronary artery disease  PROCEDURE:  Procedure(s): LEFT HEART CATH AND CORONARY ANGIOGRAPHY (N/A)  SURGEON:  Surgeon(s) and Role:    Nelva Bush, MD - Primary  FINDINGS: Nonobstructive CAD. Mildly elevated LVEDP.  RECOMMENDATIONS: Medical therapy.  Consider gentle diuresis.  Nelva Bush, MD [facility]

## 2022-03-07 NOTE — Progress Notes (Signed)
Orders only

## 2022-03-08 ENCOUNTER — Encounter (HOSPITAL_COMMUNITY): Payer: Self-pay | Admitting: Internal Medicine

## 2022-03-22 ENCOUNTER — Other Ambulatory Visit: Payer: Self-pay | Admitting: *Deleted

## 2022-03-22 ENCOUNTER — Ambulatory Visit: Payer: PPO | Admitting: Physician Assistant

## 2022-03-22 ENCOUNTER — Other Ambulatory Visit (INDEPENDENT_AMBULATORY_CARE_PROVIDER_SITE_OTHER): Payer: PPO

## 2022-03-22 DIAGNOSIS — E538 Deficiency of other specified B group vitamins: Secondary | ICD-10-CM

## 2022-03-22 DIAGNOSIS — R7989 Other specified abnormal findings of blood chemistry: Secondary | ICD-10-CM | POA: Diagnosis not present

## 2022-03-22 DIAGNOSIS — R739 Hyperglycemia, unspecified: Secondary | ICD-10-CM

## 2022-03-22 LAB — HEMOGLOBIN A1C: Hgb A1c MFr Bld: 6.2 % (ref 4.6–6.5)

## 2022-03-22 LAB — TSH: TSH: 0.75 u[IU]/mL (ref 0.35–5.50)

## 2022-03-22 LAB — T4, FREE: Free T4: 1.5 ng/dL (ref 0.60–1.60)

## 2022-03-22 LAB — VITAMIN B12: Vitamin B-12: 1004 pg/mL — ABNORMAL HIGH (ref 211–911)

## 2022-03-25 DIAGNOSIS — J449 Chronic obstructive pulmonary disease, unspecified: Secondary | ICD-10-CM | POA: Diagnosis not present

## 2022-03-25 DIAGNOSIS — I5020 Unspecified systolic (congestive) heart failure: Secondary | ICD-10-CM | POA: Diagnosis not present

## 2022-03-25 DIAGNOSIS — Z6835 Body mass index (BMI) 35.0-35.9, adult: Secondary | ICD-10-CM | POA: Diagnosis not present

## 2022-03-25 DIAGNOSIS — D692 Other nonthrombocytopenic purpura: Secondary | ICD-10-CM | POA: Diagnosis not present

## 2022-03-25 DIAGNOSIS — Z7951 Long term (current) use of inhaled steroids: Secondary | ICD-10-CM | POA: Diagnosis not present

## 2022-03-25 DIAGNOSIS — I110 Hypertensive heart disease with heart failure: Secondary | ICD-10-CM | POA: Diagnosis not present

## 2022-03-26 DIAGNOSIS — J432 Centrilobular emphysema: Secondary | ICD-10-CM | POA: Diagnosis not present
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
## 2022-04-11 ENCOUNTER — Ambulatory Visit: Payer: PPO | Admitting: Physician Assistant

## 2022-04-11 ENCOUNTER — Encounter: Payer: Self-pay | Admitting: Physician Assistant

## 2022-04-11 VITALS — BP 130/76 | HR 84 | Ht 72.0 in | Wt 257.6 lb

## 2022-04-11 DIAGNOSIS — I428 Other cardiomyopathies: Secondary | ICD-10-CM | POA: Diagnosis not present

## 2022-04-11 DIAGNOSIS — E785 Hyperlipidemia, unspecified: Secondary | ICD-10-CM | POA: Diagnosis not present

## 2022-04-11 DIAGNOSIS — E039 Hypothyroidism, unspecified: Secondary | ICD-10-CM

## 2022-04-11 DIAGNOSIS — R55 Syncope and collapse: Secondary | ICD-10-CM

## 2022-04-11 DIAGNOSIS — I420 Dilated cardiomyopathy: Secondary | ICD-10-CM | POA: Diagnosis not present

## 2022-04-11 DIAGNOSIS — R079 Chest pain, unspecified: Secondary | ICD-10-CM

## 2022-04-11 DIAGNOSIS — I10 Essential (primary) hypertension: Secondary | ICD-10-CM | POA: Diagnosis not present

## 2022-04-11 MED ORDER — ENTRESTO 49-51 MG PO TABS: 1.0000 | ORAL_TABLET | Freq: Two times a day (BID) | ORAL | 11 refills | Status: DC

## 2022-04-11 NOTE — Patient Instructions (Addendum)
Medication Instructions:  Your physician recommends that you continue on your current medications as directed. Please refer to the Current Medication list given to you today.  *If you need a refill on your cardiac medications before your next appointment, please call your pharmacy*   Lab Work: Fasting lipids, Friday 04/15/22, you can come anytime between 7:15 AM and 4:14 PM If you have labs (blood work) drawn today and your tests are completely normal, you will receive your results only by: [place] (if you have MyChart) OR A paper copy in the mail If you have any lab test that is abnormal or we need to change your treatment, we will call you to review the results.  Follow-Up: At [facility], you and your health needs are our priority.  As part of our continuing mission to provide you with exceptional heart care, we have created designated Provider Care Teams.  These Care Teams include your primary Cardiologist (physician) and Advanced Practice Providers (APPs -  Physician Assistants and Nurse Practitioners) who all work together to provide you with the care you need, when you need it.  We recommend signing up for the patient portal called "MyChart".  Sign up information is provided on this After Visit Summary.  MyChart is used to connect with patients for Virtual Visits (Telemedicine).  Patients are able to view lab/test results, encounter notes, upcoming appointments, etc.  Non-urgent messages can be sent to your provider as well.   To learn more about what you can do with MyChart, go to [email or web address].    Your next appointment:   6 month(s)  The format for your next appointment:   In Person  Provider:   Jenkins Rouge, MD  or Nicholes Rough, PA-C       Important Information About Sugar

## 2022-04-15 ENCOUNTER — Other Ambulatory Visit: Payer: PPO

## 2022-04-15 DIAGNOSIS — E785 Hyperlipidemia, unspecified: Secondary | ICD-10-CM

## 2022-04-15 LAB — LIPID PANEL
Chol/HDL Ratio: 3.3 ratio (ref 0.0–5.0)
Cholesterol, Total: 104 mg/dL (ref 100–199)
HDL: 32 mg/dL — ABNORMAL LOW (ref >39)
LDL Chol Calc (NIH): 55 mg/dL (ref 0–99)
Triglycerides: 82 mg/dL (ref 0–149)
VLDL Cholesterol Cal: 17 mg/dL (ref 5–40)

## 2022-04-20 ENCOUNTER — Other Ambulatory Visit: Payer: Self-pay | Admitting: Cardiovascular Disease

## 2022-04-20 MED ORDER — ENTRESTO 49-51 MG PO TABS: 1.0000 | ORAL_TABLET | Freq: Two times a day (BID) | ORAL | 3 refills | Status: DC

## 2022-04-25 DIAGNOSIS — J432 Centrilobular emphysema: Secondary | ICD-10-CM | POA: Diagnosis not present

## 2022-05-04 ENCOUNTER — Other Ambulatory Visit: Payer: Self-pay | Admitting: Internal Medicine

## 2022-05-04 ENCOUNTER — Other Ambulatory Visit: Payer: Self-pay | Admitting: Nurse Practitioner

## 2022-05-09 ENCOUNTER — Ambulatory Visit (INDEPENDENT_AMBULATORY_CARE_PROVIDER_SITE_OTHER): Payer: PPO | Admitting: Nurse Practitioner

## 2022-05-09 ENCOUNTER — Other Ambulatory Visit: Payer: Self-pay | Admitting: Nurse Practitioner

## 2022-05-09 VITALS — BP 100/50 | HR 70 | Temp 97.7°F | Resp 16 | Ht 72.0 in | Wt 256.0 lb

## 2022-05-09 DIAGNOSIS — R5383 Other fatigue: Secondary | ICD-10-CM

## 2022-05-09 DIAGNOSIS — R21 Rash and other nonspecific skin eruption: Secondary | ICD-10-CM | POA: Insufficient documentation

## 2022-05-09 DIAGNOSIS — R0989 Other specified symptoms and signs involving the circulatory and respiratory systems: Secondary | ICD-10-CM

## 2022-05-09 MED ORDER — FLUTICASONE PROPIONATE 50 MCG/ACT NA SUSP: 2.0000 | Freq: Every day | NASAL | 0 refills | Status: DC

## 2022-05-09 MED ORDER — CETIRIZINE HCL 10 MG PO TABS: 10.0000 mg | ORAL_TABLET | Freq: Every day | ORAL | 0 refills | Status: DC

## 2022-05-09 NOTE — Assessment & Plan Note (Signed)
Ambiguous in nature.  Does not look like shingles currently.  States it waxes and wanes we will start patient on Zyrtec 10 mg nightly.  Did discuss with patient, possible side effects of dizziness with him getting up going to the bathroom at night.  Acknowledged

## 2022-05-09 NOTE — Progress Notes (Signed)
Established Patient Office Visit  Subjective   Patient ID: Arthur Barkdull., male    DOB: 1941-04-26  Age: 81 y.o. MRN: [id number]  Chief Complaint  Patient presents with   Hoarse    Starts loosing his voice the more he talks, feels like something is in his throat. No pain more of an aggravation. Started a week ago.   Rash    X 2 weeks, sometimes itching and burning sensation present.       Throat: States that it started last week and gotten worse. States that it is getting worse.  States he can swallow but does not feel like it can not movement.  Globus sensation. No thyroid. He has had a total thyroidectomy.  Currently on PPI. States that he does have PND but not often  Rash: sometimes it itches, burns or stings. Worse with touch. No OTC creams has been there for couple weeks.  Fatigue: 129/72 this morning currently maintained on carvedilol and Entresto.  Blood pressure borderline in office today.  He is not dizzy or lightheaded but states he just feels fatigued.  He does check his blood pressure at home at least daily.  He has had a modest weight gain but then reduction since last office visit.  Recent left heart cath that was negative   Review of Systems  Constitutional:  Negative for chills and fever.  HENT:  Negative for congestion, ear discharge, ear pain, sinus pain and sore throat.   Respiratory:  Negative for shortness of breath.   Cardiovascular:  Positive for chest pain.  Skin:  Positive for rash.  Neurological:  Positive for dizziness (with coughing or sneexing).      Objective:     BP (!) 100/50   Pulse 70   Temp 97.7 F (36.5 C)   Resp 16   Ht 6' (1.829 m)   Wt 256 lb (116.1 kg)   SpO2 94%   BMI 34.72 kg/m  BP Readings from Last 3 Encounters:  05/09/22 (!) 100/50  04/11/22 130/76  03/07/22 (!) 165/76   Wt Readings from Last 3 Encounters:  05/09/22 256 lb (116.1 kg)  04/11/22 257 lb 9.6 oz (116.8 kg)  03/07/22 250 lb (113.4 kg)       Physical Exam Vitals and nursing note reviewed.  Constitutional:      Appearance: Normal appearance. He is obese.  HENT:     Right Ear: Tympanic membrane, ear canal and external ear normal.     Left Ear: Tympanic membrane, ear canal and external ear normal.     Mouth/Throat:     Mouth: Mucous membranes are moist.     Pharynx: Oropharynx is clear.  Cardiovascular:     Rate and Rhythm: Normal rate and regular rhythm.  Pulmonary:     Effort: Pulmonary effort is normal.     Comments: Decreased globally  Musculoskeletal:     Right lower leg: Edema present.     Left lower leg: Edema present.     Comments: Trace edema left 1+ edema on the right  Lymphadenopathy:     Cervical: No cervical adenopathy.  Skin:    General: Skin is warm.  Neurological:     Mental Status: He is alert.      No results found for any visits on 05/09/22.    The ASCVD Risk score (Arnett DK, et al., 2019) failed to calculate for the following reasons:   The valid total cholesterol range is 130 to 320 mg/dL  Assessment & Plan:   Problem List Items Addressed This Visit       Musculoskeletal and Integument   Rash    Ambiguous in nature.  Does not look like shingles currently.  States it waxes and wanes we will start patient on Zyrtec 10 mg nightly.  Did discuss with patient, possible side effects of dizziness with him getting up going to the bathroom at night.  Acknowledged      Relevant Medications   cetirizine (ZYRTEC) 10 MG tablet     Other   Fatigue    Ambiguous in nature last blood work within normal limits.  Patient recently had heart cath that was negative.  Patient does have COPD but does not endorse any more shortness of breath than normal nor any chest pain..  Continue to monitor      Globus sensation - Primary   Relevant Medications   fluticasone (FLONASE) 50 MCG/ACT nasal spray   cetirizine (ZYRTEC) 10 MG tablet    Return for As scheduled.    Romilda Garret, NP

## 2022-05-09 NOTE — Assessment & Plan Note (Signed)
Ambiguous in nature last blood work within normal limits.  Patient recently had heart cath that was negative.  Patient does have COPD but does not endorse any more shortness of breath than normal nor any chest pain..  Continue to monitor

## 2022-05-09 NOTE — Patient Instructions (Addendum)
Nice to see you today I have sent in a pill and nose spray to try. If you are not improving in a week let me know by calling the office  Keep your appointment with me in September for your usual recheck

## 2022-05-26 DIAGNOSIS — J432 Centrilobular emphysema: Secondary | ICD-10-CM | POA: Diagnosis not present

## 2022-06-01 ENCOUNTER — Ambulatory Visit (INDEPENDENT_AMBULATORY_CARE_PROVIDER_SITE_OTHER): Payer: PPO | Admitting: Primary Care

## 2022-06-01 ENCOUNTER — Encounter: Payer: Self-pay | Admitting: Primary Care

## 2022-06-01 ENCOUNTER — Ambulatory Visit (INDEPENDENT_AMBULATORY_CARE_PROVIDER_SITE_OTHER)
Admission: RE | Admit: 2022-06-01 | Discharge: 2022-06-01 | Disposition: A | Discharge status: Home / Self Care | Payer: PPO | Source: Ambulatory Visit | Attending: Primary Care | Admitting: Primary Care

## 2022-06-01 VITALS — BP 124/68 | HR 73 | Temp 97.7°F | Ht 72.0 in | Wt 257.2 lb

## 2022-06-01 DIAGNOSIS — M79661 Pain in right lower leg: Secondary | ICD-10-CM | POA: Diagnosis not present

## 2022-06-01 DIAGNOSIS — M79604 Pain in right leg: Secondary | ICD-10-CM

## 2022-06-01 NOTE — Patient Instructions (Signed)
Complete xray(s) prior to leaving today.   Continue Aleve as needed for pain. I will be in touch soon.  It was a pleasure meeting you!
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
## 2022-06-06 ENCOUNTER — Other Ambulatory Visit: Payer: Self-pay | Admitting: Nurse Practitioner

## 2022-06-06 DIAGNOSIS — R0989 Other specified symptoms and signs involving the circulatory and respiratory systems: Secondary | ICD-10-CM

## 2022-06-07 ENCOUNTER — Other Ambulatory Visit: Payer: Self-pay | Admitting: Pulmonary Disease

## 2022-06-16 ENCOUNTER — Encounter: Payer: Self-pay | Admitting: Adult Health

## 2022-06-16 ENCOUNTER — Ambulatory Visit: Payer: PPO | Admitting: Adult Health

## 2022-06-16 DIAGNOSIS — I5042 Chronic combined systolic (congestive) and diastolic (congestive) heart failure: Secondary | ICD-10-CM | POA: Diagnosis not present

## 2022-06-16 DIAGNOSIS — R918 Other nonspecific abnormal finding of lung field: Secondary | ICD-10-CM

## 2022-06-16 DIAGNOSIS — H04123 Dry eye syndrome of bilateral lacrimal glands: Secondary | ICD-10-CM | POA: Diagnosis not present

## 2022-06-16 DIAGNOSIS — Z23 Encounter for immunization: Secondary | ICD-10-CM | POA: Diagnosis not present

## 2022-06-16 DIAGNOSIS — Z961 Presence of intraocular lens: Secondary | ICD-10-CM | POA: Diagnosis not present

## 2022-06-16 DIAGNOSIS — J432 Centrilobular emphysema: Secondary | ICD-10-CM | POA: Diagnosis not present

## 2022-06-16 NOTE — Assessment & Plan Note (Addendum)
Patient has had history of pulmonary nodularity.  CT chest and April 2023 did show a 1 cm right upper lobe nodule.  We will repeat CT chest for stability.

## 2022-06-16 NOTE — Assessment & Plan Note (Signed)
Appears euvolemic on exam.  Continue follow-up with cardiology.  Continue current regimen
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
## 2022-06-16 NOTE — Patient Instructions (Signed)
Set up for CT chest -lung nodule  Continue on Trelegy 1 puff daily.  Activity as tolerated.  Work on healthy weight loss.  Flu shot today .  Follow up Dr. Elsworth Soho  In 6 months and As needed   Please contact office for sooner follow up if symptoms do not improve or worsen or seek emergency care

## 2022-06-16 NOTE — Assessment & Plan Note (Signed)
Compensated on present regimen.  Flu shot today.  Plan  Patient Instructions  Set up for CT chest -lung nodule  Continue on Trelegy 1 puff daily.  Activity as tolerated.  Work on healthy weight loss.  Flu shot today .  Follow up Dr. Elsworth Soho  In 6 months and As needed   Please contact office for sooner follow up if symptoms do not improve or worsen or seek emergency care

## 2022-06-17 DIAGNOSIS — I110 Hypertensive heart disease with heart failure: Secondary | ICD-10-CM | POA: Diagnosis not present

## 2022-06-17 DIAGNOSIS — F330 Major depressive disorder, recurrent, mild: Secondary | ICD-10-CM | POA: Diagnosis not present

## 2022-06-17 DIAGNOSIS — I5020 Unspecified systolic (congestive) heart failure: Secondary | ICD-10-CM | POA: Diagnosis not present

## 2022-06-17 DIAGNOSIS — J449 Chronic obstructive pulmonary disease, unspecified: Secondary | ICD-10-CM | POA: Diagnosis not present

## 2022-06-17 DIAGNOSIS — Z6834 Body mass index (BMI) 34.0-34.9, adult: Secondary | ICD-10-CM | POA: Diagnosis not present

## 2022-06-17 DIAGNOSIS — Z7951 Long term (current) use of inhaled steroids: Secondary | ICD-10-CM | POA: Diagnosis not present

## 2022-06-23 ENCOUNTER — Ambulatory Visit: Payer: PPO | Admitting: Nurse Practitioner

## 2022-06-26 DIAGNOSIS — J432 Centrilobular emphysema: Secondary | ICD-10-CM | POA: Diagnosis not present

## 2022-06-27 ENCOUNTER — Ambulatory Visit: Payer: PPO | Admitting: Nurse Practitioner

## 2022-07-04 ENCOUNTER — Ambulatory Visit (INDEPENDENT_AMBULATORY_CARE_PROVIDER_SITE_OTHER): Payer: PPO | Admitting: Nurse Practitioner

## 2022-07-04 VITALS — BP 132/60 | HR 67 | Temp 96.8°F | Resp 18 | Ht 72.0 in | Wt 259.0 lb

## 2022-07-04 DIAGNOSIS — E538 Deficiency of other specified B group vitamins: Secondary | ICD-10-CM

## 2022-07-04 DIAGNOSIS — J432 Centrilobular emphysema: Secondary | ICD-10-CM

## 2022-07-04 DIAGNOSIS — I5030 Unspecified diastolic (congestive) heart failure: Secondary | ICD-10-CM

## 2022-07-04 DIAGNOSIS — M79604 Pain in right leg: Secondary | ICD-10-CM

## 2022-07-04 DIAGNOSIS — I10 Essential (primary) hypertension: Secondary | ICD-10-CM

## 2022-07-04 DIAGNOSIS — I712 Thoracic aortic aneurysm, without rupture, unspecified: Secondary | ICD-10-CM

## 2022-07-04 LAB — CBC
HCT: 36.7 % — ABNORMAL LOW (ref 39.0–52.0)
Hemoglobin: 12.1 g/dL — ABNORMAL LOW (ref 13.0–17.0)
MCHC: 32.9 g/dL (ref 30.0–36.0)
MCV: 88.4 fl (ref 78.0–100.0)
Platelets: 211 10*3/uL (ref 150.0–400.0)
RBC: 4.16 Mil/uL — ABNORMAL LOW (ref 4.22–5.81)
RDW: 13.8 % (ref 11.5–15.5)
WBC: 7 10*3/uL (ref 4.0–10.5)

## 2022-07-04 LAB — BASIC METABOLIC PANEL
BUN: 17 mg/dL (ref 6–23)
CO2: 31 mEq/L (ref 19–32)
Calcium: 9.6 mg/dL (ref 8.4–10.5)
Chloride: 104 mEq/L (ref 96–112)
Creatinine, Ser: 1.15 mg/dL (ref 0.40–1.50)
GFR: 60.34 mL/min (ref >60.00)
Glucose, Bld: 135 mg/dL — ABNORMAL HIGH (ref 70–99)
Potassium: 4.3 mEq/L (ref 3.5–5.1)
Sodium: 142 mEq/L (ref 135–145)

## 2022-07-04 LAB — VITAMIN B12: Vitamin B-12: 541 pg/mL (ref 211–911)

## 2022-07-04 NOTE — Assessment & Plan Note (Signed)
Currently maintained on Entresto, carvedilol.

## 2022-07-04 NOTE — Assessment & Plan Note (Signed)
Will need repeat imaging in April 2024.  Blood pressure within normal limits today

## 2022-07-04 NOTE — Patient Instructions (Signed)
Nice to see you today I will be in touch with the labs once I have them Follow up with me in 6 months for your physical and labs Follow up sooner if you need me

## 2022-07-04 NOTE — Assessment & Plan Note (Signed)
Patient is followed by pulmonology.  Currently on Trelegy

## 2022-07-04 NOTE — Assessment & Plan Note (Signed)
Improving but still fairly tender to the touch.  Offered to reimage today to make sure there was not small acute occult fracture that we saw healing.  Patient politely declined he is ambulatory in office
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
## 2022-07-04 NOTE — Assessment & Plan Note (Signed)
Patient followed by Dr. Jenkins Rouge cardiology and maintained on carvedilol, Entresto and statin medication.

## 2022-07-05 ENCOUNTER — Other Ambulatory Visit (INDEPENDENT_AMBULATORY_CARE_PROVIDER_SITE_OTHER): Payer: PPO

## 2022-07-05 DIAGNOSIS — R7989 Other specified abnormal findings of blood chemistry: Secondary | ICD-10-CM | POA: Diagnosis not present

## 2022-07-05 LAB — IBC PANEL
Iron: 67 ug/dL (ref 42–165)
Saturation Ratios: 19.2 % — ABNORMAL LOW (ref 20.0–50.0)
TIBC: 348.6 ug/dL (ref 250.0–450.0)
Transferrin: 249 mg/dL (ref 212.0–360.0)

## 2022-07-05 LAB — FERRITIN: Ferritin: 14.9 ng/mL — ABNORMAL LOW (ref 22.0–322.0)

## 2022-07-17 DIAGNOSIS — T2113XA Burn of first degree of upper back, initial encounter: Secondary | ICD-10-CM | POA: Diagnosis not present

## 2022-07-26 DIAGNOSIS — J432 Centrilobular emphysema: Secondary | ICD-10-CM | POA: Diagnosis not present

## 2022-07-30 ENCOUNTER — Other Ambulatory Visit: Payer: Self-pay | Admitting: Nurse Practitioner

## 2022-07-30 DIAGNOSIS — E039 Hypothyroidism, unspecified: Secondary | ICD-10-CM

## 2022-08-24 ENCOUNTER — Ambulatory Visit (INDEPENDENT_AMBULATORY_CARE_PROVIDER_SITE_OTHER)
Admission: RE | Admit: 2022-08-24 | Discharge: 2022-08-24 | Disposition: A | Discharge status: Home / Self Care | Payer: PPO | Source: Ambulatory Visit | Attending: Family Medicine | Admitting: Family Medicine

## 2022-08-24 ENCOUNTER — Ambulatory Visit (INDEPENDENT_AMBULATORY_CARE_PROVIDER_SITE_OTHER): Payer: PPO | Admitting: Family Medicine

## 2022-08-24 ENCOUNTER — Encounter: Payer: Self-pay | Admitting: Family Medicine

## 2022-08-24 VITALS — BP 130/80 | HR 65 | Temp 97.6°F | Ht 72.0 in | Wt 258.0 lb

## 2022-08-24 DIAGNOSIS — M5137 Other intervertebral disc degeneration, lumbosacral region: Secondary | ICD-10-CM

## 2022-08-24 DIAGNOSIS — M545 Low back pain, unspecified: Secondary | ICD-10-CM | POA: Diagnosis not present

## 2022-08-24 DIAGNOSIS — M4316 Spondylolisthesis, lumbar region: Secondary | ICD-10-CM | POA: Diagnosis not present

## 2022-08-24 MED ORDER — PREDNISONE 20 MG PO TABS: ORAL_TABLET | ORAL | 0 refills | Status: DC
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
## 2022-08-26 DIAGNOSIS — J432 Centrilobular emphysema: Secondary | ICD-10-CM | POA: Diagnosis not present

## 2022-09-02 ENCOUNTER — Other Ambulatory Visit: Payer: Self-pay | Admitting: Cardiovascular Disease

## 2022-09-02 DIAGNOSIS — I10 Essential (primary) hypertension: Secondary | ICD-10-CM

## 2022-09-25 DIAGNOSIS — J432 Centrilobular emphysema: Secondary | ICD-10-CM | POA: Diagnosis not present

## 2022-10-26 DIAGNOSIS — J432 Centrilobular emphysema: Secondary | ICD-10-CM | POA: Diagnosis not present

## 2022-11-26 DIAGNOSIS — J432 Centrilobular emphysema: Secondary | ICD-10-CM | POA: Diagnosis not present

## 2022-12-06 ENCOUNTER — Other Ambulatory Visit: Payer: Self-pay | Admitting: Cardiovascular Disease

## 2022-12-09 ENCOUNTER — Other Ambulatory Visit: Payer: Self-pay | Admitting: Cardiovascular Disease

## 2022-12-09 DIAGNOSIS — I10 Essential (primary) hypertension: Secondary | ICD-10-CM

## 2022-12-15 ENCOUNTER — Ambulatory Visit (HOSPITAL_BASED_OUTPATIENT_CLINIC_OR_DEPARTMENT_OTHER): Payer: PPO | Admitting: Pulmonary Disease

## 2022-12-15 ENCOUNTER — Encounter (HOSPITAL_BASED_OUTPATIENT_CLINIC_OR_DEPARTMENT_OTHER): Payer: Self-pay | Admitting: Pulmonary Disease

## 2022-12-15 ENCOUNTER — Ambulatory Visit (HOSPITAL_COMMUNITY)
Admission: RE | Admit: 2022-12-15 | Discharge: 2022-12-15 | Disposition: A | Discharge status: Home / Self Care | Payer: PPO | Source: Ambulatory Visit | Attending: Adult Health | Admitting: Adult Health

## 2022-12-15 ENCOUNTER — Ambulatory Visit (INDEPENDENT_AMBULATORY_CARE_PROVIDER_SITE_OTHER): Payer: PPO | Admitting: Pulmonary Disease

## 2022-12-15 VITALS — BP 134/78 | HR 70 | Temp 98.3°F | Ht 72.0 in | Wt 261.0 lb

## 2022-12-15 DIAGNOSIS — J432 Centrilobular emphysema: Secondary | ICD-10-CM

## 2022-12-15 DIAGNOSIS — I5042 Chronic combined systolic (congestive) and diastolic (congestive) heart failure: Secondary | ICD-10-CM | POA: Diagnosis not present

## 2022-12-15 DIAGNOSIS — J439 Emphysema, unspecified: Secondary | ICD-10-CM | POA: Diagnosis not present

## 2022-12-15 DIAGNOSIS — R918 Other nonspecific abnormal finding of lung field: Secondary | ICD-10-CM | POA: Diagnosis not present

## 2022-12-15 MED ORDER — TRELEGY ELLIPTA 100-62.5-25 MCG/ACT IN AEPB: 1.0000 | INHALATION_SPRAY | Freq: Every day | RESPIRATORY_TRACT | 5 refills | Status: DC
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
## 2022-12-15 NOTE — Patient Instructions (Signed)
  Refills on trelegy 

## 2022-12-16 NOTE — Assessment & Plan Note (Signed)
Continue Trelegy Use albuterol as needed. We discussed COPD action plan and signs and symptoms of COPD exacerbation 

## 2022-12-16 NOTE — Assessment & Plan Note (Signed)
These have been stable.  He underwent repeat CT chest without contrast and awaiting results

## 2022-12-23 ENCOUNTER — Other Ambulatory Visit: Payer: Self-pay

## 2022-12-23 DIAGNOSIS — R918 Other nonspecific abnormal finding of lung field: Secondary | ICD-10-CM

## 2022-12-23 DIAGNOSIS — J432 Centrilobular emphysema: Secondary | ICD-10-CM

## 2022-12-25 DIAGNOSIS — J432 Centrilobular emphysema: Secondary | ICD-10-CM | POA: Diagnosis not present

## 2023-01-09 ENCOUNTER — Ambulatory Visit (INDEPENDENT_AMBULATORY_CARE_PROVIDER_SITE_OTHER): Payer: PPO | Admitting: Nurse Practitioner

## 2023-01-09 ENCOUNTER — Encounter: Payer: Self-pay | Admitting: Nurse Practitioner

## 2023-01-09 VITALS — BP 158/70 | HR 57 | Temp 98.3°F | Resp 16 | Ht 72.0 in | Wt 262.0 lb

## 2023-01-09 DIAGNOSIS — I5042 Chronic combined systolic (congestive) and diastolic (congestive) heart failure: Secondary | ICD-10-CM

## 2023-01-09 DIAGNOSIS — J432 Centrilobular emphysema: Secondary | ICD-10-CM | POA: Diagnosis not present

## 2023-01-09 DIAGNOSIS — E039 Hypothyroidism, unspecified: Secondary | ICD-10-CM | POA: Diagnosis not present

## 2023-01-09 DIAGNOSIS — I700 Atherosclerosis of aorta: Secondary | ICD-10-CM | POA: Diagnosis not present

## 2023-01-09 DIAGNOSIS — I10 Essential (primary) hypertension: Secondary | ICD-10-CM

## 2023-01-09 DIAGNOSIS — E7800 Pure hypercholesterolemia, unspecified: Secondary | ICD-10-CM | POA: Diagnosis not present

## 2023-01-09 DIAGNOSIS — K219 Gastro-esophageal reflux disease without esophagitis: Secondary | ICD-10-CM | POA: Diagnosis not present

## 2023-01-09 DIAGNOSIS — I7121 Aneurysm of the ascending aorta, without rupture: Secondary | ICD-10-CM | POA: Diagnosis not present

## 2023-01-09 DIAGNOSIS — Z0000 Encounter for general adult medical examination without abnormal findings: Secondary | ICD-10-CM

## 2023-01-09 LAB — LIPID PANEL
Cholesterol: 115 mg/dL (ref 0–200)
HDL: 32.5 mg/dL — ABNORMAL LOW (ref >39.00)
LDL Cholesterol: 63 mg/dL (ref 0–99)
NonHDL: 82.68
Total CHOL/HDL Ratio: 4
Triglycerides: 100 mg/dL (ref 0.0–149.0)
VLDL: 20 mg/dL (ref 0.0–40.0)

## 2023-01-09 LAB — COMPREHENSIVE METABOLIC PANEL
ALT: 14 U/L (ref 0–53)
AST: 13 U/L (ref 0–37)
Albumin: 3.9 g/dL (ref 3.5–5.2)
Alkaline Phosphatase: 93 U/L (ref 39–117)
BUN: 16 mg/dL (ref 6–23)
CO2: 28 mEq/L (ref 19–32)
Calcium: 9.1 mg/dL (ref 8.4–10.5)
Chloride: 107 mEq/L (ref 96–112)
Creatinine, Ser: 1.03 mg/dL (ref 0.40–1.50)
GFR: 68.62 mL/min (ref >60.00)
Glucose, Bld: 112 mg/dL — ABNORMAL HIGH (ref 70–99)
Potassium: 4.4 mEq/L (ref 3.5–5.1)
Sodium: 141 mEq/L (ref 135–145)
Total Bilirubin: 0.7 mg/dL (ref 0.2–1.2)
Total Protein: 6.2 g/dL (ref 6.0–8.3)

## 2023-01-09 LAB — TSH: TSH: 1.93 u[IU]/mL (ref 0.35–5.50)

## 2023-01-09 LAB — CBC
HCT: 38.1 % — ABNORMAL LOW (ref 39.0–52.0)
Hemoglobin: 12.3 g/dL — ABNORMAL LOW (ref 13.0–17.0)
MCHC: 32.4 g/dL (ref 30.0–36.0)
MCV: 86.3 fl (ref 78.0–100.0)
Platelets: 193 10*3/uL (ref 150.0–400.0)
RBC: 4.41 Mil/uL (ref 4.22–5.81)
RDW: 14.2 % (ref 11.5–15.5)
WBC: 7.1 10*3/uL (ref 4.0–10.5)

## 2023-01-09 NOTE — Progress Notes (Addendum)
Established Patient Office Visit  Subjective   Patient ID: Arthur DrownJames M Silveria Jr., male    DOB: 1941-04-26  Age: 82 y.o. MRN: [id number]  Chief Complaint  Patient presents with   Annual Exam    HPI  HTN: Patient currently maintained on carvedilol and Entresto for his heart failure patient is followed by Dr. Charlton HawsPeter Nishan cardiologist. States that he cecks it once a week 128/60s.  COPD: Patient is followed by pulmonology currently maintained on albuterol and Trelegy. States that he will use the albuterol once every 2 weeks.   GERD: Patient currently maintained on Protonix 40 mg daily.  Patient is been taking medication as prescribed and not really had any symptoms has not been having to change his diet per his report.  hypothyroidism: Patient currently maintained on levothyroxine 200 mcg daily.  Tolerating well  for complete physical and follow up of chronic conditions.  Immunizations: -Tetanus: Completed in 2011 -Influenza: Completed this season -Shingles: Completed Shingrix series -Pneumonia: Completed  Diet: Fair diet. 1-2 meals a day. States that he has been snacking. States drinks mostly water. Coffe 2-3 times a week  Exercise: No regular exercise.  Eye exam: Completes annually. Does not wear   Dental exam: Needs updating  Colonoscopy: Completed in 2019, no recall aged out Lung Cancer Screening: Completed in 12/15/2022 did show unchanged thoracic aortic aneurysm at 3.7 cm.  PSA: Aged out  Advance directives: states that he has talked with his son about his wishes.  Patient does not have any legal documentation in regards to advanced directive   Pain in legs: Patient states that he has weakness in his legs is worse with movement and he will have some pain.  Patient states he also has dependent edema.  Patient does have a history of aortic atherosclerosis so this could be secondary intermittent claudication.  Did offer to refer out for ultrasound and ABI but patient would like  to defer at the current juncture.  Voice disturbance: Patient states has been a 6 to 8 months.  States getting worse.  States he has not missed any of his acid reflux medication and does not get better if he clears his throat.  He would like to defer ENT in the current juncture.    Review of Systems  Constitutional:  Negative for chills and fever.  Respiratory:  Positive for shortness of breath.   Cardiovascular:  Positive for leg swelling. Negative for chest pain.  Gastrointestinal:  Negative for abdominal pain, blood in stool, constipation, diarrhea, nausea and vomiting.       BM daily   Genitourinary:  Negative for dysuria and hematuria.       Nocturia many times   Neurological:  Negative for tingling and headaches.  Psychiatric/Behavioral:  Negative for hallucinations and suicidal ideas.       Objective:     BP (!) 158/70   Pulse (!) 57   Temp 98.3 F (36.8 C)   Resp 16   Ht 6' (1.829 m)   Wt 262 lb (118.8 kg)   SpO2 98%   BMI 35.53 kg/m  BP Readings from Last 3 Encounters:  01/09/23 (!) 158/70  12/15/22 134/78  08/24/22 130/80   Wt Readings from Last 3 Encounters:  01/09/23 262 lb (118.8 kg)  12/15/22 261 lb (118.4 kg)  08/24/22 258 lb (117 kg)      Physical Exam Vitals and nursing note reviewed.  Constitutional:      Appearance: Normal appearance.  HENT:  Right Ear: Tympanic membrane, ear canal and external ear normal.     Left Ear: Tympanic membrane, ear canal and external ear normal.     Mouth/Throat:     Mouth: Mucous membranes are moist.     Pharynx: Oropharynx is clear.  Eyes:     Extraocular Movements: Extraocular movements intact.     Pupils: Pupils are equal, round, and reactive to light.  Cardiovascular:     Rate and Rhythm: Normal rate and regular rhythm.     Pulses: Normal pulses.     Heart sounds: Normal heart sounds.  Pulmonary:     Effort: Pulmonary effort is normal.     Breath sounds: Normal breath sounds.  Abdominal:      General: Bowel sounds are normal. There is no distension.     Palpations: There is no mass.     Tenderness: There is no abdominal tenderness.     Hernia: No hernia is present.  Musculoskeletal:     Right lower leg: 1+ Pitting Edema present.     Left lower leg: 1+ Pitting Edema present.  Lymphadenopathy:     Cervical: No cervical adenopathy.  Skin:    General: Skin is warm.  Neurological:     General: No focal deficit present.     Mental Status: He is alert.     Deep Tendon Reflexes:     Reflex Scores:      Bicep reflexes are 2+ on the right side and 2+ on the left side.      Patellar reflexes are 2+ on the right side and 2+ on the left side.    Comments: Bilateral upper and lower extremity strength 5/5  Psychiatric:        Mood and Affect: Mood normal.        Behavior: Behavior normal.        Thought Content: Thought content normal.        Judgment: Judgment normal.      No results found for any visits on 01/09/23.    The ASCVD Risk score (Arnett DK, et al., 2019) failed to calculate for the following reasons:   The 2019 ASCVD risk score is only valid for ages 64 to 49    Assessment & Plan:   Problem List Items Addressed This Visit       Cardiovascular and Mediastinum   Essential hypertension    Patient currently mains on carvedilol and Entresto.  Followed by cardiology.  Blood pressure has been back-and-forth at home elevated on both checks today.  He does have follow-up with cardiology next week continue medication as prescribed      Relevant Orders   CBC   Comprehensive metabolic panel   Aortic atherosclerosis    Patient currently maintained on atorvastatin 40 mg.  Does have what seems to be like intermittent claudication likely secondary to plaque buildup.  Did offer to do ABIs patient deferred at this current juncture      Chronic combined systolic and diastolic CHF (congestive heart failure)    Patient currently followed by cardiology and on Entresto.   Follow-up as recommended take medication as prescribed      Thoracic aortic aneurysm    Requires yearly follow-ups recently had a CT of the chest that showed a 3.7 cm thoracic aortic aneurysm unchanged from the year prior        Respiratory   Centrilobular emphysema    Patient is followed by pulmonology.  Recent LDCT done 12/2022.  Digestive   GERD (gastroesophageal reflux disease)    Patient currently maintained on Protonix 40 mg daily continue medication as prescribed        Endocrine   Hypothyroidism (acquired)    Patient currently maintained on 200 mcg of levothyroxine daily.  Continue pending TSH      Relevant Orders   TSH     Other   HYPERCHOLESTEROLEMIA    Patient currently followed by cardiology and on atorvastatin 40 mg daily.  Continue medication as prescribed pending lipid panel today      Relevant Orders   Lipid panel   Preventative health care - Primary    Discussed age-appropriate immunizations and screening exams.  Patient is up-to-date on all vaccinations.  He is aged out for [facility] screening and prostate cancer screening.  Patient does not have advanced directives.  Did review patient's personal, social, surgical, family history.       Return in about 6 months (around 07/11/2023) for BP recheck/ chronic condiditon recheck .    Audria Nine, NP

## 2023-01-09 NOTE — Assessment & Plan Note (Signed)
Discussed age-appropriate immunizations and screening exams.  Patient is up-to-date on all vaccinations.  He is aged out for [facility] screening and prostate cancer screening.  Patient does not have advanced directives.  Did review patient's personal, social, surgical, family history.

## 2023-01-09 NOTE — Assessment & Plan Note (Signed)
Patient currently followed by cardiology and on atorvastatin 40 mg daily.  Continue medication as prescribed pending lipid panel today

## 2023-01-09 NOTE — Assessment & Plan Note (Signed)
Patient is followed by pulmonology.  Recent LDCT done 12/2022.

## 2023-01-09 NOTE — Assessment & Plan Note (Signed)
Patient currently maintained on Protonix 40 mg daily continue medication as prescribed

## 2023-01-09 NOTE — Assessment & Plan Note (Signed)
Patient currently mains on carvedilol and Entresto.  Followed by cardiology.  Blood pressure has been back-and-forth at home elevated on both checks today.  He does have follow-up with cardiology next week continue medication as prescribed

## 2023-01-09 NOTE — Assessment & Plan Note (Signed)
Patient currently maintained on 200 mcg of levothyroxine daily.  Continue pending TSH

## 2023-01-09 NOTE — Assessment & Plan Note (Signed)
Patient currently followed by cardiology and on Entresto.  Follow-up as recommended take medication as prescribed

## 2023-01-09 NOTE — Assessment & Plan Note (Signed)
Patient currently maintained on atorvastatin 40 mg.  Does have what seems to be like intermittent claudication likely secondary to plaque buildup.  Did offer to do ABIs patient deferred at this current juncture

## 2023-01-09 NOTE — Assessment & Plan Note (Signed)
Requires yearly follow-ups recently had a CT of the chest that showed a 3.7 cm thoracic aortic aneurysm unchanged from the year prior
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
## 2023-01-12 ENCOUNTER — Ambulatory Visit: Payer: PPO | Attending: Physician Assistant | Admitting: Physician Assistant

## 2023-01-12 ENCOUNTER — Encounter: Payer: Self-pay | Admitting: Physician Assistant

## 2023-01-12 VITALS — BP 140/80 | HR 58 | Ht 72.0 in | Wt 263.4 lb

## 2023-01-12 DIAGNOSIS — R079 Chest pain, unspecified: Secondary | ICD-10-CM | POA: Diagnosis not present

## 2023-01-12 DIAGNOSIS — J449 Chronic obstructive pulmonary disease, unspecified: Secondary | ICD-10-CM | POA: Diagnosis not present

## 2023-01-12 DIAGNOSIS — I428 Other cardiomyopathies: Secondary | ICD-10-CM | POA: Diagnosis not present

## 2023-01-12 DIAGNOSIS — I10 Essential (primary) hypertension: Secondary | ICD-10-CM | POA: Diagnosis not present

## 2023-01-12 DIAGNOSIS — Z720 Tobacco use: Secondary | ICD-10-CM

## 2023-01-12 DIAGNOSIS — R55 Syncope and collapse: Secondary | ICD-10-CM | POA: Diagnosis not present

## 2023-01-12 DIAGNOSIS — E039 Hypothyroidism, unspecified: Secondary | ICD-10-CM

## 2023-01-12 DIAGNOSIS — E785 Hyperlipidemia, unspecified: Secondary | ICD-10-CM | POA: Diagnosis not present

## 2023-01-12 NOTE — Patient Instructions (Signed)
Medication Instructions:  Your physician recommends that you continue on your current medications as directed. Please refer to the Current Medication list given to you today.  *If you need a refill on your cardiac medications before your next appointment, please call your pharmacy*   Lab Work: None ordered If you have labs (blood work) drawn today and your tests are completely normal, you will receive your results only by: MyChart Message (if you have MyChart) OR A paper copy in the mail If you have any lab test that is abnormal or we need to change your treatment, we will call you to review the results.   Testing/Procedures: Your provider has recommended that you have a Chest CTA.   Follow-Up: At [facility], you and your health needs are our priority.  As part of our continuing mission to provide you with exceptional heart care, we have created designated Provider Care Teams.  These Care Teams include your primary Cardiologist (physician) and Advanced Practice Providers (APPs -  Physician Assistants and Nurse Practitioners) who all work together to provide you with the care you need, when you need it.  We recommend signing up for the patient portal called "MyChart".  Sign up information is provided on this After Visit Summary.  MyChart is used to connect with patients for Virtual Visits (Telemedicine).  Patients are able to view lab/test results, encounter notes, upcoming appointments, etc.  Non-urgent messages can be sent to your provider as well.   To learn more about what you can do with MyChart, go to [email or web address].    Your next appointment:   1 year(s)  Provider:   Charlton Haws, MD    Other Instructions Weigh every morning and let us know if you have a weight gain of 2 or more lbs overnight or 5 or more lbs in a week.  Low-Sodium Eating Plan Sodium, which is an element that makes up salt, helps you maintain a healthy balance of fluids in your body. Too much  sodium can increase your blood pressure and cause fluid and waste to be held in your body. Your health care provider or dietitian may recommend following this plan if you have high blood pressure (hypertension), kidney disease, liver disease, or heart failure. Eating less sodium can help lower your blood pressure, reduce swelling, and protect your heart, liver, and kidneys. What are tips for following this plan? Reading food labels The Nutrition Facts label lists the amount of sodium in one serving of the food. If you eat more than one serving, you must multiply the listed amount of sodium by the number of servings. Choose foods with less than 140 mg of sodium per serving. Avoid foods with 300 mg of sodium or more per serving. Shopping  Look for lower-sodium products, often labeled as "low-sodium" or "no salt added." Always check the sodium content, even if foods are labeled as "unsalted" or "no salt added." Buy fresh foods. Avoid canned foods and pre-made or frozen meals. Avoid canned, cured, or processed meats. Buy breads that have less than 80 mg of sodium per slice. Cooking  Eat more home-cooked food and less restaurant, buffet, and fast food. Avoid adding salt when cooking. Use salt-free seasonings or herbs instead of table salt or sea salt. Check with your health care provider or pharmacist before using salt substitutes. Cook with plant-based oils, such as canola, sunflower, or olive oil. Meal planning When eating at a restaurant, ask that your food be prepared with less salt or no  salt, if possible. Avoid dishes labeled as brined, pickled, cured, smoked, or made with soy sauce, miso, or teriyaki sauce. Avoid foods that contain MSG (monosodium glutamate). MSG is sometimes added to [place] food, bouillon, and some canned foods. Make meals that can be grilled, baked, poached, roasted, or steamed. These are generally made with less sodium. General information Most people on this plan  should limit their sodium intake to 1,500-2,000 mg (milligrams) of sodium each day. What foods should I eat? Fruits Fresh, frozen, or canned fruit. Fruit juice. Vegetables Fresh or frozen vegetables. "No salt added" canned vegetables. "No salt added" tomato sauce and paste. Low-sodium or reduced-sodium tomato and vegetable juice. Grains Low-sodium cereals, including oats, puffed wheat and rice, and shredded wheat. Low-sodium crackers. Unsalted rice. Unsalted pasta. Low-sodium bread. Whole-grain breads and whole-grain pasta. Meats and other proteins Fresh or frozen (no salt added) meat, poultry, seafood, and fish. Low-sodium canned tuna and salmon. Unsalted nuts. Dried peas, beans, and lentils without added salt. Unsalted canned beans. Eggs. Unsalted nut butters. Dairy Milk. Soy milk. Cheese that is naturally low in sodium, such as ricotta cheese, fresh mozzarella, or Swiss cheese. Low-sodium or reduced-sodium cheese. Cream cheese. Yogurt. Seasonings and condiments Fresh and dried herbs and spices. Salt-free seasonings. Low-sodium mustard and ketchup. Sodium-free salad dressing. Sodium-free light mayonnaise. Fresh or refrigerated horseradish. Lemon juice. Vinegar. Other foods Homemade, reduced-sodium, or low-sodium soups. Unsalted popcorn and pretzels. Low-salt or salt-free chips. The items listed above may not be a complete list of foods and beverages you can eat. Contact a dietitian for more information. What foods should I avoid? Vegetables Sauerkraut, pickled vegetables, and relishes. Olives. [place] fries. Onion rings. Regular canned vegetables (not low-sodium or reduced-sodium). Regular canned tomato sauce and paste (not low-sodium or reduced-sodium). Regular tomato and vegetable juice (not low-sodium or reduced-sodium). Frozen vegetables in sauces. Grains Instant hot cereals. Bread stuffing, pancake, and biscuit mixes. Croutons. Seasoned rice or pasta mixes. Noodle soup cups. Boxed or  frozen macaroni and cheese. Regular salted crackers. Self-rising flour. Meats and other proteins Meat or fish that is salted, canned, smoked, spiced, or pickled. Precooked or cured meat, such as sausages or meat loaves. Tomasa Blase. Ham. Pepperoni. Hot dogs. Corned beef. Chipped beef. Salt pork. Jerky. Pickled herring. Anchovies and sardines. Regular canned tuna. Salted nuts. Dairy Processed cheese and cheese spreads. Hard cheeses. Cheese curds. Blue cheese. Feta cheese. String cheese. Regular cottage cheese. Buttermilk. Canned milk. Fats and oils Salted butter. Regular margarine. Ghee. Bacon fat. Seasonings and condiments Onion salt, garlic salt, seasoned salt, table salt, and sea salt. Canned and packaged gravies. Worcestershire sauce. Tartar sauce. Barbecue sauce. Teriyaki sauce. Soy sauce, including reduced-sodium. Steak sauce. Fish sauce. Oyster sauce. Cocktail sauce. Horseradish that you find on the shelf. Regular ketchup and mustard. Meat flavorings and tenderizers. Bouillon cubes. Hot sauce. Pre-made or packaged marinades. Pre-made or packaged taco seasonings. Relishes. Regular salad dressings. Salsa. Other foods Salted popcorn and pretzels. Corn chips and puffs. Potato and tortilla chips. Canned or dried soups. Pizza. Frozen entrees and pot pies. The items listed above may not be a complete list of foods and beverages you should avoid. Contact a dietitian for more information. Summary Eating less sodium can help lower your blood pressure, reduce swelling, and protect your heart, liver, and kidneys. Most people on this plan should limit their sodium intake to 1,500-2,000 mg (milligrams) of sodium each day. Canned, boxed, and frozen foods are high in sodium. Restaurant foods, fast foods, and pizza are also very high  in sodium. You also get sodium by adding salt to food. Try to cook at home, eat more fresh fruits and vegetables, and eat less fast food and canned, processed, or prepared foods. This  information is not intended to replace advice given to you by your health care provider. Make sure you discuss any questions you have with your health care provider. Document Revised: 08/26/2019 Document Reviewed: 08/21/2019 Elsevier Patient Education  2023 [employer]  Heart-Healthy Eating Plan Many factors influence your heart health, including eating and exercise habits. Heart health is also called coronary health. Coronary risk increases with abnormal blood fat (lipid) levels. A heart-healthy eating plan includes limiting unhealthy fats, increasing healthy fats, limiting salt (sodium) intake, and making other diet and lifestyle changes. What is my plan? Your health care provider may recommend that: You limit your fat intake to _________% or less of your total calories each day. You limit your saturated fat intake to _________% or less of your total calories each day. You limit the amount of cholesterol in your diet to less than _________ mg per day. You limit the amount of sodium in your diet to less than _________ mg per day. What are tips for following this plan? Cooking Cook foods using methods other than frying. Baking, boiling, grilling, and broiling are all good options. Other ways to reduce fat include: Removing the skin from poultry. Removing all visible fats from meats. Steaming vegetables in water or broth. Meal planning  At meals, imagine dividing your plate into fourths: Fill one-half of your plate with vegetables and green salads. Fill one-fourth of your plate with whole grains. Fill one-fourth of your plate with lean protein foods. Eat 2-4 cups of vegetables per day. One cup of vegetables equals 1 cup (91 g) broccoli or cauliflower florets, 2 medium carrots, 1 large bell pepper, 1 large sweet potato, 1 large tomato, 1 medium white potato, 2 cups (150 g) raw leafy greens. Eat 1-2 cups of fruit per day. One cup of fruit equals 1 small apple, 1 large banana, 1 cup (237  g) mixed fruit, 1 large orange,  cup (82 g) dried fruit, 1 cup (240 mL) 100% fruit juice. Eat more foods that contain soluble fiber. Examples include apples, broccoli, carrots, beans, peas, and barley. Aim to get 25-30 g of fiber per day. Increase your consumption of legumes, nuts, and seeds to 4-5 servings per week. One serving of dried beans or legumes equals  cup (90 g) cooked, 1 serving of nuts is  oz (12 almonds, 24 pistachios, or 7 walnut halves), and 1 serving of seeds equals  oz (8 g). Fats Choose healthy fats more often. Choose monounsaturated and polyunsaturated fats, such as olive and canola oils, avocado oil, flaxseeds, walnuts, almonds, and seeds. Eat more omega-3 fats. Choose salmon, mackerel, sardines, tuna, flaxseed oil, and ground flaxseeds. Aim to eat fish at least 2 times each week. Check food labels carefully to identify foods with trans fats or high amounts of saturated fat. Limit saturated fats. These are found in animal products, such as meats, butter, and cream. Plant sources of saturated fats include palm oil, palm kernel oil, and coconut oil. Avoid foods with partially hydrogenated oils in them. These contain trans fats. Examples are stick margarine, some tub margarines, cookies, crackers, and other baked goods. Avoid fried foods. General information Eat more home-cooked food and less restaurant, buffet, and fast food. Limit or avoid alcohol. Limit foods that are high in added sugar and simple starches such  as foods made using white refined flour (white breads, pastries, sweets). Lose weight if you are overweight. Losing just 5-10% of your body weight can help your overall health and prevent diseases such as diabetes and heart disease. Monitor your sodium intake, especially if you have high blood pressure. Talk with your health care provider about your sodium intake. Try to incorporate more vegetarian meals weekly. What foods should I eat? Fruits All fresh, canned (in  natural juice), or frozen fruits. Vegetables Fresh or frozen vegetables (raw, steamed, roasted, or grilled). Green salads. Grains Most grains. Choose whole wheat and whole grains most of the time. Rice and pasta, including brown rice and pastas made with whole wheat. Meats and other proteins Lean, well-trimmed beef, veal, pork, and lamb. Chicken and [place] without skin. All fish and shellfish. Wild duck, rabbit, pheasant, and venison. Egg whites or low-cholesterol egg substitutes. Dried beans, peas, lentils, and tofu. Seeds and most nuts. Dairy Low-fat or nonfat cheeses, including ricotta and mozzarella. Skim or 1% milk (liquid, powdered, or evaporated). Buttermilk made with low-fat milk. Nonfat or low-fat yogurt. Fats and oils Non-hydrogenated (trans-free) margarines. Vegetable oils, including soybean, sesame, sunflower, olive, avocado, peanut, safflower, corn, canola, and cottonseed. Salad dressings or mayonnaise made with a vegetable oil. Beverages Water (mineral or sparkling). Coffee and tea. Unsweetened ice tea. Diet beverages. Sweets and desserts Sherbet, gelatin, and fruit ice. Small amounts of dark chocolate. Limit all sweets and desserts. Seasonings and condiments All seasonings and condiments. The items listed above may not be a complete list of foods and beverages you can eat. Contact a dietitian for more options. What foods should I avoid? Fruits Canned fruit in heavy syrup. Fruit in cream or butter sauce. Fried fruit. Limit coconut. Vegetables Vegetables cooked in cheese, cream, or butter sauce. Fried vegetables. Grains Breads made with saturated or trans fats, oils, or whole milk. Croissants. Sweet rolls. Donuts. High-fat crackers, such as cheese crackers and chips. Meats and other proteins Fatty meats, such as hot dogs, ribs, sausage, bacon, rib-eye roast or steak. High-fat deli meats, such as salami and bologna. Caviar. Domestic duck and goose. Organ meats, such as  liver. Dairy Cream, sour cream, cream cheese, and creamed cottage cheese. Whole-milk cheeses. Whole or 2% milk (liquid, evaporated, or condensed). Whole buttermilk. Cream sauce or high-fat cheese sauce. Whole-milk yogurt. Fats and oils Meat fat, or shortening. Cocoa butter, hydrogenated oils, palm oil, coconut oil, palm kernel oil. Solid fats and shortenings, including bacon fat, salt pork, lard, and butter. Nondairy cream substitutes. Salad dressings with cheese or sour cream. Beverages Regular sodas and any drinks with added sugar. Sweets and desserts Frosting. Pudding. Cookies. Cakes. Pies. Milk chocolate or white chocolate. Buttered syrups. Full-fat ice cream or ice cream drinks. The items listed above may not be a complete list of foods and beverages to avoid. Contact a dietitian for more information. Summary Heart-healthy meal planning includes limiting unhealthy fats, increasing healthy fats, limiting salt (sodium) intake and making other diet and lifestyle changes. Lose weight if you are overweight. Losing just 5-10% of your body weight can help your overall health and prevent diseases such as diabetes and heart disease. Focus on eating a balance of foods, including fruits and vegetables, low-fat or nonfat dairy, lean protein, nuts and legumes, whole grains, and heart-healthy oils and fats. This information is not intended to replace advice given to you by your health care provider. Make sure you discuss any questions you have with your health care provider. Document Revised: 10/25/2021  Document Reviewed: 10/25/2021 Elsevier Patient Education  2023 [employer].

## 2023-01-29 ENCOUNTER — Other Ambulatory Visit: Payer: Self-pay | Admitting: Nurse Practitioner

## 2023-01-29 DIAGNOSIS — E039 Hypothyroidism, unspecified: Secondary | ICD-10-CM

## 2023-02-08 ENCOUNTER — Telehealth: Payer: Self-pay

## 2023-02-08 NOTE — Telephone Encounter (Signed)
Spoke with patient and confirmed appointment.

## 2023-02-08 NOTE — Telephone Encounter (Signed)
Sending note to [facility] support. 

## 2023-02-08 NOTE — Telephone Encounter (Signed)
[facility] Night - Client Nonclinical Telephone Record  [facility] Client [facility] Night - Client Client Site [facility] [place] - Night Provider Audria Nine- NP Contact Type Call Who Is Calling Patient / Member / Family / Caregiver Caller Name Reagen Longenberger Caller Phone Number [phone] Patient Name Arthur [name] Patient DOB 06-12-1941 Call Type Message Only Information Provided Reason for Call Request for General Office Information Initial Comment Caller states he is confirming his appt for tomorrow. Disp. Time Disposition Final User 02/08/2023 1:51:47 PM General Information Provided Yes Sedalia Muta Call Closed By: Sedalia Muta Transaction Date/Time: 02/08/2023 1:48:55 PM (ET

## 2023-02-09 ENCOUNTER — Ambulatory Visit (INDEPENDENT_AMBULATORY_CARE_PROVIDER_SITE_OTHER): Payer: PPO

## 2023-02-09 VITALS — Ht 72.0 in | Wt 260.0 lb

## 2023-02-09 DIAGNOSIS — Z0000 Encounter for general adult medical examination without abnormal findings: Secondary | ICD-10-CM

## 2023-02-09 NOTE — Patient Instructions (Signed)
[name] , Thank you for taking time to come for your Medicare Wellness Visit. I appreciate your ongoing commitment to your health goals. Please review the following plan we discussed and let me know if I can assist you in the future.   These are the goals we discussed:  Goals      Patient Stated     Would like to keep staying healthy living     Patient Stated     No new goals        This is a list of the screening recommended for you and due dates:  Health Maintenance  Topic Date Due   COVID-19 Vaccine (3 - Moderna risk series) 02/03/2020   DTaP/Tdap/Td vaccine (2 - Td or Tdap) 04/26/2020   Flu Shot  05/04/2023   Medicare Annual Wellness Visit  02/09/2024   Pneumonia Vaccine  Completed   Zoster (Shingles) Vaccine  Completed   HPV Vaccine  Aged Out    Advanced directives: Please bring a copy of your health care power of attorney and living will to the office to be added to your chart at your convenience.   Conditions/risks identified: Aim for 30 minutes of exercise or brisk walking, 6-8 glasses of water, and 5 servings of fruits and vegetables each day.   Next appointment: Follow up in one year for your annual wellness visit. 02/13/24 @ 10:00 televisit  Preventive Care 65 Years and Older, Male  Preventive care refers to lifestyle choices and visits with your health care provider that can promote health and wellness. What does preventive care include? A yearly physical exam. This is also called an annual well check. Dental exams once or twice a year. Routine eye exams. Ask your health care provider how often you should have your eyes checked. Personal lifestyle choices, including: Daily care of your teeth and gums. Regular physical activity. Eating a healthy diet. Avoiding tobacco and drug use. Limiting alcohol use. Practicing safe sex. Taking low doses of aspirin every day. Taking vitamin and mineral supplements as recommended by your health care provider. What  happens during an annual well check? The services and screenings done by your health care provider during your annual well check will depend on your age, overall health, lifestyle risk factors, and family history of disease. Counseling  Your health care provider may ask you questions about your: Alcohol use. Tobacco use. Drug use. Emotional well-being. Home and relationship well-being. Sexual activity. Eating habits. History of falls. Memory and ability to understand (cognition). Work and work Astronomer. Screening  You may have the following tests or measurements: Height, weight, and BMI. Blood pressure. Lipid and cholesterol levels. These may be checked every 5 years, or more frequently if you are over 4 years old. Skin check. Lung cancer screening. You may have this screening every year starting at age 54 if you have a 30-pack-year history of smoking and currently smoke or have quit within the past 15 years. Fecal occult blood test (FOBT) of the stool. You may have this test every year starting at age 59. Flexible sigmoidoscopy or colonoscopy. You may have a sigmoidoscopy every 5 years or a colonoscopy every 10 years starting at age 59. Prostate cancer screening. Recommendations will vary depending on your family history and other risks. Hepatitis C blood test. Hepatitis B blood test. Sexually transmitted disease (STD) testing. Diabetes screening. This is done by checking your blood sugar (glucose) after you have not eaten for a while (fasting). You may have this done  every 1-3 years. Abdominal aortic aneurysm (AAA) screening. You may need this if you are a current or former smoker. Osteoporosis. You may be screened starting at age 55 if you are at high risk. Talk with your health care provider about your test results, treatment options, and if necessary, the need for more tests. Vaccines  Your health care provider may recommend certain vaccines, such as: Influenza vaccine. This  is recommended every year. Tetanus, diphtheria, and acellular pertussis (Tdap, Td) vaccine. You may need a Td booster every 10 years. Zoster vaccine. You may need this after age 53. Pneumococcal 13-valent conjugate (PCV13) vaccine. One dose is recommended after age 70. Pneumococcal polysaccharide (PPSV23) vaccine. One dose is recommended after age 70. Talk to your health care provider about which screenings and vaccines you need and how often you need them. This information is not intended to replace advice given to you by your health care provider. Make sure you discuss any questions you have with your health care provider. Document Released: 10/16/2015 Document Revised: 06/08/2016 Document Reviewed: 07/21/2015 Elsevier Interactive Patient Education  2017 [place] Prevention in the Home Falls can cause injuries. They can happen to people of all ages. There are many things you can do to make your home safe and to help prevent falls. What can I do on the outside of my home? Regularly fix the edges of walkways and driveways and fix any cracks. Remove anything that might make you trip as you walk through a door, such as a raised step or threshold. Trim any bushes or trees on the path to your home. Use bright outdoor lighting. Clear any walking paths of anything that might make someone trip, such as rocks or tools. Regularly check to see if handrails are loose or broken. Make sure that both sides of any steps have handrails. Any raised decks and porches should have guardrails on the edges. Have any leaves, snow, or ice cleared regularly. Use sand or salt on walking paths during winter. Clean up any spills in your garage right away. This includes oil or grease spills. What can I do in the bathroom? Use night lights. Install grab bars by the toilet and in the tub and shower. Do not use towel bars as grab bars. Use non-skid mats or decals in the tub or shower. If you need to sit down  in the shower, use a plastic, non-slip stool. Keep the floor dry. Clean up any water that spills on the floor as soon as it happens. Remove soap buildup in the tub or shower regularly. Attach bath mats securely with double-sided non-slip rug tape. Do not have throw rugs and other things on the floor that can make you trip. What can I do in the bedroom? Use night lights. Make sure that you have a light by your bed that is easy to reach. Do not use any sheets or blankets that are too big for your bed. They should not hang down onto the floor. Have a firm chair that has side arms. You can use this for support while you get dressed. Do not have throw rugs and other things on the floor that can make you trip. What can I do in the kitchen? Clean up any spills right away. Avoid walking on wet floors. Keep items that you use a lot in easy-to-reach places. If you need to reach something above you, use a strong step stool that has a grab bar. Keep electrical cords out of the  way. Do not use floor polish or wax that makes floors slippery. If you must use wax, use non-skid floor wax. Do not have throw rugs and other things on the floor that can make you trip. What can I do with my stairs? Do not leave any items on the stairs. Make sure that there are handrails on both sides of the stairs and use them. Fix handrails that are broken or loose. Make sure that handrails are as Minnie as the stairways. Check any carpeting to make sure that it is firmly attached to the stairs. Fix any carpet that is loose or worn. Avoid having throw rugs at the top or bottom of the stairs. If you do have throw rugs, attach them to the floor with carpet tape. Make sure that you have a light switch at the top of the stairs and the bottom of the stairs. If you do not have them, ask someone to add them for you. What else can I do to help prevent falls? Wear shoes that: Do not have high heels. Have rubber bottoms. Are comfortable  and fit you well. Are closed at the toe. Do not wear sandals. If you use a stepladder: Make sure that it is fully opened. Do not climb a closed stepladder. Make sure that both sides of the stepladder are locked into place. Ask someone to hold it for you, if possible. Clearly mark and make sure that you can see: Any grab bars or handrails. First and last steps. Where the edge of each step is. Use tools that help you move around (mobility aids) if they are needed. These include: Canes. Walkers. Scooters. Crutches. Turn on the lights when you go into a dark area. Replace any light bulbs as soon as they burn out. Set up your furniture so you have a clear path. Avoid moving your furniture around. If any of your floors are uneven, fix them. If there are any pets around you, be aware of where they are. Review your medicines with your doctor. Some medicines can make you feel dizzy. This can increase your chance of falling. Ask your doctor what other things that you can do to help prevent falls. This information is not intended to replace advice given to you by your health care provider. Make sure you discuss any questions you have with your health care provider. Document Released: 07/16/2009 Document Revised: 02/25/2016 Document Reviewed: 10/24/2014 Elsevier Interactive Patient Education  2017 [employer].
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
## 2023-02-22 ENCOUNTER — Other Ambulatory Visit: Payer: Self-pay | Admitting: Pulmonary Disease

## 2023-03-06 ENCOUNTER — Other Ambulatory Visit: Payer: Self-pay | Admitting: Cardiovascular Disease

## 2023-03-12 ENCOUNTER — Other Ambulatory Visit: Payer: Self-pay | Admitting: Cardiovascular Disease

## 2023-03-12 DIAGNOSIS — I10 Essential (primary) hypertension: Secondary | ICD-10-CM

## 2023-03-23 ENCOUNTER — Ambulatory Visit (INDEPENDENT_AMBULATORY_CARE_PROVIDER_SITE_OTHER): Payer: PPO | Admitting: Nurse Practitioner

## 2023-03-23 ENCOUNTER — Encounter: Payer: Self-pay | Admitting: Nurse Practitioner

## 2023-03-23 VITALS — BP 132/66 | HR 65 | Temp 97.5°F | Ht 72.0 in | Wt 252.0 lb

## 2023-03-23 DIAGNOSIS — I5030 Unspecified diastolic (congestive) heart failure: Secondary | ICD-10-CM

## 2023-03-23 DIAGNOSIS — R5383 Other fatigue: Secondary | ICD-10-CM | POA: Diagnosis not present

## 2023-03-23 DIAGNOSIS — E039 Hypothyroidism, unspecified: Secondary | ICD-10-CM

## 2023-03-23 DIAGNOSIS — J432 Centrilobular emphysema: Secondary | ICD-10-CM | POA: Diagnosis not present

## 2023-03-23 DIAGNOSIS — R09A2 Foreign body sensation, throat: Secondary | ICD-10-CM

## 2023-03-23 LAB — COMPREHENSIVE METABOLIC PANEL
ALT: 15 U/L (ref 0–53)
AST: 14 U/L (ref 0–37)
Albumin: 4 g/dL (ref 3.5–5.2)
Alkaline Phosphatase: 102 U/L (ref 39–117)
BUN: 25 mg/dL — ABNORMAL HIGH (ref 6–23)
CO2: 27 mEq/L (ref 19–32)
Calcium: 9.2 mg/dL (ref 8.4–10.5)
Chloride: 103 mEq/L (ref 96–112)
Creatinine, Ser: 1.18 mg/dL (ref 0.40–1.50)
GFR: 58.21 mL/min — ABNORMAL LOW (ref >60.00)
Glucose, Bld: 101 mg/dL — ABNORMAL HIGH (ref 70–99)
Potassium: 4.5 mEq/L (ref 3.5–5.1)
Sodium: 141 mEq/L (ref 135–145)
Total Bilirubin: 0.7 mg/dL (ref 0.2–1.2)
Total Protein: 6.8 g/dL (ref 6.0–8.3)

## 2023-03-23 LAB — VITAMIN B12: Vitamin B-12: 381 pg/mL (ref 211–911)

## 2023-03-23 LAB — CBC
HCT: 39.3 % (ref 39.0–52.0)
Hemoglobin: 12.5 g/dL — ABNORMAL LOW (ref 13.0–17.0)
MCHC: 31.8 g/dL (ref 30.0–36.0)
MCV: 86.2 fl (ref 78.0–100.0)
Platelets: 234 10*3/uL (ref 150.0–400.0)
RBC: 4.55 Mil/uL (ref 4.22–5.81)
RDW: 14.1 % (ref 11.5–15.5)
WBC: 8.3 10*3/uL (ref 4.0–10.5)

## 2023-03-23 LAB — TSH: TSH: 2.95 u[IU]/mL (ref 0.35–5.50)

## 2023-03-23 LAB — BRAIN NATRIURETIC PEPTIDE: Pro B Natriuretic peptide (BNP): 68 pg/mL (ref 0.0–100.0)

## 2023-03-23 LAB — VITAMIN D 25 HYDROXY (VIT D DEFICIENCY, FRACTURES): VITD: 19.04 ng/mL — ABNORMAL LOW (ref 30.00–100.00)

## 2023-03-23 MED ORDER — FLUTICASONE PROPIONATE 50 MCG/ACT NA SUSP
2.0000 | Freq: Every day | NASAL | 0 refills | Status: DC
Start: 2023-03-23 — End: 2023-08-21

## 2023-03-23 MED ORDER — PANTOPRAZOLE SODIUM 40 MG PO TBEC
40.0000 mg | DELAYED_RELEASE_TABLET | Freq: Two times a day (BID) | ORAL | 0 refills | Status: DC
Start: 2023-03-23 — End: 2023-05-15

## 2023-03-23 NOTE — Progress Notes (Signed)
Acute Office Visit  Subjective:     Patient ID: Arthur [name]., male    DOB: [birth year], 82 y.o.   MRN: [id number]  Chief Complaint  Patient presents with   Fatigue    C/o having no energy, feels like something caught in throat and worsening COPD.    HPI Patient is in today for multiple complaints with a history of Thoracic aneurysm, HTN, HFpEF, COPD,hypothyroidism  Fatigue: states that it has been getting worse. States worse ocer the past 6 months. States that sicne the hot weather it has been worse. Hot weather makes the breathing. States that he is using the trelegy and albuterol inhaler.  Swallowing trouble: states that he will have trouble getting food down. State s that he has a globus sensation. States that he will cough and not get much up. States that he is taking the Protonix but with Genella Rife worse at night when he lays down. States that he doe snot have regular PND.   COPD: followed by Dr. Serena Croissant.  Patient currently on Trelegy and albuterol inhaler as needed.  Review of Systems  Constitutional:  Negative for chills and fever.  Respiratory:  Positive for shortness of breath.   Cardiovascular:  Negative for chest pain.  Neurological:  Negative for headaches.  Psychiatric/Behavioral:  Negative for hallucinations and suicidal ideas.         Objective:    BP 132/66   Pulse 65   Temp (!) 97.5 F (36.4 C) (Temporal)   Ht 6' (1.829 m)   Wt 252 lb (114.3 kg)   SpO2 94%   BMI 34.18 kg/m  BP Readings from Last 3 Encounters:  03/23/23 132/66  01/12/23 (!) 140/80  01/09/23 (!) 158/70   Wt Readings from Last 3 Encounters:  03/23/23 252 lb (114.3 kg)  02/09/23 260 lb (117.9 kg)  01/12/23 263 lb 6.4 oz (119.5 kg)      Physical Exam Vitals and nursing note reviewed.  Constitutional:      Appearance: Normal appearance.  Cardiovascular:     Rate and Rhythm: Normal rate and regular rhythm.     Heart sounds: Normal heart sounds.  Pulmonary:     Effort: Pulmonary  effort is normal.     Breath sounds: Normal breath sounds.  Musculoskeletal:     Right lower leg: Edema (trace) present.     Left lower leg: Edema (trace) present.  Neurological:     Mental Status: He is alert.     No results found for any visits on 03/23/23.      Assessment & Plan:   Problem List Items Addressed This Visit       Cardiovascular and Mediastinum   (HFpEF) heart failure with preserved ejection fraction (HCC)    History of the same.  Patient is on Entresto.  Taking medication as prescribed.  Recently saw cardiology.  Given increasing fatigue we will check BNP.  Patient does have trace edema in his legs      Relevant Orders   CBC   Comprehensive metabolic panel   Brain natriuretic peptide     Respiratory   Centrilobular emphysema (HCC) - Primary    On Trelegy and albuterol inhaler as needed.  Patient states however right worse      Relevant Medications   fluticasone (FLONASE) 50 MCG/ACT nasal spray     Endocrine   Hypothyroidism (acquired)    Last TSH within normal limits patient still currently maintained on levothyroxine 200 mcg daily pending TSH  today      Relevant Orders   TSH     Other   Fatigue    Ambiguous in nature.  Patient does have COPD and heart failure will check basic labs inclusive of CBC, CMP, BNP, vitamin D, vitamin B12.      Relevant Orders   TSH   VITAMIN D 25 Hydroxy (Vit-D Deficiency, Fractures)   Vitamin B12   Globus sensation    Patient is having globus sensation but also breakthrough heartburn GERD symptoms.  Will increase Protonix from 40 mg to 40 mg twice daily for a month new prescription sent in.  Also placed patient on metformin Tikosyn nasal spray to make sure it is not related to postnasal      Relevant Medications   pantoprazole (PROTONIX) 40 MG tablet   fluticasone (FLONASE) 50 MCG/ACT nasal spray    Meds ordered this encounter  Medications   pantoprazole (PROTONIX) 40 MG tablet    Sig: Take 1 tablet (40 mg  total) by mouth 2 (two) times daily.    Dispense:  60 tablet    Refill:  0    Order Specific Question:   Supervising Provider    Answer:   Milinda Antis MARNE A [[id number]]   fluticasone (FLONASE) 50 MCG/ACT nasal spray    Sig: Place 2 sprays into both nostrils daily.    Dispense:  16 g    Refill:  0    Order Specific Question:   Supervising Provider    Answer:   Roxy Manns A [[id number]]    Return in about 4 months (around 07/23/2023), or if symptoms worsen or fail to improve, for BP recheck.  Audria Nine, NP

## 2023-03-23 NOTE — Assessment & Plan Note (Signed)
Ambiguous in nature.  Patient does have COPD and heart failure will check basic labs inclusive of CBC, CMP, BNP, vitamin D, vitamin B12.

## 2023-03-23 NOTE — Assessment & Plan Note (Signed)
History of the same.  Patient is on Entresto.  Taking medication as prescribed.  Recently saw cardiology.  Given increasing fatigue we will check BNP.  Patient does have trace edema in his legs

## 2023-03-23 NOTE — Assessment & Plan Note (Signed)
Last TSH within normal limits patient still currently maintained on levothyroxine 200 mcg daily pending TSH today

## 2023-03-23 NOTE — Assessment & Plan Note (Signed)
Patient is having globus sensation but also breakthrough heartburn GERD symptoms.  Will increase Protonix from 40 mg to 40 mg twice daily for a month new prescription sent in.  Also placed patient on metformin Tikosyn nasal spray to make sure it is not related to postnasal

## 2023-03-23 NOTE — Assessment & Plan Note (Signed)
On Trelegy and albuterol inhaler as needed.  Patient states however right worse

## 2023-03-23 NOTE — Patient Instructions (Addendum)
Nice to see you today I will be in touch with the labs once I have them Follow up with me if it does not improve  Follow up in 4 months for your regular check up

## 2023-03-27 ENCOUNTER — Other Ambulatory Visit: Payer: Self-pay | Admitting: Nurse Practitioner

## 2023-03-27 ENCOUNTER — Telehealth: Payer: Self-pay | Admitting: Nurse Practitioner

## 2023-03-27 DIAGNOSIS — E559 Vitamin D deficiency, unspecified: Secondary | ICD-10-CM

## 2023-03-27 MED ORDER — VITAMIN D (ERGOCALCIFEROL) 1.25 MG (50000 UNIT) PO CAPS
50000.0000 [IU] | ORAL_CAPSULE | ORAL | 0 refills | Status: DC
Start: 2023-03-27 — End: 2023-10-11

## 2023-03-27 NOTE — Telephone Encounter (Signed)
We have discussed this and I sent in some nasal spray to see if that helps. Give it a week to see

## 2023-03-27 NOTE — Telephone Encounter (Signed)
-----   Message from Vertis Kelch, [place] sent at 03/27/2023  9:12 AM EDT ----- Called patient reviewed all information and repeated back to me. Will call if any questions.  Pt stated he still feels like he has something in his throat. He said it will not go up or down.

## 2023-03-28 NOTE — Telephone Encounter (Signed)
Called and informed pt of this information. He said he would call us after in about a week and let us know how it is going using the nose spray.

## 2023-04-20 ENCOUNTER — Other Ambulatory Visit: Payer: Self-pay | Admitting: Nurse Practitioner

## 2023-04-20 DIAGNOSIS — R09A2 Foreign body sensation, throat: Secondary | ICD-10-CM

## 2023-04-21 ENCOUNTER — Encounter: Payer: Self-pay | Admitting: Nurse Practitioner

## 2023-04-21 ENCOUNTER — Ambulatory Visit (INDEPENDENT_AMBULATORY_CARE_PROVIDER_SITE_OTHER): Payer: PPO | Admitting: Nurse Practitioner

## 2023-04-21 VITALS — BP 124/70 | HR 62 | Temp 98.2°F | Ht 72.0 in | Wt 257.0 lb

## 2023-04-21 DIAGNOSIS — J449 Chronic obstructive pulmonary disease, unspecified: Secondary | ICD-10-CM

## 2023-04-21 DIAGNOSIS — R09A2 Foreign body sensation, throat: Secondary | ICD-10-CM

## 2023-04-21 DIAGNOSIS — R0600 Dyspnea, unspecified: Secondary | ICD-10-CM | POA: Diagnosis not present

## 2023-04-21 MED ORDER — PREDNISONE 20 MG PO TABS
ORAL_TABLET | ORAL | 0 refills | Status: AC
Start: 2023-04-21 — End: 2023-04-27

## 2023-04-21 NOTE — Assessment & Plan Note (Signed)
Patient is followed by Dr. Vassie Loll. He is on trelegy and adherent. He is using albuterol inhaler several times a day. Will do prednisone taper

## 2023-04-21 NOTE — Progress Notes (Signed)
   Acute Office Visit  Subjective:     Patient ID: Arthur [name]., male    DOB: Aug 04, 1941, 82 y.o.   MRN: [id number]  Chief Complaint  Patient presents with   Dysphagia    Pt states of difficulty swallowing on and off for months. No pain when swallowing.    COPD     Patient is in today for swallowing trouble  Patient has seen me for this in the past. He does have a history of GERD and on protonix. He is using flonase also [place]   COPD: he is followed by pulmonary and on trelegy. States that he does have an albuterol inhaler and uses it daily up to several times a day   Review of Systems  Constitutional:  Negative for chills and fever.  HENT:         Globus sensation   Respiratory:  Positive for shortness of breath. Negative for wheezing.   Cardiovascular:  Negative for chest pain.  Neurological:  Negative for headaches.  Psychiatric/Behavioral:  Negative for hallucinations and suicidal ideas.         Objective:    BP 124/70   Pulse 62   Temp 98.2 F (36.8 C) (Temporal)   Ht 6' (1.829 m)   Wt 257 lb (116.6 kg)   SpO2 93%   BMI 34.86 kg/m    Physical Exam Vitals and nursing note reviewed.  Constitutional:      Appearance: Normal appearance.  Cardiovascular:     Rate and Rhythm: Normal rate and regular rhythm.     Heart sounds: Normal heart sounds.  Pulmonary:     Effort: Pulmonary effort is normal.     Breath sounds: Normal breath sounds.  Neurological:     Mental Status: He is alert.     No results found for any visits on 04/21/23.      Assessment & Plan:   Problem List Items Addressed This Visit       Respiratory   COPD (chronic obstructive pulmonary disease) (HCC)    Patient is followed by Dr. Vassie Loll. He is on trelegy and adherent. He is using albuterol inhaler several times a day. Will do prednisone taper      Relevant Medications   predniSONE (DELTASONE) 20 MG tablet     Other   Dyspnea   Relevant Medications   predniSONE  (DELTASONE) 20 MG tablet   Globus sensation - Primary    On high dose PPI and flonase nasal spray. No longer has a thyroid. Amb referral to gastro for further evaluation       Relevant Orders   Ambulatory referral to Gastroenterology    Meds ordered this encounter  Medications   predniSONE (DELTASONE) 20 MG tablet    Sig: Take 2 tablets (40 mg total) by mouth daily with breakfast for 3 days, THEN 1 tablet (20 mg total) daily with breakfast for 3 days.    Dispense:  9 tablet    Refill:  0    Order Specific Question:   Supervising Provider    Answer:   Roxy Manns A [[id number]]    Return if symptoms worsen or fail to improve, for As scheduled .  Audria Nine, NP

## 2023-04-21 NOTE — Patient Instructions (Signed)
Nice to see you today I have referred you to GI Take the steroids with food. DO not take NSAIDs with it  You will take 2 tablets by mouth for 3 days then 1 tablet by mouth for 3 days then stop

## 2023-04-21 NOTE — Assessment & Plan Note (Signed)
On high dose PPI and flonase nasal spray. No longer has a thyroid. Amb referral to gastro for further evaluation

## 2023-05-03 ENCOUNTER — Encounter: Payer: Self-pay | Admitting: Nurse Practitioner

## 2023-05-03 ENCOUNTER — Ambulatory Visit: Payer: PPO | Admitting: Nurse Practitioner

## 2023-05-03 VITALS — BP 106/70 | HR 62 | Ht 72.0 in | Wt 259.5 lb

## 2023-05-03 DIAGNOSIS — K219 Gastro-esophageal reflux disease without esophagitis: Secondary | ICD-10-CM

## 2023-05-03 MED ORDER — FAMOTIDINE 40 MG PO TABS: 40.0000 mg | ORAL_TABLET | Freq: Every day | ORAL | 5 refills | Status: DC
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
## 2023-05-03 NOTE — Patient Instructions (Addendum)
_______________________________________________________  If your blood pressure at your visit was 140/90 or greater, please contact your primary care physician to follow up on this.  _______________________________________________________  If you are age 81 or older, your body mass index should be between 23-30. Your Body mass index is 35.19 kg/m. If this is out of the aforementioned range listed, please consider follow up with your Primary Care Provider.  If you are age 49 or younger, your body mass index should be between 19-25. Your Body mass index is 35.19 kg/m. If this is out of the aformentioned range listed, please consider follow up with your Primary Care Provider.   ________________________________________________________  The [facility] providers would like to encourage you to use [facility] to communicate with providers for non-urgent requests or questions.  Due to long hold times on the telephone, sending your provider a message by [facility] may be a faster and more efficient way to get a response.  Please allow 48 business hours for a response.  Please remember that this is for non-urgent requests.   Acid Reflux  Below are some measures you can take to possibly improve acid reflux symptoms . We may have discussed some of these today in the office. Not everything on this list may apply to you   --If you are taking anti-reflux ( GERD) medication be sure to take it 30 minutes before breakfast and if taking twice daily then also second dose should be 30 minutes before dinner.   --Avoid late meals / bedtime snacks.  Do not eat anything for at 2 hours before laying down --Avoid trigger foods ( foods which you know tend to aggravate you reflux symptoms). Some common trigger foods include spicy foods, fatty foods, acidic foods, chocolate and caffeine.  --Elevate the head of bed 6-8 inches on blocks or bricks. If not able to elevate the head of the bed consider purchasing a wedge pillow to sleep  on.    --Weight reduction / maintain a healthy BMI ( body mass index) may be help with reflux symptoms   It was a pleasure to see you today!  Thank you for trusting me with your gastrointestinal care!

## 2023-05-08 ENCOUNTER — Other Ambulatory Visit: Payer: Self-pay

## 2023-05-08 MED ORDER — ENTRESTO 49-51 MG PO TABS: 1.0000 | ORAL_TABLET | Freq: Two times a day (BID) | ORAL | 2 refills | Status: DC

## 2023-05-12 ENCOUNTER — Other Ambulatory Visit: Payer: Self-pay | Admitting: Nurse Practitioner

## 2023-05-12 DIAGNOSIS — E039 Hypothyroidism, unspecified: Secondary | ICD-10-CM

## 2023-05-14 ENCOUNTER — Other Ambulatory Visit: Payer: Self-pay | Admitting: Nurse Practitioner

## 2023-05-14 DIAGNOSIS — R09A2 Foreign body sensation, throat: Secondary | ICD-10-CM

## 2023-05-15 DIAGNOSIS — H1131 Conjunctival hemorrhage, right eye: Secondary | ICD-10-CM | POA: Diagnosis not present

## 2023-05-15 DIAGNOSIS — Z6835 Body mass index (BMI) 35.0-35.9, adult: Secondary | ICD-10-CM | POA: Diagnosis not present

## 2023-05-15 DIAGNOSIS — I10 Essential (primary) hypertension: Secondary | ICD-10-CM | POA: Diagnosis not present

## 2023-05-15 DIAGNOSIS — D692 Other nonthrombocytopenic purpura: Secondary | ICD-10-CM | POA: Diagnosis not present

## 2023-05-15 DIAGNOSIS — J449 Chronic obstructive pulmonary disease, unspecified: Secondary | ICD-10-CM | POA: Diagnosis not present

## 2023-05-15 DIAGNOSIS — E785 Hyperlipidemia, unspecified: Secondary | ICD-10-CM | POA: Diagnosis not present

## 2023-05-17 DIAGNOSIS — H1131 Conjunctival hemorrhage, right eye: Secondary | ICD-10-CM | POA: Diagnosis not present

## 2023-06-07 ENCOUNTER — Other Ambulatory Visit: Payer: Self-pay | Admitting: Adult Health

## 2023-06-12 ENCOUNTER — Other Ambulatory Visit: Payer: Self-pay | Admitting: Nurse Practitioner

## 2023-06-12 DIAGNOSIS — R09A2 Foreign body sensation, throat: Secondary | ICD-10-CM

## 2023-06-16 ENCOUNTER — Ambulatory Visit: Payer: PPO | Admitting: Adult Health

## 2023-06-16 ENCOUNTER — Encounter: Payer: Self-pay | Admitting: Adult Health

## 2023-06-16 VITALS — BP 130/70 | HR 62 | Ht 72.0 in | Wt 266.8 lb

## 2023-06-16 DIAGNOSIS — I5042 Chronic combined systolic (congestive) and diastolic (congestive) heart failure: Secondary | ICD-10-CM

## 2023-06-16 DIAGNOSIS — Z23 Encounter for immunization: Secondary | ICD-10-CM

## 2023-06-16 DIAGNOSIS — R918 Other nonspecific abnormal finding of lung field: Secondary | ICD-10-CM

## 2023-06-16 DIAGNOSIS — J432 Centrilobular emphysema: Secondary | ICD-10-CM | POA: Diagnosis not present

## 2023-06-16 NOTE — Assessment & Plan Note (Signed)
Appears euvolemic on exam.  Continue follow-up with cardiology 

## 2023-06-16 NOTE — Progress Notes (Signed)
@Patient  ID: Arthur Drown., male    DOB: 12-Aug-1941, 82 y.o.   MRN: [id number]  Chief Complaint  Patient presents with   Follow-up    Referring provider: [name], NP  HPI: 82 year old male former smoker followed for COPD, abnormal CT chest with pulmonary nodularity Medical history significant for congestive heart failure. Patient is a Hydrographic surveyor  TEST/EVENTS :  2D Echo 05/2016 Moderate to severe global reduction in LV function; mild LVH;   grade 1 diastolic dysfunction with elevated LV filling pressure;   mild biatrial enlargement; trace AI, MR and TR. EF 30-35%   3/2022EF improved to 52%    Spirometry 2016 >FEV1 66% , ratio 63.    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 Treated with IV steroids and PO levaquin. Initially required O2 therapy but was weaned.   Echo 01/2022 EF 50%, Gr 1 DD , Nml PAP   CT chest December 15, 2022 showed scattered tree-in-bud nodular densities in the right upper and middle lobes, scattered pulmonary nodules measuring 4 mm.,  Emphysema, Serial CT chest planned in March 2025 .   06/16/2023 Follow up ; COPD, Lung nodularity Patient presents for a 68-month follow-up.  Patient is followed for moderate COPD.  Patient says overall breathing is doing okay.  He has had no recent flare of cough or wheezing.  No recent antibiotics or steroids.  Last hospitalization for COPD was in March 2023.  He remains on Trelegy inhaler daily.  Endorses compliance.  No increased albuterol use.  He remains very active.  With spirometry and DLCO.  CT chest December 15, 2022 showed scattered tree-in-bud nodular densities in the right upper and middle lobes, scattered pulmonary nodules  measuring 4 mm.,  Emphysema, Serial CT chest planned in March 2025 .   Following with GI for Dysphagia and GERD . Plans for upper endoscopy.   Works driving trucks for farms. Dances on Friday night.   Allergies  Allergen Reactions   Doxycycline Other (See Comments)    Esophagitis, severe gi bleed    Immunization History  Administered Date(s) Administered   Fluad Quad(high Dose 65+) 07/03/2019, 08/10/2020, 06/16/2022   Influenza Split 08/01/2011, 08/06/2012   Influenza, High Dose Seasonal PF 06/27/2016, 07/20/2018, 06/22/2021   Influenza,inj,Quad PF,6+ Mos 06/27/2013, 09/18/2014, 06/08/2015, 09/14/2017   Moderna Sars-Covid-2 Vaccination 12/23/2019, 01/06/2020   Pneumococcal Conjugate-13 10/20/2015   Pneumococcal Polysaccharide-23 01/10/2000, 10/26/2007   Tdap 04/26/2010   Zoster Recombinant(Shingrix) 05/10/2018, 07/20/2018    Past Medical History:  Diagnosis Date   Arthritis    Bifascicular block    a. 1st degree AVB/LBBB.   Cancer [facility])    thyroid and prostate   Cataract    Chronic systolic CHF (congestive heart failure) (HCC)    COPD (chronic obstructive pulmonary disease) (HCC)    ED (erectile dysfunction)    Essential hypertension, benign    chris guess  pcp   GERD (gastroesophageal reflux disease)    Hypercholesterolemia    Kidney stone    renal stone   LBBB (left bundle branch block) 10/2007   Migraines    Mild dietary indigestion    NICM (nonischemic cardiomyopathy) (HCC)    Nonischemic cardiomyopathy (HCC)    Prostate cancer (HCC)  Reflux esophagitis    Reflux esophagitis    Shortness of breath dyspnea    W/ EXERTION    Spider bite    Thyroid cancer (HCC)    Tobacco use disorder    Unspecified hypothyroidism     Tobacco History: Social History   Tobacco Use  Smoking Status Former   Current packs/day: 0.00   Average packs/day: 0.1 packs/day for 52.0 years (5.2 ttl pk-yrs)   Types: Cigarettes   Start date: 04/04/1964   Quit date: 04/04/2016    Years since quitting: 7.2  Smokeless Tobacco Never  Tobacco Comments   Smoked 2 cigs this weekend after 2-3 months    Counseling given: Not Answered Tobacco comments: Smoked 2 cigs this weekend after 2-3 months    Outpatient Medications Prior to Visit  Medication Sig Dispense Refill   albuterol (VENTOLIN HFA) 108 (90 Base) MCG/ACT inhaler INHALE 1 TO 2 PUFFS INTO THE LUNGS EVERY 6 HOURS AS NEEDED FOR WHEEZING OR SHORTNESS OF BREATH 18 g 3   atorvastatin (LIPITOR) 40 MG tablet TAKE 1 TABLET(40 MG) BY MOUTH DAILY 90 tablet 2   carvedilol (COREG) 6.25 MG tablet TAKE 1 TABLET(6.25 MG) BY MOUTH TWICE DAILY WITH A MEAL 180 tablet 3   famotidine (PEPCID) 40 MG tablet Take 1 tablet (40 mg total) by mouth at bedtime. 30 tablet 5   fluticasone (FLONASE) 50 MCG/ACT nasal spray Place 2 sprays into both nostrils daily. 16 g 0   Fluticasone-Umeclidin-Vilant (TRELEGY ELLIPTA) 100-62.5-25 MCG/ACT AEPB Inhale 1 puff into the lungs daily. 60 each 5   levothyroxine (SYNTHROID) 200 MCG tablet TAKE 1 TABLET(200 MCG) BY MOUTH DAILY BEFORE BREAKFAST 90 tablet 1   Nutritional Supplements (GLUCOSAMINE COMPLEX PO) Take by mouth.     pantoprazole (PROTONIX) 40 MG tablet TAKE 1 TABLET(40 MG) BY MOUTH TWICE DAILY 60 tablet 0   sacubitril-valsartan (ENTRESTO) 49-51 MG Take 1 tablet by mouth 2 (two) times daily. 180 tablet 2   vitamin B-12 (CYANOCOBALAMIN) 1000 MCG tablet Take 1,000 mcg by mouth. Once a week     Vitamin D, Ergocalciferol, (DRISDOL) 1.25 MG (50000 UNIT) CAPS capsule Take 1 capsule (50,000 Units total) by mouth every 7 (seven) days. 12 capsule 0   Facility-Administered Medications Prior to Visit  Medication Dose Route Frequency Provider Last Rate Last Admin   sodium chloride flush (NS) 0.9 % injection 3 mL  3 mL Intravenous Q12H Wendall Stade, MD         Review of Systems:   Constitutional:   No  weight loss, night sweats,  Fevers, chills,  +fatigue, or  lassitude.  HEENT:   No headaches,   Difficulty swallowing,  Tooth/dental problems, or  Sore throat,                No sneezing, itching, ear ache, nasal congestion, post nasal drip,   CV:  No chest pain,  Orthopnea, PND, swelling in lower extremities, anasarca, dizziness, palpitations, syncope.   GI  No abdominal pain, nausea, vomiting, diarrhea, change in bowel habits, loss of appetite, bloody stools.   Resp: No chest wall deformity  Skin: no rash or lesions.  GU: no dysuria, change in color of urine, no urgency or frequency.  No flank pain, no hematuria   MS:  No joint pain or swelling.  No decreased range of motion.  No back pain.    Physical Exam  BP 130/70 (BP Location: Left Arm, Cuff Size: Large)   Pulse 62   Ht 6' (  1.829 m)   Wt 266 lb 12.8 oz (121 kg)   SpO2 97%   BMI 36.18 kg/m   GEN: A/Ox3; pleasant , NAD, well nourished    HEENT:  [place]/AT,  NOSE-clear, THROAT-clear, no lesions, no postnasal drip or exudate noted.   NECK:  Supple w/ fair ROM; no JVD; normal carotid impulses w/o bruits; no thyromegaly or nodules palpated; no lymphadenopathy.    RESP  Clear  P & A; w/o, wheezes/ rales/ or rhonchi. no accessory muscle use, no dullness to percussion  CARD:  RRR, no m/r/g, no peripheral edema, pulses intact, no cyanosis or clubbing.  GI:   Soft & nt; nml bowel sounds; no organomegaly or masses detected.   Musco: Warm bil, no deformities or joint swelling noted.   Neuro: alert, no focal deficits noted.    Skin: Warm, no lesions or rashes    Lab Results:  CBC   BNP    Imaging: No results found.  Administration History     None           No data to display          No results found for: "NITRICOXIDE"      Assessment & Plan:   COPD (chronic obstructive pulmonary disease) (HCC) Moderate COPD -controlled on Trelegy . Albuterol inhaler As needed   Flu shot today   Plan  Patient Instructions  Continue on Trelegy 1 puff daily.  Activity as tolerated.  Work on healthy  weight loss.  Flu shot today .  CT chest as planned with Cardiology.  Follow up Dr. Vassie Loll  In 6 months and As needed   Please contact office for sooner follow up if symptoms do not improve or worsen or seek emergency care     Chronic combined systolic and diastolic CHF (congestive heart failure) (HCC) Appears euvolemic on exam  Continue follow up with cardiology  Multiple lung nodules on CT Stable on CT chest - CT chest in March 2025 as planned      Rubye Oaks, NP 06/16/2023

## 2023-06-16 NOTE — Assessment & Plan Note (Signed)
Stable on CT chest - CT chest in March 2025 as planned

## 2023-06-16 NOTE — Addendum Note (Signed)
Addended by: Carnella Guadalajara on: 06/16/2023 09:12 AM   Modules accepted: Orders

## 2023-06-16 NOTE — Assessment & Plan Note (Signed)
Moderate COPD -controlled on Trelegy . Albuterol inhaler As needed   Flu shot today   Plan  Patient Instructions  Continue on Trelegy 1 puff daily.  Activity as tolerated.  Work on healthy weight loss.  Flu shot today .  CT chest as planned with Cardiology.  Follow up Dr. Vassie Loll  In 6 months and As needed   Please contact office for sooner follow up if symptoms do not improve or worsen or seek emergency care

## 2023-06-16 NOTE — Patient Instructions (Addendum)
Continue on Trelegy 1 puff daily.  Activity as tolerated.  Work on healthy weight loss.  Flu shot today .  CT chest as planned with Cardiology.  Follow up Dr. Vassie Loll  In 6 months and As needed   Please contact office for sooner follow up if symptoms do not improve or worsen or seek emergency care

## 2023-06-17 ENCOUNTER — Other Ambulatory Visit: Payer: Self-pay | Admitting: Nurse Practitioner

## 2023-06-17 DIAGNOSIS — E559 Vitamin D deficiency, unspecified: Secondary | ICD-10-CM

## 2023-06-26 ENCOUNTER — Other Ambulatory Visit (HOSPITAL_BASED_OUTPATIENT_CLINIC_OR_DEPARTMENT_OTHER): Payer: Self-pay | Admitting: Pulmonary Disease

## 2023-06-26 DIAGNOSIS — H04123 Dry eye syndrome of bilateral lacrimal glands: Secondary | ICD-10-CM | POA: Diagnosis not present

## 2023-06-26 DIAGNOSIS — Z961 Presence of intraocular lens: Secondary | ICD-10-CM | POA: Diagnosis not present

## 2023-06-29 ENCOUNTER — Telehealth: Payer: Self-pay | Admitting: Adult Health

## 2023-06-29 NOTE — Telephone Encounter (Signed)
RX sent Monday morning. PT at [facility] counter and they are calling to see why taking so long. Please see if we have samples or submit his RX. His # is [phone]

## 2023-06-29 NOTE — Telephone Encounter (Signed)
Lm for patient to verify Rx name.

## 2023-07-03 NOTE — Telephone Encounter (Signed)
Trelegy was sent to pharmacy 06/29/23 RA. Patient aware.

## 2023-07-16 ENCOUNTER — Other Ambulatory Visit: Payer: Self-pay | Admitting: Nurse Practitioner

## 2023-07-16 DIAGNOSIS — R09A2 Foreign body sensation, throat: Secondary | ICD-10-CM

## 2023-07-24 ENCOUNTER — Encounter: Payer: Self-pay | Admitting: Nurse Practitioner

## 2023-07-24 ENCOUNTER — Ambulatory Visit (INDEPENDENT_AMBULATORY_CARE_PROVIDER_SITE_OTHER): Payer: PPO | Admitting: Nurse Practitioner

## 2023-07-24 VITALS — BP 124/78 | HR 64 | Temp 98.3°F | Ht 72.0 in | Wt 260.4 lb

## 2023-07-24 DIAGNOSIS — I5032 Chronic diastolic (congestive) heart failure: Secondary | ICD-10-CM | POA: Diagnosis not present

## 2023-07-24 DIAGNOSIS — R09A2 Foreign body sensation, throat: Secondary | ICD-10-CM | POA: Diagnosis not present

## 2023-07-24 DIAGNOSIS — E559 Vitamin D deficiency, unspecified: Secondary | ICD-10-CM

## 2023-07-24 DIAGNOSIS — J449 Chronic obstructive pulmonary disease, unspecified: Secondary | ICD-10-CM | POA: Diagnosis not present

## 2023-07-24 DIAGNOSIS — I10 Essential (primary) hypertension: Secondary | ICD-10-CM

## 2023-07-24 LAB — CBC
HCT: 39.7 % (ref 39.0–52.0)
Hemoglobin: 12.5 g/dL — ABNORMAL LOW (ref 13.0–17.0)
MCHC: 31.5 g/dL (ref 30.0–36.0)
MCV: 90.6 fL (ref 78.0–100.0)
Platelets: 187 10*3/uL (ref 150.0–400.0)
RBC: 4.38 Mil/uL (ref 4.22–5.81)
RDW: 14.5 % (ref 11.5–15.5)
WBC: 9 10*3/uL (ref 4.0–10.5)

## 2023-07-24 LAB — BASIC METABOLIC PANEL
BUN: 19 mg/dL (ref 6–23)
CO2: 33 meq/L — ABNORMAL HIGH (ref 19–32)
Calcium: 9 mg/dL (ref 8.4–10.5)
Chloride: 103 meq/L (ref 96–112)
Creatinine, Ser: 1.06 mg/dL (ref 0.40–1.50)
GFR: 66.05 mL/min (ref >60.00)
Glucose, Bld: 124 mg/dL — ABNORMAL HIGH (ref 70–99)
Potassium: 3.9 meq/L (ref 3.5–5.1)
Sodium: 143 meq/L (ref 135–145)

## 2023-07-24 LAB — VITAMIN D 25 HYDROXY (VIT D DEFICIENCY, FRACTURES): VITD: 27.79 ng/mL — ABNORMAL LOW (ref 30.00–100.00)

## 2023-07-24 NOTE — Progress Notes (Signed)
Established Patient Office Visit  Subjective   Patient ID: Arthur Ord., male    DOB: [birth year]  Age: 82 y.o. MRN: [id number]  Chief Complaint  Patient presents with   Follow-up    HPI  Emphysema: patient is followed by pulmonology and last seen on 06/16/2023. Maintained on trelegy and albuterol. Has CT scan of chest planned for 12/2023. States that he will walk 0.5 mile a day and having some dyspna on exertion.  Patient states the more he is doing as the easier is becoming  CHF: he is currently followed by cardiology. Last seen on 01/12/2023. On entresto, carvedilol. Last echo was 01/01/2022. Last heart cath was 03/07/2022. States that he will be 260-270 depending on the clothes.  He is followed by cardiology on a yearly basis.  Globus sensation: Patient was seen at the GI clinic on 05/03/2023.  At that juncture I continued patient on twice daily PPI add on 20 mg of famotidine.  He supposed to follow-up with the physician in October. He has that scheduled on 07/26/2023. States that he does have some reflux and he will eat 430-5 and will lay down at 6 and go to sleep around 11.     Review of Systems  Constitutional:  Negative for chills and fever.  Respiratory:  Positive for shortness of breath.   Cardiovascular:  Negative for chest pain.  Gastrointestinal:        BM daily   Genitourinary:  Positive for frequency.  Neurological:  Negative for dizziness and headaches.      Objective:     BP 124/78   Pulse 64   Temp 98.3 F (36.8 C) (Oral)   Ht 6' (1.829 m)   Wt 260 lb 6.4 oz (118.1 kg)   SpO2 94%   BMI 35.32 kg/m  BP Readings from Last 3 Encounters:  07/24/23 124/78  06/16/23 130/70  05/03/23 106/70   Wt Readings from Last 3 Encounters:  07/24/23 260 lb 6.4 oz (118.1 kg)  06/16/23 266 lb 12.8 oz (121 kg)  05/03/23 259 lb 8 oz (117.7 kg)      Physical Exam Vitals and nursing note reviewed.  Constitutional:      Appearance: Normal appearance.   Cardiovascular:     Rate and Rhythm: Normal rate and regular rhythm.     Heart sounds: Normal heart sounds.  Pulmonary:     Effort: Pulmonary effort is normal.     Breath sounds: Normal breath sounds.  Abdominal:     General: Bowel sounds are normal.  Neurological:     Mental Status: He is alert.      No results found for any visits on 07/24/23.    The ASCVD Risk score (Arnett DK, et al., 2019) failed to calculate for the following reasons:   The 2019 ASCVD risk score is only valid for ages 47 to 39    Assessment & Plan:   Problem List Items Addressed This Visit       Cardiovascular and Mediastinum   Essential hypertension - Primary    Patient currently maintained on carvedilol 6.25 mg twice daily.  Tolerate medication well.  Blood pressure well-controlled.  Continue medication as prescribed      Relevant Orders   CBC   Basic metabolic panel   (HFpEF) heart failure with preserved ejection fraction [facility])    Patient is followed by cardiology he is on a beta-blocker and Entresto.  Continue taking medications prescribed by the specialist as recommended  Relevant Orders   CBC   Basic metabolic panel     Respiratory   COPD (chronic obstructive pulmonary disease) [facility])    Patient currently following with pulmonology on Trelegy.  Stable at this juncture continue taking inhalers as prescribed and follow-up with specialist as recommended      Relevant Orders   CBC     Other   Globus sensation    Has been seen by GI currently maintained on pantoprazole 40 mg twice daily and famotidine 40 mg nightly.  Patient still having some breakthrough symptoms.  He is supposed to see GI physician tomorrow for further evaluation.  Continue taking medications as prescribed follow-up with a GI physician as recommended and scheduled      Other Visit Diagnoses     Vitamin D deficiency       Relevant Orders   VITAMIN D 25 Hydroxy (Vit-D Deficiency, Fractures)       Return in  about 6 months (around 01/22/2024) for CPE and Labs.    Audria Nine, NP

## 2023-07-24 NOTE — Assessment & Plan Note (Signed)
Patient currently maintained on carvedilol 6.25 mg twice daily.  Tolerate medication well.  Blood pressure well-controlled.  Continue medication as prescribed

## 2023-07-24 NOTE — Assessment & Plan Note (Signed)
Patient is followed by cardiology he is on a beta-blocker and Entresto.  Continue taking medications prescribed by the specialist as recommended

## 2023-07-24 NOTE — Assessment & Plan Note (Signed)
Patient currently following with pulmonology on Trelegy.  Stable at this juncture continue taking inhalers as prescribed and follow-up with specialist as recommended

## 2023-07-24 NOTE — Assessment & Plan Note (Signed)
Has been seen by GI currently maintained on pantoprazole 40 mg twice daily and famotidine 40 mg nightly.  Patient still having some breakthrough symptoms.  He is supposed to see GI physician tomorrow for further evaluation.  Continue taking medications as prescribed follow-up with a GI physician as recommended and scheduled

## 2023-07-24 NOTE — Patient Instructions (Signed)
Nice to see you today I want to see you in 6 months, sooner if you need me This will be your physical

## 2023-07-26 ENCOUNTER — Ambulatory Visit: Payer: PPO | Admitting: Internal Medicine

## 2023-07-26 ENCOUNTER — Encounter: Payer: Self-pay | Admitting: Internal Medicine

## 2023-07-26 VITALS — BP 140/80 | HR 61 | Ht 72.0 in | Wt 260.0 lb

## 2023-07-26 DIAGNOSIS — K219 Gastro-esophageal reflux disease without esophagitis: Secondary | ICD-10-CM | POA: Diagnosis not present

## 2023-07-26 DIAGNOSIS — R490 Dysphonia: Secondary | ICD-10-CM

## 2023-07-26 NOTE — Patient Instructions (Signed)
You have been scheduled for a Barium Esophogram at [facility] (1st floor of the hospital) on 08/01/2023 at 10:00am. Please arrive 30 minutes prior to your appointment for registration. Make certain not to have anything to eat or drink 3 hours prior to your test. If you need to reschedule for any reason, please contact radiology at [phone] to do so. __________________________________________________________________ A barium swallow is an examination that concentrates on views of the esophagus. This tends to be a double contrast exam (barium and two liquids which, when combined, create a gas to distend the wall of the oesophagus) or single contrast (non-ionic iodine based). The study is usually tailored to your symptoms so a good history is essential. Attention is paid during the study to the form, structure and configuration of the esophagus, looking for functional disorders (such as aspiration, dysphagia, achalasia, motility and reflux) EXAMINATION You may be asked to change into a gown, depending on the type of swallow being performed. A radiologist and radiographer will perform the procedure. The radiologist will advise you of the type of contrast selected for your procedure and direct you during the exam. You will be asked to stand, sit or lie in several different positions and to hold a small amount of fluid in your mouth before being asked to swallow while the imaging is performed .In some instances you may be asked to swallow barium coated marshmallows to assess the motility of a solid food bolus. The exam can be recorded as a digital or video fluoroscopy procedure. POST PROCEDURE It will take 1-2 days for the barium to pass through your system. To facilitate this, it is important, unless otherwise directed, to increase your fluids for the next 24-48hrs and to resume your normal diet.  This test typically takes about 30 minutes to  perform. __________________________________________________________________________________   We have placed a referral to Dr Ashok Croon with [facility]. They are located at : [address]  [address]  [place] [address]. They will contact you about setting up an appointment.   I appreciate the opportunity to care for you. Stan Head, MD, [facility]
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
## 2023-08-01 ENCOUNTER — Ambulatory Visit (HOSPITAL_COMMUNITY)
Admission: RE | Admit: 2023-08-01 | Discharge: 2023-08-01 | Disposition: A | Discharge status: Home / Self Care | Payer: PPO | Source: Ambulatory Visit | Attending: Internal Medicine | Admitting: Internal Medicine

## 2023-08-01 ENCOUNTER — Telehealth: Payer: Self-pay | Admitting: Internal Medicine

## 2023-08-01 DIAGNOSIS — K449 Diaphragmatic hernia without obstruction or gangrene: Secondary | ICD-10-CM | POA: Diagnosis not present

## 2023-08-01 DIAGNOSIS — R490 Dysphonia: Secondary | ICD-10-CM | POA: Diagnosis not present

## 2023-08-01 DIAGNOSIS — R1319 Other dysphagia: Secondary | ICD-10-CM

## 2023-08-01 DIAGNOSIS — K219 Gastro-esophageal reflux disease without esophagitis: Secondary | ICD-10-CM | POA: Insufficient documentation

## 2023-08-01 DIAGNOSIS — Z981 Arthrodesis status: Secondary | ICD-10-CM | POA: Diagnosis not present

## 2023-08-01 DIAGNOSIS — R933 Abnormal findings on diagnostic imaging of other parts of digestive tract: Secondary | ICD-10-CM

## 2023-08-01 NOTE — Telephone Encounter (Signed)
Barium swallow reviewed with the patient the 13 mm tablet lodged in the cervical esophagus.  No clear stricture but upon further questioning he has some mild dysphagia at times.  I am recommending an EGD with possible esophageal dilation.  I reviewed the risks benefits and indications with the patient.   He is ejection fraction is 50% on echocardiogram last year, there is no history of difficult intubation and he is not on oxygen therapy.  I believe he is an appropriate candidate for the [facility].   Please call him and arrange an EGD with possible esophageal dilation.  Encounter Diagnoses  Name Primary?   Esophageal dysphagia Yes   Abnormal barium swallow

## 2023-08-03 ENCOUNTER — Encounter (INDEPENDENT_AMBULATORY_CARE_PROVIDER_SITE_OTHER): Payer: Self-pay | Admitting: Otolaryngology

## 2023-08-03 ENCOUNTER — Other Ambulatory Visit: Payer: Self-pay

## 2023-08-03 DIAGNOSIS — R933 Abnormal findings on diagnostic imaging of other parts of digestive tract: Secondary | ICD-10-CM

## 2023-08-03 DIAGNOSIS — R1319 Other dysphagia: Secondary | ICD-10-CM

## 2023-08-03 NOTE — Telephone Encounter (Signed)
Scheduled EGD w/dilation with patient for 08/10/23 at 3:00 pm in the [facility] with Dr. Leone Payor. Amb ref placed. Pt denies blood thinners/diabetic medications. He has been advised of instructions & verbalized all understanding. No further questions.

## 2023-08-10 ENCOUNTER — Ambulatory Visit: Payer: PPO | Admitting: Internal Medicine

## 2023-08-10 ENCOUNTER — Encounter: Payer: Self-pay | Admitting: Internal Medicine

## 2023-08-10 VITALS — BP 143/79 | HR 68 | Temp 98.6°F | Resp 15 | Ht 72.0 in | Wt 260.0 lb

## 2023-08-10 DIAGNOSIS — I509 Heart failure, unspecified: Secondary | ICD-10-CM | POA: Diagnosis not present

## 2023-08-10 DIAGNOSIS — R1319 Other dysphagia: Secondary | ICD-10-CM

## 2023-08-10 DIAGNOSIS — R1310 Dysphagia, unspecified: Secondary | ICD-10-CM | POA: Diagnosis not present

## 2023-08-10 MED ORDER — SODIUM CHLORIDE 0.9 % IV SOLN: 500.0000 mL | INTRAVENOUS | Status: DC

## 2023-08-10 NOTE — Progress Notes (Signed)
Sedate, gd SR, tolerated procedure well, VSS, report to RN 

## 2023-08-10 NOTE — Progress Notes (Signed)
Called to room to assist during endoscopic procedure.  Patient ID and intended procedure confirmed with present staff. Received instructions for my participation in the procedure from the performing physician.  

## 2023-08-10 NOTE — Progress Notes (Signed)
History and Physical Interval Note:  08/10/2023 3:03 PM  Arthur [name].  has presented today for endoscopic procedure(s), with the diagnosis of  Encounter Diagnosis  Name Primary?   Hoarseness Yes  .  The various methods of evaluation and treatment have been discussed with the patient and/or family. After consideration of risks, benefits and other options for treatment, the patient has consented to  the endoscopic procedure(s).  Ba swallow showed dysmotility and tablet delay at UES and he also c/o dysphagia after reviewing that study after recent office visit.   The patient's history has been reviewed, patient examined, no change in status, stable for endoscopic procedure(s).  I have reviewed the patient's chart and labs.  Questions were answered to the patient's satisfaction.     Iva Boop, MD, Clementeen Graham

## 2023-08-10 NOTE — Op Note (Addendum)
[facility] Patient Name: Arthur [name] Procedure Date: 08/10/2023 3:13 PM MRN: [id number] Endoscopist: Iva Boop , MD, [id number] Age: 82 Referring MD:  Date of Birth: 26-Dec-1940 Gender: Male Account #: [id number] Procedure:                Upper GI endoscopy Indications:              Dysphagia and Hoarseness - main reason was                            dysphagia and 13 mm barium tablet was transiently                            lodged at upper esophagus on ba swallow that also                            indicated dysmotility Medicines:                Monitored Anesthesia Care Procedure:                Pre-Anesthesia Assessment:                           - Prior to the procedure, a History and Physical                            was performed, and patient medications and                            allergies were reviewed. The patient's tolerance of                            previous anesthesia was also reviewed. The risks                            and benefits of the procedure and the sedation                            options and risks were discussed with the patient.                            All questions were answered, and informed consent                            was obtained. Prior Anticoagulants: The patient has                            taken no anticoagulant or antiplatelet agents. ASA                            Grade Assessment: III - A patient with severe                            systemic disease. After reviewing the risks and  benefits, the patient was deemed in satisfactory                            condition to undergo the procedure.                           After obtaining informed consent, the endoscope was                            passed under direct vision. Throughout the                            procedure, the patient's blood pressure, pulse, and                            oxygen saturations were monitored  continuously. The                            GIF [id number] #[id number] was introduced through the                            mouth, and advanced to the second part of duodenum.                            The upper GI endoscopy was accomplished without                            difficulty. The patient tolerated the procedure                            well. Scope In: Scope Out: Findings:                 The examined esophagus was moderately tortuous.                           The Z-line was irregular and was found at the                            gastroesophageal junction.                           The exam was otherwise without abnormality.                           The cardia and gastric fundus were normal on                            retroflexion.                           The scope was withdrawn. Dilation was performed in                            the entire esophagus with a Maloney dilator with no  resistance at 54 Fr. Estimated blood loss: none.                            Reinspection - no change. Complications:            No immediate complications. Estimated Blood Loss:     Estimated blood loss: none. Impression:               - Tortuous esophagus.                           - Z-line irregular, at the gastroesophageal                            junction.                           - The examination was otherwise normal.                           - Dilation performed in the entire esophagus. 54 Fr                            Maloney                           - No specimens collected. Recommendation:           - Patient has a contact number available for                            emergencies. The signs and symptoms of potential                            delayed complications were discussed with the                            patient. Return to normal activities tomorrow.                            Written discharge instructions were provided to the                             patient.                           - Clear liquids x 1 hour then soft foods rest of                            day. Start prior diet tomorrow.                           - Continue present medications.                           - Follow through with ENT evaluation re:  hoarseness. The arytenoids and mucosa surrounding                            vocal cords did look edematous. Iva Boop, MD 08/10/2023 3:33:12 PM This report has been signed electronically.

## 2023-08-10 NOTE — Progress Notes (Signed)
Patient states there have been no changes to medical or surgical history since time of pre-visit. 

## 2023-08-10 NOTE — Patient Instructions (Addendum)
I saw evidence for esophageal dysmotility (spasm). I dilated the esophagus to see if that would help you swallow better.  There was some swelling around the vocal cord area - please follow through with ENT appointment.  I appreciate the opportunity to care for you. Iva Boop, MD, FACG   YOU HAD AN ENDOSCOPIC PROCEDURE TODAY AT THE [facility]:   Refer to the procedure report that was given to you for any specific questions about what was found during the examination.  If the procedure report does not answer your questions, please call your gastroenterologist to clarify.  If you requested that your care partner not be given the details of your procedure findings, then the procedure report has been included in a sealed envelope for you to review at your convenience later.  YOU SHOULD EXPECT: Some feelings of bloating in the abdomen. Passage of more gas than usual.  Walking can help get rid of the air that was put into your GI tract during the procedure and reduce the bloating. If you had a lower endoscopy (such as a colonoscopy or flexible sigmoidoscopy) you may notice spotting of blood in your stool or on the toilet paper. If you underwent a bowel prep for your procedure, you may not have a normal bowel movement for a few days.  Please Note:  You might notice some irritation and congestion in your nose or some drainage.  This is from the oxygen used during your procedure.  There is no need for concern and it should clear up in a day or so.  SYMPTOMS TO REPORT IMMEDIATELY:  Following upper endoscopy (EGD)  Vomiting of blood or coffee ground material  New chest pain or pain under the shoulder blades  Painful or persistently difficult swallowing  New shortness of breath  Fever of 100F or higher  Black, tarry-looking stools  For urgent or emergent issues, a gastroenterologist can be reached at any hour by calling [phone]. Do not use MyChart messaging for urgent  concerns.    DIET:  We do recommend a small meal at first, but then you may proceed to your regular diet.  Drink plenty of fluids but you should avoid alcoholic beverages for 24 hours.  ACTIVITY:  You should plan to take it easy for the rest of today and you should NOT DRIVE or use heavy machinery until tomorrow (because of the sedation medicines used during the test).    FOLLOW UP: Our staff will call the number listed on your records the next business day following your procedure.  We will call around 7:15- 8:00 am to check on you and address any questions or concerns that you may have regarding the information given to you following your procedure. If we do not reach you, we will leave a message.     If any biopsies were taken you will be contacted by phone or by letter within the next 1-3 weeks.  Please call us at [phone] if you have not heard about the biopsies in 3 weeks.    SIGNATURES/CONFIDENTIALITY: You and/or your care partner have signed paperwork which will be entered into your electronic medical record.  These signatures attest to the fact that that the information above on your After Visit Summary has been reviewed and is understood.  Full responsibility of the confidentiality of this discharge information lies with you and/or your care-partner.

## 2023-08-11 ENCOUNTER — Telehealth: Payer: Self-pay

## 2023-08-11 NOTE — Telephone Encounter (Signed)
  Follow up Call-     08/10/2023    2:16 PM  Call back number  Post procedure Call Back phone  # [phone]  Permission to leave phone message Yes     Patient questions:  Do you have a fever, pain , or abdominal swelling? No. Pain Score  0 *  Have you tolerated food without any problems? Yes.    Have you been able to return to your normal activities? Yes.    Do you have any questions about your discharge instructions: Diet   No. Medications  No. Follow up visit  No.  Do you have questions or concerns about your Care? No.  Actions: * If pain score is 4 or above: No action needed, pain <4.

## 2023-08-15 ENCOUNTER — Other Ambulatory Visit: Payer: Self-pay | Admitting: Nurse Practitioner

## 2023-08-15 DIAGNOSIS — R09A2 Foreign body sensation, throat: Secondary | ICD-10-CM

## 2023-08-21 ENCOUNTER — Encounter (INDEPENDENT_AMBULATORY_CARE_PROVIDER_SITE_OTHER): Payer: Self-pay | Admitting: Otolaryngology

## 2023-08-21 ENCOUNTER — Ambulatory Visit (INDEPENDENT_AMBULATORY_CARE_PROVIDER_SITE_OTHER): Payer: PPO | Admitting: Otolaryngology

## 2023-08-21 VITALS — BP 156/80 | HR 70 | Ht 72.0 in

## 2023-08-21 DIAGNOSIS — J370 Chronic laryngitis: Secondary | ICD-10-CM

## 2023-08-21 DIAGNOSIS — J383 Other diseases of vocal cords: Secondary | ICD-10-CM | POA: Diagnosis not present

## 2023-08-21 DIAGNOSIS — J387 Other diseases of larynx: Secondary | ICD-10-CM

## 2023-08-21 DIAGNOSIS — R1310 Dysphagia, unspecified: Secondary | ICD-10-CM | POA: Diagnosis not present

## 2023-08-21 DIAGNOSIS — J3089 Other allergic rhinitis: Secondary | ICD-10-CM

## 2023-08-21 DIAGNOSIS — H903 Sensorineural hearing loss, bilateral: Secondary | ICD-10-CM | POA: Diagnosis not present

## 2023-08-21 DIAGNOSIS — R0982 Postnasal drip: Secondary | ICD-10-CM | POA: Diagnosis not present

## 2023-08-21 DIAGNOSIS — J381 Polyp of vocal cord and larynx: Secondary | ICD-10-CM | POA: Diagnosis not present

## 2023-08-21 DIAGNOSIS — R0981 Nasal congestion: Secondary | ICD-10-CM | POA: Diagnosis not present

## 2023-08-21 DIAGNOSIS — K219 Gastro-esophageal reflux disease without esophagitis: Secondary | ICD-10-CM | POA: Diagnosis not present

## 2023-08-21 DIAGNOSIS — R490 Dysphonia: Secondary | ICD-10-CM

## 2023-08-21 MED ORDER — CETIRIZINE HCL 10 MG PO TABS: 10.0000 mg | ORAL_TABLET | Freq: Every day | ORAL | 11 refills | Status: AC

## 2023-08-21 MED ORDER — FLUCONAZOLE 100 MG PO TABS: 100.0000 mg | ORAL_TABLET | Freq: Every day | ORAL | 0 refills | Status: DC

## 2023-08-21 MED ORDER — METHYLPREDNISOLONE 4 MG PO TBPK: ORAL_TABLET | ORAL | 1 refills | Status: DC

## 2023-08-21 MED ORDER — SULFAMETHOXAZOLE-TRIMETHOPRIM 800-160 MG PO TABS: 1.0000 | ORAL_TABLET | Freq: Two times a day (BID) | ORAL | 0 refills | Status: DC

## 2023-08-21 MED ORDER — FLUTICASONE PROPIONATE 50 MCG/ACT NA SUSP: 2.0000 | Freq: Two times a day (BID) | NASAL | 6 refills | Status: DC

## 2023-08-21 NOTE — Patient Instructions (Addendum)
-   continue Protonix and Pepcid for reflux  - Take Reflux Gourmet (natural supplement available on [employer]) to help with symptoms of chronic throat irritation  - take Medrol Pack, Bactrim and Diflucan x 2 weeks - hold Lipitor while on Diflucan - start Zyrtec and Flonase for post-nasal drainage - come back for repeat scope exam 6-8 weeks - we will decide if you need biopsy based on repeat scope exam
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
## 2023-08-23 ENCOUNTER — Other Ambulatory Visit: Payer: Self-pay | Admitting: Nurse Practitioner

## 2023-08-23 DIAGNOSIS — E559 Vitamin D deficiency, unspecified: Secondary | ICD-10-CM

## 2023-10-02 ENCOUNTER — Ambulatory Visit (INDEPENDENT_AMBULATORY_CARE_PROVIDER_SITE_OTHER): Payer: PPO | Admitting: Otolaryngology

## 2023-10-02 ENCOUNTER — Encounter (INDEPENDENT_AMBULATORY_CARE_PROVIDER_SITE_OTHER): Payer: Self-pay | Admitting: Otolaryngology

## 2023-10-02 VITALS — BP 156/76 | HR 70

## 2023-10-02 DIAGNOSIS — K219 Gastro-esophageal reflux disease without esophagitis: Secondary | ICD-10-CM | POA: Diagnosis not present

## 2023-10-02 DIAGNOSIS — J383 Other diseases of vocal cords: Secondary | ICD-10-CM | POA: Diagnosis not present

## 2023-10-02 DIAGNOSIS — J370 Chronic laryngitis: Secondary | ICD-10-CM | POA: Diagnosis not present

## 2023-10-02 DIAGNOSIS — J3089 Other allergic rhinitis: Secondary | ICD-10-CM

## 2023-10-02 DIAGNOSIS — R0982 Postnasal drip: Secondary | ICD-10-CM | POA: Diagnosis not present

## 2023-10-02 DIAGNOSIS — R490 Dysphonia: Secondary | ICD-10-CM

## 2023-10-02 DIAGNOSIS — J381 Polyp of vocal cord and larynx: Secondary | ICD-10-CM | POA: Diagnosis not present
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
## 2023-10-11 ENCOUNTER — Encounter: Payer: Self-pay | Admitting: Cardiovascular Disease

## 2023-10-11 ENCOUNTER — Ambulatory Visit: Payer: PPO | Attending: Cardiovascular Disease | Admitting: Cardiovascular Disease

## 2023-10-11 VITALS — BP 138/72 | HR 66 | Resp 16 | Ht 72.0 in | Wt 254.6 lb

## 2023-10-11 DIAGNOSIS — I10 Essential (primary) hypertension: Secondary | ICD-10-CM | POA: Diagnosis not present

## 2023-10-11 DIAGNOSIS — E785 Hyperlipidemia, unspecified: Secondary | ICD-10-CM

## 2023-10-11 DIAGNOSIS — I428 Other cardiomyopathies: Secondary | ICD-10-CM

## 2023-10-11 DIAGNOSIS — Z720 Tobacco use: Secondary | ICD-10-CM | POA: Diagnosis not present

## 2023-10-11 NOTE — Patient Instructions (Signed)
Medication Instructions:  Your physician recommends that you continue on your current medications as directed. Please refer to the Current Medication list given to you today.  *If you need a refill on your cardiac medications before your next appointment, please call your pharmacy*  Lab Work: If you have labs (blood work) drawn today and your tests are completely normal, you will receive your results only by: MyChart Message (if you have MyChart) OR A paper copy in the mail If you have any lab test that is abnormal or we need to change your treatment, we will call you to review the results.  Follow-Up: At [facility], you and your health needs are our priority.  As part of our continuing mission to provide you with exceptional heart care, we have created designated Provider Care Teams.  These Care Teams include your primary Cardiologist (physician) and Advanced Practice Providers (APPs -  Physician Assistants and Nurse Practitioners) who all work together to provide you with the care you need, when you need it.  We recommend signing up for the patient portal called "MyChart".  Sign up information is provided on this After Visit Summary.  MyChart is used to connect with patients for Virtual Visits (Telemedicine).  Patients are able to view lab/test results, encounter notes, upcoming appointments, etc.  Non-urgent messages can be sent to your provider as well.   To learn more about what you can do with MyChart, go to [email or web address].    Your next appointment:   6 month(s)  Provider:   Peter Nishan, MD     

## 2023-10-12 ENCOUNTER — Telehealth: Payer: Self-pay

## 2023-10-12 ENCOUNTER — Ambulatory Visit (INDEPENDENT_AMBULATORY_CARE_PROVIDER_SITE_OTHER): Payer: PPO | Admitting: Internal Medicine

## 2023-10-12 ENCOUNTER — Encounter: Payer: Self-pay | Admitting: Internal Medicine

## 2023-10-12 VITALS — BP 132/74 | HR 76 | Temp 99.2°F | Ht 72.0 in | Wt 254.4 lb

## 2023-10-12 DIAGNOSIS — J441 Chronic obstructive pulmonary disease with (acute) exacerbation: Secondary | ICD-10-CM | POA: Diagnosis not present

## 2023-10-12 MED ORDER — BENZONATATE 200 MG PO CAPS: 200.0000 mg | ORAL_CAPSULE | Freq: Three times a day (TID) | ORAL | 0 refills | Status: DC | PRN

## 2023-10-12 MED ORDER — AZITHROMYCIN 250 MG PO TABS: ORAL_TABLET | ORAL | 0 refills | Status: DC

## 2023-10-12 MED ORDER — PREDNISONE 20 MG PO TABS: 40.0000 mg | ORAL_TABLET | Freq: Every day | ORAL | 0 refills | Status: DC

## 2023-10-12 NOTE — Telephone Encounter (Signed)
 Okay---I will check him at the appt

## 2023-10-12 NOTE — Assessment & Plan Note (Signed)
 With apparent chest infection Is on trelegy Needs to use the albuterol Z-pak Prednisone for 10 days Benzonatate If worsens --to ER
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
## 2023-10-12 NOTE — Telephone Encounter (Signed)
 [name]

## 2023-10-12 NOTE — Telephone Encounter (Signed)
 Pt already has appt scheduled with Dr Alphonsus Sias 10/12/23 at 3:30.sending note to Dr Alphonsus Sias and Alphonsus Sias pool.

## 2023-10-31 ENCOUNTER — Other Ambulatory Visit: Payer: Self-pay | Admitting: Nurse Practitioner

## 2023-10-31 ENCOUNTER — Other Ambulatory Visit: Payer: Self-pay | Admitting: Cardiovascular Disease

## 2023-10-31 DIAGNOSIS — E039 Hypothyroidism, unspecified: Secondary | ICD-10-CM

## 2023-11-07 ENCOUNTER — Other Ambulatory Visit: Payer: Self-pay

## 2023-11-07 ENCOUNTER — Emergency Department (HOSPITAL_COMMUNITY): Payer: PPO

## 2023-11-07 ENCOUNTER — Observation Stay (HOSPITAL_COMMUNITY)
Admission: EM | Admit: 2023-11-07 | Discharge: 2023-11-09 | Disposition: A | Discharge status: Home / Self Care | Payer: PPO | Attending: Internal Medicine | Admitting: Internal Medicine

## 2023-11-07 DIAGNOSIS — E039 Hypothyroidism, unspecified: Secondary | ICD-10-CM | POA: Diagnosis not present

## 2023-11-07 DIAGNOSIS — K219 Gastro-esophageal reflux disease without esophagitis: Secondary | ICD-10-CM | POA: Insufficient documentation

## 2023-11-07 DIAGNOSIS — E785 Hyperlipidemia, unspecified: Secondary | ICD-10-CM | POA: Insufficient documentation

## 2023-11-07 DIAGNOSIS — I10 Essential (primary) hypertension: Secondary | ICD-10-CM | POA: Diagnosis present

## 2023-11-07 DIAGNOSIS — R42 Dizziness and giddiness: Principal | ICD-10-CM

## 2023-11-07 DIAGNOSIS — R9389 Abnormal findings on diagnostic imaging of other specified body structures: Secondary | ICD-10-CM | POA: Diagnosis not present

## 2023-11-07 DIAGNOSIS — Z79899 Other long term (current) drug therapy: Secondary | ICD-10-CM | POA: Diagnosis not present

## 2023-11-07 DIAGNOSIS — Z87891 Personal history of nicotine dependence: Secondary | ICD-10-CM | POA: Diagnosis not present

## 2023-11-07 DIAGNOSIS — Z96653 Presence of artificial knee joint, bilateral: Secondary | ICD-10-CM | POA: Insufficient documentation

## 2023-11-07 DIAGNOSIS — I5042 Chronic combined systolic (congestive) and diastolic (congestive) heart failure: Secondary | ICD-10-CM | POA: Insufficient documentation

## 2023-11-07 DIAGNOSIS — N200 Calculus of kidney: Secondary | ICD-10-CM | POA: Diagnosis not present

## 2023-11-07 DIAGNOSIS — Z85850 Personal history of malignant neoplasm of thyroid: Secondary | ICD-10-CM | POA: Insufficient documentation

## 2023-11-07 DIAGNOSIS — K5730 Diverticulosis of large intestine without perforation or abscess without bleeding: Secondary | ICD-10-CM | POA: Diagnosis not present

## 2023-11-07 DIAGNOSIS — N179 Acute kidney failure, unspecified: Secondary | ICD-10-CM | POA: Diagnosis not present

## 2023-11-07 DIAGNOSIS — R519 Headache, unspecified: Secondary | ICD-10-CM | POA: Diagnosis not present

## 2023-11-07 DIAGNOSIS — R079 Chest pain, unspecified: Secondary | ICD-10-CM | POA: Diagnosis not present

## 2023-11-07 DIAGNOSIS — R55 Syncope and collapse: Secondary | ICD-10-CM | POA: Diagnosis not present

## 2023-11-07 DIAGNOSIS — I110 Hypertensive heart disease with heart failure: Secondary | ICD-10-CM | POA: Diagnosis not present

## 2023-11-07 DIAGNOSIS — I700 Atherosclerosis of aorta: Secondary | ICD-10-CM | POA: Diagnosis not present

## 2023-11-07 DIAGNOSIS — D500 Iron deficiency anemia secondary to blood loss (chronic): Secondary | ICD-10-CM | POA: Insufficient documentation

## 2023-11-07 DIAGNOSIS — I2510 Atherosclerotic heart disease of native coronary artery without angina pectoris: Secondary | ICD-10-CM | POA: Diagnosis not present

## 2023-11-07 DIAGNOSIS — J439 Emphysema, unspecified: Secondary | ICD-10-CM | POA: Diagnosis not present

## 2023-11-07 DIAGNOSIS — N281 Cyst of kidney, acquired: Secondary | ICD-10-CM | POA: Diagnosis not present

## 2023-11-07 DIAGNOSIS — R0989 Other specified symptoms and signs involving the circulatory and respiratory systems: Secondary | ICD-10-CM | POA: Diagnosis not present

## 2023-11-07 DIAGNOSIS — Z8546 Personal history of malignant neoplasm of prostate: Secondary | ICD-10-CM | POA: Diagnosis not present

## 2023-11-07 DIAGNOSIS — R918 Other nonspecific abnormal finding of lung field: Secondary | ICD-10-CM | POA: Diagnosis not present

## 2023-11-07 DIAGNOSIS — I959 Hypotension, unspecified: Secondary | ICD-10-CM | POA: Diagnosis not present

## 2023-11-07 DIAGNOSIS — R109 Unspecified abdominal pain: Secondary | ICD-10-CM | POA: Diagnosis not present

## 2023-11-07 DIAGNOSIS — J449 Chronic obstructive pulmonary disease, unspecified: Secondary | ICD-10-CM | POA: Insufficient documentation

## 2023-11-07 LAB — CBC
HCT: 41.2 % (ref 39.0–52.0)
Hemoglobin: 12.7 g/dL — ABNORMAL LOW (ref 13.0–17.0)
MCH: 28 pg (ref 26.0–34.0)
MCHC: 30.8 g/dL (ref 30.0–36.0)
MCV: 90.9 fL (ref 80.0–100.0)
Platelets: 189 10*3/uL (ref 150–400)
RBC: 4.53 MIL/uL (ref 4.22–5.81)
RDW: 13.9 % (ref 11.5–15.5)
WBC: 9.8 10*3/uL (ref 4.0–10.5)
nRBC: 0 % (ref 0.0–0.2)

## 2023-11-07 LAB — URINALYSIS, ROUTINE W REFLEX MICROSCOPIC
Bilirubin Urine: NEGATIVE
Glucose, UA: 50 mg/dL — AB
Ketones, ur: NEGATIVE mg/dL
Leukocytes,Ua: NEGATIVE
Nitrite: NEGATIVE
Protein, ur: >=300 mg/dL — AB
Specific Gravity, Urine: 1.016 (ref 1.005–1.030)
pH: 5 (ref 5.0–8.0)

## 2023-11-07 LAB — HEPATIC FUNCTION PANEL
ALT: 19 U/L (ref 0–44)
AST: 17 U/L (ref 15–41)
Albumin: 3.2 g/dL — ABNORMAL LOW (ref 3.5–5.0)
Alkaline Phosphatase: 74 U/L (ref 38–126)
Bilirubin, Direct: 0.2 mg/dL (ref 0.0–0.2)
Indirect Bilirubin: 0.5 mg/dL (ref 0.3–0.9)
Total Bilirubin: 0.7 mg/dL (ref 0.0–1.2)
Total Protein: 6 g/dL — ABNORMAL LOW (ref 6.5–8.1)

## 2023-11-07 LAB — TROPONIN I (HIGH SENSITIVITY)
Troponin I (High Sensitivity): 12 ng/L (ref <18)
Troponin I (High Sensitivity): 20 ng/L — ABNORMAL HIGH (ref <18)
Troponin I (High Sensitivity): 24 ng/L — ABNORMAL HIGH (ref <18)

## 2023-11-07 LAB — I-STAT CG4 LACTIC ACID, ED: Lactic Acid, Venous: 0.8 mmol/L (ref 0.5–1.9)

## 2023-11-07 LAB — CBG MONITORING, ED: Glucose-Capillary: 100 mg/dL — ABNORMAL HIGH (ref 70–99)

## 2023-11-07 LAB — BASIC METABOLIC PANEL
Anion gap: 11 (ref 5–15)
BUN: 17 mg/dL (ref 8–23)
CO2: 26 mmol/L (ref 22–32)
Calcium: 9.3 mg/dL (ref 8.9–10.3)
Chloride: 106 mmol/L (ref 98–111)
Creatinine, Ser: 1.75 mg/dL — ABNORMAL HIGH (ref 0.61–1.24)
GFR, Estimated: 39 mL/min — ABNORMAL LOW (ref >60)
Glucose, Bld: 93 mg/dL (ref 70–99)
Potassium: 3.8 mmol/L (ref 3.5–5.1)
Sodium: 143 mmol/L (ref 135–145)

## 2023-11-07 LAB — LIPASE, BLOOD: Lipase: 25 U/L (ref 11–51)

## 2023-11-07 MED ORDER — ALBUTEROL SULFATE (2.5 MG/3ML) 0.083% IN NEBU: 3.0000 mL | INHALATION_SOLUTION | Freq: Four times a day (QID) | RESPIRATORY_TRACT | Status: DC | PRN

## 2023-11-07 MED ORDER — ENOXAPARIN SODIUM 60 MG/0.6ML IJ SOSY
50.0000 mg | PREFILLED_SYRINGE | INTRAMUSCULAR | Status: DC
  Administered 2023-11-08 – 2023-11-09 (×2): 50 mg via SUBCUTANEOUS
  Filled 2023-11-07 (×2): qty 0.6

## 2023-11-07 MED ORDER — ATORVASTATIN CALCIUM 40 MG PO TABS
40.0000 mg | ORAL_TABLET | Freq: Every day | ORAL | Status: DC
  Administered 2023-11-08 – 2023-11-09 (×2): 40 mg via ORAL
  Filled 2023-11-07 (×2): qty 1

## 2023-11-07 MED ORDER — LEVOTHYROXINE SODIUM 100 MCG PO TABS
200.0000 ug | ORAL_TABLET | Freq: Every day | ORAL | Status: DC
  Administered 2023-11-08 – 2023-11-09 (×2): 200 ug via ORAL
  Filled 2023-11-07 (×2): qty 2

## 2023-11-07 MED ORDER — PANTOPRAZOLE SODIUM 40 MG PO TBEC
40.0000 mg | DELAYED_RELEASE_TABLET | Freq: Two times a day (BID) | ORAL | Status: DC
Start: 2023-11-07 — End: 2023-11-09
  Administered 2023-11-07 – 2023-11-09 (×4): 40 mg via ORAL
  Filled 2023-11-07 (×4): qty 1

## 2023-11-07 MED ORDER — IOHEXOL 350 MG/ML SOLN
75.0000 mL | Freq: Once | INTRAVENOUS | Status: AC | PRN
  Administered 2023-11-07: 75 mL via INTRAVENOUS

## 2023-11-07 MED ORDER — FLUTICASONE PROPIONATE 50 MCG/ACT NA SUSP
2.0000 | Freq: Two times a day (BID) | NASAL | Status: DC
  Filled 2023-11-07: qty 16

## 2023-11-07 MED ORDER — ACETAMINOPHEN 325 MG PO TABS: 650.0000 mg | ORAL_TABLET | Freq: Four times a day (QID) | ORAL | Status: DC | PRN

## 2023-11-07 MED ORDER — UMECLIDINIUM BROMIDE 62.5 MCG/ACT IN AEPB
1.0000 | INHALATION_SPRAY | Freq: Every day | RESPIRATORY_TRACT | Status: DC
  Administered 2023-11-08 – 2023-11-09 (×2): 1 via RESPIRATORY_TRACT
  Filled 2023-11-07: qty 7

## 2023-11-07 MED ORDER — SODIUM CHLORIDE 0.9 % IV BOLUS
1000.0000 mL | Freq: Once | INTRAVENOUS | Status: AC
  Administered 2023-11-07: 1000 mL via INTRAVENOUS

## 2023-11-07 MED ORDER — ACETAMINOPHEN 650 MG RE SUPP: 650.0000 mg | Freq: Four times a day (QID) | RECTAL | Status: DC | PRN

## 2023-11-07 MED ORDER — FLUTICASONE FUROATE-VILANTEROL 100-25 MCG/ACT IN AEPB
1.0000 | INHALATION_SPRAY | Freq: Every day | RESPIRATORY_TRACT | Status: DC
  Administered 2023-11-09: 1 via RESPIRATORY_TRACT
  Filled 2023-11-07: qty 28

## 2023-11-07 MED ORDER — LORATADINE 10 MG PO TABS
10.0000 mg | ORAL_TABLET | Freq: Every day | ORAL | Status: DC
  Administered 2023-11-08 – 2023-11-09 (×2): 10 mg via ORAL
  Filled 2023-11-07 (×2): qty 1

## 2023-11-07 NOTE — ED Notes (Signed)
Trop sent to lab along with several other lab tubes for any additional add on for lab collection
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
## 2023-11-07 NOTE — ED Notes (Signed)
Room requested from charge RN - going to 2

## 2023-11-07 NOTE — ED Notes (Signed)
Pt given a [place] sandwich and sprite to drink  Pt also with wet underwear and brief, linens wet as well. Full linens change, incontinence pad change and dry brief applied on pt, pt given several warm blankets.

## 2023-11-07 NOTE — ED Provider Notes (Signed)
  Physical Exam  BP (!) 140/71   Pulse 70   Temp 98 F (36.7 C) (Oral)   Resp 14   Wt 115 kg   SpO2 97%   BMI 34.38 kg/m   Physical Exam Constitutional:      General: He is not in acute distress.    Appearance: Normal appearance.  HENT:     Head: Normocephalic and atraumatic.     Nose: No congestion or rhinorrhea.  Eyes:     General:        Right eye: No discharge.        Left eye: No discharge.     Extraocular Movements: Extraocular movements intact.     Pupils: Pupils are equal, round, and reactive to light.  Cardiovascular:     Rate and Rhythm: Normal rate and regular rhythm.     Heart sounds: No murmur heard. Pulmonary:     Effort: No respiratory distress.     Breath sounds: No wheezing or rales.  Abdominal:     General: There is no distension.     Tenderness: There is no abdominal tenderness.  Musculoskeletal:        General: Normal range of motion.     Cervical back: Normal range of motion.  Skin:    General: Skin is warm and dry.  Neurological:     General: No focal deficit present.     Mental Status: He is alert.     Procedures  Procedures  ED Course / MDM    Medical Decision Making Amount and/or Complexity of Data Reviewed Labs: ordered. Radiology: ordered.  Risk Prescription drug management. Decision regarding hospitalization.   Patient received in handoff.  High risk presyncope pending CT imaging.  CT imaging reassuringly negative for acute pathology.  Patient has high risk cardiac history and what sounds like a high risk cardiac presyncopal episode today.  Will require hospital admission for telemetry and echocardiography.  Patient admitted       Albertina Dixon, MD 11/07/23 [phone]
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
## 2023-11-07 NOTE — ED Provider Notes (Signed)
 [place] [facility] AT [facility] Provider Note   CSN: [id number] Arrival date & time: 11/07/23  1419     History  Chief Complaint  Patient presents with   Dizziness    Arthur [name]. is a 83 y.o. male.   Dizziness 83 year old male history of of unremarkable, heart failure, COPD, GERD, hyperlipidemia, nonischemic cardiomyopathy, prostate and thyroid  cancer presenting for episode of dizziness.  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  He was finished driving today and was standing talking to a coworker.  He started to feel somewhat dizzy which he describes as feeling that he is going to pass out.  No spinning sensation.  He got in his truck to try to drive away and felt more dizzy, diaphoretic.  Lasted for several minutes.  With EMS he was hypotensive to the 70s over 50s which improved with fluids.  Sugar was normal.  He received IV Zofran  he was somewhat nauseous.  He reported at the time he had some slight worsening of his chronic chest pain, substernal nonradiating.  Currently has no chest pain, shortness of breath.  He is very mild headache.  No vision changes or weakness or numbness.  No fevers or chills or recent illness.  He has not had any vomiting or diarrhea and has had normal p.o. intake.     Home Medications Prior to Admission medications   Medication Sig Start Date End Date Taking? Authorizing Provider  albuterol  (VENTOLIN  HFA) 108 (90 Base) MCG/ACT inhaler INHALE 1 TO 2 PUFFS INTO THE LUNGS EVERY 6 HOURS AS NEEDED FOR WHEEZING OR SHORTNESS OF BREATH 06/09/23   Parrett, Madelin RAMAN, NP  atorvastatin  (LIPITOR) 40 MG tablet TAKE 1 TABLET(40 MG) BY MOUTH DAILY 10/31/23   Delford Maude BROCKS, MD  azithromycin  (ZITHROMAX  Z-PAK) 250 MG tablet Take 2 tablets (500 mg) on  Day 1,  followed by 1 tablet (250 mg) once daily on Days 2 through 5. 10/12/23   Jimmy Charlie FERNS, MD  benzonatate  (TESSALON ) 200 MG capsule Take 1 capsule (200 mg total) by mouth 3 (three)  times daily as needed for cough. 10/12/23   Jimmy Charlie FERNS, MD  carvedilol  (COREG ) 6.25 MG tablet TAKE 1 TABLET(6.25 MG) BY MOUTH TWICE DAILY WITH A MEAL 03/13/23   Delford Maude BROCKS, MD  cetirizine  (ZYRTEC ) 10 MG tablet Take 1 tablet (10 mg total) by mouth daily. 08/21/23   Soldatova, Liuba, MD  famotidine  (PEPCID ) 40 MG tablet Take 1 tablet (40 mg total) by mouth at bedtime. 05/03/23   Kerman Vina CHRISTELLA, NP  fluconazole  (DIFLUCAN ) 100 MG tablet Take 1 tablet (100 mg total) by mouth daily. Hold statin such as Lipitor while on this medication 08/21/23   Soldatova, Liuba, MD  fluticasone  (FLONASE ) 50 MCG/ACT nasal spray Place 2 sprays into both nostrils 2 (two) times daily. 08/21/23   Soldatova, Liuba, MD  levothyroxine  (SYNTHROID ) 200 MCG tablet TAKE 1 TABLET(200 MCG) BY MOUTH DAILY BEFORE BREAKFAST 11/02/23   [name], NP  Nutritional Supplements (GLUCOSAMINE COMPLEX PO) Take by mouth.    [provider]  pantoprazole  (PROTONIX ) 40 MG tablet TAKE 1 TABLET(40 MG) BY MOUTH TWICE DAILY 08/15/23   [name], NP  predniSONE  (DELTASONE ) 20 MG tablet Take 2 tablets (40 mg total) by mouth daily. For 5 days, then 1 tab daily for 5 days 10/12/23   Jimmy Charlie FERNS, MD  sacubitril -valsartan  (ENTRESTO ) 49-51 MG Take 1 tablet by mouth 2 (two) times daily.  05/08/23   Delford Maude BROCKS, MD  TRELEGY ELLIPTA  100-62.5-25 MCG/ACT AEPB INHALE 1 PUFF INTO THE LUNGS DAILY 06/29/23   Jude Harden GAILS, MD  vitamin B-12 (CYANOCOBALAMIN ) 1000 MCG tablet Take 1,000 mcg by mouth. Once a week    [provider]      Allergies    Doxycycline    Review of Systems   Review of Systems  Neurological:  Positive for dizziness.  Review of systems completed and notable as per HPI.  ROS otherwise negative.   Physical Exam Updated Vital Signs BP 129/72   Pulse 68   Temp 98 F (36.7 C) (Oral)   Resp 13   Wt 115 kg   SpO2 100%   BMI 34.38 kg/m  Physical Exam Vitals and nursing note reviewed.   Constitutional:      General: He is not in acute distress.    Appearance: He is well-developed.  HENT:     Head: Normocephalic and atraumatic.     Nose: Nose normal.     Mouth/Throat:     Mouth: Mucous membranes are moist.     Pharynx: Oropharynx is clear.  Eyes:     Extraocular Movements: Extraocular movements intact.     Conjunctiva/sclera: Conjunctivae normal.     Pupils: Pupils are equal, round, and reactive to light.  Cardiovascular:     Rate and Rhythm: Normal rate and regular rhythm.     Pulses: Normal pulses.     Heart sounds: Normal heart sounds. No murmur heard. Pulmonary:     Effort: Pulmonary effort is normal. No respiratory distress.     Breath sounds: Normal breath sounds.  Abdominal:     Palpations: Abdomen is soft.     Tenderness: There is no abdominal tenderness.  Musculoskeletal:        General: No swelling.     Cervical back: Neck supple.     Right lower leg: Edema present.     Left lower leg: Edema present.  Skin:    General: Skin is warm and dry.     Capillary Refill: Capillary refill takes less than 2 seconds.  Neurological:     General: No focal deficit present.     Mental Status: He is alert and oriented to person, place, and time. Mental status is at baseline.     Cranial Nerves: No cranial nerve deficit.     Sensory: No sensory deficit.     Motor: No weakness.     Coordination: Coordination normal.  Psychiatric:        Mood and Affect: Mood normal.     ED Results / Procedures / Treatments   Labs (all labs ordered are listed, but only abnormal results are displayed) Labs Reviewed  BASIC METABOLIC PANEL - Abnormal; Notable for the following components:      Result Value   Creatinine, Ser 1.75 (*)    GFR, Estimated 39 (*)    All other components within normal limits  CBC - Abnormal; Notable for the following components:   Hemoglobin 12.7 (*)    All other components within normal limits  CBG MONITORING, ED - Abnormal; Notable for the  following components:   Glucose-Capillary 100 (*)    All other components within normal limits  LIPASE, BLOOD  URINALYSIS, ROUTINE W REFLEX MICROSCOPIC  HEPATIC FUNCTION PANEL  I-STAT CG4 LACTIC ACID, ED  I-STAT CG4 LACTIC ACID, ED  TROPONIN I (HIGH SENSITIVITY)  TROPONIN I (HIGH SENSITIVITY)    EKG EKG Interpretation Date/Time:  Tuesday November 07 2023 14:27:56 EST Ventricular Rate:  76 PR Interval:  316 QRS Duration:  134 QT Interval:  430 QTC Calculation: 483 R Axis:   50  Text Interpretation: Sinus rhythm with 1st degree A-V block  LBBB    When compared with ECG of 07-Mar-2022 07:48, PREVIOUS ECG IS PRESENT LBBB and inferolateral TWI similar to prior Confirmed by Jenilyn Magana [phone]) on 11/07/2023 3:01:55 PM  Radiology DG Chest 2 View Result Date: 11/07/2023 CLINICAL DATA:  dizziness EXAM: CHEST - 2 VIEW COMPARISON:  CT chest 12/15/2022. FINDINGS: The heart and mediastinal contours are within normal limits. Atherosclerotic plaque. Prominent hilar vasculature bilaterally. Hyperinflation of the lungs. No focal consolidation. Slightly coarsened interstitial markings with no overt pulmonary edema. No pleural effusion. No pneumothorax. No acute osseous abnormality. IMPRESSION: 1. Pulmonary venous congestion.  No frank pulmonary edema. 2. Aortic Atherosclerosis (ICD10-I70.0) and Emphysema (ICD10-J43.9). Electronically Signed   By: Morgane  Naveau M.D.   On: 11/07/2023 16:06    Procedures Procedures    Medications Ordered in ED Medications  sodium chloride  0.9 % bolus 1,000 mL (0 mLs Intravenous Stopped 11/07/23 1635)    ED Course/ Medical Decision Making/ A&P                                 Medical Decision Making Amount and/or Complexity of Data Reviewed Labs: ordered. Radiology: ordered.   Medical Decision Making:   Arthur [name]. is a 83 y.o. male who presented to the ED today with dizziness, hypotension.  On arrival here he was initially hypotensive  although is improving with fluids.  He has episode of dizziness which sounds more like presyncope associate with hypotension and diaphoresis.  He did have some slight chest pain at the time.  Currently he is chest pain-free.  EKG read demonstrates right bundle branch block with first-degree AV block, I do not see signs of acute ischemia at this time will trend troponin.  No radiation to his back or other signs or symptoms of dissection.  I am concern for possible PE given recent travel, leg swelling.  He also has mild headache as well, is no neurologic deficits on exam but will obtain CT head for evaluation as well as lab workup.   Patient placed on continuous vitals and telemetry monitoring while in ED which was reviewed periodically.  Reviewed and confirmed nursing documentation for past medical history, family history, social history.  Reassessment and Plan:   On evaluation he remained stable.  Lab work notable for mild AKI, initial troponin is normal.  Chest x-ray reviewed and unremarkable.  CT of the abdomen, chest, head are pending as of the remainder of the labs.  Handoff given to Dr. Albertina will plan to follow-up lab work and reassess.   Patient's presentation is most consistent with acute complicated illness / injury requiring diagnostic workup.           Final Clinical Impression(s) / ED Diagnoses Final diagnoses:  Dizziness    Rx / DC Orders ED Discharge Orders     None         Nicholaus Cassondra DEL, MD 11/07/23 [phone]

## 2023-11-08 ENCOUNTER — Encounter (HOSPITAL_COMMUNITY): Payer: Self-pay | Admitting: Internal Medicine

## 2023-11-08 ENCOUNTER — Observation Stay (HOSPITAL_COMMUNITY): Payer: PPO

## 2023-11-08 DIAGNOSIS — R079 Chest pain, unspecified: Secondary | ICD-10-CM

## 2023-11-08 DIAGNOSIS — R55 Syncope and collapse: Secondary | ICD-10-CM | POA: Diagnosis not present

## 2023-11-08 DIAGNOSIS — R42 Dizziness and giddiness: Secondary | ICD-10-CM

## 2023-11-08 DIAGNOSIS — I959 Hypotension, unspecified: Secondary | ICD-10-CM

## 2023-11-08 LAB — ECHOCARDIOGRAM COMPLETE
AR max vel: 2.7 cm2
AV Area VTI: 2.64 cm2
AV Area mean vel: 2.58 cm2
AV Mean grad: 5 mm[Hg]
AV Peak grad: 10 mm[Hg]
Ao pk vel: 1.58 m/s
Area-P 1/2: 2.3 cm2
Calc EF: 51 %
MV VTI: 2.47 cm2
S' Lateral: 2.6 cm
Single Plane A2C EF: 50.4 %
Single Plane A4C EF: 52 %
Weight: 4056.46 [oz_av]

## 2023-11-08 LAB — CBC
HCT: 35.3 % — ABNORMAL LOW (ref 39.0–52.0)
Hemoglobin: 10.9 g/dL — ABNORMAL LOW (ref 13.0–17.0)
MCH: 28.2 pg (ref 26.0–34.0)
MCHC: 30.9 g/dL (ref 30.0–36.0)
MCV: 91.2 fL (ref 80.0–100.0)
Platelets: 140 10*3/uL — ABNORMAL LOW (ref 150–400)
RBC: 3.87 MIL/uL — ABNORMAL LOW (ref 4.22–5.81)
RDW: 14 % (ref 11.5–15.5)
WBC: 5.9 10*3/uL (ref 4.0–10.5)
nRBC: 0 % (ref 0.0–0.2)

## 2023-11-08 LAB — CBG MONITORING, ED: Glucose-Capillary: 143 mg/dL — ABNORMAL HIGH (ref 70–99)

## 2023-11-08 LAB — BASIC METABOLIC PANEL
Anion gap: 10 (ref 5–15)
BUN: 19 mg/dL (ref 8–23)
CO2: 24 mmol/L (ref 22–32)
Calcium: 8.7 mg/dL — ABNORMAL LOW (ref 8.9–10.3)
Chloride: 104 mmol/L (ref 98–111)
Creatinine, Ser: 1.3 mg/dL — ABNORMAL HIGH (ref 0.61–1.24)
GFR, Estimated: 55 mL/min — ABNORMAL LOW (ref >60)
Glucose, Bld: 142 mg/dL — ABNORMAL HIGH (ref 70–99)
Potassium: 3.9 mmol/L (ref 3.5–5.1)
Sodium: 138 mmol/L (ref 135–145)

## 2023-11-08 LAB — TROPONIN I (HIGH SENSITIVITY): Troponin I (High Sensitivity): 20 ng/L — ABNORMAL HIGH (ref <18)

## 2023-11-08 MED ORDER — SODIUM CHLORIDE 0.9 % IV SOLN: INTRAVENOUS | Status: AC

## 2023-11-08 NOTE — Progress Notes (Signed)
  Echocardiogram 2D Echocardiogram has been performed.  Kenna Seward L Roshun Klingensmith RDCS 11/08/2023, 2:45 PM

## 2023-11-08 NOTE — ED Notes (Signed)
 PT is c/o of left sided pain that is right under the left breast area. Pain is not inside of the body but only on the skin.Pt becomes extremely sensitive when being touched. There is no skin irritation or rash noted. DO Arthur [name], Arthur [name] was notified.

## 2023-11-08 NOTE — Progress Notes (Signed)
 PROGRESS NOTE    Arthur [name].  FMW:[id number] DOB: Feb 12, 1941 DOA: 11/07/2023 PCP: Wendee Arthur CHRISTELLA, NP    Brief Narrative:  Arthur [name]. is a 83 y.o. male with medical history significant of chronic HFrEF/nonischemic cardiomyopathy, LBBB, first-degree AV block, COPD, hypertension, hyperlipidemia, GERD, chronic anemia, prostate cancer status post prostatectomy, thyroid  cancer status post thyroidectomy, hypothyroidism presented to the ED for evaluation of dizziness, hypotension, near syncope, and chest pain.    Assessment and Plan: Chest pain, hypotension, near syncope- suspect due to dehydration EKG showing first-degree AV block and LBBB which are not new.  However, does show new inferolateral T wave inversions compared to previous EKG from June 2023.  Troponin 12> 20> 24.  Cath done in June 2023 showing mild nonobstructive CAD. Patient is no longer having active chest pain and hypotension has resolved after IV fluids. CTA chest negative for PE or any other acute cardiopulmonary abnormality -orthos +-- recheck in AM after IVF -echo pending   AKI -daily labs -improved with IVF   Hypertension Hold antihypertensives at this time   Chronic anemia Hemoglobin stable   Abnormal CT finding CT showing possible signs of early cirrhosis.  Patient denies history of alcohol abuse. -outpatient follow up   Chronic HFmrEF Echo done in April 2023 showing EF 50%, mild MVR, mild AVR.  No signs of volume overload at this time.  Repeat echocardiogram ordered.   COPD Stable, no signs of acute exacerbation.  Continue home medications.   Hyperlipidemia Continue statin.   Hypothyroidism Continue Synthroid  and check TSH.   GERD Continue PPI.   DVT prophylaxis:     Code Status: Full Code   Disposition Plan:  Level of care: Telemetry Cardiac     Consultants:  none   Subjective: Thinks he has not been drinking much fluid and working outside  Objective: Vitals:    11/08/23 0700 11/08/23 0900 11/08/23 1000 11/08/23 1137  BP: (!) 164/94 (!) 154/96 (!) 142/79   Pulse: (!) 58 65 62   Resp: 13 14 19    Temp:    (!) 97.5 F (36.4 C)  TempSrc:    Oral  SpO2: 97% 97% 100%   Weight:       No intake or output data in the 24 hours ending 11/08/23 1303 Filed Weights   11/07/23 1430  Weight: 115 kg    Examination:   General: Appearance:    Obese male in no acute distress     Lungs:     respirations unlabored  Heart:    Normal heart rate.    MS:   All extremities are intact.    Neurologic:   Awake, alert,       Data Reviewed: I have personally reviewed following labs and imaging studies  CBC: Recent Labs  Lab 11/07/23 1430 11/08/23 1038  WBC 9.8 5.9  HGB 12.7* 10.9*  HCT 41.2 35.3*  MCV 90.9 91.2  PLT 189 140*   Basic Metabolic Panel: Recent Labs  Lab 11/07/23 1430 11/08/23 1038  NA 143 138  K 3.8 3.9  CL 106 104  CO2 26 24  GLUCOSE 93 142*  BUN 17 19  CREATININE 1.75* 1.30*  CALCIUM  9.3 8.7*   GFR: Estimated Creatinine Clearance: 58.4 mL/min (A) (by C-G formula based on SCr of 1.3 mg/dL (H)). Liver Function Tests: Recent Labs  Lab 11/07/23 1528  AST 17  ALT 19  ALKPHOS 74  BILITOT 0.7  PROT 6.0*  ALBUMIN 3.2*   Recent  Labs  Lab 11/07/23 1528  LIPASE 25   No results for input(s): AMMONIA in the last 168 hours. Coagulation Profile: No results for input(s): INR, PROTIME in the last 168 hours. Cardiac Enzymes: No results for input(s): CKTOTAL, CKMB, CKMBINDEX, TROPONINI in the last 168 hours. BNP (last 3 results) Recent Labs    03/23/23 1242  PROBNP 68.0   HbA1C: No results for input(s): HGBA1C in the last 72 hours. CBG: Recent Labs  Lab 11/07/23 1455 11/08/23 1016  GLUCAP 100* 143*   Lipid Profile: No results for input(s): CHOL, HDL, LDLCALC, TRIG, CHOLHDL, LDLDIRECT in the last 72 hours. Thyroid  Function Tests: No results for input(s): TSH, T4TOTAL, FREET4,  T3FREE, THYROIDAB in the last 72 hours. Anemia Panel: No results for input(s): VITAMINB12, FOLATE, FERRITIN, TIBC, IRON, RETICCTPCT in the last 72 hours. Sepsis Labs: Recent Labs  Lab 11/07/23 1908  LATICACIDVEN 0.8    No results found for this or any previous visit (from the past 240 hours).       Radiology Studies: CT ABDOMEN PELVIS W CONTRAST Result Date: 11/07/2023 CLINICAL DATA:  Abdominal pain, acute, nonlocalized. EXAM: CT ABDOMEN AND PELVIS WITH CONTRAST TECHNIQUE: Multidetector CT imaging of the abdomen and pelvis was performed using the standard protocol following bolus administration of intravenous contrast. RADIATION DOSE REDUCTION: This exam was performed according to the departmental dose-optimization program which includes automated exposure control, adjustment of the mA and/or kV according to patient size and/or use of iterative reconstruction technique. CONTRAST:  75mL OMNIPAQUE  IOHEXOL  350 MG/ML SOLN COMPARISON:  01/17/2022 FINDINGS: Lower chest: Scarring or atelectasis in the lingula. Hepatobiliary: Previous cholecystectomy. Question early cirrhosis. Prominence of the left lobe with slight lobularity of the liver surface. No focal lesion. Pancreas: Normal Spleen: Normal Adrenals/Urinary Tract: Adrenal glands are normal. Multiple bilateral renal cysts, for which no specific follow-up is recommended. Nonobstructing 7 mm stone in the lower pole of the left kidney. No hydronephrosis on either side. Low volume bladder without abnormal finding. Stomach/Bowel: Stomach and small intestine are normal. Normal appendix. Mild diverticulosis of the left colon without visible diverticulitis. Vascular/Lymphatic: Aortic atherosclerosis. No aneurysm. IVC is normal. No adenopathy. Reproductive: Previous prostatectomy. Other: No free fluid or air. Musculoskeletal: Ordinary lumbar degenerative changes. IMPRESSION: 1. No acute finding to explain the clinical presentation. 2. Previous  cholecystectomy. Question early cirrhosis. 3. Nonobstructing 7 mm stone in the lower pole of the left kidney. 4. Aortic atherosclerosis. 5. Previous prostatectomy. Electronically Signed   By: Oneil Officer M.D.   On: 11/07/2023 18:40   CT Angio Chest PE W/Cm &/Or Wo Cm Result Date: 11/07/2023 CLINICAL DATA:  Pulmonary embolism suspected.  High probability. EXAM: CT ANGIOGRAPHY CHEST WITH CONTRAST TECHNIQUE: Multidetector CT imaging of the chest was performed using the standard protocol during bolus administration of intravenous contrast. Multiplanar CT image reconstructions and MIPs were obtained to evaluate the vascular anatomy. RADIATION DOSE REDUCTION: This exam was performed according to the departmental dose-optimization program which includes automated exposure control, adjustment of the mA and/or kV according to patient size and/or use of iterative reconstruction technique. CONTRAST:  75mL OMNIPAQUE  IOHEXOL  350 MG/ML SOLN COMPARISON:  Chest radiography same day FINDINGS: Cardiovascular: Heart size upper limits of normal. No pericardial fluid. Coronary artery calcification and aortic atherosclerotic calcification are present. Pulmonary arterial opacification is good. There are no pulmonary emboli. Mediastinum/Nodes: No mass or adenopathy. Lungs/Pleura: No pleural effusion. Mild scarring or atelectasis in the lingula. No sign of acute pneumonia. No mass and no nodule in need of further  follow-up. Upper Abdomen: No acute finding.  Previous cholecystectomy. Musculoskeletal: No acute bone finding. Ordinary spinal degenerative change. Review of the MIP images confirms the above findings. IMPRESSION: 1. No pulmonary emboli. 2. Coronary artery calcification and aortic atherosclerotic calcification. 3. Mild scarring or atelectasis in the lingula. 4. Previous cholecystectomy. 5. Aortic atherosclerosis. Electronically Signed   By: Oneil Officer M.D.   On: 11/07/2023 18:36   CT Head Wo Contrast Result Date:  11/07/2023 CLINICAL DATA:  Dizziness, headache EXAM: CT HEAD WITHOUT CONTRAST TECHNIQUE: Contiguous axial images were obtained from the base of the skull through the vertex without intravenous contrast. RADIATION DOSE REDUCTION: This exam was performed according to the departmental dose-optimization program which includes automated exposure control, adjustment of the mA and/or kV according to patient size and/or use of iterative reconstruction technique. COMPARISON:  12/27/2019 FINDINGS: Brain: No evidence of acute infarction, hemorrhage, mass, mass effect, or midline shift. No hydrocephalus or extra-axial fluid collection. Vascular: No hyperdense vessel. Skull: Negative for fracture or focal lesion. Sinuses/Orbits: Mucosal thickening throughout the paranasal sinuses, most prominently in the ethmoid air cells. No acute finding in the orbits. Status post bilateral lens replacements. Other: Trace fluid in the bilateral mastoid air cells. IMPRESSION: 1. No acute intracranial process. 2. Mucosal thickening throughout the paranasal sinuses, most prominently in the ethmoid air cells. Electronically Signed   By: Donald Campion M.D.   On: 11/07/2023 18:32   DG Chest 2 View Result Date: 11/07/2023 CLINICAL DATA:  dizziness EXAM: CHEST - 2 VIEW COMPARISON:  CT chest 12/15/2022. FINDINGS: The heart and mediastinal contours are within normal limits. Atherosclerotic plaque. Prominent hilar vasculature bilaterally. Hyperinflation of the lungs. No focal consolidation. Slightly coarsened interstitial markings with no overt pulmonary edema. No pleural effusion. No pneumothorax. No acute osseous abnormality. IMPRESSION: 1. Pulmonary venous congestion.  No frank pulmonary edema. 2. Aortic Atherosclerosis (ICD10-I70.0) and Emphysema (ICD10-J43.9). Electronically Signed   By: Morgane  Naveau M.D.   On: 11/07/2023 16:06        Scheduled Meds:  atorvastatin   40 mg Oral Daily   enoxaparin  (LOVENOX ) injection  50 mg Subcutaneous  Q24H   fluticasone   2 spray Each Nare BID   fluticasone  furoate-vilanterol  1 puff Inhalation Daily   And   umeclidinium bromide   1 puff Inhalation Daily   levothyroxine   200 mcg Oral Q0600   loratadine   10 mg Oral Daily   pantoprazole   40 mg Oral BID   Continuous Infusions:  sodium chloride  75 mL/hr at 11/08/23 0543     LOS: 0 days    Time spent: 45 minutes spent on chart review, discussion with nursing staff, consultants, updating family and interview/physical exam; more than 50% of that time was spent in counseling and/or coordination of care.    Harlene RAYMOND Bowl, DO [facility] Available via Epic secure chat 7am-7pm After these hours, please refer to coverage provider listed on [email or web address] 11/08/2023, 1:03 PM

## 2023-11-08 NOTE — Evaluation (Signed)
 Physical Therapy Evaluation Patient Details Name: Arthur [name]. MRN: [id number] DOB: 1940/11/29 Today's Date: 11/08/2023  History of Present Illness  Arthur [name]. is a 83 y.o. male presented to the ED for evaluation of dizziness, hypotension, near syncope, and chest pain. PMH: chronic HFrEF/nonischemic cardiomyopathy, LBBB, first-degree AV block, COPD, hypertension, hyperlipidemia, GERD, chronic anemia, prostate cancer status post prostatectomy, thyroid  cancer status post thyroidectomy, hypothyroidism  Clinical Impression  Pt admitted with above diagnosis. Pt was able to ambulate a short distance without device however was slightly unsteady and dizziness reported.  VSS with pt on RA.  Discussed need for use of RW intiially and pt and girlfriend agree.  HHPT recommended with transition to Outpt PT as well and girlfriend is prepared for pt to stay with her intiailly on d/c. Pt was very active PTA.   Pt currently with functional limitations due to the deficits listed below (see PT Problem List). Pt will benefit from acute skilled PT to increase their independence and safety with mobility to allow discharge.           If plan is discharge home, recommend the following: A little help with walking and/or transfers;A little help with bathing/dressing/bathroom;Assistance with cooking/housework;Assist for transportation;Help with stairs or ramp for entrance   Can travel by private vehicle        Equipment Recommendations Rolling walker (2 wheels) (needs tall RW)  Recommendations for Other Services       Functional Status Assessment Patient has had a recent decline in their functional status and demonstrates the ability to make significant improvements in function in a reasonable and predictable amount of time.     Precautions / Restrictions Precautions Precautions: Fall Restrictions Weight Bearing Restrictions Per Provider Order: No      Mobility  Bed Mobility Overal bed mobility:  Independent                  Transfers Overall transfer level: Needs assistance Equipment used: None Transfers: Sit to/from Stand Sit to Stand: Supervision           General transfer comment: Pt wears Depends normally and was unsteady as he kept worrying about diaper falling off.  PT reinforced it and the pt can stand statically without assist.    Ambulation/Gait Ambulation/Gait assistance: Min assist Gait Distance (Feet): 50 Feet Assistive device: None, IV Pole Gait Pattern/deviations: Step-through pattern, Decreased stride length, Wide base of support   Gait velocity interpretation: <1.31 ft/sec, indicative of household ambulator   General Gait Details: Pt walked to hallway and wanted to turn around due to stating  i feel dizzy..  VSS however he also was worried with the diaper and that he may urinate more and it was full.  Pt is slightly unsteady overall with gait and states that he feels off balance.  Pt appears unsure on his feet and with small steps bilaterally.  Stairs            Wheelchair Mobility     Tilt Bed    Modified Rankin (Stroke Patients Only)       Balance Overall balance assessment: Needs assistance Sitting-balance support: No upper extremity supported, Feet supported Sitting balance-Leahy Scale: Fair     Standing balance support: No upper extremity supported, During functional activity Standing balance-Leahy Scale: Fair                               Pertinent Vitals/Pain  Pain Assessment Pain Assessment: No/denies pain    Home Living Family/patient expects to be discharged to:: Private residence Living Arrangements: Alone Available Help at Discharge: Friend(s);Available PRN/intermittently (girlfriend and neighbor) Type of Home: House Home Access: Stairs to enter Entrance Stairs-Rails: Doctor, General Practice of Steps: 3   Home Layout: One level Home Equipment: Agricultural Consultant (2 wheels);BSC/3in1;Cane  - single point;Shower seat - built in Additional Comments: 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  Plans to go to girlfriend's house when he leaves, 2 steps to get in.    Prior Function Prior Level of Function : Independent/Modified Independent;Driving;Working/employed             Mobility Comments: Independent withtout equipment, goes dancing Th, F and Sat nights country music bar ADLs Comments: B/D self, does yardwork and housework     Extremity/Trunk Assessment   Upper Extremity Assessment Upper Extremity Assessment: Defer to OT evaluation    Lower Extremity Assessment Lower Extremity Assessment: Overall WFL for tasks assessed    Cervical / Trunk Assessment Cervical / Trunk Assessment: Normal  Communication   Communication Communication: Hearing impairment  Cognition Arousal: Alert Behavior During Therapy: WFL for tasks assessed/performed Overall Cognitive Status: Within Functional Limits for tasks assessed                                          General Comments General comments (skin integrity, edema, etc.): 62 bpm, 100% 2LO2, 155/83, 98% RA, sitting 65 bpm, 157/81, standing 68 bpm, 152/79, standing 3 min 64 bpm, 166/70.  Nurse made aware that pt sats >90% on RA and agreed for PT to remove O2.    Exercises     Assessment/Plan    PT Assessment Patient needs continued PT services  PT Problem List Decreased activity tolerance;Decreased balance;Decreased mobility;Decreased knowledge of use of DME;Decreased safety awareness;Decreased knowledge of precautions;Cardiopulmonary status limiting activity       PT Treatment Interventions DME instruction;Gait training;Functional mobility training;Stair training;Therapeutic activities;Therapeutic exercise;Balance training;Patient/family education    PT Goals (Current goals can be found in the Care Plan section)  Acute Rehab PT Goals Patient Stated Goal: to go home PT Goal Formulation: With  patient Time For Goal Achievement: 11/22/23 Potential to Achieve Goals: Good    Frequency Min 1X/week     Co-evaluation               AM-PAC PT 6 Clicks Mobility  Outcome Measure Help needed turning from your back to your side while in a flat bed without using bedrails?: None Help needed moving from lying on your back to sitting on the side of a flat bed without using bedrails?: None Help needed moving to and from a bed to a chair (including a wheelchair)?: A Little Help needed standing up from a chair using your arms (e.g., wheelchair or bedside chair)?: A Little Help needed to walk in hospital room?: A Little Help needed climbing 3-5 steps with a railing? : A Lot 6 Click Score: 19    End of Session Equipment Utilized During Treatment: Gait belt Activity Tolerance: Patient limited by fatigue Patient left: with call bell/phone within reach;with family/visitor present (on stretcher) Nurse Communication: Mobility status PT Visit Diagnosis: Unsteadiness on feet (R26.81);Muscle weakness (generalized) (M62.81)    Time: 8894-8864 PT Time Calculation (min) (ACUTE ONLY): 30 min   Charges:   PT Evaluation $PT Eval Moderate Complexity:  1 Mod PT Treatments $Gait Training: 8-22 mins PT General Charges $$ ACUTE PT VISIT: 1 Visit         Catrinia Racicot M,PT Acute Rehab Services [phone]   Stephane JULIANNA Bevel 11/08/2023, 1:51 PM

## 2023-11-09 DIAGNOSIS — R55 Syncope and collapse: Secondary | ICD-10-CM | POA: Diagnosis not present

## 2023-11-09 DIAGNOSIS — R42 Dizziness and giddiness: Secondary | ICD-10-CM | POA: Diagnosis not present

## 2023-11-09 LAB — CBC
HCT: 36.5 % — ABNORMAL LOW (ref 39.0–52.0)
Hemoglobin: 11.3 g/dL — ABNORMAL LOW (ref 13.0–17.0)
MCH: 28 pg (ref 26.0–34.0)
MCHC: 31 g/dL (ref 30.0–36.0)
MCV: 90.3 fL (ref 80.0–100.0)
Platelets: 151 10*3/uL (ref 150–400)
RBC: 4.04 MIL/uL — ABNORMAL LOW (ref 4.22–5.81)
RDW: 13.8 % (ref 11.5–15.5)
WBC: 6.9 10*3/uL (ref 4.0–10.5)
nRBC: 0 % (ref 0.0–0.2)

## 2023-11-09 LAB — TSH: TSH: 1.594 u[IU]/mL (ref 0.350–4.500)

## 2023-11-09 LAB — BASIC METABOLIC PANEL
Anion gap: 8 (ref 5–15)
BUN: 19 mg/dL (ref 8–23)
CO2: 25 mmol/L (ref 22–32)
Calcium: 8.5 mg/dL — ABNORMAL LOW (ref 8.9–10.3)
Chloride: 106 mmol/L (ref 98–111)
Creatinine, Ser: 1.22 mg/dL (ref 0.61–1.24)
GFR, Estimated: 60 mL/min — ABNORMAL LOW (ref >60)
Glucose, Bld: 112 mg/dL — ABNORMAL HIGH (ref 70–99)
Potassium: 3.9 mmol/L (ref 3.5–5.1)
Sodium: 139 mmol/L (ref 135–145)

## 2023-11-09 MED ORDER — SACUBITRIL-VALSARTAN 49-51 MG PO TABS
1.0000 | ORAL_TABLET | Freq: Two times a day (BID) | ORAL | Status: DC
Start: 2023-11-09 — End: 2023-11-09
  Administered 2023-11-09: 1 via ORAL
  Filled 2023-11-09: qty 1

## 2023-11-09 MED ORDER — CARVEDILOL 3.125 MG PO TABS
6.2500 mg | ORAL_TABLET | Freq: Two times a day (BID) | ORAL | Status: DC
  Administered 2023-11-09: 6.25 mg via ORAL
  Filled 2023-11-09: qty 2

## 2023-11-09 NOTE — TOC Initial Note (Signed)
 Transition of Care (TOC) - Initial/Assessment Note    Patient Details  Name: Arthur [name]. MRN: [id number] Date of Birth: 28-Jan-1941  Transition of [facility]) CM/SW Contact:    Debarah Saunas, RN Phone Number: 11/09/2023, 9:33 AM  Clinical Narrative:                 RNCM met with pt at bedside regarding discharge plan.  Pt states he has very supportive girlfriend and lots of DME at home including rolling walker, cane, and bedside commode.  Pt states he receives [facility] health visits and is willing to have additional help if necessary.  TOC will continue to follow for additional discharge needs.  Expected Discharge Plan: Home w Home Health Services Barriers to Discharge: Continued Medical Work up   Patient Goals and CMS Choice Patient states their goals for this hospitalization and ongoing recovery are:: I hope to feel better and go home.          Expected Discharge Plan and Services   Discharge Planning Services: CM Consult   Living arrangements for the past 2 months: Single Family Home                                      Prior Living Arrangements/Services Living arrangements for the past 2 months: Single Family Home Lives with:: Domestic Partner Patient language and need for interpreter reviewed:: Yes Do you feel safe going back to the place where you live?: Yes      Need for Family Participation in Patient Care: Yes (Comment) Care giver support system in place?: Yes (comment) Current home services: DME Criminal Activity/Legal Involvement Pertinent to Current Situation/Hospitalization: No - Comment as needed  Activities of Daily Living   ADL Screening (condition at time of admission) Independently performs ADLs?: Yes (appropriate for developmental age) Is the patient deaf or have difficulty hearing?: Yes Does the patient have difficulty seeing, even when wearing glasses/contacts?: No Does the patient have difficulty concentrating, remembering, or  making decisions?: No  Permission Sought/Granted Permission sought to share information with : Case Manager Permission granted to share information with : Yes, Verbal Permission Granted              Emotional Assessment Appearance:: Developmentally appropriate, Appears stated age Attitude/Demeanor/Rapport: Ambitious, Engaged, Self-Confident, Gracious Affect (typically observed): Calm, Accepting, Pleasant Orientation: : Oriented to Self, Oriented to Place, Oriented to  Time, Oriented to Situation Alcohol / Substance Use: Tobacco Use (former) Psych Involvement: No (comment)  Admission diagnosis:  Chest pain [R07.9] Patient Active Problem List   Diagnosis Date Noted   Hypotension 11/08/2023   Near syncope 11/08/2023   Chest pain 11/07/2023   Preventative health care 01/09/2023   Acute pain of lower extremity, right 06/01/2022   Rash 05/09/2022   Globus sensation 05/09/2022   Thoracic aortic aneurysm (HCC) 01/11/2022   Fatigue 01/11/2022   COPD exacerbation (HCC) 12/27/2021   [name] 07/16/2021   History of thyroid  cancer 07/16/2021   Vestibular ataxic gait 04/14/2021   Physical deconditioning 04/14/2021   AKI (acute kidney injury) (HCC) 04/08/2021   (HFpEF) heart failure with preserved ejection fraction (HCC) 04/08/2021   Hyperkalemia 04/08/2021   Chronic combined systolic and diastolic CHF (congestive heart failure) (HCC) 02/18/2020   Aortic atherosclerosis (HCC) 01/23/2020   Esophageal dysphagia 04/04/2018   Dizziness 02/27/2018   Calculus of gallbladder with acute cholecystitis and obstruction  Epigastric pain 03/30/2017   COPD (chronic obstructive pulmonary disease) (HCC) 01/12/2017   Chest pain of uncertain etiology    Centrilobular emphysema (HCC) 10/13/2014   Left ureteral stone 09/24/2014   Renal calculus, left 09/24/2014   Ureteral stricture, left 09/24/2014   Osteoarthritis of left knee 08/03/2012   Nicotine addiction 05/10/2012    History of colonic polyps 01/26/2012   GERD (gastroesophageal reflux disease)    Migraines    ED (erectile dysfunction)    Hypothyroidism (acquired) 12/30/2008   HYPERCHOLESTEROLEMIA 12/30/2008   Essential hypertension 12/30/2008   Non-ischemic cardiomyopathy (HCC) 12/30/2008   LBBB (left bundle branch block) 12/30/2008   Multiple lung nodules on CT 12/30/2008   Arthropathy 12/30/2008   Dyspnea 12/30/2008   PCP:  Wendee Lynwood HERO, NP Pharmacy:   [id number] - [place], [place] - [address] AT [facility] [address] [place] [address] Phone: [phone] Fax: [phone]     Social Drivers of Health (SDOH) Social History: SDOH Screenings   Food Insecurity: No Food Insecurity (11/07/2023)  Housing: Low Risk  (11/07/2023)  Transportation Needs: No Transportation Needs (11/07/2023)  Utilities: Not At Risk (11/07/2023)  Alcohol Screen: Low Risk  (02/09/2023)  Depression (PHQ2-9): Low Risk  (10/12/2023)  Financial Resource Strain: Low Risk  (02/09/2023)  Physical Activity: Sufficiently Active (02/09/2023)  Social Connections: Moderately Integrated (11/07/2023)  Stress: No Stress Concern Present (02/09/2023)  Tobacco Use: Medium Risk (11/08/2023)   SDOH Interventions:     Readmission Risk Interventions     No data to display

## 2023-11-09 NOTE — Discharge Summary (Signed)
 Physician Discharge Summary  Arthur [name]. FMW:[id number] DOB: 07/30/1941 DOA: 11/07/2023  PCP: Arthur [name] CHRISTELLA, NP  Admit date: 11/07/2023 Discharge date: 11/09/2023  Admitted From: home Discharge disposition: home   Recommendations for Outpatient Follow-Up:   Cards follow up for limited echo with definity Outpatient follow up for cirrhosis   Discharge Diagnosis:   Principal Problem:   Chest pain Active Problems:   Essential hypertension   Hypothyroidism (acquired)   GERD (gastroesophageal reflux disease)   AKI (acute kidney injury) (HCC)   Hypotension   Near syncope    Discharge Condition: Improved.  Diet recommendation: Low sodium, heart healthy.   Wound care: None.  Code status: Full.   History of Present Illness:   Arthur [name]. is a 83 y.o. male with medical history significant of chronic HFrEF/nonischemic cardiomyopathy, LBBB, first-degree AV block, COPD, hypertension, hyperlipidemia, GERD, chronic anemia, prostate cancer status post prostatectomy, thyroid  cancer status post thyroidectomy, hypothyroidism presented to the ED for evaluation of dizziness, hypotension, near syncope, and chest pain.  On EMS arrival, patient found to be diaphoretic and felt dizzy and nauseous.  His blood pressure was 78/50, heart rate 90, CBG 120.  He was given 500 mL IV fluids and IV Zofran  in route.  Vital signs on arrival to the ED: Temperature 98 F, pulse 74, respiratory rate 16, blood pressure 82/56, and SpO2 96% on room air.  Labs showing no leukocytosis, hemoglobin 12.7 (stable), creatinine 1.7 (baseline 1.0), troponin 12> 20, lactic acid normal.  CTA chest negative for PE or any other acute cardiopulmonary abnormality.  CT abdomen pelvis negative for acute findings.  Showing possible signs of early cirrhosis.  CT head negative for acute intracranial abnormality.  EKG showing sinus rhythm with first-degree AV block, LBBB, and new inferolateral T wave inversions compared  to previous EKG from June 2023. Patient was given 1 L normal saline.  TRH called to admit.   Patient states normally he is quite physically active.  He works at a farm and drives race cars.  Goes dancing a few times a week.  Today while he was working at his farm, he did not feel well.  His house [place] about [place] mile away from the farm so he tried to drive himself home.  As he was driving, his vision became blurry and he felt worse, however, managed to reach home and walk inside the house.  He then tried checking his blood pressure and his machine was reading error.  He realized that he had left his cell phone at the farm so he drove back to the farm.  By the time he reached the farm, he was feeling much worse, sweating, nauseous, dizzy, and started having left-sided chest tightness.  He felt like he was going to pass out.  States his boss checked his blood pressure and it was 60/50 so EMS was called.  Patient reports improvement of his symptoms now.  Chest pain has resolved.  He does not take any diuretics at home.  He was also having some mild bilateral lower quadrant abdominal discomfort today but denies any nausea, vomiting, diarrhea, or constipation.  Denies dysuria or urinary frequency/urgency.  Denies regular or heavy alcohol use.  Patient states he has consumed maybe 6 beers total over the past 1 year.  He quit smoking cigarettes 3 years ago.     Hospital Course by Problem:   Chest pain, hypotension, near syncope- suspect due to dehydration EKG showing first-degree  AV block and LBBB which are not new.  However, does show new inferolateral T wave inversions compared to previous EKG from June 2023.  Troponin 12> 20> 24.  Cath done in June 2023 showing mild nonobstructive CAD. Patient is no longer having active chest pain and hypotension has resolved after IV fluids. CTA chest negative for PE or any other acute cardiopulmonary abnormality -orthos +-but after IVF negative -echo: Poor acoustic windows  limit study Endocardium not seen well enough to  determine LVEF and regional wall motion. Would recomm limited echo with  Definity to do this.. Left ventricular diastolic parameters are  indeterminate.  Patient feeling much better and would like to go home  -no need for home O2  AKI -daily labs -improved after IVF   Hypertension -BP improved-- resume home meds   Chronic anemia Hemoglobin stable -outpatient follow up   Abnormal CT finding CT showing possible signs of early cirrhosis.  Patient denies history of alcohol abuse. -outpatient follow up   Chronic HFmrEF Echo done in April 2023 showing EF 50%, mild MVR, mild AVR.  No signs of volume overload at this time.  Repeat echocardiogram as above-- needs repeat outpatient    COPD Stable, no signs of acute exacerbation.  Continue home medications.   COPD Stable, no signs of acute exacerbation.  Continue home medications.   Hyperlipidemia Continue statin.   Hypothyroidism Continue Synthroid     GERD Continue PPI.      Medical Consultants:      Discharge Exam:   Vitals:   11/09/23 0907 11/09/23 1000  BP: (!) 168/92 (!) 176/87  Pulse: 67 (!) 56  Resp: 18 13  Temp:    SpO2: 98% 100%   Vitals:   11/09/23 0603 11/09/23 0847 11/09/23 0907 11/09/23 1000  BP: (!) 163/91  (!) 168/92 (!) 176/87  Pulse: 66  67 (!) 56  Resp: 15  18 13   Temp:  97.9 F (36.6 C)    TempSrc:  Oral    SpO2: 99%  98% 100%  Weight:      Height:        General exam: Appears calm and comfortable  The results of significant diagnostics from this hospitalization (including imaging, microbiology, ancillary and laboratory) are listed below for reference.     Procedures and Diagnostic Studies:   ECHOCARDIOGRAM COMPLETE Result Date: 11/08/2023    ECHOCARDIOGRAM REPORT   Patient Name:   Arthur [name]. Date of Exam: 11/08/2023 Medical Rec #:  [id number]          Height:       72.0 in Accession #:    [id number]         Weight:       253.5  lb Date of Birth:  November 08, 1940         BSA:          2.357 m Patient Age:    82 years           BP:           149/100 mmHg Patient Gender: M                  HR:           65 bpm. Exam Location:  Inpatient Procedure: 2D Echo, Cardiac Doppler and Color Doppler Indications:    Chest pain  History:        Patient has prior history of Echocardiogram examinations, most  recent 01/01/2022. CHF and Cardiomyopathy, COPD, Arrythmias:LBBB,                 Signs/Symptoms:Dizziness/Lightheadedness, Dyspnea, Chest Pain                 and Hypotension; Risk Factors:Hypertension, Dyslipidemia and                 Former Smoker.  Sonographer:    Juanita Shaw Referring Phys: [id number] VASUNDHRA RATHORE IMPRESSIONS  1. Poor acoustic windows limit study Endocardium not seen well enough to determine LVEF and regional wall motion. Would recomm limited echo with Definity to do this.. Left ventricular diastolic parameters are indeterminate.  2. Right ventricular systolic function is normal. The right ventricular size is normal.  3. The mitral valve is normal in structure. Trivial mitral valve regurgitation.  4. The aortic valve is tricuspid. Aortic valve regurgitation is not visualized.  5. The inferior vena cava is normal in size with greater than 50% respiratory variability, suggesting right atrial pressure of 3 mmHg. FINDINGS  Left Ventricle: Poor acoustic windows limit study Endocardium not seen well enough to determine LVEF and regional wall motion. Would recomm limited echo with Definity to do this. The left ventricular internal cavity size was normal in size. There is no left ventricular hypertrophy. Left ventricular diastolic parameters are indeterminate. Right Ventricle: The right ventricular size is normal. Right vetricular wall thickness was not assessed. Right ventricular systolic function is normal. Left Atrium: Left atrial size was normal in size. Right Atrium: Right atrial size was normal in size. Pericardium:  There is no evidence of pericardial effusion. Mitral Valve: The mitral valve is normal in structure. Trivial mitral valve regurgitation. MV peak gradient, 5.7 mmHg. The mean mitral valve gradient is 2.0 mmHg. Tricuspid Valve: The tricuspid valve is normal in structure. Tricuspid valve regurgitation is trivial. Aortic Valve: The aortic valve is tricuspid. Aortic valve regurgitation is not visualized. Aortic valve mean gradient measures 5.0 mmHg. Aortic valve peak gradient measures 10.0 mmHg. Aortic valve area, by VTI measures 2.64 cm. Pulmonic Valve: The pulmonic valve was not well visualized. Pulmonic valve regurgitation is not visualized. Aorta: The aortic root and ascending aorta are structurally normal, with no evidence of dilitation. Venous: The inferior vena cava is normal in size with greater than 50% respiratory variability, suggesting right atrial pressure of 3 mmHg. IAS/Shunts: No atrial level shunt detected by color flow Doppler.  LEFT VENTRICLE PLAX 2D LVIDd:         5.00 cm      Diastology LVIDs:         2.60 cm      LV e' medial:    5.44 cm/s LV PW:         0.90 cm      LV E/e' medial:  10.8 LV IVS:        0.90 cm      LV e' lateral:   7.30 cm/s LVOT diam:     2.10 cm      LV E/e' lateral: 8.1 LV SV:         75 LV SV Index:   32 LVOT Area:     3.46 cm  LV Volumes (MOD) LV vol d, MOD A2C: 228.0 ml LV vol d, MOD A4C: 176.0 ml LV vol s, MOD A2C: 113.0 ml LV vol s, MOD A4C: 84.5 ml LV SV MOD A2C:     115.0 ml LV SV MOD A4C:     176.0 ml LV  SV MOD BP:      102.0 ml RIGHT VENTRICLE             IVC RV Basal diam:  4.00 cm     IVC diam: 1.50 cm RV Mid diam:    2.20 cm RV S prime:     11.80 cm/s TAPSE (M-mode): 2.0 cm LEFT ATRIUM           Index        RIGHT ATRIUM           Index LA diam:      2.80 cm 1.19 cm/m   RA Area:     17.60 cm LA Vol (A2C): 71.5 ml 30.34 ml/m  RA Volume:   44.40 ml  18.84 ml/m LA Vol (A4C): 35.9 ml 15.23 ml/m  AORTIC VALVE                     PULMONIC VALVE AV Area (Vmax):     2.70 cm      PV Vmax:       0.81 m/s AV Area (Vmean):   2.58 cm      PV Peak grad:  2.6 mmHg AV Area (VTI):     2.64 cm AV Vmax:           158.00 cm/s AV Vmean:          103.000 cm/s AV VTI:            0.285 m AV Peak Grad:      10.0 mmHg AV Mean Grad:      5.0 mmHg LVOT Vmax:         123.00 cm/s LVOT Vmean:        76.600 cm/s LVOT VTI:          0.217 m LVOT/AV VTI ratio: 0.76  AORTA Ao Root diam: 3.70 cm Ao Asc diam:  3.40 cm MITRAL VALVE MV Area (PHT): 2.30 cm     SHUNTS MV Area VTI:   2.47 cm     Systemic VTI:  0.22 m MV Peak grad:  5.7 mmHg     Systemic Diam: 2.10 cm MV Mean grad:  2.0 mmHg MV Vmax:       1.19 m/s MV Vmean:      64.8 cm/s MV Decel Time: 330 msec MV E velocity: 58.80 cm/s MV A velocity: 104.00 cm/s MV E/A ratio:  0.57 Vina Gull MD Electronically signed by Vina Gull MD Signature Date/Time: 11/08/2023/3:52:50 PM    Final    CT ABDOMEN PELVIS W CONTRAST Result Date: 11/07/2023 CLINICAL DATA:  Abdominal pain, acute, nonlocalized. EXAM: CT ABDOMEN AND PELVIS WITH CONTRAST TECHNIQUE: Multidetector CT imaging of the abdomen and pelvis was performed using the standard protocol following bolus administration of intravenous contrast. RADIATION DOSE REDUCTION: This exam was performed according to the departmental dose-optimization program which includes automated exposure control, adjustment of the mA and/or kV according to patient size and/or use of iterative reconstruction technique. CONTRAST:  75mL OMNIPAQUE  IOHEXOL  350 MG/ML SOLN COMPARISON:  01/17/2022 FINDINGS: Lower chest: Scarring or atelectasis in the lingula. Hepatobiliary: Previous cholecystectomy. Question early cirrhosis. Prominence of the left lobe with slight lobularity of the liver surface. No focal lesion. Pancreas: Normal Spleen: Normal Adrenals/Urinary Tract: Adrenal glands are normal. Multiple bilateral renal cysts, for which no specific follow-up is recommended. Nonobstructing 7 mm stone in the lower pole of the left kidney. No  hydronephrosis on either side. Low volume bladder without abnormal finding. Stomach/Bowel:  Stomach and small intestine are normal. Normal appendix. Mild diverticulosis of the left colon without visible diverticulitis. Vascular/Lymphatic: Aortic atherosclerosis. No aneurysm. IVC is normal. No adenopathy. Reproductive: Previous prostatectomy. Other: No free fluid or air. Musculoskeletal: Ordinary lumbar degenerative changes. IMPRESSION: 1. No acute finding to explain the clinical presentation. 2. Previous cholecystectomy. Question early cirrhosis. 3. Nonobstructing 7 mm stone in the lower pole of the left kidney. 4. Aortic atherosclerosis. 5. Previous prostatectomy. Electronically Signed   By: Oneil Officer M.D.   On: 11/07/2023 18:40   CT Angio Chest PE W/Cm &/Or Wo Cm Result Date: 11/07/2023 CLINICAL DATA:  Pulmonary embolism suspected.  High probability. EXAM: CT ANGIOGRAPHY CHEST WITH CONTRAST TECHNIQUE: Multidetector CT imaging of the chest was performed using the standard protocol during bolus administration of intravenous contrast. Multiplanar CT image reconstructions and MIPs were obtained to evaluate the vascular anatomy. RADIATION DOSE REDUCTION: This exam was performed according to the departmental dose-optimization program which includes automated exposure control, adjustment of the mA and/or kV according to patient size and/or use of iterative reconstruction technique. CONTRAST:  75mL OMNIPAQUE  IOHEXOL  350 MG/ML SOLN COMPARISON:  Chest radiography same day FINDINGS: Cardiovascular: Heart size upper limits of normal. No pericardial fluid. Coronary artery calcification and aortic atherosclerotic calcification are present. Pulmonary arterial opacification is good. There are no pulmonary emboli. Mediastinum/Nodes: No mass or adenopathy. Lungs/Pleura: No pleural effusion. Mild scarring or atelectasis in the lingula. No sign of acute pneumonia. No mass and no nodule in need of further follow-up. Upper Abdomen:  No acute finding.  Previous cholecystectomy. Musculoskeletal: No acute bone finding. Ordinary spinal degenerative change. Review of the MIP images confirms the above findings. IMPRESSION: 1. No pulmonary emboli. 2. Coronary artery calcification and aortic atherosclerotic calcification. 3. Mild scarring or atelectasis in the lingula. 4. Previous cholecystectomy. 5. Aortic atherosclerosis. Electronically Signed   By: Oneil Officer M.D.   On: 11/07/2023 18:36   CT Head Wo Contrast Result Date: 11/07/2023 CLINICAL DATA:  Dizziness, headache EXAM: CT HEAD WITHOUT CONTRAST TECHNIQUE: Contiguous axial images were obtained from the base of the skull through the vertex without intravenous contrast. RADIATION DOSE REDUCTION: This exam was performed according to the departmental dose-optimization program which includes automated exposure control, adjustment of the mA and/or kV according to patient size and/or use of iterative reconstruction technique. COMPARISON:  12/27/2019 FINDINGS: Brain: No evidence of acute infarction, hemorrhage, mass, mass effect, or midline shift. No hydrocephalus or extra-axial fluid collection. Vascular: No hyperdense vessel. Skull: Negative for fracture or focal lesion. Sinuses/Orbits: Mucosal thickening throughout the paranasal sinuses, most prominently in the ethmoid air cells. No acute finding in the orbits. Status post bilateral lens replacements. Other: Trace fluid in the bilateral mastoid air cells. IMPRESSION: 1. No acute intracranial process. 2. Mucosal thickening throughout the paranasal sinuses, most prominently in the ethmoid air cells. Electronically Signed   By: Donald Campion M.D.   On: 11/07/2023 18:32   DG Chest 2 View Result Date: 11/07/2023 CLINICAL DATA:  dizziness EXAM: CHEST - 2 VIEW COMPARISON:  CT chest 12/15/2022. FINDINGS: The heart and mediastinal contours are within normal limits. Atherosclerotic plaque. Prominent hilar vasculature bilaterally. Hyperinflation of the  lungs. No focal consolidation. Slightly coarsened interstitial markings with no overt pulmonary edema. No pleural effusion. No pneumothorax. No acute osseous abnormality. IMPRESSION: 1. Pulmonary venous congestion.  No frank pulmonary edema. 2. Aortic Atherosclerosis (ICD10-I70.0) and Emphysema (ICD10-J43.9). Electronically Signed   By: Morgane  Naveau M.D.   On: 11/07/2023 16:06  Labs:   Basic Metabolic Panel: Recent Labs  Lab 11/07/23 1430 11/08/23 1038 11/09/23 0304  NA 143 138 139  K 3.8 3.9 3.9  CL 106 104 106  CO2 26 24 25   GLUCOSE 93 142* 112*  BUN 17 19 19   CREATININE 1.75* 1.30* 1.22  CALCIUM  9.3 8.7* 8.5*   GFR Estimated Creatinine Clearance: 62.2 mL/min (by C-G formula based on SCr of 1.22 mg/dL). Liver Function Tests: Recent Labs  Lab 11/07/23 1528  AST 17  ALT 19  ALKPHOS 74  BILITOT 0.7  PROT 6.0*  ALBUMIN 3.2*   Recent Labs  Lab 11/07/23 1528  LIPASE 25   No results for input(s): AMMONIA in the last 168 hours. Coagulation profile No results for input(s): INR, PROTIME in the last 168 hours.  CBC: Recent Labs  Lab 11/07/23 1430 11/08/23 1038 11/09/23 0304  WBC 9.8 5.9 6.9  HGB 12.7* 10.9* 11.3*  HCT 41.2 35.3* 36.5*  MCV 90.9 91.2 90.3  PLT 189 140* 151   Cardiac Enzymes: No results for input(s): CKTOTAL, CKMB, CKMBINDEX, TROPONINI in the last 168 hours. BNP: Invalid input(s): POCBNP CBG: Recent Labs  Lab 11/07/23 1455 11/08/23 1016  GLUCAP 100* 143*   D-Dimer No results for input(s): DDIMER in the last 72 hours. Hgb A1c No results for input(s): HGBA1C in the last 72 hours. Lipid Profile No results for input(s): CHOL, HDL, LDLCALC, TRIG, CHOLHDL, LDLDIRECT in the last 72 hours. Thyroid  function studies Recent Labs    11/09/23 0304  TSH 1.594   Anemia work up No results for input(s): VITAMINB12, FOLATE, FERRITIN, TIBC, IRON, RETICCTPCT in the last 72 hours. Microbiology No  results found for this or any previous visit (from the past 240 hours).   Discharge Instructions:   Discharge Instructions     Diet - low sodium heart healthy   Complete by: As directed    Discharge instructions   Complete by: As directed    Need repeat limited echo with Dr. Delford Monitor skin for rash like zoster-- call PCP if happens Stay hydrated   Increase activity slowly   Complete by: As directed       Allergies as of 11/09/2023       Reactions   Doxycycline Other (See Comments)   Esophagitis, severe gi bleed        Medication List     STOP taking these medications    azithromycin  250 MG tablet Commonly known as: Zithromax  Z-Pak   famotidine  40 MG tablet Commonly known as: Pepcid    fluconazole  100 MG tablet Commonly known as: Diflucan    predniSONE  20 MG tablet Commonly known as: DELTASONE        TAKE these medications    albuterol  108 (90 Base) MCG/ACT inhaler Commonly known as: VENTOLIN  HFA INHALE 1 TO 2 PUFFS INTO THE LUNGS EVERY 6 HOURS AS NEEDED FOR WHEEZING OR SHORTNESS OF BREATH   atorvastatin  40 MG tablet Commonly known as: LIPITOR TAKE 1 TABLET(40 MG) BY MOUTH DAILY   benzonatate  200 MG capsule Commonly known as: TESSALON  Take 1 capsule (200 mg total) by mouth 3 (three) times daily as needed for cough.   carvedilol  6.25 MG tablet Commonly known as: COREG  TAKE 1 TABLET(6.25 MG) BY MOUTH TWICE DAILY WITH A MEAL   cetirizine  10 MG tablet Commonly known as: ZYRTEC  Take 1 tablet (10 mg total) by mouth daily.   cyanocobalamin  1000 MCG tablet Commonly known as: VITAMIN B12 Take 1,000 mcg by mouth. Once a week  Entresto  49-51 MG Generic drug: sacubitril -valsartan  Take 1 tablet by mouth 2 (two) times daily.   fluticasone  50 MCG/ACT nasal spray Commonly known as: FLONASE  Place 2 sprays into both nostrils 2 (two) times daily.   GLUCOSAMINE COMPLEX PO Take by mouth.   levothyroxine  200 MCG tablet Commonly known as: SYNTHROID  TAKE  1 TABLET(200 MCG) BY MOUTH DAILY BEFORE BREAKFAST   pantoprazole  40 MG tablet Commonly known as: PROTONIX  TAKE 1 TABLET(40 MG) BY MOUTH TWICE DAILY   Trelegy Ellipta  100-62.5-25 MCG/ACT Aepb Generic drug: Fluticasone -Umeclidin-Vilant INHALE 1 PUFF INTO THE LUNGS DAILY               Durable Medical Equipment  (From admission, onward)           Start     Ordered   11/09/23 0957  For home use only DME Walker  Once       Question:  Patient needs a walker to treat with the following condition  Answer:  Weakness   11/09/23 0957            Follow-up Information     Arthur [name] HERO, NP Follow up in 1 week(s).   Specialties: Nurse Practitioner, Family Medicine Contact information: [address] [place] [address] [phone]         Delford Maude BROCKS, MD Follow up.   Specialty: Cardiology Why: repeat limited echo with definity Contact information: [address] [place] [address] [phone]                  Time coordinating discharge: 45 min  Signed:  Harlene RAYMOND Bowl DO  [facility] 11/09/2023, 11:03 AM

## 2023-11-09 NOTE — ED Notes (Signed)
 Patient ambulated in the department with primary RN while using walker. Patient was on room air and maintained O2 saturation at 93% and higher throughout ambulation and did not become short of breath or tachypneic.

## 2023-11-09 NOTE — Progress Notes (Signed)
 Hope for d/c later today: -needs home o2 study -orthostatics post AM meds -will need outpatient follow up with cards and limited echo with definity  JV

## 2023-11-09 NOTE — Care Management Obs Status (Signed)
 MEDICARE OBSERVATION STATUS NOTIFICATION   Patient Details  Name: Arthur [name]. MRN: [id number] Date of Birth: [birth year]   Medicare Observation Status Notification Given:  Yes    Joanette Moynahan, RN 11/09/2023, 8:08 AM

## 2023-11-14 ENCOUNTER — Telehealth: Payer: Self-pay | Admitting: Nurse Practitioner

## 2023-11-14 ENCOUNTER — Ambulatory Visit (INDEPENDENT_AMBULATORY_CARE_PROVIDER_SITE_OTHER): Payer: PPO | Admitting: Nurse Practitioner

## 2023-11-14 VITALS — BP 116/72 | HR 61 | Temp 98.3°F | Ht 72.0 in | Wt 255.2 lb

## 2023-11-14 DIAGNOSIS — I10 Essential (primary) hypertension: Secondary | ICD-10-CM

## 2023-11-14 DIAGNOSIS — K760 Fatty (change of) liver, not elsewhere classified: Secondary | ICD-10-CM

## 2023-11-14 DIAGNOSIS — Z09 Encounter for follow-up examination after completed treatment for conditions other than malignant neoplasm: Secondary | ICD-10-CM

## 2023-11-14 LAB — COMPREHENSIVE METABOLIC PANEL
ALT: 22 U/L (ref 0–53)
AST: 21 U/L (ref 0–37)
Albumin: 4 g/dL (ref 3.5–5.2)
Alkaline Phosphatase: 100 U/L (ref 39–117)
BUN: 14 mg/dL (ref 6–23)
CO2: 33 meq/L — ABNORMAL HIGH (ref 19–32)
Calcium: 9.1 mg/dL (ref 8.4–10.5)
Chloride: 103 meq/L (ref 96–112)
Creatinine, Ser: 1.07 mg/dL (ref 0.40–1.50)
GFR: 65.17 mL/min (ref >60.00)
Glucose, Bld: 101 mg/dL — ABNORMAL HIGH (ref 70–99)
Potassium: 4.5 meq/L (ref 3.5–5.1)
Sodium: 141 meq/L (ref 135–145)
Total Bilirubin: 0.6 mg/dL (ref 0.2–1.2)
Total Protein: 6.5 g/dL (ref 6.0–8.3)

## 2023-11-14 LAB — CBC
HCT: 38.9 % — ABNORMAL LOW (ref 39.0–52.0)
Hemoglobin: 12.5 g/dL — ABNORMAL LOW (ref 13.0–17.0)
MCHC: 32.1 g/dL (ref 30.0–36.0)
MCV: 87.7 fL (ref 78.0–100.0)
Platelets: 209 10*3/uL (ref 150.0–400.0)
RBC: 4.44 Mil/uL (ref 4.22–5.81)
RDW: 14.7 % (ref 11.5–15.5)
WBC: 6.5 10*3/uL (ref 4.0–10.5)

## 2023-11-14 NOTE — Telephone Encounter (Signed)
Dr. Leone Payor,  I saw Arthur [name] in office for hospital follow-up.  They did a CT scan of abdomen and pelvis that showed possibility of beginnings of cirrhosis of the liver.  I know he is seen in the past.  Do want to see him in office?Or any dedicated imaging you want me to order before having him follow-up with you?  Thanks,  [employer]

## 2023-11-14 NOTE — Telephone Encounter (Signed)
These question early cirrhosis findings are often not cirrhosis.  He has a history of fatty liver it looks like so he is certainly at risk of that, however.  I recommend that you do abdominal ultrasound with hepatic elastography and see if that answers the question.  That would be quite helpful if you could set that up and then if it does show cirrhosis he could be referred or if there are other questions.  Note that in November he did not have any signs of portal hypertension on his EGD though that does not rule out cirrhosis he certainly would not have anything advanced.

## 2023-11-14 NOTE — Progress Notes (Signed)
   Acute Office Visit  Subjective:     Patient ID: Arthur Reser., male    DOB: 1941-07-04, 83 y.o.   MRN: [id number]  Chief Complaint  Patient presents with   Hospitalization Follow-up    Pt complains of doing pretty well. Has not been dizzy since Hospital visit.     HPI Patient is in today for hospital follow up   Patient was seen in the emergency department up to 425 for dizziness and presyncope.  He was admitted for observation and discharged on 11/09/2023.  During the course they did repeat an echocardiogram that was inconclusive and recommended to follow-up with cardiology for limited echo with the family.  Patient also underwent CT scanThat showed possibilities of cirrhosis and recommended outpatient follow-up.  Patient states that he has been eating and drinking fine he had no more episodes of lightheadedness or dizziness.  He actually back to work yesterday and is able to perform his functions without trouble.  Review of Systems  Constitutional:  Negative for chills and fever.  Respiratory:  Negative for shortness of breath.   Cardiovascular:  Negative for chest pain.  Neurological:  Negative for dizziness and headaches.        Objective:    BP 116/72   Pulse 61   Temp 98.3 F (36.8 C) (Oral)   Ht 6' (1.829 m)   Wt 255 lb 3.2 oz (115.8 kg)   SpO2 94%   BMI 34.61 kg/m    Physical Exam Vitals and nursing note reviewed.  Constitutional:      Appearance: Normal appearance.  Cardiovascular:     Rate and Rhythm: Normal rate and regular rhythm.     Heart sounds: Normal heart sounds.  Pulmonary:     Effort: Pulmonary effort is normal.     Breath sounds: Normal breath sounds.  Abdominal:     General: Bowel sounds are normal.  Neurological:     Mental Status: He is alert.     No results found for any visits on 11/14/23.      Assessment & Plan:   Problem List Items Addressed This Visit       Cardiovascular and Mediastinum   Essential hypertension    Blood pressure normotensive today.  Continue medication as prescribed      Relevant Orders   CBC   Comprehensive metabolic panel     Other   Hospital discharge follow-up - Primary   We reviewed emergency department note and inpatient discharge note along with labs and imaging.      Relevant Orders   CBC   Comprehensive metabolic panel    No orders of the defined types were placed in this encounter.   Return if symptoms worsen or fail to improve, for As scheduled .  Audria Nine, NP

## 2023-11-14 NOTE — Assessment & Plan Note (Signed)
We reviewed emergency department note and inpatient discharge note along with labs and imaging.

## 2023-11-14 NOTE — Patient Instructions (Signed)
Nice to see you today Call Dr. Fabio Bering office and schedule an appointment with him I will reach out to Dr. Marvell Fuller office and we will let you know next steps  Follow up with me as scheduled in April

## 2023-11-14 NOTE — Assessment & Plan Note (Signed)
Blood pressure normotensive today.  Continue medication as prescribed

## 2023-11-15 NOTE — Telephone Encounter (Signed)
Thanks for the advice I ordered the [place] with elastography and will touch base with you once I have the results

## 2023-11-16 ENCOUNTER — Encounter: Payer: Self-pay | Admitting: Nurse Practitioner

## 2023-11-16 NOTE — Telephone Encounter (Signed)
Copied from CRM [id number]. Topic: Clinical - Lab/Test Results >> Nov 16, 2023  8:43 AM Arthur [name] wrote: Reason for CRM: Patient notified of lab results. Has no further questions. He is asking to please contact him directly via telephone as he is unable to retrieve his test results in mychart.

## 2023-11-22 ENCOUNTER — Ambulatory Visit
Admission: RE | Admit: 2023-11-22 | Discharge: 2023-11-22 | Disposition: A | Discharge status: Home / Self Care | Payer: PPO | Source: Ambulatory Visit | Attending: Nurse Practitioner | Admitting: Nurse Practitioner

## 2023-11-22 DIAGNOSIS — N281 Cyst of kidney, acquired: Secondary | ICD-10-CM | POA: Diagnosis not present

## 2023-11-22 DIAGNOSIS — K760 Fatty (change of) liver, not elsewhere classified: Secondary | ICD-10-CM | POA: Insufficient documentation

## 2023-11-22 DIAGNOSIS — K838 Other specified diseases of biliary tract: Secondary | ICD-10-CM | POA: Diagnosis not present

## 2023-11-22 DIAGNOSIS — Z9049 Acquired absence of other specified parts of digestive tract: Secondary | ICD-10-CM | POA: Diagnosis not present

## 2023-11-22 DIAGNOSIS — I714 Abdominal aortic aneurysm, without rupture, unspecified: Secondary | ICD-10-CM | POA: Diagnosis not present

## 2023-11-26 ENCOUNTER — Encounter: Payer: Self-pay | Admitting: Nurse Practitioner

## 2023-11-30 ENCOUNTER — Ambulatory Visit: Payer: Self-pay | Admitting: Nurse Practitioner

## 2023-11-30 NOTE — Telephone Encounter (Signed)
 Copied from CRM [id number]. Topic: Clinical - Red Word Triage >> Nov 30, 2023 11:11 AM Marica Otter wrote: [facility] that prompted transfer to Nurse Triage: Patient having severe left lower back and stomach pain  Chief Complaint: Left flank pain. Symptoms: Left flank pain, radiating to left lower stomach, bruning with urination Frequency: constant Pertinent Negatives: Patient denies blood in urine Disposition: [x] ED /[] Urgent Care (no appt availability in office) / [] Appointment(In office/virtual)/ []  [facility]/ [] Home Care/ [] Refused Recommended Disposition /[] [facility]/ []  Follow-up with PCP Additional Notes: Pt rports left flank pain that radiates to  left lower stomach, pt also reports burning and pain with urination. Pt endorses 8/10 pain and that he is concerned that its a kidney stone. RN advising ED, pt agreeable sts that he will go to [place].   Reason for Disposition  [1] SEVERE pain (e.g., excruciating, scale 8-10) AND [2] present > 1 hour  Answer Assessment - Initial Assessment Questions 1. LOCATION: "Where does it hurt?" (e.g., left, right)     Left   2. ONSET: "When did the pain start?"     Started a couple of months, getting worse last 2-3 days  3. SEVERITY: "How bad is the pain?" (e.g., Scale 1-10; mild, moderate, or severe)   - MILD (1-3): doesn't interfere with normal activities    - MODERATE (4-7): interferes with normal activities or awakens from sleep    - SEVERE (8-10): excruciating pain and patient unable to do normal activities (stays in bed)       8/10 pain  4. PATTERN: "Does the pain come and go, or is it constant?"      Constant, fluctuates in intensity  5. CAUSE: "What do you think is causing the pain?"     Think this may be a kidney stone, that was getting bigger  6. OTHER SYMPTOMS:  "Do you have any other symptoms?" (e.g., fever, abdomen pain, vomiting, leg weakness, burning with urination, blood in urine)     Occasionally burning  and pain with urination, lower abd pain on left  Protocols used: Flank Pain-A-AH

## 2023-11-30 NOTE — Telephone Encounter (Signed)
 Agree with disposition.

## 2023-12-06 ENCOUNTER — Other Ambulatory Visit (INDEPENDENT_AMBULATORY_CARE_PROVIDER_SITE_OTHER): Payer: Self-pay | Admitting: Otolaryngology

## 2023-12-14 ENCOUNTER — Telehealth: Payer: Self-pay | Admitting: Adult Health

## 2023-12-14 NOTE — Telephone Encounter (Signed)
 No he doesn't need, thanks for seeing that . He can discuss with Dr. Vassie Loll  on follow up visit Make sure he keeps follow up .

## 2023-12-14 NOTE — Telephone Encounter (Signed)
 Tammy you saw this patient and he had CT done 12/15/22 you placed an order to do 1 year follow up CT. The patient had CTA done 11/07/23 does he still need to have another CT for 12/2023

## 2023-12-21 ENCOUNTER — Other Ambulatory Visit: Payer: Self-pay | Admitting: Nurse Practitioner

## 2023-12-21 DIAGNOSIS — R09A2 Foreign body sensation, throat: Secondary | ICD-10-CM

## 2024-01-01 ENCOUNTER — Ambulatory Visit (HOSPITAL_BASED_OUTPATIENT_CLINIC_OR_DEPARTMENT_OTHER): Payer: PPO | Admitting: Pulmonary Disease

## 2024-01-01 ENCOUNTER — Encounter (HOSPITAL_BASED_OUTPATIENT_CLINIC_OR_DEPARTMENT_OTHER): Payer: Self-pay | Admitting: Pulmonary Disease

## 2024-01-01 VITALS — BP 126/84 | HR 81 | Ht 72.0 in | Wt 250.8 lb

## 2024-01-01 DIAGNOSIS — J441 Chronic obstructive pulmonary disease with (acute) exacerbation: Secondary | ICD-10-CM

## 2024-01-01 MED ORDER — PREDNISONE 10 MG PO TABS: ORAL_TABLET | ORAL | 0 refills | Status: DC

## 2024-01-01 MED ORDER — AMOXICILLIN-POT CLAVULANATE 875-125 MG PO TABS: 1.0000 | ORAL_TABLET | Freq: Two times a day (BID) | ORAL | 0 refills | Status: DC

## 2024-01-01 NOTE — Progress Notes (Signed)
 Subjective:    Patient ID: Arthur [name]., male    DOB: June 18, 1941, 83 y.o.   MRN: [id number]  HPI  83 yo smoker , followed for COPD and stable pulmonary nodules   PMH -HFrEF (EF 35 % ) , has recovered He races modified cars as a hobby   12/27/2021-01/02/2022: Hospitalization for AECOPD, failed outpatient treatment. Treated with IV steroids and PO levaquin. Initially required O2 therapy but was weaned.   6 month FU visit The patient, with a history of COPD, systolic heart failure, and reflux, presents with a new respiratory infection. He reports that his girlfriend was ill with a fever and cough the day prior, and he woke up with similar symptoms. He describes his sputum as a 'gray blob.' He denies fever.  His heart failure is well controlled, with no reported symptoms. His reflux is generally well managed, but he occasionally has symptoms if he eats something 'out of line.' He had an endoscopy last year with Dr. Leone Payor.  He also reports a recent hospitalization for hypotension, with a blood pressure of 60/50. He spent three days in the emergency room, during which he had a CT scan and x-rays of his chest to rule out blood clots. The scans were negative, and previous nodules were no longer present. He also had an ultrasound of his liver in [place], which was reportedly normal.   Significant tests/ events reviewed   2D Echo 05/2016 Moderate to severe global reduction in LV function; mild LVH;   grade 1 diastolic dysfunction with elevated LV filling pressure;   mild biatrial enlargement; trace AI, MR and TR. EF 30-35%   3/2022EF improved to 52%    Spirometry 2016 >FEV1 66% , ratio 63.    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   Review of Systems neg for any significant sore throat, dysphagia, itching, sneezing, nasal congestion or excess/ purulent secretions, fever, chills, sweats, unintended wt loss, pleuritic or exertional cp, hempoptysis, orthopnea pnd or change in chronic leg swelling. Also denies presyncope, palpitations, heartburn, abdominal pain, nausea, vomiting, diarrhea or change in bowel or urinary habits, dysuria,hematuria, rash, arthralgias, visual complaints, headache, numbness weakness or ataxia.     Objective:   Physical Exam  Gen. Pleasant, obese, in no distress ENT - no lesions, no post nasal drip Neck: No JVD, no thyromegaly, no carotid bruits Lungs: no use of accessory muscles, no dullness to percussion, decreased without rales or rhonchi  Cardiovascular: Rhythm regular, heart sounds  normal, no murmurs or gallops, no peripheral edema Musculoskeletal: No deformities, no cyanosis or clubbing , no tremors       Assessment & Plan:   Chronic Obstructive Pulmonary Disease (COPD) exacerbation Symptoms include cough and gray sputum production, suggesting an infection rather than an allergic reaction. He denies fever, but his girlfriend has similar symptoms, including fever and cough. An infection is suspected. - Prescribe Augmentin - Prescribe prednisone 40 mg with a taper over two weeks - Advise COVID and flu testing if fever develops, although he is reluctant to undergo testing  Chronic Systolic Heart Failure His condition is well-managed with no current issues. Previous cardiology follow-ups were satisfactory.  Gastroesophageal Reflux Disease (GERD) GERD occasionally flares up depending on dietary choices. An endoscopy last year with Dr. Leone Payor showed no new concerns.

## 2024-01-01 NOTE — Patient Instructions (Signed)
 Prednisone 10 mg tabs Take 4 tabs  daily with food x 4 days, then 3 tabs daily x 4 days, then 2 tabs daily x 4 days, then 1 tab daily x4 days then stop. #40   Augmentin bid x 7 days

## 2024-01-11 ENCOUNTER — Encounter: Payer: Self-pay | Admitting: Nurse Practitioner

## 2024-01-11 ENCOUNTER — Ambulatory Visit: Payer: PPO | Admitting: Nurse Practitioner

## 2024-01-11 VITALS — BP 138/74 | HR 60 | Temp 98.6°F | Ht 71.0 in | Wt 245.2 lb

## 2024-01-11 DIAGNOSIS — Z131 Encounter for screening for diabetes mellitus: Secondary | ICD-10-CM

## 2024-01-11 DIAGNOSIS — J449 Chronic obstructive pulmonary disease, unspecified: Secondary | ICD-10-CM | POA: Diagnosis not present

## 2024-01-11 DIAGNOSIS — Z0000 Encounter for general adult medical examination without abnormal findings: Secondary | ICD-10-CM

## 2024-01-11 DIAGNOSIS — Z87891 Personal history of nicotine dependence: Secondary | ICD-10-CM | POA: Diagnosis not present

## 2024-01-11 DIAGNOSIS — E039 Hypothyroidism, unspecified: Secondary | ICD-10-CM | POA: Diagnosis not present

## 2024-01-11 DIAGNOSIS — Z125 Encounter for screening for malignant neoplasm of prostate: Secondary | ICD-10-CM

## 2024-01-11 DIAGNOSIS — I10 Essential (primary) hypertension: Secondary | ICD-10-CM

## 2024-01-11 DIAGNOSIS — I5042 Chronic combined systolic (congestive) and diastolic (congestive) heart failure: Secondary | ICD-10-CM | POA: Diagnosis not present

## 2024-01-11 DIAGNOSIS — I700 Atherosclerosis of aorta: Secondary | ICD-10-CM | POA: Diagnosis not present

## 2024-01-11 LAB — HEMOGLOBIN A1C: Hgb A1c MFr Bld: 6.4 % (ref 4.6–6.5)

## 2024-01-11 LAB — LIPID PANEL
Cholesterol: 103 mg/dL (ref 0–200)
HDL: 28.9 mg/dL — ABNORMAL LOW (ref >39.00)
LDL Cholesterol: 54 mg/dL (ref 0–99)
NonHDL: 74.17
Total CHOL/HDL Ratio: 4
Triglycerides: 103 mg/dL (ref 0.0–149.0)
VLDL: 20.6 mg/dL (ref 0.0–40.0)

## 2024-01-11 LAB — COMPREHENSIVE METABOLIC PANEL WITH GFR
ALT: 13 U/L (ref 0–53)
AST: 15 U/L (ref 0–37)
Albumin: 4 g/dL (ref 3.5–5.2)
Alkaline Phosphatase: 99 U/L (ref 39–117)
BUN: 22 mg/dL (ref 6–23)
CO2: 34 meq/L — ABNORMAL HIGH (ref 19–32)
Calcium: 9.1 mg/dL (ref 8.4–10.5)
Chloride: 101 meq/L (ref 96–112)
Creatinine, Ser: 1.18 mg/dL (ref 0.40–1.50)
GFR: 57.88 mL/min — ABNORMAL LOW (ref >60.00)
Glucose, Bld: 117 mg/dL — ABNORMAL HIGH (ref 70–99)
Potassium: 4.3 meq/L (ref 3.5–5.1)
Sodium: 143 meq/L (ref 135–145)
Total Bilirubin: 0.7 mg/dL (ref 0.2–1.2)
Total Protein: 6.3 g/dL (ref 6.0–8.3)

## 2024-01-11 LAB — CBC
HCT: 38 % — ABNORMAL LOW (ref 39.0–52.0)
Hemoglobin: 12.2 g/dL — ABNORMAL LOW (ref 13.0–17.0)
MCHC: 32.2 g/dL (ref 30.0–36.0)
MCV: 83.7 fl (ref 78.0–100.0)
Platelets: 241 10*3/uL (ref 150.0–400.0)
RBC: 4.53 Mil/uL (ref 4.22–5.81)
RDW: 14.2 % (ref 11.5–15.5)
WBC: 7.3 10*3/uL (ref 4.0–10.5)

## 2024-01-11 LAB — PSA, MEDICARE: PSA: 0 ng/mL — ABNORMAL LOW (ref 0.10–4.00)

## 2024-01-11 LAB — TSH: TSH: 4.49 u[IU]/mL (ref 0.35–5.50)

## 2024-01-11 NOTE — Progress Notes (Signed)
 Established Patient Office Visit  Subjective   Patient ID: Arthur Peter., male    DOB: 14-Oct-1940  Age: 83 y.o. MRN: [id number]  Chief Complaint  Patient presents with   Annual Exam    HPI  HTN: carvedilol, entresto every 6 months with cardiology. Does check blood pressure at home. Somes daily to weekly   CHF: Carvedilol and entresto.  Patient is followed by Charlton Haws, MD cardiologist  COPD: on trelegy and albuterol as needed. He is followed by pulmonlogy. He will use the albuterol regular with the season changes   GERD: patient is maintained on protonix 40mg  BID and is followed by GI.  Patient recently had an endoscopy with esophageal stretching  Hypthyroidism: levothyroxine  for complete physical and follow up of chronic conditions.  Immunizations: -Tetanus: Completed in 2011 -Influenza: 06/16/2023 -Shingles: Completed Shingrix series -Pneumonia: Completed   Diet: Fair diet. 1 meal and snacks a lotl He will drink water 1 coke, Does some coffee Exercise: No regular exercise. Work, walking, and dancing   Eye exam: Completes annually.   Dental exam: Full set of dentures and see them PRN   Colonoscopy: Completed in 2019, patient aged out Lung Cancer Screening: Aged out  PSA: Due  Sleep:goes to bed around 10 and will get up around 4 and then will lay back down. Does feel rested sometimes  Advance directive: Does not have      Review of Systems  Constitutional:  Negative for chills and fever.  Respiratory:  Positive for shortness of breath.   Cardiovascular:  Negative for chest pain and leg swelling.  Gastrointestinal:  Negative for abdominal pain, blood in stool, constipation, diarrhea, nausea and vomiting.       BM daily   Genitourinary:  Negative for dysuria and hematuria.  Neurological:  Negative for dizziness, tingling and headaches.  Psychiatric/Behavioral:  Negative for hallucinations and suicidal ideas.       Objective:     BP  138/74   Pulse 60   Temp 98.6 F (37 C) (Oral)   Ht 5\' 11"  (1.803 m)   Wt 245 lb 3.2 oz (111.2 kg)   SpO2 93%   BMI 34.20 kg/m  BP Readings from Last 3 Encounters:  01/11/24 138/74  01/01/24 126/84  11/14/23 116/72   Wt Readings from Last 3 Encounters:  01/11/24 245 lb 3.2 oz (111.2 kg)  01/01/24 250 lb 12.8 oz (113.8 kg)  11/14/23 255 lb 3.2 oz (115.8 kg)   SpO2 Readings from Last 3 Encounters:  01/11/24 93%  01/01/24 94%  11/14/23 94%      Physical Exam Vitals and nursing note reviewed.  Constitutional:      Appearance: Normal appearance.  HENT:     Right Ear: Tympanic membrane, ear canal and external ear normal.     Left Ear: Tympanic membrane, ear canal and external ear normal.     Mouth/Throat:     Mouth: Mucous membranes are moist.     Pharynx: Oropharynx is clear.  Eyes:     Extraocular Movements: Extraocular movements intact.     Pupils: Pupils are equal, round, and reactive to light.  Cardiovascular:     Rate and Rhythm: Normal rate and regular rhythm.     Pulses: Normal pulses.     Heart sounds: Normal heart sounds.  Pulmonary:     Effort: Pulmonary effort is normal.     Breath sounds: Normal breath sounds.  Abdominal:     General: Bowel sounds  are normal. There is no distension.     Palpations: There is no mass.     Tenderness: There is no abdominal tenderness.     Hernia: No hernia is present.  Musculoskeletal:     Right lower leg: No edema.     Left lower leg: No edema.  Lymphadenopathy:     Cervical: No cervical adenopathy.  Skin:    General: Skin is warm.  Neurological:     General: No focal deficit present.     Mental Status: He is alert.     Deep Tendon Reflexes:     Reflex Scores:      Bicep reflexes are 2+ on the right side and 2+ on the left side.      Patellar reflexes are 2+ on the right side and 2+ on the left side.    Comments: Bilateral upper and lower extremity strength 5/5  Psychiatric:        Mood and Affect: Mood normal.         Behavior: Behavior normal.        Thought Content: Thought content normal.        Judgment: Judgment normal.      No results found for any visits on 01/11/24.    The ASCVD Risk score (Arnett DK, et al., 2019) failed to calculate for the following reasons:   The 2019 ASCVD risk score is only valid for ages 51 to 73    Assessment & Plan:   Problem List Items Addressed This Visit       Cardiovascular and Mediastinum   Essential hypertension   Patient currently maintained on carvedilol and Entresto.  He is followed by cardiology.  Blood pressure within limits.  Continue medication as prescribed      Relevant Orders   CBC   Comprehensive metabolic panel with GFR   Hemoglobin A1c   TSH   Lipid panel   Aortic atherosclerosis (HCC)   Patient currently maintained on atorvastatin 40 mg daily.  Continue taking medication as prescribed pending lipid panel today      Relevant Orders   Lipid panel   Chronic combined systolic and diastolic CHF (congestive heart failure) (HCC)   Patient currently on carvedilol and Entresto.  Followed by cardiology continue medications prescribed continue following cardiology as recommended      Relevant Orders   Lipid panel     Respiratory   COPD (chronic obstructive pulmonary disease) (HCC)   Patient currently maintained on Trelegy and albuterol inhaler as needed.  He is followed by pulmonology.  Continue taking medication as prescribed follow-up with pulmonology as recommended        Endocrine   Hypothyroidism (acquired)   History of same patient currently maintained on levothyroxine 200 mcg daily.  Continue medication as prescribed pending thyroid functions today      Relevant Orders   TSH     Other   Preventative health care - Primary   Discussed age-appropriate immunizations and screening exams.  Did review patient's personal, surgical, social, family histories.  Patient is up-to-date on all age-appropriate vaccinations he  would like.  Patient is aged out for [facility] screening.  PSA for prostate cancer screening today.  Patient was given information at discharge about preventative healthcare maintenance with anticipatory guidance.      Former smoker   Does not qualify for LDCT due to age.  Will check urine microscopy rule out microscopic hematuria.      Relevant Orders   Urine  Microscopic   Other Visit Diagnoses       Screening for prostate cancer       Relevant Orders   PSA, Medicare     Screening for diabetes mellitus       Relevant Orders   Hemoglobin A1c       Return in about 6 months (around 07/12/2024) for BP recheck.    Audria Nine, NP

## 2024-01-11 NOTE — Assessment & Plan Note (Signed)
 Patient currently maintained on Trelegy and albuterol inhaler as needed.  He is followed by pulmonology.  Continue taking medication as prescribed follow-up with pulmonology as recommended

## 2024-01-11 NOTE — Patient Instructions (Signed)
Nice to see you today I will be in touch with the labs once I have reviewed them Follow up with me in 6 months, sooner if you need me

## 2024-01-11 NOTE — Assessment & Plan Note (Signed)
 History of same patient currently maintained on levothyroxine 200 mcg daily.  Continue medication as prescribed pending thyroid functions today

## 2024-01-11 NOTE — Assessment & Plan Note (Signed)
 Does not qualify for LDCT due to age.  Will check urine microscopy rule out microscopic hematuria.

## 2024-01-11 NOTE — Assessment & Plan Note (Signed)
 Patient currently on carvedilol and Entresto.  Followed by cardiology continue medications prescribed continue following cardiology as recommended

## 2024-01-11 NOTE — Assessment & Plan Note (Signed)
 Discussed age-appropriate immunizations and screening exams.  Did review patient's personal, surgical, social, family histories.  Patient is up-to-date on all age-appropriate vaccinations he would like.  Patient is aged out for [facility] screening.  PSA for prostate cancer screening today.  Patient was given information at discharge about preventative healthcare maintenance with anticipatory guidance.

## 2024-01-11 NOTE — Assessment & Plan Note (Signed)
 Patient currently maintained on atorvastatin 40 mg daily.  Continue taking medication as prescribed pending lipid panel today

## 2024-01-11 NOTE — Assessment & Plan Note (Signed)
 Patient currently maintained on carvedilol and Entresto.  He is followed by cardiology.  Blood pressure within limits.  Continue medication as prescribed

## 2024-01-12 ENCOUNTER — Encounter: Payer: Self-pay | Admitting: Nurse Practitioner

## 2024-01-12 ENCOUNTER — Other Ambulatory Visit: Payer: Self-pay

## 2024-01-12 DIAGNOSIS — Z87891 Personal history of nicotine dependence: Secondary | ICD-10-CM

## 2024-01-12 LAB — URINALYSIS, MICROSCOPIC ONLY

## 2024-01-16 ENCOUNTER — Other Ambulatory Visit (HOSPITAL_BASED_OUTPATIENT_CLINIC_OR_DEPARTMENT_OTHER): Payer: Self-pay | Admitting: Pulmonary Disease

## 2024-01-25 ENCOUNTER — Other Ambulatory Visit: Payer: Self-pay | Admitting: Nurse Practitioner

## 2024-02-13 ENCOUNTER — Ambulatory Visit (INDEPENDENT_AMBULATORY_CARE_PROVIDER_SITE_OTHER): Payer: PPO

## 2024-02-13 VITALS — Ht 72.0 in | Wt 245.0 lb

## 2024-02-13 DIAGNOSIS — Z0000 Encounter for general adult medical examination without abnormal findings: Secondary | ICD-10-CM

## 2024-02-13 NOTE — Patient Instructions (Signed)
 Arthur [name] , Thank you for taking time out of your busy schedule to complete your Annual Wellness Visit with me. I enjoyed our conversation and look forward to speaking with you again next year. I, as well as your care team,  appreciate your ongoing commitment to your health goals. Please review the following plan we discussed and let me know if I can assist you in the future. Your Game plan/ To Do List     Follow up Visits: Next Medicare AWV with our clinical staff: 02/13/25 @ 10:50am televisit   Have you seen your provider in the last 6 months (3 months if uncontrolled diabetes)? Yes Next Office Visit with your provider: 07/12/24  Clinician Recommendations:  Aim for 30 minutes of exercise or brisk walking, 6-8 glasses of water, and 5 servings of fruits and vegetables each day.       This is a list of the screening recommended for you and due dates:  Health Maintenance  Topic Date Due   DTaP/Tdap/Td vaccine (2 - Td or Tdap) 04/26/2020   COVID-19 Vaccine (3 - Moderna risk series) 11/13/2024*   Flu Shot  05/03/2024   Medicare Annual Wellness Visit  02/12/2025   Pneumonia Vaccine  Completed   Zoster (Shingles) Vaccine  Completed   HPV Vaccine  Aged Out   Meningitis B Vaccine  Aged Out  *Topic was postponed. The date shown is not the original due date.    Advanced directives: (Declined) Advance directive discussed with you today. Even though you declined this today, please call our office should you change your mind, and we can give you the proper paperwork for you to fill out. Advance Care Planning is important because it:  [x]  Makes sure you receive the medical care that is consistent with your values, goals, and preferences [x]  It provides guidance to your family and loved ones and reduces their decisional burden about whether or not they are making the right decisions based on your wishes.  Follow the link provided in your after visit summary or read over the paperwork we have mailed  to you to help you started getting your Advance Directives in place. If you need assistance in completing these, please reach out to us  so that we can help you!
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
## 2024-02-18 ENCOUNTER — Other Ambulatory Visit: Payer: Self-pay | Admitting: Cardiovascular Disease

## 2024-03-16 ENCOUNTER — Other Ambulatory Visit: Payer: Self-pay | Admitting: Cardiovascular Disease

## 2024-03-16 DIAGNOSIS — I10 Essential (primary) hypertension: Secondary | ICD-10-CM

## 2024-04-01 ENCOUNTER — Ambulatory Visit (INDEPENDENT_AMBULATORY_CARE_PROVIDER_SITE_OTHER): Payer: PPO | Admitting: Otolaryngology

## 2024-04-01 ENCOUNTER — Encounter (INDEPENDENT_AMBULATORY_CARE_PROVIDER_SITE_OTHER): Payer: Self-pay | Admitting: Otolaryngology

## 2024-04-01 VITALS — BP 113/53 | HR 71

## 2024-04-01 DIAGNOSIS — R609 Edema, unspecified: Secondary | ICD-10-CM

## 2024-04-01 DIAGNOSIS — H9190 Unspecified hearing loss, unspecified ear: Secondary | ICD-10-CM | POA: Diagnosis not present

## 2024-04-01 DIAGNOSIS — R490 Dysphonia: Secondary | ICD-10-CM | POA: Diagnosis not present

## 2024-04-01 DIAGNOSIS — J3089 Other allergic rhinitis: Secondary | ICD-10-CM

## 2024-04-01 DIAGNOSIS — K219 Gastro-esophageal reflux disease without esophagitis: Secondary | ICD-10-CM

## 2024-04-01 DIAGNOSIS — J383 Other diseases of vocal cords: Secondary | ICD-10-CM

## 2024-04-01 DIAGNOSIS — J381 Polyp of vocal cord and larynx: Secondary | ICD-10-CM

## 2024-04-01 MED ORDER — CHLORHEXIDINE GLUCONATE 0.12 % MT SOLN: 15.0000 mL | Freq: Two times a day (BID) | OROMUCOSAL | 0 refills | Status: DC
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
## 2024-04-29 ENCOUNTER — Other Ambulatory Visit: Payer: Self-pay | Admitting: Nurse Practitioner

## 2024-04-29 DIAGNOSIS — E039 Hypothyroidism, unspecified: Secondary | ICD-10-CM

## 2024-05-02 ENCOUNTER — Emergency Department (HOSPITAL_COMMUNITY)
Admission: EM | Admit: 2024-05-02 | Discharge: 2024-05-02 | Disposition: A | Discharge status: Home / Self Care | Attending: Emergency Medicine | Admitting: Emergency Medicine

## 2024-05-02 ENCOUNTER — Ambulatory Visit: Payer: Self-pay

## 2024-05-02 ENCOUNTER — Other Ambulatory Visit: Payer: Self-pay

## 2024-05-02 ENCOUNTER — Emergency Department (HOSPITAL_COMMUNITY)

## 2024-05-02 ENCOUNTER — Encounter (HOSPITAL_COMMUNITY): Payer: Self-pay

## 2024-05-02 DIAGNOSIS — R42 Dizziness and giddiness: Secondary | ICD-10-CM

## 2024-05-02 DIAGNOSIS — E860 Dehydration: Secondary | ICD-10-CM | POA: Diagnosis not present

## 2024-05-02 DIAGNOSIS — R197 Diarrhea, unspecified: Secondary | ICD-10-CM | POA: Diagnosis not present

## 2024-05-02 DIAGNOSIS — Z7951 Long term (current) use of inhaled steroids: Secondary | ICD-10-CM | POA: Diagnosis not present

## 2024-05-02 DIAGNOSIS — I959 Hypotension, unspecified: Secondary | ICD-10-CM | POA: Diagnosis not present

## 2024-05-02 DIAGNOSIS — I447 Left bundle-branch block, unspecified: Secondary | ICD-10-CM | POA: Diagnosis not present

## 2024-05-02 DIAGNOSIS — I10 Essential (primary) hypertension: Secondary | ICD-10-CM | POA: Insufficient documentation

## 2024-05-02 DIAGNOSIS — I700 Atherosclerosis of aorta: Secondary | ICD-10-CM | POA: Diagnosis not present

## 2024-05-02 DIAGNOSIS — R0602 Shortness of breath: Secondary | ICD-10-CM | POA: Diagnosis not present

## 2024-05-02 DIAGNOSIS — R55 Syncope and collapse: Secondary | ICD-10-CM | POA: Insufficient documentation

## 2024-05-02 DIAGNOSIS — Z981 Arthrodesis status: Secondary | ICD-10-CM | POA: Diagnosis not present

## 2024-05-02 DIAGNOSIS — Z79899 Other long term (current) drug therapy: Secondary | ICD-10-CM | POA: Insufficient documentation

## 2024-05-02 DIAGNOSIS — J449 Chronic obstructive pulmonary disease, unspecified: Secondary | ICD-10-CM | POA: Diagnosis not present

## 2024-05-02 DIAGNOSIS — I771 Stricture of artery: Secondary | ICD-10-CM | POA: Diagnosis not present

## 2024-05-02 DIAGNOSIS — R062 Wheezing: Secondary | ICD-10-CM | POA: Diagnosis not present

## 2024-05-02 LAB — URINALYSIS, ROUTINE W REFLEX MICROSCOPIC
Bilirubin Urine: NEGATIVE
Glucose, UA: NEGATIVE mg/dL
Hgb urine dipstick: NEGATIVE
Ketones, ur: NEGATIVE mg/dL
Leukocytes,Ua: NEGATIVE
Nitrite: NEGATIVE
Protein, ur: 30 mg/dL — AB
Specific Gravity, Urine: 1.012 (ref 1.005–1.030)
pH: 5 (ref 5.0–8.0)

## 2024-05-02 LAB — CBC
HCT: 38.6 % — ABNORMAL LOW (ref 39.0–52.0)
Hemoglobin: 12.2 g/dL — ABNORMAL LOW (ref 13.0–17.0)
MCH: 27.7 pg (ref 26.0–34.0)
MCHC: 31.6 g/dL (ref 30.0–36.0)
MCV: 87.7 fL (ref 80.0–100.0)
Platelets: 178 K/uL (ref 150–400)
RBC: 4.4 MIL/uL (ref 4.22–5.81)
RDW: 15 % (ref 11.5–15.5)
WBC: 8.4 K/uL (ref 4.0–10.5)
nRBC: 0 % (ref 0.0–0.2)

## 2024-05-02 LAB — BASIC METABOLIC PANEL WITH GFR
Anion gap: 6 (ref 5–15)
BUN: 20 mg/dL (ref 8–23)
CO2: 26 mmol/L (ref 22–32)
Calcium: 9.4 mg/dL (ref 8.9–10.3)
Chloride: 107 mmol/L (ref 98–111)
Creatinine, Ser: 1.62 mg/dL — ABNORMAL HIGH (ref 0.61–1.24)
GFR, Estimated: 42 mL/min — ABNORMAL LOW (ref >60)
Glucose, Bld: 113 mg/dL — ABNORMAL HIGH (ref 70–99)
Potassium: 4.7 mmol/L (ref 3.5–5.1)
Sodium: 139 mmol/L (ref 135–145)

## 2024-05-02 LAB — CK: Total CK: 43 U/L — ABNORMAL LOW (ref 49–397)

## 2024-05-02 MED ORDER — LACTATED RINGERS IV BOLUS
1000.0000 mL | Freq: Once | INTRAVENOUS | Status: AC
  Administered 2024-05-02: 1000 mL via INTRAVENOUS

## 2024-05-02 NOTE — ED Triage Notes (Signed)
 Pt arrives to ED via EMS from home after mowing the lawn and became very short of breath, EMS heard diminished lung sounds throughout. EMS albuterol  treatment with significant change in lung sounds.

## 2024-05-02 NOTE — ED Provider Notes (Signed)
 [name] [facility]MENT AT [facility] Provider Note   CSN: [id number] Arrival date & time: 05/02/24  1247     Patient presents with: Shortness of Breath   Arthur [name]. is a 83 y.o. male with PMH of COPD, HLD, HTN, and GERD.    The patient presents today with shortness of breath and pre-syncope. He reports that he was mowing his lawn and began seeing spots in his vision and felt like he was going to pass out. He went into the house and checked his BP which was 80's/60's. At that time, he called EMS. He also reports shortness of breath stating that his feels like he cannot take a full breath. He was given albuterol  in the ambulance with resolution of wheezing and improved symptoms. As of now, he still feels slightly short of breath, but less lightheaded. He does endorse frequent diarrhea, but this has been a chronic issue since his gallbladder was removed. He denies CP, vomiting, abd pain, and diuretic use.    Shortness of Breath Associated symptoms: no abdominal pain, no chest pain, no fever and no vomiting        Prior to Admission medications   Medication Sig Start Date End Date Taking? Authorizing Provider  albuterol  (VENTOLIN  HFA) 108 (90 Base) MCG/ACT inhaler INHALE 1 TO 2 PUFFS INTO THE LUNGS EVERY 6 HOURS AS NEEDED FOR WHEEZING OR SHORTNESS OF BREATH Patient taking differently: Inhale 1 puff into the lungs every 6 (six) hours as needed for shortness of breath. 06/09/23  Yes Parrett, Tammy S, NP  atorvastatin  (LIPITOR) 40 MG tablet TAKE 1 TABLET(40 MG) BY MOUTH DAILY 10/31/23  Yes Delford Maude BROCKS, MD  carvedilol  (COREG ) 6.25 MG tablet TAKE 1 TABLET(6.25 MG) BY MOUTH TWICE DAILY WITH A MEAL 03/18/24  Yes Delford Maude BROCKS, MD  cetirizine  (ZYRTEC ) 10 MG tablet Take 1 tablet (10 mg total) by mouth daily. 08/21/23  Yes Soldatova, Liuba, MD  famotidine  (PEPCID ) 40 MG tablet TAKE 1 TABLET(40 MG) BY MOUTH AT BEDTIME 01/25/24  Yes Kerman Vina CHRISTELLA, NP  fluticasone   (FLONASE ) 50 MCG/ACT nasal spray SHAKE LIQUID AND USE 2 SPRAYS IN EACH NOSTRIL TWICE DAILY 12/06/23  Yes Soldatova, Liuba, MD  levothyroxine  (SYNTHROID ) 200 MCG tablet TAKE 1 TABLET(200 MCG) BY MOUTH DAILY BEFORE BREAKFAST 04/29/24  Yes [name], NP  Nutritional Supplements (GLUCOSAMINE COMPLEX PO) Take by mouth.   Yes [provider]  pantoprazole  (PROTONIX ) 40 MG tablet TAKE 1 TABLET(40 MG) BY MOUTH TWICE DAILY 12/21/23  Yes [name], NP  sacubitril -valsartan  (ENTRESTO ) 49-51 MG Take 1 tablet by mouth 2 (two) times daily. 02/19/24  Yes Delford Maude BROCKS, MD  TRELEGY ELLIPTA  100-62.5-25 MCG/ACT AEPB INHALE 1 PUFF INTO THE LUNGS DAILY 01/16/24  Yes Alva, Rakesh V, MD  vitamin B-12 (CYANOCOBALAMIN ) 1000 MCG tablet Take 1,000 mcg by mouth. Once a week   Yes [provider]  VITAMIN D  PO Take 1 tablet by mouth daily.   Yes [provider]  predniSONE  (DELTASONE ) 10 MG tablet Take 4 tabs  daily with food x 4 days, then 3 tabs daily x 4 days, then 2 tabs daily x 4 days, then 1 tab daily x4 days then stop. #40 Patient not taking: Reported on 02/13/2024 01/01/24   Alva, Rakesh V, MD    Allergies: Doxycycline    Review of Systems  Constitutional:  Negative for chills and fever.  Respiratory:  Positive for chest tightness and shortness of breath.  Cardiovascular:  Negative for chest pain and palpitations.  Gastrointestinal:  Positive for nausea. Negative for abdominal pain, diarrhea and vomiting.  Genitourinary:  Negative for decreased urine volume.    Updated Vital Signs BP (!) 140/60   Pulse (!) 57   Temp 98.4 F (36.9 C) (Oral)   Resp 14   Ht 6' (1.829 m)   Wt 104.8 kg   SpO2 97%   BMI 31.33 kg/m   Physical Exam Constitutional:      General: He is not in acute distress.    Appearance: He is well-developed.  HENT:     Head: Normocephalic and atraumatic.  Eyes:     Extraocular Movements: Extraocular movements intact.  Cardiovascular:     Rate and  Rhythm: Normal rate and regular rhythm.  Pulmonary:     Effort: Pulmonary effort is normal. No tachypnea or respiratory distress.     Breath sounds: Normal breath sounds. No decreased breath sounds, wheezing, rhonchi or rales.  Chest:     Chest wall: No tenderness.  Abdominal:     General: Bowel sounds are normal.     Palpations: Abdomen is soft.  Skin:    General: Skin is warm and dry.  Neurological:     Mental Status: He is alert.     (all labs ordered are listed, but only abnormal results are displayed) Labs Reviewed  CBC - Abnormal; Notable for the following components:      Result Value   Hemoglobin 12.2 (*)    HCT 38.6 (*)    All other components within normal limits  BASIC METABOLIC PANEL WITH GFR - Abnormal; Notable for the following components:   Glucose, Bld 113 (*)    Creatinine, Ser 1.62 (*)    GFR, Estimated 42 (*)    All other components within normal limits  CK - Abnormal; Notable for the following components:   Total CK 43 (*)    All other components within normal limits  URINALYSIS, ROUTINE W REFLEX MICROSCOPIC    EKG: EKG Interpretation Date/Time:  Thursday May 02 2024 12:51:44 EDT Ventricular Rate:  64 PR Interval:  320 QRS Duration:  133 QT Interval:  447 QTC Calculation: 462 R Axis:   62  Text Interpretation: Sinus rhythm Prolonged PR interval Left bundle branch block when compared to prior, similar appearance with now upright t wave in lead 3. NO STEMI Confirmed by Ginger Barefoot ([id number]) on 05/02/2024 12:57:02 PM  Radiology: DG Chest Portable 1 View Result Date: 05/02/2024 CLINICAL DATA:  SOB EXAM: PORTABLE CHEST - 1 VIEW COMPARISON:  November 07, 2023 FINDINGS: No focal airspace consolidation, pleural effusion, or pneumothorax. The cardiac silhouette is at the upper limits of normal, likely accentuated by AP technique and low lung volumes. Tortuous aorta with aortic atherosclerosis. No acute fracture or destructive lesions. Multilevel thoracic  osteophytosis. Partially visualized cervical fusion hardware and surgical clips in the neck. IMPRESSION: No acute cardiopulmonary abnormality. Electronically Signed   By: Rogelia Myers M.D.   On: 05/02/2024 14:54    Medications Ordered in the ED  lactated ringers  bolus 1,000 mL (1,000 mLs Intravenous New Bag/Given 05/02/24 1303)    Medical Decision Making Amount and/or Complexity of Data Reviewed Labs: ordered. Radiology: ordered.   Dehydration Pre-Syncope Hypotension An 83yo male presents today after a presyncopal episode while mowing the lawn. His blood pressure on arrival was 85/59. Suspect dehydration due to heat, poor oral hydration, and increased diarrhea. Will replete with LR and obtain labs.  After 1L  LR bolus, pt normotensive and asymptomatic. BMP was significant for AKI with Cr of 1.62. Suspect Pre-renal etiology and volume depletion. Will check UA to rule out infectious cause.  If UA and orthostatic vitals stable, pt stable for discharge home.    Final diagnoses:  Postural dizziness with presyncope  Dehydration    ED Discharge Orders     None          Myrna Bitters, DO 05/02/24 1600    Tegeler, Lonni PARAS, MD 05/03/24 [phone]

## 2024-05-02 NOTE — Discharge Instructions (Signed)
 Your workup today was reassuring.  Please follow-up with your doctor and return to the ER for worsening symptoms.

## 2024-05-02 NOTE — Telephone Encounter (Signed)
 FYI Only or Action Required?: Action required by provider: update on patient condition.  Patient was last seen in primary care on 01/11/2024 by Wendee Lynwood HERO, NP.  Called Nurse Triage reporting Hypotension.  Symptoms began today.  Interventions attempted: Nothing.  Symptoms are: gradually worsening.  Triage Disposition: Go to ED Now (Notify PCP)  Patient/caregiver understands and will follow disposition?: YesCopied from CRM [id number]. Topic: Clinical - Red Word Triage >> May 02, 2024 11:27 AM Arthur [name] wrote: Red Word that prompted transfer to Nurse Triage: Dizziness, 86/48 blood pressure, trouble breathing Reason for Disposition  [1] Systolic BP < 80 AND [2] NOT feeling weak or lightheaded  Answer Assessment - Initial Assessment Questions 1. BLOOD PRESSURE: What is your blood pressure? Did you take at least two measurements 5 minutes apart?     86/48; 78/51 2. ONSET: When did you take your blood pressure?     15 mins 3. HOW: How did you take your blood pressure? (e.g., visiting nurse, automatic home BP monitor)     automatic 4. HISTORY: Do you have a history of low blood pressure? What is your blood pressure normally?     no 5. MEDICINES: Are you taking any medicines for blood pressure? If Yes, ask: Have they been changed recently?     Coreg  6. PULSE RATE: Do you know what your pulse rate is?      not 7. OTHER SYMPTOMS: Have you been sick recently? Have you had a recent injury?     Dizzy and SOB    Pt was out mowing yard and had to come inside. Pt feels like he is going to pass out. 911 called. RN stayed with pt  until EMS arrived.  Protocols used: Blood Pressure - Low-A-AH

## 2024-05-02 NOTE — Telephone Encounter (Signed)
Agree with immediate evaluation in the ED.

## 2024-05-02 NOTE — ED Provider Notes (Signed)
 I received the patient in signout.  Plan is for discharge.  Waiting on urinalysis to determine need for antibiotics.  Physical Exam  BP (!) 156/74   Pulse 65   Temp 98.1 F (36.7 C) (Oral)   Resp 13   Ht 6' (1.829 m)   Wt 104.8 kg   SpO2 98%   BMI 31.33 kg/m   Physical Exam General: No acute distress Procedures  Procedures  ED Course / MDM    Medical Decision Making Amount and/or Complexity of Data Reviewed Labs: ordered. Radiology: ordered.   The patient did have some bacteria in his urine but was negative for leukocytes or nitrites and there is no pyuria.  Will be discharged with return precautions.       Ula Prentice SAUNDERS, MD 05/02/24 [phone]

## 2024-05-08 ENCOUNTER — Other Ambulatory Visit: Payer: Self-pay | Admitting: Nurse Practitioner

## 2024-05-08 DIAGNOSIS — R09A2 Foreign body sensation, throat: Secondary | ICD-10-CM

## 2024-06-06 ENCOUNTER — Other Ambulatory Visit: Payer: Self-pay | Admitting: Nurse Practitioner

## 2024-06-12 ENCOUNTER — Ambulatory Visit: Payer: Self-pay

## 2024-06-12 NOTE — Telephone Encounter (Signed)
 Noted. I appreciate Dr. Verdene evaluation

## 2024-06-12 NOTE — Telephone Encounter (Signed)
 FYI Only or Action Required?: Action required by provider: pt refused urgent care/ED.  Patient was last seen in primary care on 01/11/2024 by Wendee Lynwood HERO, NP.  Called Nurse Triage reporting Shortness of Breath.  Symptoms began Saturday and worsening.  Interventions attempted: OTC medications: allergy medication.  Symptoms are: gradually worsening.  Triage Disposition: Go to ED Now (Notify PCP), See HCP Within 4 Hours (Or PCP Triage)  Patient/caregiver understands and will follow disposition?: No, refuses disposition     Message from [place] C sent at 06/12/2024  3:29 PM EDT  Patient called in regarding congestion, patient stated he has been coughing a lot ,did scheduled a appointment for tomorrow, but wanted to know if something could be called in or if needs to be doing something regarding  this   Reason for Disposition  [1] MILD difficulty breathing (e.g., minimal/no SOB at rest, SOB with walking, pulse < 100) AND [2] NEW-onset or WORSE than normal  [1] MODERATE difficulty breathing (e.g., speaks in phrases, SOB even at rest, pulse 100-120) AND [2] NEW-onset or WORSE than normal  Answer Assessment - Initial Assessment Questions 1. RESPIRATORY STATUS: Describe your breathing? (e.g., wheezing, shortness of breath, unable to speak, severe coughing)      SOB- pt having to take a breath in b/w words, moderate to severe cough 2. ONSET: When did this breathing problem begin?      Saturday and worsening today 3. PATTERN Does the difficult breathing come and go, or has it been constant since it started?      constant 4. SEVERITY: How bad is your breathing? (e.g., mild, moderate, severe)      moderate 5. RECURRENT SYMPTOM: Have you had difficulty breathing before? If Yes, ask: When was the last time? and What happened that time?      no 6. CARDIAC HISTORY: Do you have any history of heart disease? (e.g., heart attack, angina, bypass surgery, angioplasty)      na 7.  LUNG HISTORY: Do you have any history of lung disease?  (e.g., pulmonary embolus, asthma, emphysema)     COPD 8. CAUSE: What do you think is causing the breathing problem?      nana 9. OTHER SYMPTOMS: Do you have any other symptoms? (e.g., chest pain, cough, dizziness, fever, runny nose)     Cough, congestion, mucous - clear 10. O2 SATURATION MONITOR:  Do you use an oxygen saturation monitor (pulse oximeter) at home? If Yes, ask: What is your reading (oxygen level) today? What is your usual oxygen saturation reading? (e.g., 95%)       na 11. PREGNANCY: Is there any chance you are pregnant? When was your last menstrual period?       na 12. TRAVEL: Have you traveled out of the country in the last month? (e.g., travel history, exposures)       Na Pt initially called in to hopefully move his appt for tomorrow to today: no available openings: referred pt to urgent care & offered to schedule appt: pt refused  Taking allergy medications: pt stated was getting better but now it is worse again. Referred pt to urgent care and offered to schedule appt: pt refused stating I will be fine until tomorrow afternoon.  Pt also stated his SOB started off as only w/exertion & now all the time: pt not able to get up and do hardly anything around the house r/t SOB. Informed if SOB continues or gets worse to go to urgent care or ED: pt  verbalized understanding.  Protocols used: Breathing Difficulty-A-AH

## 2024-06-12 NOTE — Telephone Encounter (Signed)
 Called [facility] to report refusal of urgent care  and/or ED.  Spoke with Emmie- [facility]

## 2024-06-13 ENCOUNTER — Ambulatory Visit (INDEPENDENT_AMBULATORY_CARE_PROVIDER_SITE_OTHER): Admitting: Family Medicine

## 2024-06-13 ENCOUNTER — Encounter: Payer: Self-pay | Admitting: Family Medicine

## 2024-06-13 VITALS — BP 128/68 | HR 63 | Temp 98.4°F | Ht 72.0 in | Wt 241.6 lb

## 2024-06-13 DIAGNOSIS — B349 Viral infection, unspecified: Secondary | ICD-10-CM

## 2024-06-13 NOTE — Progress Notes (Unsigned)
 Sx started about 3 days ago.  He felt unwell yesterday.  Cough with sputum.  Sputum wasn't bloody.  It was clear.  Much less sputum today.   He had a sweat this AM, about 2 AM and felt better after that.  Vomited this AM from coughing.  He can get a deep breath now.  Still with some cough.  Some wheeze.  Still using his inhalers at baseline.  SABA use helped.  He had a possible covid exposure.  HA, ST prev.  He clearly feels better at the office visit than he did yesterday.  Meds, vitals, and allergies reviewed.   ROS: Per HPI unless specifically indicated in ROS section   GEN: nad, alert and oriented HEENT: mucous membranes moist, tm w/o erythema, nasal exam w/o erythema, clear discharge noted,  OP with cobblestoning NECK: supple w/o LA CV: rrr.   PULM: ctab, no inc wob EXT: no edema SKIN: Well-perfused.

## 2024-06-13 NOTE — Patient Instructions (Signed)
 Your lungs sound clear.  Keep using your inhalers and update us  as needed.  Take care.  Glad to see you.

## 2024-06-16 DIAGNOSIS — B349 Viral infection, unspecified: Secondary | ICD-10-CM | POA: Insufficient documentation

## 2024-06-16 NOTE — Assessment & Plan Note (Signed)
 His lungs are clear and he clearly feels better at the office visit.  He is likely already improving.  Discussed options.  Additional testing at this point would likely not change the plan.  Continue supportive care, continue inhaler use.  He will update us  as needed.  He agrees with plan.

## 2024-06-18 ENCOUNTER — Other Ambulatory Visit: Payer: Self-pay | Admitting: Adult Health

## 2024-06-28 ENCOUNTER — Ambulatory Visit (HOSPITAL_BASED_OUTPATIENT_CLINIC_OR_DEPARTMENT_OTHER): Admitting: Pulmonary Disease

## 2024-07-07 ENCOUNTER — Other Ambulatory Visit (INDEPENDENT_AMBULATORY_CARE_PROVIDER_SITE_OTHER): Payer: Self-pay | Admitting: Otolaryngology

## 2024-07-12 ENCOUNTER — Ambulatory Visit: Admitting: Nurse Practitioner

## 2024-07-15 ENCOUNTER — Ambulatory Visit: Admitting: Nurse Practitioner

## 2024-07-22 ENCOUNTER — Other Ambulatory Visit (HOSPITAL_BASED_OUTPATIENT_CLINIC_OR_DEPARTMENT_OTHER): Payer: Self-pay | Admitting: Pulmonary Disease

## 2024-08-05 ENCOUNTER — Other Ambulatory Visit: Payer: Self-pay | Admitting: Nurse Practitioner

## 2024-08-05 DIAGNOSIS — R09A2 Foreign body sensation, throat: Secondary | ICD-10-CM

## 2024-08-08 ENCOUNTER — Ambulatory Visit (INDEPENDENT_AMBULATORY_CARE_PROVIDER_SITE_OTHER): Admitting: Nurse Practitioner

## 2024-08-08 VITALS — BP 138/78 | HR 52 | Temp 98.2°F | Ht 72.0 in | Wt 241.4 lb

## 2024-08-08 DIAGNOSIS — N289 Disorder of kidney and ureter, unspecified: Secondary | ICD-10-CM

## 2024-08-08 DIAGNOSIS — I10 Essential (primary) hypertension: Secondary | ICD-10-CM | POA: Diagnosis not present

## 2024-08-08 DIAGNOSIS — R10A2 Flank pain, left side: Secondary | ICD-10-CM

## 2024-08-08 DIAGNOSIS — R202 Paresthesia of skin: Secondary | ICD-10-CM | POA: Diagnosis not present

## 2024-08-08 LAB — BASIC METABOLIC PANEL WITH GFR
BUN: 12 mg/dL (ref 6–23)
CO2: 35 meq/L — ABNORMAL HIGH (ref 19–32)
Calcium: 9.3 mg/dL (ref 8.4–10.5)
Chloride: 101 meq/L (ref 96–112)
Creatinine, Ser: 0.94 mg/dL (ref 0.40–1.50)
GFR: 75.74 mL/min (ref >60.00)
Glucose, Bld: 101 mg/dL — ABNORMAL HIGH (ref 70–99)
Potassium: 4.5 meq/L (ref 3.5–5.1)
Sodium: 143 meq/L (ref 135–145)

## 2024-08-08 LAB — TSH: TSH: 1.49 u[IU]/mL (ref 0.35–5.50)

## 2024-08-08 LAB — VITAMIN B12: Vitamin B-12: 346 pg/mL (ref 211–911)

## 2024-08-08 NOTE — Patient Instructions (Signed)
Nice to see you today I will be in touch with the labs once I have them Follow up with me in 6 months for your physical, sooner if you need me 

## 2024-08-08 NOTE — Progress Notes (Signed)
 Established Patient Office Visit  Subjective   Patient ID: Arthur [name]., male    DOB: [birth year]  Age: 83 y.o. MRN: [id number]  Chief Complaint  Patient presents with   Follow-up    HPI  Discussed the use of AI scribe software for clinical note transcription with the patient, who gave verbal consent to proceed.  History of Present Illness Arthur [name]. is an 83 year old male with hypertension who presents for blood pressure management and evaluation of kidney pain.  His blood pressure was elevated last week at 180/110 mmHg, causing him to feel unwell and cancel his activities for the day. Previously, his blood pressure was stable during a visit with another provider in September and is currently stable today. No dizziness or lightheadedness is reported, and he continues to engage in activities such as dancing and racing, although the racing season has ended due to a mechanical issue with his car.  He has a history of being hospitalized in July for dehydration, which caused his blood pressure to spike and resulted in dizziness and blurred vision. He describes a recurring dull ache in his left side, which he experiences about once a week, often at night, and sometimes when he is driving. This pain occurs about once a week, often at night, and sometimes when he is driving. No numbness, tingling, or new weakness in his legs, although he notes general leg weakness. He reports urinating normally without noticing any blood in his urine. He has not experienced any fever, chills, or abdominal pain associated with this issue.  He describes a burning sensation in his feet, particularly behind his toes, which occurs more frequently at night. He experiences similar sensations in his fingers occasionally. The burning is described as feeling like a 'fire torch' or 'match stuck to a burn.' He has not tried any medication for this burning sensation and denies any fever or chills accompanying it.  He mentions wearing insoles in his shoes to make them more comfortable.  He has received both the flu and COVID vaccinations recently. He denies any significant respiratory issues recently, although he notes that hot weather in the summer tends to exacerbate his breathing difficulties.     Review of Systems  Constitutional:  Negative for chills and fever.  Respiratory:  Negative for shortness of breath.   Cardiovascular:  Negative for chest pain.  Neurological:  Positive for tingling. Negative for dizziness and headaches.  Psychiatric/Behavioral:  Negative for hallucinations and suicidal ideas.       Objective:     BP 138/78   Pulse (!) 52   Temp 98.2 F (36.8 C) (Oral)   Ht 6' (1.829 m)   Wt 241 lb 6.4 oz (109.5 kg)   SpO2 96%   BMI 32.74 kg/m  BP Readings from Last 3 Encounters:  08/08/24 138/78  06/13/24 128/68  05/02/24 (!) 130/113   Wt Readings from Last 3 Encounters:  08/08/24 241 lb 6.4 oz (109.5 kg)  06/13/24 241 lb 9.6 oz (109.6 kg)  05/02/24 231 lb (104.8 kg)   SpO2 Readings from Last 3 Encounters:  08/08/24 96%  06/13/24 93%  05/02/24 99%      Physical Exam Vitals and nursing note reviewed.  Constitutional:      Appearance: Normal appearance.  Cardiovascular:     Rate and Rhythm: Normal rate and regular rhythm.     Pulses:          Dorsalis pedis pulses are 2+ on  the right side and 2+ on the left side.       Posterior tibial pulses are 2+ on the right side and 2+ on the left side.     Heart sounds: Normal heart sounds.  Pulmonary:     Effort: Pulmonary effort is normal.     Breath sounds: Normal breath sounds.  Abdominal:     General: Bowel sounds are normal.     Tenderness: There is no abdominal tenderness. There is left CVA tenderness. There is no right CVA tenderness.  Musculoskeletal:        General: Tenderness present.       Arms:     Lumbar back: Negative right straight leg raise test and negative left straight leg raise test.        Feet:  Feet:     Right foot:     Skin integrity: Skin integrity normal.     Left foot:     Skin integrity: Skin integrity normal.  Neurological:     Mental Status: He is alert.     Deep Tendon Reflexes:     Reflex Scores:      Patellar reflexes are 2+ on the right side and 2+ on the left side.    Comments: Bilateral lower extremity strength 5/5      No results found for any visits on 08/08/24.    The ASCVD Risk score (Arnett DK, et al., 2019) failed to calculate for the following reasons:   The 2019 ASCVD risk score is only valid for ages 24 to 79    Assessment & Plan:   Problem List Items Addressed This Visit       Cardiovascular and Mediastinum   Essential hypertension   Relevant Orders   Basic metabolic panel with GFR   TSH   Other Visit Diagnoses       Paresthesia    -  Primary   Relevant Orders   Vitamin B12   Basic metabolic panel with GFR   TSH     Decreased renal function       Relevant Orders   Basic metabolic panel with GFR     Left flank pain       Relevant Orders   Urinalysis w microscopic + reflex cultur      Assessment and Plan Assessment & Plan Suspected nephrolithiasis with left-sided flank pain Intermittent left flank pain, possibly nephrolithiasis. - Ordered urinalysis for hematuria. - Ordered blood tests for kidney function. - Consider CT scan if indicated.  Peripheral neuropathy (burning pain in feet and fingers) Burning sensation in extremities, likely peripheral neuropathy. Possible B12 deficiency. - Ordered blood tests for B12 levels. - Prescribed gabapentin at bedtime.  General Health Maintenance Flu and COVID vaccinations received. Weight stable.  Hypertension - Maintained on carvedilol  6.25 mg twice daily and Entresto  49-51 mg twice daily blood pressure controlled continue medication as prescribed follow-up with cardiology as recommended  Return in about 6 months (around 02/05/2025) for CPE and Labs.    Adina Crandall,  NP

## 2024-08-09 LAB — URINALYSIS W MICROSCOPIC + REFLEX CULTURE
Bacteria, UA: NONE SEEN /HPF
Bilirubin Urine: NEGATIVE
Glucose, UA: NEGATIVE
Hgb urine dipstick: NEGATIVE
Hyaline Cast: NONE SEEN /LPF
Ketones, ur: NEGATIVE
Leukocyte Esterase: NEGATIVE
Nitrites, Initial: NEGATIVE
RBC / HPF: NONE SEEN /HPF (ref 0–2)
Specific Gravity, Urine: 1.017 (ref 1.001–1.035)
Squamous Epithelial / HPF: NONE SEEN /HPF (ref <=5)
WBC, UA: NONE SEEN /HPF (ref 0–5)
pH: 6 (ref 5.0–8.0)

## 2024-08-09 LAB — NO CULTURE INDICATED

## 2024-08-12 ENCOUNTER — Ambulatory Visit: Payer: Self-pay | Admitting: Nurse Practitioner

## 2024-08-12 ENCOUNTER — Telehealth: Payer: Self-pay | Admitting: *Deleted

## 2024-08-12 NOTE — Telephone Encounter (Signed)
 Released results with comments via mychart

## 2024-08-12 NOTE — Telephone Encounter (Signed)
 Copied from CRM [id number]. Topic: Clinical - Lab/Test Results >> Aug 12, 2024  9:19 AM Burnard DEL wrote: Reason for CRM: Patient called to check on the status of his lab results. I informed him that when provider reviews them,office would be in contact to discuss. Patient would also like a copy mailed to his home.

## 2024-08-13 NOTE — Telephone Encounter (Signed)
Mailed copy to pt address on file.

## 2024-08-15 ENCOUNTER — Ambulatory Visit (INDEPENDENT_AMBULATORY_CARE_PROVIDER_SITE_OTHER): Admitting: Pulmonary Disease

## 2024-08-15 ENCOUNTER — Encounter (HOSPITAL_BASED_OUTPATIENT_CLINIC_OR_DEPARTMENT_OTHER): Payer: Self-pay | Admitting: Pulmonary Disease

## 2024-08-15 VITALS — BP 106/67 | HR 70 | Ht 72.0 in | Wt 239.1 lb

## 2024-08-15 DIAGNOSIS — R490 Dysphonia: Secondary | ICD-10-CM | POA: Diagnosis not present

## 2024-08-15 DIAGNOSIS — I10 Essential (primary) hypertension: Secondary | ICD-10-CM | POA: Diagnosis not present

## 2024-08-15 DIAGNOSIS — J4489 Other specified chronic obstructive pulmonary disease: Secondary | ICD-10-CM | POA: Diagnosis not present

## 2024-08-15 NOTE — Progress Notes (Signed)
 Subjective:    Patient ID: Arthur [name]., male    DOB: 09-21-1941, 83 y.o.   MRN: [id number]   83 yo smoker , followed for COPD and stable pulmonary nodules   PMH -HFrEF (EF 35 % ) , recovered He races modified cars as a hobby   12/27/2021-01/02/2022: Hospitalization for AECOPD, failed outpatient treatment. Treated with IV steroids and PO levaquin . Initially required O2 therapy but was weaned.     Discussed the use of AI scribe software for clinical note transcription with the patient, who gave verbal consent to proceed.  History of Present Illness Arthur [name]. is an 83 year old male who presents for follow-up of his breathing difficulties.  Breathing is generally stable, with no flare-ups since March. Hot weather is intolerable, causing dizziness and a sensation of being 'drunk', which previously led to hospital visits. Cooler weather is more comfortable. He uses Trelegy and rinses his mouth after use.  Blood pressure remains stable. Fluid accumulates during the day but resolves at night. No recent breathing-related issues or heart problems. No fluid buildup and no use of diuretics. Dizziness and low blood pressure occur when overheated.  He has received flu, pneumonia, RSV, and COVID vaccinations, with persistent soreness in the arm from the shots.     Significant tests/ events reviewed   2D Echo 05/2016 Moderate to severe global reduction in LV function; mild LVH;   grade 1 diastolic dysfunction with elevated LV filling pressure;   mild biatrial enlargement; trace AI, MR and TR. EF 30-35%   3/2022EF improved to 52%    Spirometry 2016 >FEV1 66% , ratio 63.      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    Review of Systems  neg for any significant sore throat, dysphagia, itching, sneezing, nasal congestion or excess/ purulent secretions, fever, chills, sweats, unintended wt loss, pleuritic or exertional cp, hempoptysis, orthopnea pnd or change in chronic leg swelling. Also denies presyncope, palpitations, heartburn, abdominal pain, nausea, vomiting, diarrhea or change in bowel or urinary habits, dysuria,hematuria, rash, arthralgias, visual complaints, headache, numbness weakness or ataxia.      Objective:   Physical Exam  Gen. Pleasant, well-nourished, in no distress ENT - no thrush, no pallor/icterus,no post nasal drip Neck: No JVD, no thyromegaly, no carotid bruits Lungs: no use of accessory muscles, no dullness to percussion, clear without rales or rhonchi  Cardiovascular: Rhythm regular, heart sounds  normal, no murmurs or gallops, no peripheral edema Musculoskeletal: No deformities, no cyanosis or clubbing        Assessment & Plan:   Assessment and Plan Assessment & Plan Chronic obstructive pulmonary disease (COPD) COPD is well-managed with Trelegy. No exacerbations since the last visit. Breathing is generally good, but worsens in hot weather. No fluid buildup during the day, which resolves overnight. - Continue Trelegy as prescribed - Ensure adequate hydration, especially in hot weather  Hypertension Blood pressure is well-controlled. No fluid retention issues reported. Previous episodes of dizziness and hypotension likely due to inadequate fluid intake and electrolyte  imbalance. - Encouraged increased fluid intake, including water and electrolyte solutions like Gatorade  Dysphonia Intermittent dysphonia with episodes of inability to speak. No specific cause identified during the visit. - Continue rinsing mouth after  Trelegy use

## 2024-09-19 ENCOUNTER — Other Ambulatory Visit: Payer: Self-pay | Admitting: Cardiovascular Disease

## 2024-09-19 DIAGNOSIS — I10 Essential (primary) hypertension: Secondary | ICD-10-CM

## 2024-12-20 ENCOUNTER — Ambulatory Visit: Admitting: Emergency Medicine

## 2025-02-13 ENCOUNTER — Ambulatory Visit

## 2025-02-21 ENCOUNTER — Ambulatory Visit
